# Patient Record
Sex: Female | Born: 1955 | Race: White | Hispanic: No | Marital: Married | Smoking: Never smoker
Health system: Southern US, Community
[De-identification: ages and names within clinical notes are randomized; demographics above are authoritative.]

## PROBLEM LIST (undated history)

## (undated) DIAGNOSIS — Z803 Family history of malignant neoplasm of breast: Secondary | ICD-10-CM

## (undated) DIAGNOSIS — C801 Malignant (primary) neoplasm, unspecified: Secondary | ICD-10-CM

## (undated) DIAGNOSIS — Z85850 Personal history of malignant neoplasm of thyroid: Secondary | ICD-10-CM

## (undated) DIAGNOSIS — E039 Hypothyroidism, unspecified: Secondary | ICD-10-CM

## (undated) DIAGNOSIS — G4700 Insomnia, unspecified: Secondary | ICD-10-CM

## (undated) DIAGNOSIS — E785 Hyperlipidemia, unspecified: Secondary | ICD-10-CM

## (undated) DIAGNOSIS — Z801 Family history of malignant neoplasm of trachea, bronchus and lung: Secondary | ICD-10-CM

## (undated) HISTORY — DX: Family history of malignant neoplasm of breast: Z80.3

## (undated) HISTORY — DX: Insomnia, unspecified: G47.00

## (undated) HISTORY — DX: Personal history of malignant neoplasm of thyroid: Z85.850

## (undated) HISTORY — DX: Hypothyroidism, unspecified: E03.9

## (undated) HISTORY — PX: THYROIDECTOMY, PARTIAL: SHX18

## (undated) HISTORY — DX: Family history of malignant neoplasm of trachea, bronchus and lung: Z80.1

## (undated) HISTORY — DX: Hyperlipidemia, unspecified: E78.5

---

## 1998-08-20 ENCOUNTER — Other Ambulatory Visit: Admission: RE | Admit: 1998-08-20 | Discharge: 1998-08-20 | Payer: Self-pay | Admitting: Obstetrics and Gynecology

## 1998-12-09 ENCOUNTER — Encounter: Payer: Self-pay | Admitting: Family Medicine

## 1998-12-09 ENCOUNTER — Ambulatory Visit (HOSPITAL_COMMUNITY): Admission: RE | Admit: 1998-12-09 | Discharge: 1998-12-09 | Payer: Self-pay | Admitting: Family Medicine

## 2000-02-26 ENCOUNTER — Other Ambulatory Visit: Admission: RE | Admit: 2000-02-26 | Discharge: 2000-02-26 | Payer: Self-pay | Admitting: Obstetrics and Gynecology

## 2001-05-19 ENCOUNTER — Other Ambulatory Visit: Admission: RE | Admit: 2001-05-19 | Discharge: 2001-05-19 | Payer: Self-pay | Admitting: Obstetrics and Gynecology

## 2002-08-21 ENCOUNTER — Other Ambulatory Visit: Admission: RE | Admit: 2002-08-21 | Discharge: 2002-08-21 | Payer: Self-pay | Admitting: Obstetrics and Gynecology

## 2003-12-20 ENCOUNTER — Other Ambulatory Visit: Admission: RE | Admit: 2003-12-20 | Discharge: 2003-12-20 | Payer: Self-pay | Admitting: Obstetrics and Gynecology

## 2004-09-02 ENCOUNTER — Ambulatory Visit: Payer: Self-pay | Admitting: Internal Medicine

## 2004-09-08 ENCOUNTER — Ambulatory Visit: Payer: Self-pay | Admitting: Internal Medicine

## 2005-02-23 ENCOUNTER — Ambulatory Visit: Payer: Self-pay | Admitting: Internal Medicine

## 2005-03-02 ENCOUNTER — Ambulatory Visit: Payer: Self-pay | Admitting: Internal Medicine

## 2005-07-29 ENCOUNTER — Other Ambulatory Visit: Admission: RE | Admit: 2005-07-29 | Discharge: 2005-07-29 | Payer: Self-pay | Admitting: Obstetrics and Gynecology

## 2005-12-02 ENCOUNTER — Ambulatory Visit: Payer: Self-pay | Admitting: Internal Medicine

## 2007-04-27 ENCOUNTER — Ambulatory Visit: Payer: Self-pay | Admitting: Internal Medicine

## 2007-04-27 LAB — CONVERTED CEMR LAB
ALT: 16 units/L (ref 0–35)
AST: 18 units/L (ref 0–37)
Albumin: 3.8 g/dL (ref 3.5–5.2)
Alkaline Phosphatase: 48 units/L (ref 39–117)
BUN: 8 mg/dL (ref 6–23)
Basophils Absolute: 0.1 10*3/uL (ref 0.0–0.1)
Basophils Relative: 1.4 % — ABNORMAL HIGH (ref 0.0–1.0)
Bilirubin, Direct: 0.1 mg/dL (ref 0.0–0.3)
CO2: 29 meq/L (ref 19–32)
Calcium: 9.3 mg/dL (ref 8.4–10.5)
Chloride: 106 meq/L (ref 96–112)
Cholesterol: 233 mg/dL (ref 0–200)
Creatinine, Ser: 0.7 mg/dL (ref 0.4–1.2)
Direct LDL: 164.5 mg/dL
Eosinophils Absolute: 0.2 10*3/uL (ref 0.0–0.6)
Eosinophils Relative: 3.7 % (ref 0.0–5.0)
GFR calc Af Amer: 114 mL/min
GFR calc non Af Amer: 95 mL/min
Glucose, Bld: 103 mg/dL — ABNORMAL HIGH (ref 70–99)
HCT: 40.8 % (ref 36.0–46.0)
HDL: 52.7 mg/dL (ref >39.0)
Hemoglobin: 14.3 g/dL (ref 12.0–15.0)
Lymphocytes Relative: 25.7 % (ref 12.0–46.0)
MCHC: 35 g/dL (ref 30.0–36.0)
MCV: 90.2 fL (ref 78.0–100.0)
Monocytes Absolute: 0.4 10*3/uL (ref 0.2–0.7)
Monocytes Relative: 6 % (ref 3.0–11.0)
Neutro Abs: 3.8 10*3/uL (ref 1.4–7.7)
Neutrophils Relative %: 63.2 % (ref 43.0–77.0)
Platelets: 211 10*3/uL (ref 150–400)
Potassium: 4.1 meq/L (ref 3.5–5.1)
RBC: 4.52 M/uL (ref 3.87–5.11)
RDW: 12.6 % (ref 11.5–14.6)
Sodium: 140 meq/L (ref 135–145)
TSH: 1.08 microintl units/mL (ref 0.35–5.50)
Total Bilirubin: 0.9 mg/dL (ref 0.3–1.2)
Total CHOL/HDL Ratio: 4.4
Total Protein: 7.2 g/dL (ref 6.0–8.3)
Triglycerides: 85 mg/dL (ref 0–149)
VLDL: 17 mg/dL (ref 0–40)
WBC: 6 10*3/uL (ref 4.5–10.5)

## 2007-05-02 ENCOUNTER — Ambulatory Visit: Payer: Self-pay | Admitting: Internal Medicine

## 2008-04-09 ENCOUNTER — Ambulatory Visit: Payer: Self-pay | Admitting: Internal Medicine

## 2008-04-09 DIAGNOSIS — E785 Hyperlipidemia, unspecified: Secondary | ICD-10-CM | POA: Insufficient documentation

## 2008-04-09 DIAGNOSIS — J329 Chronic sinusitis, unspecified: Secondary | ICD-10-CM | POA: Insufficient documentation

## 2008-04-09 DIAGNOSIS — E039 Hypothyroidism, unspecified: Secondary | ICD-10-CM | POA: Insufficient documentation

## 2008-04-09 HISTORY — DX: Hyperlipidemia, unspecified: E78.5

## 2008-04-09 HISTORY — DX: Hypothyroidism, unspecified: E03.9

## 2008-04-16 ENCOUNTER — Encounter: Payer: Self-pay | Admitting: Internal Medicine

## 2008-04-16 ENCOUNTER — Telehealth: Payer: Self-pay | Admitting: Internal Medicine

## 2008-04-16 ENCOUNTER — Ambulatory Visit: Payer: Self-pay | Admitting: Internal Medicine

## 2008-04-16 ENCOUNTER — Ambulatory Visit (HOSPITAL_BASED_OUTPATIENT_CLINIC_OR_DEPARTMENT_OTHER): Admission: RE | Admit: 2008-04-16 | Discharge: 2008-04-16 | Payer: Self-pay | Admitting: Internal Medicine

## 2008-04-16 DIAGNOSIS — R059 Cough, unspecified: Secondary | ICD-10-CM | POA: Insufficient documentation

## 2008-04-16 DIAGNOSIS — R05 Cough: Secondary | ICD-10-CM

## 2008-04-16 LAB — CONVERTED CEMR LAB
ALT: 18 units/L (ref 0–35)
AST: 27 units/L (ref 0–37)
Albumin: 4 g/dL (ref 3.5–5.2)
Alkaline Phosphatase: 52 units/L (ref 39–117)
BUN: 12 mg/dL (ref 6–23)
Basophils Absolute: 0.1 10*3/uL (ref 0.0–0.1)
Basophils Relative: 1.6 % (ref 0.0–3.0)
Bilirubin, Direct: 0.2 mg/dL (ref 0.0–0.3)
CO2: 31 meq/L (ref 19–32)
Calcium: 9.4 mg/dL (ref 8.4–10.5)
Chloride: 110 meq/L (ref 96–112)
Cholesterol: 258 mg/dL (ref 0–200)
Creatinine, Ser: 0.7 mg/dL (ref 0.4–1.2)
Direct LDL: 186.2 mg/dL
Eosinophils Absolute: 0.1 10*3/uL (ref 0.0–0.7)
Eosinophils Relative: 2.6 % (ref 0.0–5.0)
GFR calc Af Amer: 114 mL/min
GFR calc non Af Amer: 94 mL/min
Glucose, Bld: 106 mg/dL — ABNORMAL HIGH (ref 70–99)
HCT: 41.3 % (ref 36.0–46.0)
HDL: 45.9 mg/dL (ref >39.0)
Hemoglobin: 14.9 g/dL (ref 12.0–15.0)
Lymphocytes Relative: 21.8 % (ref 12.0–46.0)
MCHC: 36.1 g/dL — ABNORMAL HIGH (ref 30.0–36.0)
MCV: 91.3 fL (ref 78.0–100.0)
Monocytes Absolute: 0.3 10*3/uL (ref 0.1–1.0)
Monocytes Relative: 5.7 % (ref 3.0–12.0)
Neutro Abs: 4 10*3/uL (ref 1.4–7.7)
Neutrophils Relative %: 68.3 % (ref 43.0–77.0)
Platelets: 207 10*3/uL (ref 150–400)
Potassium: 4.6 meq/L (ref 3.5–5.1)
RBC: 4.52 M/uL (ref 3.87–5.11)
RDW: 11.9 % (ref 11.5–14.6)
Sodium: 142 meq/L (ref 135–145)
TSH: 0.92 microintl units/mL (ref 0.35–5.50)
Total Bilirubin: 1 mg/dL (ref 0.3–1.2)
Total CHOL/HDL Ratio: 5.6
Total Protein: 7.5 g/dL (ref 6.0–8.3)
Triglycerides: 120 mg/dL (ref 0–149)
VLDL: 24 mg/dL (ref 0–40)
WBC: 5.7 10*3/uL (ref 4.5–10.5)

## 2008-06-04 ENCOUNTER — Ambulatory Visit: Payer: Self-pay | Admitting: Gastroenterology

## 2008-06-19 ENCOUNTER — Ambulatory Visit: Payer: Self-pay | Admitting: Gastroenterology

## 2008-10-17 ENCOUNTER — Telehealth: Payer: Self-pay | Admitting: Internal Medicine

## 2009-04-19 ENCOUNTER — Telehealth: Payer: Self-pay | Admitting: Internal Medicine

## 2009-04-22 ENCOUNTER — Ambulatory Visit: Payer: Self-pay | Admitting: Internal Medicine

## 2009-04-22 LAB — CONVERTED CEMR LAB
ALT: 12 units/L (ref 0–35)
AST: 15 units/L (ref 0–37)
Albumin: 4.3 g/dL (ref 3.5–5.2)
Alkaline Phosphatase: 59 units/L (ref 39–117)
BUN: 14 mg/dL (ref 6–23)
Basophils Absolute: 0 10*3/uL (ref 0.0–0.1)
Basophils Relative: 0 % (ref 0–1)
Bilirubin, Direct: 0.1 mg/dL (ref 0.0–0.3)
CO2: 23 meq/L (ref 19–32)
CRP: 0 mg/dL (ref <0.6)
Calcium: 9.5 mg/dL (ref 8.4–10.5)
Chloride: 106 meq/L (ref 96–112)
Cholesterol: 209 mg/dL — ABNORMAL HIGH (ref 0–200)
Creatinine, Ser: 0.77 mg/dL (ref 0.40–1.20)
Eosinophils Absolute: 0.1 10*3/uL (ref 0.0–0.7)
Eosinophils Relative: 2 % (ref 0–5)
Glucose, Bld: 97 mg/dL (ref 70–99)
HCT: 43 % (ref 36.0–46.0)
HDL: 58 mg/dL (ref >39)
Hemoglobin: 13.9 g/dL (ref 12.0–15.0)
Indirect Bilirubin: 0.5 mg/dL (ref 0.0–0.9)
LDL Cholesterol: 135 mg/dL — ABNORMAL HIGH (ref 0–99)
Lymphocytes Relative: 28 % (ref 12–46)
Lymphs Abs: 1.5 10*3/uL (ref 0.7–4.0)
MCHC: 32.3 g/dL (ref 30.0–36.0)
MCV: 91.3 fL (ref 78.0–100.0)
Monocytes Absolute: 0.5 10*3/uL (ref 0.1–1.0)
Monocytes Relative: 9 % (ref 3–12)
Neutro Abs: 3.1 10*3/uL (ref 1.7–7.7)
Neutrophils Relative %: 61 % (ref 43–77)
Platelets: 163 10*3/uL (ref 150–400)
Potassium: 4.6 meq/L (ref 3.5–5.3)
RBC: 4.71 M/uL (ref 3.87–5.11)
RDW: 12.9 % (ref 11.5–15.5)
Sodium: 142 meq/L (ref 135–145)
TSH: 0.159 microintl units/mL — ABNORMAL LOW (ref 0.350–4.500)
Total Bilirubin: 0.6 mg/dL (ref 0.3–1.2)
Total CHOL/HDL Ratio: 3.6
Total Protein: 6.8 g/dL (ref 6.0–8.3)
Triglycerides: 82 mg/dL (ref <150)
VLDL: 16 mg/dL (ref 0–40)
Vit D, 1,25-Dihydroxy: 48 (ref 30–89)
WBC: 5.2 10*3/uL (ref 4.0–10.5)

## 2009-04-25 ENCOUNTER — Ambulatory Visit: Payer: Self-pay | Admitting: Internal Medicine

## 2009-07-24 ENCOUNTER — Ambulatory Visit: Payer: Self-pay | Admitting: Internal Medicine

## 2009-07-24 LAB — CONVERTED CEMR LAB: TSH: 0.546 microintl units/mL (ref 0.350–4.500)

## 2009-07-25 ENCOUNTER — Telehealth: Payer: Self-pay | Admitting: Internal Medicine

## 2009-08-05 ENCOUNTER — Encounter: Payer: Self-pay | Admitting: Internal Medicine

## 2009-08-05 LAB — CONVERTED CEMR LAB: Pap Smear: NORMAL

## 2010-01-21 ENCOUNTER — Encounter: Payer: Self-pay | Admitting: Internal Medicine

## 2010-01-22 ENCOUNTER — Encounter: Payer: Self-pay | Admitting: Internal Medicine

## 2010-01-22 LAB — CONVERTED CEMR LAB
Cholesterol: 235 mg/dL — ABNORMAL HIGH (ref 0–200)
HDL: 50 mg/dL (ref >39)
LDL Cholesterol: 168 mg/dL — ABNORMAL HIGH (ref 0–99)
TSH: 0.913 microintl units/mL (ref 0.350–4.500)
Total CHOL/HDL Ratio: 4.7
Triglycerides: 83 mg/dL (ref <150)
VLDL: 17 mg/dL (ref 0–40)

## 2010-01-27 ENCOUNTER — Telehealth: Payer: Self-pay | Admitting: Internal Medicine

## 2010-08-19 NOTE — Miscellaneous (Signed)
Summary: Lab Orders  Clinical Lists Changes  Orders: Added new Test order of T-Lipid Profile [phone]) - Signed Added new Test order of T-TSH [id number]) - Signed

## 2010-08-19 NOTE — Progress Notes (Signed)
Summary: Lab results  Phone Note Call from Patient Call back at Home Phone [phone]   Caller: Patient Reason for Call: Lab or Test Results Summary of Call: Pt inquired about lab results, pls call when avail  Initial call taken by: Lannette Donath,  January 27, 2010 12:05 PM  Follow-up for Phone Call        thyroid blood test is normal.  cholesterol is still elevated.   LDL - 168  continue same dose thyroid medication.  repeat TSH in 6 months.  244.9  she can consider taking red yeast rice supplement over the counter if she does not want to take statin Follow-up by: [name],  January 27, 2010 11:40 PM  Additional Follow-up for Phone Call Additional follow up Details #1::        spoke with patient gave her Dr Olegario Messier note above and advised patient her rx was sent to [facility]  January 28, 2010 8:06 AM    Prescriptions: SYNTHROID 88 MCG TABS (LEVOTHYROXINE SODIUM) one by mouth qd  #90 x 1   Entered and Authorized by:   [name]   Signed by:   [name] on 01/27/2010   Method used:   Electronically to        [facility] [address] (retail)       [phone]       [place], [place]  [address]       Ph: [phone]       Fax: [phone]   RxID:   [id number]

## 2010-08-19 NOTE — Progress Notes (Signed)
Summary: Declined additional blood work  ---- [facility] ---- ---- 10/16/2008 10:52 AM, [name] wrote: Have pt come in for TSH 244.90 and CRP  272.4 ------------------------------  Phone Note Outgoing Call   Call placed by: Glendell Docker CMA,  October 17, 2008 2:56 PM Summary of Call: called placed to patient informing patient that Dr Artist Pais advised her to recheck he thyroid  6 months from her September office visit, patient states she is no going to have that done, because he blood work is done yearly. Patient was reminded that at the [facility] appointmen a letter was sent to her to remind her about the blood work and again the patient states that she is not going to have the blood work done, and to please inform Dr Artist Pais of that  Initial call taken by: Glendell Docker CMA,  October 17, 2008 2:59 PM  Follow-up for Phone Call        noted. Follow-up by: [name],  October 17, 2008 9:56 PM

## 2010-08-19 NOTE — Miscellaneous (Signed)
Summary: DIR COLON-SCRNG-AGE/FH  Clinical Lists Changes  Medications: Added new medication of MIRALAX   POWD (POLYETHYLENE GLYCOL 3350) As directed - Signed Added new medication of REGLAN 10 MG  TABS (METOCLOPRAMIDE HCL) As directed - Signed Added new medication of DULCOLAX 5 MG  TBEC (BISACODYL) As directed - Signed Rx of MIRALAX   POWD (POLYETHYLENE GLYCOL 3350) As directed;  #255 x 0;  Signed;  Entered by: Clide Cliff RN;  Authorized by: Louis Meckel MD;  Method used: Electronically to [facility] [phone] [place], [place], [place]  [address], Ph: [phone], Fax: [phone] Rx of REGLAN 10 MG  TABS (METOCLOPRAMIDE HCL) As directed;  #2 x 0;  Signed;  Entered by: Clide Cliff RN;  Authorized by: Louis Meckel MD;  Method used: Electronically to [facility] [phone] [place], [place], [place]  [address], Ph: [phone], Fax: [phone] Rx of DULCOLAX 5 MG  TBEC (BISACODYL) As directed;  #4 x 0;  Signed;  Entered by: Clide Cliff RN;  Authorized by: Louis Meckel MD;  Method used: Electronically to [facility] [phone] [place], [place], [place]  [address], Ph: [phone], Fax: [phone] Observations: Added new observation of ALLERGY REV: Done (06/04/2008 14:36)    Prescriptions: DULCOLAX 5 MG  TBEC (BISACODYL) As directed  #4 x 0   Entered by:   Clide Cliff RN   Authorized by:   Louis Meckel MD   Signed by:   Clide Cliff RN on 06/04/2008   Method used:   Electronically to        [facility] [address] (retail)       [phone]       [place], [place]  [address]       Ph: [phone]       Fax: [phone]   RxID:   [id number] REGLAN 10 MG  TABS (METOCLOPRAMIDE HCL) As directed  #2 x 0   Entered by:   Clide Cliff RN   Authorized by:   Louis Meckel MD   Signed by:   Clide Cliff RN on 06/04/2008  Method used:   Electronically to        [facility] [address] (retail)       [phone]       [place], [place]  [address]       Ph: [phone]       Fax: [phone]   RxID:   [id number] MIRALAX   POWD (POLYETHYLENE GLYCOL 3350) As directed  #255 x 0   Entered by:   Clide Cliff RN   Authorized by:   Louis Meckel MD   Signed by:   Clide Cliff RN on 06/04/2008   Method used:   Electronically to        [facility] [address] (retail)       [phone]       [place], [place]  [address]       Ph: [phone]       Fax: [phone]   RxID:   [id number]

## 2010-08-19 NOTE — Progress Notes (Signed)
  Phone Note Call from Patient   Caller: Patient Summary of Call: Time for annual Thyroid check and Cholestrol & patient wants to make a lab appt. for these and any others Dr. Artist Pais thinks she made need. Will you let me know what labs to order for her so I can make that appt. ? I will call her back at her work number below that she left for [place] to call her and set a appt. time. Thank you, Michaelle Copas [phone] Initial call taken by: Michaelle Copas,  April 19, 2009 8:45 AM  Follow-up for Phone Call        schedule following labs BMP prior to visit, ICD-9:  v70 Hepatic Panel prior to visit, ICD-9: 272.4 Lipid Panel prior to visit, ICD-9:  272.4 TSH prior to visit, ICD-9: 272.4 CBCD:  272.4 CRP:  272.4 vitamin D level:  v70 Follow-up by: [name],  April 19, 2009 2:40 PM  Additional Follow-up for Phone Call Additional follow up Details #1::        made lab appt. with pt. Additional Follow-up by: Michaelle Copas,  April 19, 2009 4:04 PM

## 2010-08-19 NOTE — Progress Notes (Signed)
Summary: Thyroid Results  Phone Note Outgoing Call   Summary of Call: call pt - thyroid blood test is normal.  continue same dose of thyroid medication.  repeat TSH in 6 months.  244.90 Initial call taken by: [name],  July 25, 2009 1:53 PM  Follow-up for Phone Call        attempted to contact patient at [phone] no answer, voice message left to return call Follow-up by: Glendell Docker CMA,  July 26, 2009 12:44 PM  Additional Follow-up for Phone Call Additional follow up Details #1::        patient advised per Dr Artist Pais instructions. She states that she will call back to schedule follow up lab Additional Follow-up by: Glendell Docker CMA,  July 29, 2009 11:55 AM    Prescriptions: SYNTHROID 88 MCG TABS (LEVOTHYROXINE SODIUM) one by mouth qd  #90 x 1   Entered by:   Glendell Docker CMA   Authorized by:   [name]   Signed by:   Glendell Docker CMA on 07/29/2009   Method used:   Electronically to        [facility] [address] [place] (retail)       [phone]       [place], [place]  [address]       Ph: [phone]       Fax: [phone]   RxID:   [id number]

## 2010-08-19 NOTE — Letter (Signed)
    at [facility] [address] [place], [place]  [address]  [place] Phone: [phone]      April 16, 2008   [facility] [address] [place], [place] [address]  RE:  LAB RESULTS  Dear  Ms. Toole,  The following is an interpretation of your most recent lab tests.  Please take note of any instructions provided or changes to medications that have resulted from your lab work.  ELECTROLYTES:  Good - no changes needed  KIDNEY FUNCTION TESTS:  Good - no changes needed  LIVER FUNCTION TESTS:  Good - no changes needed  LIPID PANEL:  Fair - review at your next visit Triglyceride: 120   Cholesterol: 258   LDL: DEL   HDL: 45.9   Chol/HDL%:  5.6 CALC  THYROID STUDIES:  Thyroid studies normal TSH: 0.92     CBC:  Good - no changes needed    Please follow low saturated fat diet and take fish oil capsules twice daily.   ([place] enteric coated - Max strength).   I recommend we repeat your thyroid function blood test in 6 months.   Sincerely Yours,    Dr. Thomos Lemons

## 2010-08-19 NOTE — Progress Notes (Signed)
Summary: Cough & Chest Congestion  Phone Note Call from Patient Call back at [phone]   Caller: Patient Call For: [name] Summary of Call: patient called and left voice message stating she was seen on last week Monday for URI and she was given antibiotics. After speaking with patient she states her symptoms have improved very little. She sates she still has a cough, chest congestion that is now moving to her head. She denies temperature, and productive cough. She is requesting another round of antibiotics. Initial call taken by: Glendell Docker CMA,  April 16, 2008 9:16 AM  Follow-up for Phone Call        She may have viral infection.  Before another round of abx,  I would rec OV Follow-up by: [name],  April 16, 2008 11:19 AM  Additional Follow-up for Phone Call Additional follow up Details #1::        Patient informed per Dr Artist Pais instructions office visit scheduled for 04/16/08 Additional Follow-up by: Glendell Docker CMA,  April 16, 2008 1:56 PM

## 2010-08-19 NOTE — Assessment & Plan Note (Signed)
Summary: ROV-CH   Vital Signs:  Patient Profile:   55 Years Old Female Height:     66 inches Weight:      127.25 pounds BMI:     20.61 Temp:     98.5 degrees F oral Pulse rate:   87 / minute Pulse rhythm:   regular Resp:     18 per minute BP sitting:   93 / 68  (left arm) Cuff size:   regular  Vitals Entered By: Glendell Docker CMA (April 09, 2008 2:12 PM)                 Chief Complaint:  Cough and URI symptoms.  History of Present Illness:    URI Symptoms      The patient also presents with URI symptoms.  The patient reports nasal congestion, clear nasal discharge, sore throat, and productive cough.  Associated symptoms include low-grade fever (<100.5 degrees).  The patient denies seasonal symptoms, muscle aches, and severe fatigue.      Current Allergies (reviewed today): No known allergies   Past Medical History:    Hypothyroidism    Hyperlipidemia - does not want to take statins   Family History:    DM - Father    Htn - Father  Social History:    Married    Never Smoked    Alcohol use-yes (social)    Occupation:  Part time for [employer]   Risk Factors:  Tobacco use:  never Alcohol use:  no   Review of Systems      See HPI   Physical Exam  General:     alert, well-developed, and well-nourished.   Head:     normocephalic and atraumatic.   Ears:     R ear normal and L ear normal.   Mouth:     pharyngeal erythema and postnasal drip.   Neck:     supple and no masses.   Lungs:     normal respiratory effort, normal breath sounds, no crackles, and no wheezes.   Heart:     normal rate, regular rhythm, and no gallop.      Impression & Recommendations:  Problem # 1:  RHINOSINUSITIS, ACUTE (ICD-461.8) Instructed on treatment. Call if symptoms persist or worsen.   Her updated medication list for this problem includes:    Cefuroxime Axetil 500 Mg Tabs (Cefuroxime axetil) ..... One by mouth two times a day    Benzonatate 100 Mg Caps  (Benzonatate) ..... One by mouth three times a day prn   Complete Medication List: 1)  Synthroid Mcg Tabs (levothyroxine Sodium)  .... Take 1 tablet by mouth once a day 2)  Cefuroxime Axetil 500 Mg Tabs (Cefuroxime axetil) .... One by mouth two times a day 3)  Benzonatate 100 Mg Caps (Benzonatate) .... One by mouth three times a day prn   Patient Instructions: 1)  Please schedule a follow-up appointment in October for CPX. 2)  BMP prior to visit, ICD-9:  244.9 3)  Hepatic Panel prior to visit, ICD-9: 272.4 4)  Lipid Panel prior to visit, ICD-9: 272.4 5)  TSH prior to visit, ICD-9:  244.9 6)  CBC w/ Diff prior to visit, ICD-9: v70 7)  Please return for lab work one (1) week before your next appointment.    Prescriptions: BENZONATATE 100 MG CAPS (BENZONATATE) one by mouth three times a day prn  #30 x 0   Entered and Authorized by:   [name]  Signed by:   [name] on 04/09/2008   Method used:   Electronically to        [facility] [phone]* (retail)       [address]       [place], [place]  [address]       Ph: [phone] or [phone]       Fax: [phone]   RxID:   [id number] CEFUROXIME AXETIL 500 MG TABS (CEFUROXIME AXETIL) one by mouth two times a day  #14 x 0   Entered and Authorized by:   [name]   Signed by:   [name] on 04/09/2008   Method used:   Electronically to        [facility] [phone]* (retail)       [address]       [place], [place]  [address]       Ph: [phone] or [phone]       Fax: [phone]   RxID:   [id number]  ] Current Allergies (reviewed today): No known allergies      Preventive Care Screening  Last Flu Shot:    Date:  04/09/2008    Results:  Declined

## 2010-08-19 NOTE — Assessment & Plan Note (Signed)
Summary: URI/DK   Vital Signs:  Patient Profile:   55 Years Old Female Height:     66 inches Temp:     97.9 degrees F oral Pulse rate:   86 / minute Pulse rhythm:   regular Resp:     18 per minute BP sitting:   125 / 85  (left arm) Cuff size:   regular  Vitals Entered By: Glendell Docker CMA (April 16, 2008 3:56 PM)                 Chief Complaint:  c/o unresolved cough and Cough.  History of Present Illness:  Cough      This is a 55 year old woman who presents with persistent Cough.  The patient reports non-productive cough and pleuritic chest pain, but denies fever.  No improvement with Ceftin.  She denies fever.      Current Allergies (reviewed today): No known allergies    Social History:    Married    Never Smoked    Alcohol use-yes (social)    Occupation:  Part time for [employer]     Review of Systems  The patient denies fever, dyspnea on exertion, and abdominal pain.     Physical Exam  General:     alert, well-developed, and well-nourished.   Head:     normocephalic and atraumatic.   Eyes:     vision grossly intact, pupils equal, pupils round, and pupils reactive to light.   Ears:     R ear normal and L ear normal.   Neck:     supple and no masses.   Lungs:     normal respiratory effort, normal breath sounds, and no wheezes.   Heart:     normal rate, regular rhythm, and no gallop.   Extremities:     No lower extremity edema  Neurologic:     alert & oriented X3, cranial nerves II-XII intact, and gait normal.   Skin:     turgor normal and color normal.   Psych:     normally interactive, good eye contact, not anxious appearing, and not depressed appearing.      Impression & Recommendations:  Problem # 1:  COUGH (ICD-786.2) Pt with persistent cough x 3 wks.  CXR negative.  ? mycoplasma vs post viral cough.  Azithromycin 500 mg x 3 days with prednisone taper. Patient advised to call office if symptoms persist or worsen.  Orders: T-2  View CXR, Same Day (71020.5TC)   Problem # 2:  HYPOTHYROIDISM (ICD-244.9) TSH @ goal.  Maintain current medication regimen.  Her updated medication list for this problem includes:    Synthroid 100 Mcg Tabs (Levothyroxine sodium) ..... One by mouth once daily  Labs Reviewed: TSH: 0.92 (04/16/2008)    Chol: 258 (04/16/2008)   HDL: 45.9 (04/16/2008)   LDL: DEL (04/16/2008)   TG: 120 (04/16/2008)   Problem # 3:  HYPERLIPIDEMIA (ICD-272.4) LDL suboptimal.  Pt still refuses statin therapy.   I urged low sat fat diet. Labs Reviewed: Chol: 258 (04/16/2008)   HDL: 45.9 (04/16/2008)   LDL: DEL (04/16/2008)   TG: 120 (04/16/2008) SGOT: 27 (04/16/2008)   SGPT: 18 (04/16/2008)   Complete Medication List: 1)  Synthroid 100 Mcg Tabs (Levothyroxine sodium) .... One by mouth once daily 2)  Azithromycin 250 Mg Tabs (Azithromycin) .... 2 tabs by mouth once daily 3)  Prednisone 10 Mg Tabs (Prednisone) .... 3 tabs by mouth once daily x 3 days, 2 tabs by  mouth once daily x 3 days, 1 tab by mouth once daily x 3 days 4)  Tussionex Pennkinetic Er 8-10 Mg/43ml Lqcr (Chlorpheniramine-hydrocodone) .... 5 ml q 12 hrs as needed.    Prescriptions: SYNTHROID 100 MCG TABS (LEVOTHYROXINE SODIUM) one by mouth once daily  #90 x 3   Entered and Authorized by:   [name]   Signed by:   [name] on 04/16/2008   Method used:   Electronically to        [facility] [address] [place] (retail)       [phone]       [place], [place]  [address]       Ph: [phone]       Fax: [phone]   RxID:   [id number] Sandria Senter ER 8-10 MG/5ML LQCR (CHLORPHENIRAMINE-HYDROCODONE) 5 ml q 12 hrs as needed.  #60 ml x 0   Entered and Authorized by:   [name]   Signed by:   [name] on 04/16/2008   Method used:   Print then Give to Patient   RxID:   [id number] AZITHROMYCIN 250 MG TABS (AZITHROMYCIN) 2 tabs by mouth once daily  #6 x 0   Entered  and Authorized by:   [name]   Signed by:   [name] on 04/16/2008   Method used:   Electronically to        [facility] [address] [place] (retail)       [phone]       [place], [place]  [address]       Ph: [phone]       Fax: [phone]   RxID:   [id number] PREDNISONE 10 MG TABS (PREDNISONE) 3 tabs by mouth once daily x 3 days, 2 tabs by mouth once daily x 3 days, 1 tab by mouth once daily x 3 days  #18 x 0   Entered and Authorized by:   [name]   Signed by:   [name] on 04/16/2008   Method used:   Electronically to        [facility] [address] [place] (retail)       [phone]       [place], [place]  [address]       Ph: [phone]       Fax: [phone]   RxID:   [id number] AZITHROMYCIN 250 MG TABS (AZITHROMYCIN) 2 tabs by mouth once daily  #6 x 0   Entered and Authorized by:   [name]   Signed by:   [name] on 04/16/2008   Method used:   Electronically to        [facility] [phone]* (retail)       [address]       [place], [place]  [address]       Ph: [phone] or [phone]       Fax: [phone]   RxID:   [id number]  ] Current Allergies (reviewed today): No known allergies

## 2010-08-19 NOTE — Assessment & Plan Note (Signed)
Summary: cpx- per Dr.Yoo- jr   Vital Signs:  Patient profile:   55 year old female Weight:      127.50 pounds BMI:     20.65 Temp:     98.4 degrees F oral Pulse rate:   64 / minute Pulse rhythm:   regular Resp:     16 per minute BP sitting:   90 / 60  (right arm) Cuff size:   regular  Vitals Entered By: Glendell Docker CMA (April 25, 2009 8:42 AM)  Primary Care Provider:  Dondra Spry DO   History of Present Illness: 55 y/o for routine CPX.  No sign interval med hx.  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   she has been drinking more frapachinos.  She was worried it might raise her cholesterol.  she is still working PT.   Her husband is older is "fully" retired.  He stays very active.  Preventive Screening-Counseling & Management  Alcohol-Tobacco     Alcohol drinks/day: 2 per weekend     Alcohol type: all     Alcohol Counseling: not indicated; use of alcohol is not excessive or problematic     Smoking Status: never     Tobacco Counseling: not indicated; no tobacco use  Caffeine-Diet-Exercise     Caffeine use/day: 1 beverage daily     Caffeine Counseling: not indicated; caffeine use is not excessive or problematic     Does Patient Exercise: no     Times/week: 5  Allergies (verified): No Known Drug Allergies  Past History:  Past Medical History: Hypothyroidism Hyperlipidemia - does not want to take statins  Diverticulosis  Family History: DM - Father Htn - Father   Social History: Married Never Smoked Alcohol use-yes (social) Occupation:  Part time for [employer]   Caffeine use/day:  1 beverage daily Does Patient Exercise:  no  Review of Systems  The patient denies fever, weight loss, weight gain, chest pain, dyspnea on exertion, prolonged cough, abdominal pain, melena, hematochezia, and severe indigestion/heartburn.    Physical Exam  General:  alert, well-developed, and well-nourished.   Head:  normocephalic and atraumatic.   Eyes:  vision grossly  intact, pupils equal, pupils round, and pupils reactive to light.   Ears:  R ear normal and L ear normal.   Mouth:  Oral mucosa and oropharynx without lesions or exudates.  Teeth in good repair. Neck:  No deformities, masses, or tenderness noted. Lungs:  normal respiratory effort and normal breath sounds.   Heart:  normal rate, regular rhythm, no murmur, and no gallop.   Abdomen:  soft, non-tender, normal bowel sounds, no hepatomegaly, and no splenomegaly.   Extremities:  No lower extremity edema  Neurologic:  cranial nerves II-XII intact and gait normal.   Psych:  normally interactive, good eye contact, not anxious appearing, and not depressed appearing.     Impression & Recommendations:  Problem # 1:  HEALTH MAINTENANCE EXAM (ICD-V70.0) Reviewed adult health maintenance protocols.  Orders: EKG w/ Interpretation ([address])  Colonoscopy: Location:  [facility].   (06/19/2008) Bone Density: normal (05/12/2005) Td Booster: Tdap (10/06/2006)   Flu Vax: Declined (04/25/2009)   Chol: 209 (04/22/2009)   HDL: 58 (04/22/2009)   LDL: 135 (04/22/2009)   TG: 82 (04/22/2009) TSH: 0.159 (04/22/2009)   Next Colonoscopy due:: 06/2018 (06/19/2008)  Problem # 2:  HYPOTHYROIDISM (ICD-244.9) Decrease synthroid to 88 micrograms.  TSH in 2 months. Her updated medication list for this problem includes:    Synthroid 88 Mcg  Tabs (Levothyroxine sodium) ..... One by mouth qd  Labs Reviewed: TSH: 0.159 (04/22/2009)    Chol: 209 (04/22/2009)   HDL: 58 (04/22/2009)   LDL: 135 (04/22/2009)   TG: 82 (04/22/2009)  Complete Medication List: 1)  Synthroid 88 Mcg Tabs (Levothyroxine sodium) .... One by mouth qd 2)  Fish Oil 1000 Mg Caps (Omega-3 fatty acids) .... Take 1 capsule by mouth two times a day 3)  Vitamin D 1000 Unit Tabs (Cholecalciferol) .... Take 1 tablet by mouth two times a day 4)  Vitamin C 1000 Mg Tabs (Ascorbic acid) .... Take 1 tablet by mouth once a day  Patient Instructions: 1)   Please schedule a follow-up appointment in 1 year. 2)  TSH prior to visit, ICD-9:  244.90 3)  Please return for lab work one (1) week before your next appointment.  Prescriptions: SYNTHROID 88 MCG TABS (LEVOTHYROXINE SODIUM) one by mouth qd  #90 x 1   Entered and Authorized by:   [name]   Signed by:   [name] on 04/25/2009   Method used:   Electronically to        [facility] [address] [place] (retail)       [phone]       [place], [place]  [address]       Ph: [phone]       Fax: [phone]   RxID:   [id number]   Current Allergies (reviewed today): No known allergies    Preventive Care Screening  Last Tetanus Booster:    Date:  10/06/2006    Results:  Tdap   Bone Density:    Date:  05/12/2005    Results:  normal std dev    Immunization History:  Influenza Immunization History:    Influenza:  declined (04/25/2009)

## 2010-08-19 NOTE — Procedures (Signed)
Summary: Colonoscopy   Colonoscopy  Procedure date:  06/19/2008  Findings:      Location:  [facility].    Procedures Next Due Date:    Colonoscopy: 06/2018  Patient Name: Amanda Pena, Amanda Pena. MRN:  Procedure Procedures: Colonoscopy CPT: [phone].  Personnel: Endoscopist: Barbette Hair. Arlyce Dice, MD.  Exam Location: Outpatient  Patient Consent: Procedure, Alternatives, Risks and Benefits discussed, consent obtained, from patient.  Indications  Average Risk Screening Routine.  History  Current Medications: Patient is not currently taking Coumadin.  Pre-Exam Physical: Performed Jun 19, 2008. Cardio-pulmonary exam, Abdominal exam, Mental status exam WNL.  Comments: Patient history reviewed/updated, physical performed prior to initiation of sedation? Exam Exam: Extent of exam reached: Cecum, extent intended: Cecum.  The cecum was identified by appendiceal orifice and IC valve. Time to Cecum: 00:02: 53. Time for Withdrawl: 00:05:16. Colon retroflexion performed. ASA Classification: I. Tolerance: good.  Monitoring: Pulse and BP monitoring, Oximetry used. Supplemental O2 given. at 2 Liters.  Colon Prep Used Miralax for colon prep. Prep results: excellent.  Sedation Meds: Patient assessed and found to be appropriate for moderate (conscious) sedation. Sedation was managed by the Endoscopist. Fentanyl 50 mcg. given IV. Versed 5 mg. given IV.  Findings - NORMAL EXAM: Cecum to Sigmoid Colon.  NORMAL EXAM: Cecum.  - DIVERTICULOSIS: Sigmoid Colon. ICD9: Diverticulosis: 562.10. Comments: Few diverticula.  - NORMAL EXAM: Sigmoid Colon.  HEMORRHOIDS: Internal. ICD9: Hemorrhoids, Internal: 455.0.   Assessment Abnormal examination, see findings above.  Diagnoses: 562.10: Diverticulosis.  455.0: Hemorrhoids, Internal.   Events  Unplanned Interventions: No intervention was required.  Unplanned Events: There were no complications. Plans  Post Exam  Instructions: Post sedation instructions given.  Patient Education: Patient given standard instructions for: Diverticulosis. Hemorrhoids.  Disposition: After procedure patient sent to recovery. After recovery patient sent home.  Scheduling/Referral: Colonoscopy, to Barbette Hair. Arlyce Dice, MD, around Jun 19, 2018.    cc: Thomos Lemons, MD     Richardean Chimera, MD   This report was created from the original endoscopy report, which was reviewed and signed by the above listed endoscopist.

## 2010-08-21 ENCOUNTER — Encounter: Payer: Self-pay | Admitting: Internal Medicine

## 2010-08-21 ENCOUNTER — Encounter (INDEPENDENT_AMBULATORY_CARE_PROVIDER_SITE_OTHER): Payer: BC Managed Care – PPO | Admitting: Internal Medicine

## 2010-08-21 DIAGNOSIS — G4700 Insomnia, unspecified: Secondary | ICD-10-CM

## 2010-08-21 DIAGNOSIS — Z85850 Personal history of malignant neoplasm of thyroid: Secondary | ICD-10-CM | POA: Insufficient documentation

## 2010-08-21 DIAGNOSIS — Z0000 Encounter for general adult medical examination without abnormal findings: Secondary | ICD-10-CM

## 2010-08-21 HISTORY — DX: Personal history of malignant neoplasm of thyroid: Z85.850

## 2010-08-21 HISTORY — DX: Insomnia, unspecified: G47.00

## 2010-08-21 LAB — CONVERTED CEMR LAB
BUN: 14 mg/dL (ref 6–23)
CO2: 28 meq/L (ref 19–32)
CRP, High Sensitivity: 0.3
Calcium: 9.9 mg/dL (ref 8.4–10.5)
Chloride: 103 meq/L (ref 96–112)
Cholesterol: 259 mg/dL — ABNORMAL HIGH (ref 0–200)
Creatinine, Ser: 0.81 mg/dL (ref 0.40–1.20)
Glucose, Bld: 96 mg/dL (ref 70–99)
HDL: 57 mg/dL (ref >39)
LDL Cholesterol: 180 mg/dL — ABNORMAL HIGH (ref 0–99)
Potassium: 4.4 meq/L (ref 3.5–5.3)
Sodium: 142 meq/L (ref 135–145)
TSH: 0.487 microintl units/mL (ref 0.350–4.500)
Total CHOL/HDL Ratio: 4.5
Triglycerides: 108 mg/dL (ref <150)
VLDL: 22 mg/dL (ref 0–40)
Vit D, 1,25-Dihydroxy: 62 (ref 30–89)

## 2010-08-22 ENCOUNTER — Encounter: Payer: Self-pay | Admitting: Internal Medicine

## 2010-08-22 ENCOUNTER — Telehealth: Payer: Self-pay | Admitting: Internal Medicine

## 2010-08-26 ENCOUNTER — Telehealth: Payer: Self-pay | Admitting: Internal Medicine

## 2010-08-27 NOTE — Progress Notes (Signed)
Summary: additional lab needed, refill synthroid  Phone Note Outgoing Call Call back at [facility] Phone [phone]   Call placed by: Mervin Kung, CMA (AAMA) Call placed to: Patient Summary of Call: Per Dr Artist Pais, pt needs to return to the lab for additional thyroid testing. Lab was unable to add test to yesterday's specimen. Left message on machine for pt to return my call.  Initial call taken by: Mervin Kung CMA Duncan Dull),  August 22, 2010 9:16 AM  Follow-up for Phone Call        Pt advised of additional test needed. Order has been faxed to the lab. Pt requests refill on Synthroid. Follow-up by: Mervin Kung CMA Duncan Dull),  August 22, 2010 2:24 PM    Prescriptions: SYNTHROID 88 MCG TABS (LEVOTHYROXINE SODIUM) Take 1 tablet by mouth once a day Brand medically necessary #30 x 3   Entered by:   Mervin Kung CMA (AAMA)   Authorized by:   [name]   Signed by:   Mervin Kung CMA (AAMA) on 08/22/2010   Method used:   Electronically to        [facility] [address] (retail)       [phone]       [place], [place]  [address]       Ph: [phone]       Fax: [phone]   RxID:   [id number]

## 2010-08-27 NOTE — Assessment & Plan Note (Signed)
Summary: CPE OUT OF MEDS PATIENT FASTING/[facility]   Vital Signs:  Patient profile:   55 year old female Height:      66 inches Weight:      128.25 pounds BMI:     20.77 O2 Sat:      100 % on Room air Temp:     98.2 degrees F oral Pulse rate:   64 / minute Resp:     16 per minute BP sitting:   90 / 60  (right arm) Cuff size:   regular  Vitals Entered By: Glendell Docker CMA (August 21, 2010 8:19 AM)  O2 Flow:  Room air CC: CPX Is Patient Diabetic? No Pain Assessment Patient in pain? no      Comments fasting for blood work   Primary Care Provider:  [name]  CC:  CPX.  History of Present Illness: 55 y/o female  for routine cpx no sign int med hx stays active.  walks daily ([place] her house) gets PAP Pelvis with her GYN  not sleeping very well -  attributes to menopause no struggle with getting to sleep but can't stay asleep awakens q 2-3 hrs mild hot flashes  Current Diet: Breakfast: peanut butter on thin bread, starbuck (frapaccino) Lunch:  sandwiches (ham), yogurt, apple DInner: vegetables, cereal. Snacks: Beverage: 3 per week   Preventive Screening-Counseling & Management  Alcohol-Tobacco     Alcohol drinks/day: <1     Smoking Status: never  Caffeine-Diet-Exercise     Caffeine use/day: 3 times weekly     Does Patient Exercise: yes     Times/week: 7  Allergies (verified): No Known Drug Allergies  Past History:  Risk Factors: Alcohol Use: <1 (08/21/2010) Caffeine Use: 3 times weekly (08/21/2010) Exercise: yes (08/21/2010)  Past Medical History: Hx of thyroid cancer s/p partial thyroidectomy Iatrogenic Hypothyroidism Hyperlipidemia - does not want to take statins  Diverticulosis    Family History: DM - Father Htn - Father, mother  Social History: Caffeine use/day:  3 times weekly Does Patient Exercise:  yes  Review of Systems  The patient denies weight loss, weight gain, chest pain, dyspnea on exertion,  abdominal pain, severe indigestion/heartburn, and depression.    Physical Exam  General:  alert.  pleasant,  thin / slender Head:  normocephalic and atraumatic.   Eyes:  pupils equal, pupils round, and pupils reactive to light.   Ears:  R ear normal and L ear normal.   Mouth:  good dentition and pharynx pink and moist.   Neck:  No deformities, masses, or tenderness noted.no carotid bruits.   Lungs:  normal respiratory effort and normal breath sounds.   Heart:  normal rate, regular rhythm, and no gallop.   Abdomen:  soft, non-tender, normal bowel sounds, no masses, no hepatomegaly, and no splenomegaly.   Extremities:  No lower extremity edema  Neurologic:  cranial nerves II-XII intact and gait normal.     Impression & Recommendations:  Problem # 1:  HEALTH MAINTENANCE EXAM (ICD-V70.0)  Orders: EKG w/ Interpretation ([address])  Mammogram: normal (08/05/2009) Pap smear: normal (08/05/2009) Colonoscopy: Location:  [facility].   (06/19/2008) Bone Density: normal (05/12/2005) Td Booster: Tdap (10/06/2006)   Flu Vax: Declined (08/21/2010)   Chol: 235 (01/22/2010)   HDL: 50 (01/22/2010)   LDL: 168 (01/22/2010)   TG: 83 (01/22/2010) TSH: 0.913 (01/22/2010)   Next Colonoscopy due:: 06/2018 (06/19/2008)  Problem # 2:  HYPOTHYROIDISM (ICD-244.9)  Her updated medication list for this problem  includes:    Synthroid 88 Mcg Tabs (Levothyroxine sodium) .Marland Kitchen... Take 1 tablet by mouth once a day  Orders: T-TSH ([address]) T- * Misc. Laboratory test [id number])  Problem # 3:  INSOMNIA, CHRONIC (ICD-307.42) symptoms attributable to menopausal symptoms  trial of low dose gabapentin consider zolpidem if persistent symptoms  Complete Medication List: 1)  Synthroid 88 Mcg Tabs (Levothyroxine sodium) .... Take 1 tablet by mouth once a day 2)  Fish Oil 1000 Mg Caps (Omega-3 fatty acids) .... Take 1 capsule by mouth two times a day 3)  Vitamin D 1000 Unit Tabs (Cholecalciferol) .... Take  1 tablet by mouth two times a day 4)  Vitamin C 1000 Mg Tabs (Ascorbic acid) .... Take 1 tablet by mouth once a day 5)  Gabapentin 100 Mg Caps (Gabapentin) .... One by mouth at bedtime  Other Orders: T-Basic Metabolic Panel [id number]) T-Lipid Profile [id number]) CRP, high sensitivity-[facility] [phone])  Preventive Care Screening  Mammogram:    Date:  08/05/2009    Results:  normal   Pap Smear:    Date:  08/05/2009    Results:  normal    Patient Instructions: 1)  Please schedule a follow-up appointment in 1 year. 2)  Please schedule a follow-up appointment as needed. Prescriptions: GABAPENTIN 100 MG CAPS (GABAPENTIN) one by mouth at bedtime  #30 x 1   Entered and Authorized by:   [name]   Signed by:   [name] on 08/21/2010   Method used:   Electronically to        [facility] [address] (retail)       [phone]       [place], [place]  [address]       Ph: [phone]       Fax: [phone]   RxID:   [id number]    Orders Added: 1)  EKG w/ Interpretation [93[address]] 2)  T-Basic Metabolic Panel [[id number]] 3)  T-Lipid Profile [[id number]] 4)  CRP, high sensitivity-[facility] [[id number]] 5)  T-TSH [[id number]] 6)  T- * Misc. Laboratory test [99999] 7)  T- * Misc. Laboratory test [99999] 8)  Est. Patient 40-64 years [[address]]   Immunization History:  Influenza Immunization History:    Influenza:  declined (08/21/2010)   Immunization History:  Influenza Immunization History:    Influenza:  Declined (08/21/2010)   Preventive Care Screening  Mammogram:    Date:  08/05/2009    Results:  normal   Pap Smear:    Date:  08/05/2009    Results:  normal     Current Allergies (reviewed today): No known allergies

## 2010-08-27 NOTE — Letter (Signed)
   [facility] [address] [place], [place]  [address]  [place] Phone: [phone]      August 22, 2010   [facility] [address] [place], [place] [address]  RE:  LAB RESULTS  Dear  Ms. Terlizzi,  The following is an interpretation of your most recent lab tests.  Please take note of any instructions provided or changes to medications that have resulted from your lab work.  ELECTROLYTES:  Good - no changes needed  KIDNEY FUNCTION TESTS:  Good - no changes needed  LIPID PANEL:  Stable - no changes needed Triglyceride: 108   Cholesterol: 259   LDL: 180   HDL: 57   Chol/HDL%:  4.5 Ratio  THYROID STUDIES:  Thyroid studies normal TSH: 0.487          Sincerely Yours,    Dr. Thomos Lemons  Appended Document:  mailed

## 2010-09-04 NOTE — Progress Notes (Addendum)
Summary: declines additional lab, rx quantity correction  Phone Note Call from Patient Call back at Home Phone [phone]   Caller: Patient Call For: [name] Summary of Call: Pt called stating she is not going to return for Thyroglobulin test at this time due to finances. Ins. has high deductible and pt wanted Dr Artist Pais to be aware. Also states she needs Synthroid to be #90 x 3 refills. Rx was sent for 30 x 3 refills. Is this ok to change? Initial call taken by: Mervin Kung CMA Duncan Dull),  August 26, 2010 4:41 PM  Follow-up for Phone Call        ok to change to # 90 with 3 refills I would not wait year to have thyroglobulin I suggest pt return within  3 months to have test completed Follow-up by: [name],  August 26, 2010 6:14 PM  Additional Follow-up for Phone Call Additional follow up Details #1::        Left message on pharmacy voicemail ok to change quantity to #90 x 3 refills. Left message on pt home phone re: refill status and need to have additional test completed within 3 months and to call with any questions. Order has been faxed to the lab. Additional Follow-up by: Mervin Kung CMA Duncan Dull),  August 27, 2010 1:46 PM     Appended Document: declines additional lab, rx quantity correction Pt does not understand why she needs to have test done before next year? I advised pt it is a monitoring test due to her history of thyroid malignancy and pt does not understand why it needs to be checked now. Is there an immediate concern? What will test tell us? Please advise.  Appended Document: declines additional lab, rx quantity correction unless she is followed by endocrinologist re:  hx of thyroid cancer,  I suggest we obtain this blood test.  there was no worrisome findings on last examination of neck.  however, I still think we should follow this blood test.    Thomos Lemons DO 08/28/2010 3:21 PM  Appended Document: declines additional lab, rx quantity  correction Pt has been informed per Dr Olegario Messier recommendation and declines further testing due to cost concerns.  Appended Document: declines additional lab, rx quantity correction Pt called today stating she is going to go ahead and have thyroglobulin test performed at this time. Verified with Katrina that order has been received in the lab and pt reports that she will have drawn on Monday, 09/08/10.

## 2010-09-11 LAB — CONVERTED CEMR LAB: Thyroglobulin Ab: <20 (ref <40.0)

## 2010-09-16 ENCOUNTER — Telehealth: Payer: Self-pay | Admitting: Internal Medicine

## 2010-09-25 NOTE — Progress Notes (Signed)
Summary: Lab Results  Phone Note Outgoing Call   Summary of Call: call pt - thyroglobulin level is normal Initial call taken by: [name],  September 16, 2010 5:24 PM  Follow-up for Phone Call        call placed to patient at [phone], she was informed per Dr Artist Pais instructions Follow-up by: Glendell Docker CMA,  September 17, 2010 9:20 AM

## 2011-10-21 ENCOUNTER — Telehealth: Payer: Self-pay | Admitting: *Deleted

## 2011-10-21 NOTE — Telephone Encounter (Signed)
Order faxed to [place], pt aware

## 2011-10-21 NOTE — Telephone Encounter (Signed)
Pt has an appt for cpx on 11/09/11 with Dr. Artist Pais.  She requests to have lab work done at [facility] outpatient lab.  Please send orders to the lab.

## 2011-10-29 ENCOUNTER — Other Ambulatory Visit: Payer: Self-pay | Admitting: Internal Medicine

## 2011-10-30 LAB — CBC WITH DIFFERENTIAL/PLATELET
Basophils Absolute: 0 10*3/uL (ref 0.0–0.1)
Basophils Relative: 0 % (ref 0–1)
Eosinophils Absolute: 0.1 10*3/uL (ref 0.0–0.7)
Eosinophils Relative: 2 % (ref 0–5)
HCT: 44.7 % (ref 36.0–46.0)
Hemoglobin: 14.5 g/dL (ref 12.0–15.0)
Lymphocytes Relative: 28 % (ref 12–46)
Lymphs Abs: 1.6 10*3/uL (ref 0.7–4.0)
MCH: 29.8 pg (ref 26.0–34.0)
MCHC: 32.4 g/dL (ref 30.0–36.0)
MCV: 91.8 fL (ref 78.0–100.0)
Monocytes Absolute: 0.3 10*3/uL (ref 0.1–1.0)
Monocytes Relative: 5 % (ref 3–12)
Neutro Abs: 3.6 10*3/uL (ref 1.7–7.7)
Neutrophils Relative %: 65 % (ref 43–77)
Platelets: 207 10*3/uL (ref 150–400)
RBC: 4.87 MIL/uL (ref 3.87–5.11)
RDW: 13.1 % (ref 11.5–15.5)
WBC: 5.6 10*3/uL (ref 4.0–10.5)

## 2011-10-30 LAB — BASIC METABOLIC PANEL
BUN: 13 mg/dL (ref 6–23)
CO2: 29 mEq/L (ref 19–32)
Calcium: 9.9 mg/dL (ref 8.4–10.5)
Chloride: 105 mEq/L (ref 96–112)
Creat: 0.79 mg/dL (ref 0.50–1.10)
Glucose, Bld: 97 mg/dL (ref 70–99)
Potassium: 4.2 mEq/L (ref 3.5–5.3)
Sodium: 142 mEq/L (ref 135–145)

## 2011-10-30 LAB — HEPATIC FUNCTION PANEL
ALT: 12 U/L (ref 0–35)
AST: 20 U/L (ref 0–37)
Albumin: 4.4 g/dL (ref 3.5–5.2)
Alkaline Phosphatase: 61 U/L (ref 39–117)
Bilirubin, Direct: 0.1 mg/dL (ref 0.0–0.3)
Indirect Bilirubin: 0.5 mg/dL (ref 0.0–0.9)
Total Bilirubin: 0.6 mg/dL (ref 0.3–1.2)
Total Protein: 7.1 g/dL (ref 6.0–8.3)

## 2011-10-30 LAB — LIPID PANEL
Cholesterol: 206 mg/dL — ABNORMAL HIGH (ref 0–200)
HDL: 61 mg/dL (ref >39)
LDL Cholesterol: 124 mg/dL — ABNORMAL HIGH (ref 0–99)
Total CHOL/HDL Ratio: 3.4 Ratio
Triglycerides: 105 mg/dL (ref <150)
VLDL: 21 mg/dL (ref 0–40)

## 2011-10-30 LAB — TSH: TSH: 0.211 u[IU]/mL — ABNORMAL LOW (ref 0.350–4.500)

## 2011-11-09 ENCOUNTER — Encounter: Payer: Self-pay | Admitting: Internal Medicine

## 2011-11-09 ENCOUNTER — Ambulatory Visit (INDEPENDENT_AMBULATORY_CARE_PROVIDER_SITE_OTHER): Payer: BC Managed Care – PPO | Admitting: Internal Medicine

## 2011-11-09 VITALS — BP 104/76 | HR 64 | Temp 98.1°F | Ht 66.0 in | Wt 134.0 lb

## 2011-11-09 DIAGNOSIS — M25551 Pain in right hip: Secondary | ICD-10-CM | POA: Insufficient documentation

## 2011-11-09 DIAGNOSIS — Z85850 Personal history of malignant neoplasm of thyroid: Secondary | ICD-10-CM

## 2011-11-09 DIAGNOSIS — M25561 Pain in right knee: Secondary | ICD-10-CM

## 2011-11-09 DIAGNOSIS — G8929 Other chronic pain: Secondary | ICD-10-CM

## 2011-11-09 DIAGNOSIS — Z0000 Encounter for general adult medical examination without abnormal findings: Secondary | ICD-10-CM

## 2011-11-09 LAB — SEDIMENTATION RATE: Sed Rate: 18 mm/hr (ref 0–22)

## 2011-11-09 MED ORDER — LEVOTHYROXINE SODIUM 75 MCG PO TABS: 75.0000 ug | ORAL_TABLET | Freq: Every day | ORAL | Status: DC

## 2011-11-09 NOTE — Patient Instructions (Signed)
Please return in 2 months for repeat thyroid blood tests - 244.9

## 2011-11-09 NOTE — Assessment & Plan Note (Signed)
Reviewed adult health maintenance protocols. Patient is up to date with her Pap and pelvic. Her last mammogram was 7-8 months ago. She is up-to-date with her colonoscopy. Continue regular exercise and low saturated fat diet.

## 2011-11-09 NOTE — Progress Notes (Signed)
Subjective:    Patient ID: Amanda Pena, female    DOB: 01/11/1956, 56 y.o.   MRN: [id number]  HPI  56 year old white female with history of thyroid cancer status post partial thyroidectomy and iatrogenic hypothyroidism for routine physical. Overall patient is doing very well. She continues to exercise on regular basis. Her lipid panel and improved with the use of over-the-counter red yeast rice supplement. She complains that she is having difficulty maintaining her weight.  Over the last several months patient has noticed ongoing stiffness in her knees and hips. She has AM stiffness and symptoms after prolonged sitting. She has not noticed any redness or swelling of her joints. She denies wrist pain, hand pain or shoulder pain. She does not think her symptoms are related to the start of her over-the-counter supplement for elevated cholesterol.  Review of Systems     Constitutional: Negative for activity change, appetite change and unexpected weight change.  Eyes: Negative for visual disturbance.  Respiratory: Negative for cough, chest tightness and shortness of breath.   Cardiovascular: Negative for chest pain.  Genitourinary: Negative for difficulty urinating.  Neurological: Negative for headaches.  Gastrointestinal: Negative for abdominal pain, heartburn melena or hematochezia Psych: Negative for depression or anxiety Musculoskeletal:  See HPI   Past Medical History  Diagnosis Date  . HYPERLIPIDEMIA 04/09/2008    Qualifier: Diagnosis of  By: Nena Jordan   . HYPOTHYROIDISM 04/09/2008    Qualifier: Diagnosis of  By: Nena Jordan   . INSOMNIA, CHRONIC 08/21/2010    Qualifier: Diagnosis of  By: Nena Jordan   . PERSONAL HISTORY MALIGNANT NEOPLASM THYROID 08/21/2010    Qualifier: Diagnosis of  By: Nena Jordan     History   Social History  . Marital Status: Married    Spouse Name: N/A    Number of Children: N/A  . Years of Education: N/A   Occupational  History  . Not on file.   Social History Main Topics  . Smoking status: Never Smoker   . Smokeless tobacco: Not on file  . Alcohol Use: Yes  . Drug Use: No  . Sexually Active: Not on file   Other Topics Concern  . Not on file   Social History Narrative  . No narrative on file    Past Surgical History  Procedure Date  . Thyroidectomy, partial     Family History  Problem Relation Age of Onset  . Arthritis Mother   . Hyperlipidemia Mother   . Hyperlipidemia Father   . Diabetes Father   . Hypertension Father     Allergies not on file  Current Outpatient Prescriptions on File Prior to Visit  Medication Sig Dispense Refill  . levothyroxine (SYNTHROID, LEVOTHROID) 88 MCG tablet Take 88 mcg by mouth daily.        BP 104/76  Pulse 64  Temp(Src) 98.1 F (36.7 C) (Oral)  Ht 5\' 6"  (1.676 m)  Wt 134 lb (60.782 kg)  BMI 21.63 kg/m2    Objective:   Physical Exam  Constitutional: She is oriented to person, place, and time. She appears well-developed and well-nourished. No distress.  HENT:  Head: Normocephalic and atraumatic.  Right Ear: External ear normal.  Left Ear: External ear normal.  Mouth/Throat: Oropharynx is clear and moist.  Eyes: Conjunctivae and EOM are normal. Pupils are equal, round, and reactive to light.  Neck: Normal range of motion. Neck supple. No thyromegaly present.  Cardiovascular: Normal rate, regular rhythm  and normal heart sounds.   Pulmonary/Chest: Effort normal and breath sounds normal. No respiratory distress. She has no wheezes.  Abdominal: Soft. Bowel sounds are normal. She exhibits no mass. There is no tenderness.  Musculoskeletal: Normal range of motion. She exhibits no edema.       No swelling or edema of her knees,  normal range of motion of hips, no pain with internal or external rotation. No tenderness near trochanteric bursa.  Lymphadenopathy:    She has no cervical adenopathy.  Neurological: She is alert and oriented to person,  place, and time.  Skin: Skin is warm and dry. No rash noted.  Psychiatric: She has a normal mood and affect. Her behavior is normal.        Assessment & Plan:

## 2011-11-09 NOTE — Assessment & Plan Note (Signed)
56 year old female complains of morning stiffness in bilateral thighs and knees. Check ANA, rheumatoid factor and sedimentation rate. Check CPK considering recent start of red yeast rice supplement.

## 2011-11-10 LAB — ANA: Anti Nuclear Antibody(ANA): NEGATIVE

## 2011-11-10 LAB — CK: Total CK: 59 U/L (ref 7–177)

## 2011-11-10 LAB — RHEUMATOID FACTOR: Rhuematoid fact SerPl-aCnc: <10 IU/mL (ref <=14)

## 2011-11-10 LAB — THYROGLOBULIN LEVEL: Thyroglobulin: 5.8 ng/mL (ref 0.0–55.0)

## 2012-02-15 ENCOUNTER — Telehealth: Payer: Self-pay | Admitting: Internal Medicine

## 2012-02-15 NOTE — Telephone Encounter (Signed)
Lab order faxed to [facility], pt was also suppose to get a call back regarding bill.  Her last OV was not coded right.  She does not know who she had talked to but she states she has not heard anything.

## 2012-02-15 NOTE — Telephone Encounter (Signed)
Pt called and said that Dr Artist Pais has changed pt thyroid dose. Pt would like to get lab order to get tsh lvl rechecked tomorrow at the [facility] in [place], like she did before. Pls order lab. Pt wants this lab done tomorrow because pt is getting ready to go out of town for 1 wk.

## 2012-02-15 NOTE — Telephone Encounter (Signed)
Please arrange TSH and Free T4 at [place] location.  244.9 thx

## 2012-02-16 ENCOUNTER — Other Ambulatory Visit: Payer: Self-pay | Admitting: Internal Medicine

## 2012-02-17 LAB — TSH: TSH: 1.083 u[IU]/mL (ref 0.350–4.500)

## 2012-02-17 LAB — T4, FREE: Free T4: 1.38 ng/dL (ref 0.80–1.80)

## 2012-02-17 NOTE — Telephone Encounter (Signed)
Please call pt re: coding issue

## 2012-02-17 NOTE — Telephone Encounter (Signed)
Taken care of - correction submitted. Patient aware. Thank you.

## 2012-02-18 ENCOUNTER — Other Ambulatory Visit: Payer: Self-pay | Admitting: *Deleted

## 2012-02-18 MED ORDER — LEVOTHYROXINE SODIUM 75 MCG PO TABS: 75.0000 ug | ORAL_TABLET | Freq: Every day | ORAL | Status: DC

## 2013-01-03 ENCOUNTER — Telehealth: Payer: Self-pay | Admitting: Internal Medicine

## 2013-01-03 NOTE — Telephone Encounter (Signed)
Ok with me 

## 2013-01-03 NOTE — Telephone Encounter (Signed)
Pt req to transfer from Dr. Artist Pais to Dr. Macario Golds. Pt stated ok per Plot. Please advise.

## 2013-01-04 NOTE — Telephone Encounter (Signed)
Called pt to schedule an appt with Plot, pt stated no need an appt right now, will call back to schedule an appt.

## 2013-01-04 NOTE — Telephone Encounter (Signed)
Ok Thx 

## 2013-01-31 ENCOUNTER — Telehealth: Payer: Self-pay | Admitting: Internal Medicine

## 2013-01-31 DIAGNOSIS — Z0000 Encounter for general adult medical examination without abnormal findings: Secondary | ICD-10-CM

## 2013-01-31 NOTE — Telephone Encounter (Signed)
Message left for patient that lab orders have been placed for her to go to our [place] office to have them drawn. Contact number let if she has questions. Requisitions faxed to [place].

## 2013-02-07 LAB — CBC WITH DIFFERENTIAL/PLATELET
Basophils Absolute: 0 10*3/uL (ref 0.0–0.1)
Basophils Relative: 0 % (ref 0–1)
Eosinophils Absolute: 0.2 10*3/uL (ref 0.0–0.7)
Eosinophils Relative: 3 % (ref 0–5)
HCT: 42.4 % (ref 36.0–46.0)
Hemoglobin: 14.6 g/dL (ref 12.0–15.0)
Lymphocytes Relative: 29 % (ref 12–46)
Lymphs Abs: 1.4 10*3/uL (ref 0.7–4.0)
MCH: 30.3 pg (ref 26.0–34.0)
MCHC: 34.4 g/dL (ref 30.0–36.0)
MCV: 88 fL (ref 78.0–100.0)
Monocytes Absolute: 0.3 10*3/uL (ref 0.1–1.0)
Monocytes Relative: 6 % (ref 3–12)
Neutro Abs: 2.9 10*3/uL (ref 1.7–7.7)
Neutrophils Relative %: 62 % (ref 43–77)
Platelets: 202 10*3/uL (ref 150–400)
RBC: 4.82 MIL/uL (ref 3.87–5.11)
RDW: 13.6 % (ref 11.5–15.5)
WBC: 4.8 10*3/uL (ref 4.0–10.5)

## 2013-02-07 LAB — BASIC METABOLIC PANEL
BUN: 11 mg/dL (ref 6–23)
CO2: 29 mEq/L (ref 19–32)
Calcium: 10 mg/dL (ref 8.4–10.5)
Chloride: 105 mEq/L (ref 96–112)
Creat: 0.78 mg/dL (ref 0.50–1.10)
Glucose, Bld: 102 mg/dL — ABNORMAL HIGH (ref 70–99)
Potassium: 4.4 mEq/L (ref 3.5–5.3)
Sodium: 140 mEq/L (ref 135–145)

## 2013-02-07 LAB — HEPATIC FUNCTION PANEL
ALT: 13 U/L (ref 0–35)
AST: 18 U/L (ref 0–37)
Albumin: 4.5 g/dL (ref 3.5–5.2)
Alkaline Phosphatase: 61 U/L (ref 39–117)
Bilirubin, Direct: 0.1 mg/dL (ref 0.0–0.3)
Indirect Bilirubin: 0.6 mg/dL (ref 0.0–0.9)
Total Bilirubin: 0.7 mg/dL (ref 0.3–1.2)
Total Protein: 7.2 g/dL (ref 6.0–8.3)

## 2013-02-07 LAB — LIPID PANEL
Cholesterol: 232 mg/dL — ABNORMAL HIGH (ref 0–200)
HDL: 61 mg/dL (ref >39)
LDL Cholesterol: 148 mg/dL — ABNORMAL HIGH (ref 0–99)
Total CHOL/HDL Ratio: 3.8 Ratio
Triglycerides: 114 mg/dL (ref <150)
VLDL: 23 mg/dL (ref 0–40)

## 2013-02-07 LAB — TSH: TSH: 1.034 u[IU]/mL (ref 0.350–4.500)

## 2013-02-08 LAB — URINALYSIS
Bilirubin Urine: NEGATIVE
Glucose, UA: NEGATIVE mg/dL
Hgb urine dipstick: NEGATIVE
Ketones, ur: NEGATIVE mg/dL
Leukocytes, UA: NEGATIVE
Nitrite: NEGATIVE
Protein, ur: NEGATIVE mg/dL
Specific Gravity, Urine: 1.018 (ref 1.005–1.030)
Urobilinogen, UA: 0.2 mg/dL (ref 0.0–1.0)
pH: 7 (ref 5.0–8.0)

## 2013-02-10 ENCOUNTER — Ambulatory Visit (INDEPENDENT_AMBULATORY_CARE_PROVIDER_SITE_OTHER): Payer: BC Managed Care – PPO | Admitting: Internal Medicine

## 2013-02-10 ENCOUNTER — Encounter: Payer: Self-pay | Admitting: Internal Medicine

## 2013-02-10 VITALS — BP 112/70 | HR 76 | Temp 98.4°F | Resp 16 | Ht 66.0 in | Wt 130.0 lb

## 2013-02-10 DIAGNOSIS — Z0000 Encounter for general adult medical examination without abnormal findings: Secondary | ICD-10-CM

## 2013-02-10 DIAGNOSIS — E039 Hypothyroidism, unspecified: Secondary | ICD-10-CM

## 2013-02-10 DIAGNOSIS — Z7189 Other specified counseling: Secondary | ICD-10-CM | POA: Insufficient documentation

## 2013-02-10 DIAGNOSIS — E785 Hyperlipidemia, unspecified: Secondary | ICD-10-CM

## 2013-02-10 MED ORDER — LEVOTHYROXINE SODIUM 75 MCG PO TABS: 75.0000 ug | ORAL_TABLET | Freq: Every day | ORAL | Status: DC

## 2013-02-10 NOTE — Progress Notes (Signed)
  Subjective:    HPI  The patient is here for a wellness exam. The patient has been doing well overall without major physical or psychological issues going on lately.  Wt Readings from Last 3 Encounters:  02/10/13 130 lb (58.968 kg)  11/09/11 134 lb (60.782 kg)  08/21/10 128 lb 4 oz (58.174 kg)   BP Readings from Last 3 Encounters:  02/10/13 112/70  11/09/11 104/76  08/21/10 90/60      Review of Systems  Constitutional: Negative for chills, activity change, appetite change, fatigue and unexpected weight change.  HENT: Negative for ear pain, nosebleeds, congestion, facial swelling, mouth sores, trouble swallowing, voice change and sinus pressure.   Eyes: Negative for visual disturbance.  Respiratory: Negative for cough and chest tightness.   Gastrointestinal: Negative for nausea and abdominal pain.  Genitourinary: Negative for dysuria, frequency, difficulty urinating and vaginal pain.  Musculoskeletal: Positive for arthralgias (knee pain). Negative for myalgias, back pain, joint swelling and gait problem.  Skin: Negative for pallor and rash.  Allergic/Immunologic: Negative for environmental allergies.  Neurological: Negative for dizziness, tremors, weakness, numbness and headaches.  Psychiatric/Behavioral: Negative for suicidal ideas, confusion, sleep disturbance, dysphoric mood and decreased concentration. The patient is not nervous/anxious.        Objective:   Physical Exam  Constitutional: She appears well-developed. No distress.  HENT:  Head: Normocephalic.  Right Ear: External ear normal.  Left Ear: External ear normal.  Nose: Nose normal.  Mouth/Throat: Oropharynx is clear and moist.  Eyes: Conjunctivae are normal. Pupils are equal, round, and reactive to light. Right eye exhibits no discharge. Left eye exhibits no discharge.  Neck: Normal range of motion. Neck supple. No JVD present. No tracheal deviation present. No thyromegaly present.  Cardiovascular: Normal  rate, regular rhythm and normal heart sounds.   Pulmonary/Chest: No stridor. No respiratory distress. She has no wheezes.  Abdominal: Soft. Bowel sounds are normal. She exhibits no distension and no mass. There is no tenderness. There is no rebound and no guarding.  Musculoskeletal: She exhibits no edema and no tenderness.  Lymphadenopathy:    She has no cervical adenopathy.  Neurological: She displays normal reflexes. No cranial nerve deficit. She exhibits normal muscle tone. Coordination normal.  Skin: No rash noted. No erythema.  Psychiatric: She has a normal mood and affect. Her behavior is normal. Judgment and thought content normal.    Lab Results  Component Value Date   WBC 4.8 02/07/2013   HGB 14.6 02/07/2013   HCT 42.4 02/07/2013   PLT 202 02/07/2013   GLUCOSE 102* 02/07/2013   CHOL 232* 02/07/2013   TRIG 114 02/07/2013   HDL 61 02/07/2013   LDLDIRECT 186.2 04/16/2008   LDLCALC 148* 02/07/2013   ALT 13 02/07/2013   AST 18 02/07/2013   NA 140 02/07/2013   K 4.4 02/07/2013   CL 105 02/07/2013   CREATININE 0.78 02/07/2013   BUN 11 02/07/2013   CO2 29 02/07/2013   TSH 1.034 02/07/2013         Assessment & Plan:

## 2013-02-10 NOTE — Assessment & Plan Note (Addendum)
We discussed age appropriate health related issues, including available/recomended screening tests and vaccinations. We discussed a need for adhering to healthy diet and exercise. Labs/EKG were reviewed/ordered. All questions were answered. GYN q 12 mo Declined shots  Colon up to date

## 2013-02-10 NOTE — Assessment & Plan Note (Signed)
Continue with current prescription therapy as reflected on the Med list.  

## 2013-05-25 ENCOUNTER — Other Ambulatory Visit: Payer: Self-pay

## 2014-01-17 LAB — HM PAP SMEAR

## 2014-02-14 ENCOUNTER — Other Ambulatory Visit: Payer: Self-pay | Admitting: Internal Medicine

## 2014-03-07 ENCOUNTER — Encounter: Payer: Self-pay | Admitting: Internal Medicine

## 2014-03-07 ENCOUNTER — Ambulatory Visit (INDEPENDENT_AMBULATORY_CARE_PROVIDER_SITE_OTHER): Payer: BC Managed Care – PPO | Admitting: Internal Medicine

## 2014-03-07 VITALS — BP 100/76 | HR 88 | Temp 98.8°F | Resp 16 | Ht 66.0 in | Wt 131.0 lb

## 2014-03-07 DIAGNOSIS — F418 Other specified anxiety disorders: Secondary | ICD-10-CM

## 2014-03-07 DIAGNOSIS — E785 Hyperlipidemia, unspecified: Secondary | ICD-10-CM

## 2014-03-07 DIAGNOSIS — E039 Hypothyroidism, unspecified: Secondary | ICD-10-CM

## 2014-03-07 DIAGNOSIS — F411 Generalized anxiety disorder: Secondary | ICD-10-CM

## 2014-03-07 DIAGNOSIS — G4700 Insomnia, unspecified: Secondary | ICD-10-CM

## 2014-03-07 DIAGNOSIS — F430 Acute stress reaction: Secondary | ICD-10-CM

## 2014-03-07 DIAGNOSIS — Z0000 Encounter for general adult medical examination without abnormal findings: Secondary | ICD-10-CM

## 2014-03-07 MED ORDER — ALPRAZOLAM 0.25 MG PO TABS: 0.2500 mg | ORAL_TABLET | Freq: Two times a day (BID) | ORAL | Status: DC | PRN

## 2014-03-07 MED ORDER — SYNTHROID 75 MCG PO TABS: ORAL_TABLET | ORAL | Status: DC

## 2014-03-07 NOTE — Progress Notes (Signed)
Pre visit review using our clinic review tool, if applicable. No additional management support is needed unless otherwise documented below in the visit note. 

## 2014-03-07 NOTE — Assessment & Plan Note (Signed)
8/15 father was diagnosed w/lung ca Xanax prn

## 2014-03-07 NOTE — Assessment & Plan Note (Signed)
Continue with current prescription therapy as reflected on the Med list.  

## 2014-03-07 NOTE — Assessment & Plan Note (Signed)
We discussed age appropriate health related issues, including available/recomended screening tests and vaccinations. We discussed a need for adhering to healthy diet and exercise. Labs were reviewed/ordered. All questions were answered. Pt declined shots

## 2014-03-07 NOTE — Progress Notes (Signed)
   Subjective:    HPI  The patient is here for a wellness exam.  C/o stress and anxiety - her father was just diagnosed with lung cancer F/u insomnia  Wt Readings from Last 3 Encounters:  03/07/14 131 lb (59.421 kg)  02/10/13 130 lb (58.968 kg)  11/09/11 134 lb (60.782 kg)   BP Readings from Last 3 Encounters:  03/07/14 100/76  02/10/13 112/70  11/09/11 104/76      Review of Systems  Constitutional: Negative for chills, activity change, appetite change, fatigue and unexpected weight change.  HENT: Negative for congestion, ear pain, facial swelling, mouth sores, nosebleeds, sinus pressure, trouble swallowing and voice change.   Eyes: Negative for visual disturbance.  Respiratory: Negative for cough and chest tightness.   Gastrointestinal: Negative for nausea and abdominal pain.  Genitourinary: Negative for dysuria, frequency, difficulty urinating and vaginal pain.  Musculoskeletal: Positive for arthralgias (knee pain). Negative for back pain, gait problem, joint swelling and myalgias.  Skin: Negative for pallor and rash.  Allergic/Immunologic: Negative for environmental allergies.  Neurological: Negative for dizziness, tremors, weakness, numbness and headaches.  Psychiatric/Behavioral: Negative for suicidal ideas, confusion, sleep disturbance, dysphoric mood and decreased concentration. The patient is not nervous/anxious.        Objective:   Physical Exam  Constitutional: She appears well-developed. No distress.  HENT:  Head: Normocephalic.  Right Ear: External ear normal.  Left Ear: External ear normal.  Nose: Nose normal.  Mouth/Throat: Oropharynx is clear and moist.  Eyes: Conjunctivae are normal. Pupils are equal, round, and reactive to light. Right eye exhibits no discharge. Left eye exhibits no discharge.  Neck: Normal range of motion. Neck supple. No JVD present. No tracheal deviation present. No thyromegaly present.  Cardiovascular: Normal rate, regular rhythm  and normal heart sounds.   Pulmonary/Chest: No stridor. No respiratory distress. She has no wheezes.  Abdominal: Soft. Bowel sounds are normal. She exhibits no distension and no mass. There is no tenderness. There is no rebound and no guarding.  Musculoskeletal: She exhibits no edema and no tenderness.  Lymphadenopathy:    She has no cervical adenopathy.  Neurological: She displays normal reflexes. No cranial nerve deficit. She exhibits normal muscle tone. Coordination normal.  Skin: No rash noted. No erythema.  Psychiatric: She has a normal mood and affect. Her behavior is normal. Judgment and thought content normal.    Lab Results  Component Value Date   WBC 4.8 02/07/2013   HGB 14.6 02/07/2013   HCT 42.4 02/07/2013   PLT 202 02/07/2013   GLUCOSE 102* 02/07/2013   CHOL 232* 02/07/2013   TRIG 114 02/07/2013   HDL 61 02/07/2013   LDLDIRECT 186.2 04/16/2008   LDLCALC 148* 02/07/2013   ALT 13 02/07/2013   AST 18 02/07/2013   NA 140 02/07/2013   K 4.4 02/07/2013   CL 105 02/07/2013   CREATININE 0.78 02/07/2013   BUN 11 02/07/2013   CO2 29 02/07/2013   TSH 1.034 02/07/2013         Assessment & Plan:

## 2014-03-07 NOTE — Assessment & Plan Note (Signed)
Not using meds

## 2014-03-08 ENCOUNTER — Other Ambulatory Visit: Payer: Self-pay | Admitting: Internal Medicine

## 2014-03-08 LAB — URINALYSIS, ROUTINE W REFLEX MICROSCOPIC
Bilirubin Urine: NEGATIVE
Glucose, UA: NEGATIVE mg/dL
Hgb urine dipstick: NEGATIVE
Ketones, ur: NEGATIVE mg/dL
Nitrite: NEGATIVE
Protein, ur: NEGATIVE mg/dL
Specific Gravity, Urine: 1.02 (ref 1.005–1.030)
Urobilinogen, UA: 0.2 mg/dL (ref 0.0–1.0)
pH: 6.5 (ref 5.0–8.0)

## 2014-03-08 LAB — CBC
HCT: 41.4 % (ref 36.0–46.0)
Hemoglobin: 14.2 g/dL (ref 12.0–15.0)
MCH: 30.4 pg (ref 26.0–34.0)
MCHC: 34.3 g/dL (ref 30.0–36.0)
MCV: 88.7 fL (ref 78.0–100.0)
Platelets: 181 10*3/uL (ref 150–400)
RBC: 4.67 MIL/uL (ref 3.87–5.11)
RDW: 13.3 % (ref 11.5–15.5)
WBC: 4.5 10*3/uL (ref 4.0–10.5)

## 2014-03-08 LAB — HEPATIC FUNCTION PANEL
ALT: 12 U/L (ref 0–35)
AST: 18 U/L (ref 0–37)
Albumin: 4.3 g/dL (ref 3.5–5.2)
Alkaline Phosphatase: 55 U/L (ref 39–117)
Bilirubin, Direct: 0.1 mg/dL (ref 0.0–0.3)
Indirect Bilirubin: 0.8 mg/dL (ref 0.2–1.2)
Total Bilirubin: 0.9 mg/dL (ref 0.2–1.2)
Total Protein: 7.3 g/dL (ref 6.0–8.3)

## 2014-03-08 LAB — BASIC METABOLIC PANEL
BUN: 16 mg/dL (ref 6–23)
CO2: 29 mEq/L (ref 19–32)
Calcium: 9.3 mg/dL (ref 8.4–10.5)
Chloride: 102 mEq/L (ref 96–112)
Creat: 0.78 mg/dL (ref 0.50–1.10)
Glucose, Bld: 92 mg/dL (ref 70–99)
Potassium: 4.4 mEq/L (ref 3.5–5.3)
Sodium: 139 mEq/L (ref 135–145)

## 2014-03-08 LAB — LIPID PANEL
Cholesterol: 274 mg/dL — ABNORMAL HIGH (ref 0–200)
HDL: 59 mg/dL (ref >39)
LDL Cholesterol: 195 mg/dL — ABNORMAL HIGH (ref 0–99)
Total CHOL/HDL Ratio: 4.6 Ratio
Triglycerides: 100 mg/dL (ref <150)
VLDL: 20 mg/dL (ref 0–40)

## 2014-03-08 LAB — URINALYSIS, MICROSCOPIC ONLY
Bacteria, UA: NONE SEEN
Casts: NONE SEEN
Squamous Epithelial / LPF: NONE SEEN

## 2014-03-08 LAB — TSH: TSH: 1.153 u[IU]/mL (ref 0.350–4.500)

## 2014-03-20 ENCOUNTER — Telehealth: Payer: Self-pay | Admitting: *Deleted

## 2014-03-20 NOTE — Telephone Encounter (Signed)
Left msg on triage stating never heard back from labs done 03/08/14. Called pt back inform her md release results to mychart. Pt stated she doesn't want to use the mychart system wanting acct deactivated. Gave md response concerning labs also deactivated acct...Amanda Pena

## 2014-04-19 ENCOUNTER — Encounter: Payer: Self-pay | Admitting: Gastroenterology

## 2014-06-06 ENCOUNTER — Telehealth: Payer: Self-pay | Admitting: Internal Medicine

## 2014-06-06 NOTE — Telephone Encounter (Signed)
Pls forward to billing and ask to correct Thx

## 2014-06-06 NOTE — Telephone Encounter (Signed)
Pt called stating that she has been having billing issues with billing codes that were submitted during last CPE appt on 03/07/2014. Pt states [employer] states that it was not processed as a wellness visit and her insurance is not trying to cover this appt.  Insurance is stating [place] code was processed incorrectly. I have provided the pt with billing phone number to discuss the issues with them but pt wanted you to be informed of this matter.

## 2014-10-15 ENCOUNTER — Ambulatory Visit (INDEPENDENT_AMBULATORY_CARE_PROVIDER_SITE_OTHER)
Admission: RE | Admit: 2014-10-15 | Discharge: 2014-10-15 | Disposition: A | Discharge status: Home / Self Care | Payer: BLUE CROSS/BLUE SHIELD | Source: Ambulatory Visit | Attending: Internal Medicine | Admitting: Internal Medicine

## 2014-10-15 ENCOUNTER — Encounter: Payer: Self-pay | Admitting: Internal Medicine

## 2014-10-15 ENCOUNTER — Ambulatory Visit (INDEPENDENT_AMBULATORY_CARE_PROVIDER_SITE_OTHER): Payer: BLUE CROSS/BLUE SHIELD | Admitting: Internal Medicine

## 2014-10-15 VITALS — BP 124/82 | HR 73 | Temp 98.6°F | Resp 16 | Wt 130.0 lb

## 2014-10-15 DIAGNOSIS — M542 Cervicalgia: Secondary | ICD-10-CM

## 2014-10-15 DIAGNOSIS — R05 Cough: Secondary | ICD-10-CM | POA: Diagnosis not present

## 2014-10-15 DIAGNOSIS — R059 Cough, unspecified: Secondary | ICD-10-CM | POA: Insufficient documentation

## 2014-10-15 MED ORDER — MELOXICAM 15 MG PO TABS: 15.0000 mg | ORAL_TABLET | Freq: Every day | ORAL | Status: DC | PRN

## 2014-10-15 NOTE — Assessment & Plan Note (Signed)
3/16 Meloxicam qd Pt declined pain meds, gabapentin X ray

## 2014-10-15 NOTE — Patient Instructions (Signed)
Contour pillow  

## 2014-10-15 NOTE — Assessment & Plan Note (Signed)
CXR

## 2014-10-15 NOTE — Progress Notes (Signed)
Pre visit review using our clinic review tool, if applicable. No additional management support is needed unless otherwise documented below in the visit note. 

## 2014-10-15 NOTE — Progress Notes (Signed)
Subjective:    Neck Pain  This is a recurrent (years off and on x3 wks at the time) problem. The current episode started 1 to 4 weeks ago. The problem has been unchanged. The pain is present in the midline. The quality of the pain is described as stabbing. The pain is at a severity of 7/10. Nothing aggravates the symptoms. The pain is same all the time. Stiffness is present in the morning. Pertinent negatives include no headaches, numbness, trouble swallowing or weakness.  Cough x 2 weeks   F/u stress and anxiety - her father was diagnosed with lung cancer F/u insomnia  Wt Readings from Last 3 Encounters:  10/15/14 130 lb (58.968 kg)  03/07/14 131 lb (59.421 kg)  02/10/13 130 lb (58.968 kg)   BP Readings from Last 3 Encounters:  10/15/14 124/82  03/07/14 100/76  02/10/13 112/70      Review of Systems  Constitutional: Negative for chills, activity change, appetite change, fatigue and unexpected weight change.  HENT: Negative for congestion, ear pain, facial swelling, mouth sores, nosebleeds, sinus pressure, trouble swallowing and voice change.   Eyes: Negative for visual disturbance.  Respiratory: Negative for cough and chest tightness.   Gastrointestinal: Negative for nausea and abdominal pain.  Genitourinary: Negative for dysuria, frequency, difficulty urinating and vaginal pain.  Musculoskeletal: Positive for arthralgias (knee pain) and neck pain. Negative for myalgias, back pain, joint swelling and gait problem.  Skin: Negative for pallor and rash.  Allergic/Immunologic: Negative for environmental allergies.  Neurological: Negative for dizziness, tremors, weakness, numbness and headaches.  Psychiatric/Behavioral: Negative for suicidal ideas, confusion, sleep disturbance, dysphoric mood and decreased concentration. The patient is not nervous/anxious.        Objective:   Physical Exam  Constitutional: She appears well-developed. No distress.  HENT:  Head:  Normocephalic.  Right Ear: External ear normal.  Left Ear: External ear normal.  Nose: Nose normal.  Mouth/Throat: Oropharynx is clear and moist.  Eyes: Conjunctivae are normal. Pupils are equal, round, and reactive to light. Right eye exhibits no discharge. Left eye exhibits no discharge.  Neck: Normal range of motion. Neck supple. No JVD present. No tracheal deviation present. No thyromegaly present.  Cardiovascular: Normal rate, regular rhythm and normal heart sounds.   Pulmonary/Chest: No stridor. No respiratory distress. She has no wheezes.  Abdominal: Soft. Bowel sounds are normal. She exhibits no distension and no mass. There is no tenderness. There is no rebound and no guarding.  Musculoskeletal: She exhibits no edema or tenderness.  Lymphadenopathy:    She has no cervical adenopathy.  Neurological: She displays normal reflexes. No cranial nerve deficit. She exhibits normal muscle tone. Coordination normal.  Skin: No rash noted. No erythema.  Psychiatric: She has a normal mood and affect. Her behavior is normal. Judgment and thought content normal.  C spine is w/full ROM, tender MS WNL  Lab Results  Component Value Date   WBC 4.5 03/08/2014   HGB 14.2 03/08/2014   HCT 41.4 03/08/2014   PLT 181 03/08/2014   GLUCOSE 92 03/08/2014   CHOL 274* 03/08/2014   TRIG 100 03/08/2014   HDL 59 03/08/2014   LDLDIRECT 186.2 04/16/2008   LDLCALC 195* 03/08/2014   ALT 12 03/08/2014   AST 18 03/08/2014   NA 139 03/08/2014   K 4.4 03/08/2014   CL 102 03/08/2014   CREATININE 0.78 03/08/2014   BUN 16 03/08/2014   CO2 29 03/08/2014   TSH 1.153 03/08/2014  Assessment & Plan:

## 2014-12-24 ENCOUNTER — Telehealth: Payer: Self-pay | Admitting: *Deleted

## 2014-12-24 NOTE — Telephone Encounter (Signed)
lmovm to check to see if pt is updated on her mammogram.

## 2015-02-18 LAB — HM MAMMOGRAPHY

## 2015-05-21 ENCOUNTER — Other Ambulatory Visit: Payer: Self-pay | Admitting: Internal Medicine

## 2015-05-21 ENCOUNTER — Ambulatory Visit (INDEPENDENT_AMBULATORY_CARE_PROVIDER_SITE_OTHER): Payer: BLUE CROSS/BLUE SHIELD | Admitting: Internal Medicine

## 2015-05-21 ENCOUNTER — Encounter: Payer: Self-pay | Admitting: Internal Medicine

## 2015-05-21 VITALS — BP 110/72 | HR 80 | Ht 65.5 in | Wt 129.0 lb

## 2015-05-21 DIAGNOSIS — Z0000 Encounter for general adult medical examination without abnormal findings: Secondary | ICD-10-CM | POA: Diagnosis not present

## 2015-05-21 DIAGNOSIS — E034 Atrophy of thyroid (acquired): Secondary | ICD-10-CM

## 2015-05-21 DIAGNOSIS — E038 Other specified hypothyroidism: Secondary | ICD-10-CM

## 2015-05-21 MED ORDER — SYNTHROID 75 MCG PO TABS
ORAL_TABLET | ORAL | Status: DC
Start: 2015-05-21 — End: 2016-03-24

## 2015-05-21 NOTE — Progress Notes (Signed)
Subjective:  Patient ID: Amanda Pena, female    DOB: 06-06-1956  Age: 59 y.o. MRN: [id number]  CC: Annual Exam   HPI Amanda Pena presents for well exam.  Outpatient Prescriptions Prior to Visit  Medication Sig Dispense Refill  . Ascorbic Acid (VITAMIN C) 1000 MG tablet Take 1,000 mg by mouth daily.    . Cholecalciferol 2000 UNITS CAPS Take 1-2 each by mouth daily.     . Omega-3 Fatty Acids (FISH OIL) 1000 MG CAPS Take 2 capsules by mouth daily.    . Misc Natural Products (JOINT SUPPORT PO) Take 2 each by mouth daily.    Marland Kitchen SYNTHROID 75 MCG tablet TAKE ONE TABLET BY MOUTH ONCE DAILY 90 tablet 3  . ALPRAZolam (XANAX) 0.25 MG tablet Take 1 tablet (0.25 mg total) by mouth 2 (two) times daily as needed for anxiety. (Patient not taking: Reported on 05/21/2015) 60 tablet 1  . Garlic 5284 MG CAPS Take 1 each by mouth daily.    . meloxicam (MOBIC) 15 MG tablet Take 1 tablet (15 mg total) by mouth daily as needed for pain. (Patient not taking: Reported on 05/21/2015) 30 tablet 2   No facility-administered medications prior to visit.    ROS Review of Systems  Constitutional: Negative for chills, activity change, appetite change, fatigue and unexpected weight change.  HENT: Negative for congestion, mouth sores and sinus pressure.   Eyes: Negative for visual disturbance.  Respiratory: Negative for cough and chest tightness.   Gastrointestinal: Negative for nausea and abdominal pain.  Genitourinary: Negative for frequency, difficulty urinating and vaginal pain.  Musculoskeletal: Negative for back pain and gait problem.  Skin: Negative for pallor and rash.  Neurological: Negative for dizziness, tremors, weakness, numbness and headaches.  Psychiatric/Behavioral: Negative for suicidal ideas, confusion and sleep disturbance. The patient is not nervous/anxious.     Objective:  BP 110/72 mmHg  Pulse 80  Ht 5' 5.5" (1.664 m)  Wt 129 lb (58.514 kg)  BMI 21.13 kg/m2  SpO2 95%  BP  Readings from Last 3 Encounters:  05/21/15 110/72  10/15/14 124/82  03/07/14 100/76    Wt Readings from Last 3 Encounters:  05/21/15 129 lb (58.514 kg)  10/15/14 130 lb (58.968 kg)  03/07/14 131 lb (59.421 kg)    Physical Exam  Constitutional: She appears well-developed. No distress.  HENT:  Head: Normocephalic.  Right Ear: External ear normal.  Left Ear: External ear normal.  Nose: Nose normal.  Mouth/Throat: Oropharynx is clear and moist.  Eyes: Conjunctivae are normal. Pupils are equal, round, and reactive to light. Right eye exhibits no discharge. Left eye exhibits no discharge.  Neck: Normal range of motion. Neck supple. No JVD present. No tracheal deviation present. No thyromegaly present.  Cardiovascular: Normal rate, regular rhythm and normal heart sounds.   Pulmonary/Chest: No stridor. No respiratory distress. She has no wheezes.  Abdominal: Soft. Bowel sounds are normal. She exhibits no distension and no mass. There is no tenderness. There is no rebound and no guarding.  Musculoskeletal: She exhibits no edema or tenderness.  Lymphadenopathy:    She has no cervical adenopathy.  Neurological: She displays normal reflexes. No cranial nerve deficit. She exhibits normal muscle tone. Coordination normal.  Skin: No rash noted. No erythema.  Psychiatric: She has a normal mood and affect. Her behavior is normal. Judgment and thought content normal.    Lab Results  Component Value Date   WBC 4.5 03/08/2014   HGB 14.2 03/08/2014  HCT 41.4 03/08/2014   PLT 181 03/08/2014   GLUCOSE 92 03/08/2014   CHOL 274* 03/08/2014   TRIG 100 03/08/2014   HDL 59 03/08/2014   LDLDIRECT 186.2 04/16/2008   LDLCALC 195* 03/08/2014   ALT 12 03/08/2014   AST 18 03/08/2014   NA 139 03/08/2014   K 4.4 03/08/2014   CL 102 03/08/2014   CREATININE 0.78 03/08/2014   BUN 16 03/08/2014   CO2 29 03/08/2014   TSH 1.153 03/08/2014    Dg Chest 2 View  10/15/2014  CLINICAL DATA:  Cough for 2  weeks. EXAM: CHEST  2 VIEW COMPARISON:  04/16/2008 FINDINGS: The heart size and mediastinal contours are within normal limits. Both lungs are clear. The visualized skeletal structures are unremarkable. IMPRESSION: No active cardiopulmonary disease. Electronically Signed   By: Rolm Baptise M.D.   On: 10/15/2014 16:58   Dg Cervical Spine Complete  10/15/2014  CLINICAL DATA:  Patient with posterior cervical pain for 3 weeks. History of partial thyroidectomy. EXAM: CERVICAL SPINE  4+ VIEWS COMPARISON:  None. FINDINGS: Visualization through C7 vertebral body on lateral view. Multilevel degenerative disc disease most pronounced at C5-6 and C6-7. Normal anatomic alignment. No evidence for acute fracture. Craniocervical junction unremarkable. Prevertebral soft tissues unremarkable. Surgical clips within the left cervical soft tissues, compatible with history of partial thyroidectomy. Lung apices are unremarkable. Multilevel osseous neural foraminal narrowing. IMPRESSION: Multilevel degenerative disc disease.  No acute osseous abnormality. Electronically Signed   By: Lovey Newcomer M.D.   On: 10/15/2014 17:01    Assessment & Plan:   Caya was seen today for annual exam.  Diagnoses and all orders for this visit:  Well adult exam -     Hepatic function panel; Future -     CBC with Differential/Platelet; Future -     Basic metabolic panel; Future -     TSH; Future -     Urinalysis; Future -     Lipid panel; Future  Hypothyroidism due to acquired atrophy of thyroid  Other orders -     SYNTHROID 75 MCG tablet; TAKE ONE TABLET BY MOUTH ONCE DAILY  I have discontinued Ms. Steiner's Garlic, Misc Natural Products (JOINT SUPPORT PO), and meloxicam. I am also having her maintain her Fish Oil, Cholecalciferol, vitamin C, ALPRAZolam, and SYNTHROID.  Meds ordered this encounter  Medications  . SYNTHROID 75 MCG tablet    Sig: TAKE ONE TABLET BY MOUTH ONCE DAILY    Dispense:  30 tablet    Refill:  11      Follow-up: Return in about 1 year (around 05/20/2016) for Wellness Exam.  Walker Kehr, MD

## 2015-05-21 NOTE — Progress Notes (Signed)
Pre visit review using our clinic review tool, if applicable. No additional management support is needed unless otherwise documented below in the visit note. 

## 2015-05-21 NOTE — Assessment & Plan Note (Signed)
Labs

## 2015-05-21 NOTE — Assessment & Plan Note (Signed)
We discussed age appropriate health related issues, including available/recomended screening tests and vaccinations. We discussed a need for adhering to healthy diet and exercise. Labs/EKG were reviewed/ordered. All questions were answered.   

## 2015-05-24 ENCOUNTER — Other Ambulatory Visit (INDEPENDENT_AMBULATORY_CARE_PROVIDER_SITE_OTHER): Payer: BLUE CROSS/BLUE SHIELD

## 2015-05-24 DIAGNOSIS — Z0000 Encounter for general adult medical examination without abnormal findings: Secondary | ICD-10-CM | POA: Diagnosis not present

## 2015-05-24 LAB — URINALYSIS, ROUTINE W REFLEX MICROSCOPIC
Bilirubin Urine: NEGATIVE
Hgb urine dipstick: NEGATIVE
Ketones, ur: NEGATIVE
Nitrite: NEGATIVE
Specific Gravity, Urine: 1.025 (ref 1.000–1.030)
Total Protein, Urine: NEGATIVE
Urine Glucose: NEGATIVE
Urobilinogen, UA: 0.2 (ref 0.0–1.0)
pH: 6 (ref 5.0–8.0)

## 2015-05-24 LAB — CBC WITH DIFFERENTIAL/PLATELET
Basophils Absolute: 0 10*3/uL (ref 0.0–0.1)
Basophils Relative: 0.6 % (ref 0.0–3.0)
Eosinophils Absolute: 0.2 10*3/uL (ref 0.0–0.7)
Eosinophils Relative: 3.2 % (ref 0.0–5.0)
HCT: 42.9 % (ref 36.0–46.0)
Hemoglobin: 14.4 g/dL (ref 12.0–15.0)
Lymphocytes Relative: 31.6 % (ref 12.0–46.0)
Lymphs Abs: 1.8 10*3/uL (ref 0.7–4.0)
MCHC: 33.7 g/dL (ref 30.0–36.0)
MCV: 89.1 fl (ref 78.0–100.0)
Monocytes Absolute: 0.4 10*3/uL (ref 0.1–1.0)
Monocytes Relative: 6.6 % (ref 3.0–12.0)
Neutro Abs: 3.2 10*3/uL (ref 1.4–7.7)
Neutrophils Relative %: 58 % (ref 43.0–77.0)
Platelets: 191 10*3/uL (ref 150.0–400.0)
RBC: 4.81 Mil/uL (ref 3.87–5.11)
RDW: 13.2 % (ref 11.5–15.5)
WBC: 5.5 10*3/uL (ref 4.0–10.5)

## 2015-05-24 LAB — BASIC METABOLIC PANEL
BUN: 16 mg/dL (ref 6–23)
CO2: 30 mEq/L (ref 19–32)
Calcium: 9.5 mg/dL (ref 8.4–10.5)
Chloride: 104 mEq/L (ref 96–112)
Creatinine, Ser: 0.87 mg/dL (ref 0.40–1.20)
GFR: 71.27 mL/min (ref >60.00)
Glucose, Bld: 103 mg/dL — ABNORMAL HIGH (ref 70–99)
Potassium: 3.9 mEq/L (ref 3.5–5.1)
Sodium: 140 mEq/L (ref 135–145)

## 2015-05-24 LAB — LIPID PANEL
Cholesterol: 253 mg/dL — ABNORMAL HIGH (ref 0–200)
HDL: 53.3 mg/dL (ref >39.00)
LDL Cholesterol: 170 mg/dL — ABNORMAL HIGH (ref 0–99)
NonHDL: 199.97
Total CHOL/HDL Ratio: 5
Triglycerides: 148 mg/dL (ref 0.0–149.0)
VLDL: 29.6 mg/dL (ref 0.0–40.0)

## 2015-05-24 LAB — HEPATIC FUNCTION PANEL
ALT: 12 U/L (ref 0–35)
AST: 14 U/L (ref 0–37)
Albumin: 4.1 g/dL (ref 3.5–5.2)
Alkaline Phosphatase: 60 U/L (ref 39–117)
Bilirubin, Direct: 0.1 mg/dL (ref 0.0–0.3)
Total Bilirubin: 0.6 mg/dL (ref 0.2–1.2)
Total Protein: 7 g/dL (ref 6.0–8.3)

## 2015-05-24 LAB — TSH: TSH: 1.76 u[IU]/mL (ref 0.35–4.50)

## 2015-06-27 ENCOUNTER — Ambulatory Visit: Payer: BLUE CROSS/BLUE SHIELD | Admitting: Family Medicine

## 2016-01-29 ENCOUNTER — Other Ambulatory Visit: Payer: Self-pay

## 2016-01-29 ENCOUNTER — Ambulatory Visit (INDEPENDENT_AMBULATORY_CARE_PROVIDER_SITE_OTHER): Payer: BLUE CROSS/BLUE SHIELD | Admitting: Family Medicine

## 2016-01-29 ENCOUNTER — Encounter: Payer: Self-pay | Admitting: Family Medicine

## 2016-01-29 VITALS — BP 112/72 | HR 65 | Ht 66.0 in | Wt 133.0 lb

## 2016-01-29 DIAGNOSIS — M25511 Pain in right shoulder: Secondary | ICD-10-CM

## 2016-01-29 DIAGNOSIS — M6520 Calcific tendinitis, unspecified site: Secondary | ICD-10-CM | POA: Diagnosis not present

## 2016-01-29 DIAGNOSIS — M7530 Calcific tendinitis of unspecified shoulder: Secondary | ICD-10-CM | POA: Insufficient documentation

## 2016-01-29 MED ORDER — DICLOFENAC SODIUM 2 % TD SOLN: 2.0000 "application " | Freq: Two times a day (BID) | TRANSDERMAL | Status: DC

## 2016-01-29 MED ORDER — VITAMIN D (ERGOCALCIFEROL) 1.25 MG (50000 UNIT) PO CAPS: 50000.0000 [IU] | ORAL_CAPSULE | ORAL | Status: DC

## 2016-01-29 NOTE — Progress Notes (Signed)
[facility] [place], [place] [address] Phone: [phone] Subjective:    I'm seeing this patient by the request  of:  Walker Kehr, MD   CC: Right shoulder pain [id number] Amanda Pena is a 60 y.o. female coming in with complaint of right shoulder pain. Has been intermittent for approximately 1 year. Does not remember any true injury. States that the pain seems to be worse after playing tennis. Seems to radiate down the arm. Patient does have known arthritic changes of the neck but states that this does not seem to be related to the neck. Patient denies any weakness. States that there is a throbbing sensation. Hasn't worn a compression on the arm that seems to be helpful. Has also taken ibuprofen which does help but does not want to taking on a regular basis. Does not do any icing. Sometimes can wake her up at night. Rates the severity of pain a 6 out of 10.     Past Medical History  Diagnosis Date  . HYPERLIPIDEMIA 04/09/2008    Qualifier: Diagnosis of  By: Wynona Luna   . HYPOTHYROIDISM 04/09/2008    Qualifier: Diagnosis of  By: Wynona Luna   . INSOMNIA, CHRONIC 08/21/2010    Qualifier: Diagnosis of  By: Wynona Luna   . PERSONAL HISTORY MALIGNANT NEOPLASM THYROID 08/21/2010    Qualifier: Diagnosis of  By: Wynona Luna    Past Surgical History  Procedure Laterality Date  . Thyroidectomy, partial     Social History   Social History  . Marital Status: Married    Spouse Name: N/A  . Number of Children: N/A  . Years of Education: N/A   Social History Main Topics  . Smoking status: Never Smoker   . Smokeless tobacco: None  . Alcohol Use: Yes  . Drug Use: No  . Sexual Activity: Yes   Other Topics Concern  . None   Social History Narrative   No Known Allergies Family History  Problem Relation Age of Onset  . Arthritis Mother   . Hyperlipidemia Mother   . Hyperlipidemia Father   . Diabetes Father     . Hypertension Father   . Heart disease Father 66    CAD    Past medical history, social, surgical and family history all reviewed in electronic medical record.  No pertanent information unless stated regarding to the chief complaint.   Review of Systems: No headache, visual changes, nausea, vomiting, diarrhea, constipation, dizziness, abdominal pain, skin rash, fevers, chills, night sweats, weight loss, swollen lymph nodes, body aches, joint swelling, muscle aches, chest pain, shortness of breath, mood changes.   Objective Blood pressure 112/72, pulse 65, height 5\' 6"  (1.676 m), weight 133 lb (60.328 kg), SpO2 95 %.  General: No apparent distress alert and oriented x3 mood and affect normal, dressed appropriately.  HEENT: Pupils equal, extraocular movements intact  Respiratory: Patient's speak in full sentences and does not appear short of breath  Cardiovascular: No lower extremity edema, non tender, no erythema  Skin: Warm dry intact with no signs of infection or rash on extremities or on axial skeleton.  Abdomen: Soft nontender  Neuro: Cranial nerves II through XII are intact, neurovascularly intact in all extremities with 2+ DTRs and 2+ pulses.  Lymph: No lymphadenopathy of posterior or anterior cervical chain or axillae bilaterally.  Gait normal with good balance and coordination.  MSK:  Non tender with full  range of motion and good stability and symmetric strength and tone of  elbows, wrist, hip, knee and ankles bilaterally.  Neck: Inspection unremarkable. No palpable stepoffs. Negative Spurling's maneuver. Full neck range of motion Grip strength and sensation normal in bilateral hands Strength good C4 to T1 distribution No sensory change to C4 to T1 Negative Hoffman sign bilaterally Reflexes normal  Shoulder: Right Inspection reveals no abnormalities, atrophy or asymmetry. Palpation is normal with no tenderness over AC joint or bicipital groove. ROM is full in all planes  passively. Rotator cuff strength normal throughout. signs of impingement with positive Neer and Hawkin's tests, but negative empty can sign. Speeds and Yergason's tests normal. No labral pathology noted with negative Obrien's, negative clunk and good stability. Normal scapular function observed. No painful arc and no drop arm sign. No apprehension sign  MSK US performed of: Right This study was ordered, performed, and interpreted by Charlann Boxer D.O.  Shoulder:   Supraspinatus:  Appears normal on long and transverse views, Bursal bulge seen with shoulder abduction on impingement view. Calcific changes noted of the bursa as well as the tendon minorly Infraspinatus:  Appears normal on long and transverse views. Significant increase in Doppler flow mild calcific changes Subscapularis:  Appears normal on long and transverse views. Positive bursa also with calcific changes Teres Minor:  Appears normal on long and transverse views. AC joint:  Capsule undistended, no geyser sign. Glenohumeral Joint:  Appears normal without effusion. Glenoid Labrum: Mild degenerative changes Biceps Tendon:  Appears normal on long and transverse views, no fraying of tendon, tendon located in intertubercular groove, no subluxation with shoulder internal or external rotation.   Impression: Calcific Subacromial bursitis   Procedure note [id number]; 15 minutes spent for Therapeutic exercises as stated in above notes.  This included exercises focusing on stretching, strengthening, with significant focus on eccentric aspects.  Shoulder Exercises that included:  Basic scapular stabilization to include adduction and depression of scapula Scaption, focusing on proper movement and good control Internal and External rotation utilizing a theraband, with elbow tucked at side entire time Rows with theraband   Proper technique shown and discussed handout in great detail with ATC.  All questions were discussed and answered.       Impression and Recommendations:     This case required medical decision making of moderate complexity.      Note: This dictation was prepared with Dragon dictation along with smaller phrase technology. Any transcriptional errors that result from this process are unintentional.

## 2016-01-29 NOTE — Patient Instructions (Signed)
Good to see you.  Ice 20 minutes 2 times daily. Usually after activity and before bed. Exercises 3 times a week.  pennsaid pinkie amount topically 2 times daily as needed.  Avoid movement with hands outside peripheral vision.  One weekly vitamin D Turmeric 500mg  twice daily  See me again in 4 weeks.

## 2016-01-29 NOTE — Assessment & Plan Note (Signed)
Patient is a more calcific bursitis of the rotator cuff. Patient was put on once weekly vitamin D supplementation, we discussed icing regimen. Given topical anti-inflammatories. We discussed which activities to do in which ones to potentially avoid. 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 Seems to be stable overall he does have a negative Spurling's today. We will continue to monitor. If worsening pain we'll consider gabapentin. Patient may also need formal physical therapy and injection. Follow-up in 4 weeks

## 2016-01-29 NOTE — Progress Notes (Signed)
Pre visit review using our clinic review tool, if applicable. No additional management support is needed unless otherwise documented below in the visit note. 

## 2016-02-04 ENCOUNTER — Telehealth: Payer: Self-pay | Admitting: Family Medicine

## 2016-02-04 ENCOUNTER — Other Ambulatory Visit: Payer: Self-pay

## 2016-02-04 DIAGNOSIS — M25511 Pain in right shoulder: Secondary | ICD-10-CM

## 2016-02-04 MED ORDER — DICLOFENAC SODIUM 2 % TD SOLN: 2.0000 "application " | Freq: Two times a day (BID) | TRANSDERMAL | Status: DC

## 2016-02-04 MED ORDER — VITAMIN D (ERGOCALCIFEROL) 1.25 MG (50000 UNIT) PO CAPS: 50000.0000 [IU] | ORAL_CAPSULE | ORAL | Status: DC

## 2016-02-04 NOTE — Telephone Encounter (Signed)
Resent prescriptions.

## 2016-02-04 NOTE — Telephone Encounter (Signed)
Patient called to advise that she has not gotten the Vitamin D, Ergocalciferol, (DRISDOL) 50000 units CAPS capsule [id number] to [facility]. i [facility] [address], but she states that she requested we send to [facility] in [place]  Address: [address], [place], [place] [address] Departments: [place] Hours: Open today  Open 24 hours  See more hours Phone: [phone]  Also states that [facility] has not gotten the pennsaid RX to her to date. [facility]

## 2016-03-02 ENCOUNTER — Ambulatory Visit: Payer: BLUE CROSS/BLUE SHIELD | Admitting: Family Medicine

## 2016-03-10 NOTE — Progress Notes (Signed)
[facility] [place], [place] [address] Phone: [phone] Subjective:    I'm seeing this patient by the request  of:  Walker Kehr, MD   CC: Right shoulder pain Follow-up   [id number]  Amanda Pena is a 60 y.o. female coming in with complaint of right shoulder pain. Patient did see me in proximal only 6 weeks ago and was diagnosed with a calcific bursitis. Patient elected to try conservative therapy. Patient was to do icing, home exercises, topical anti-inflammatories as well as once weekly vitamin D. Patient states Overall seems to be doing relatively well. Patient does not think does she has made significant improvement. Patient states that if anything maybe a little worsening. States that is waking up her night. Any overhead activity such as playing tennis is worsening pain.      Past Medical History:  Diagnosis Date  . HYPERLIPIDEMIA 04/09/2008   Qualifier: Diagnosis of  By: Wynona Luna   . HYPOTHYROIDISM 04/09/2008   Qualifier: Diagnosis of  By: Wynona Luna   . INSOMNIA, CHRONIC 08/21/2010   Qualifier: Diagnosis of  By: Wynona Luna   . PERSONAL HISTORY MALIGNANT NEOPLASM THYROID 08/21/2010   Qualifier: Diagnosis of  By: Wynona Luna    Past Surgical History:  Procedure Laterality Date  . THYROIDECTOMY, PARTIAL     Social History   Social History  . Marital status: Married    Spouse name: N/A  . Number of children: N/A  . Years of education: N/A   Social History Main Topics  . Smoking status: Never Smoker  . Smokeless tobacco: None  . Alcohol use Yes  . Drug use: No  . Sexual activity: Yes   Other Topics Concern  . None   Social History Narrative  . None   No Known Allergies Family History  Problem Relation Age of Onset  . Arthritis Mother   . Hyperlipidemia Mother   . Hyperlipidemia Father   . Diabetes Father   . Hypertension Father   . Heart disease Father 33    CAD    Past  medical history, social, surgical and family history all reviewed in electronic medical record.  No pertanent information unless stated regarding to the chief complaint.   Review of Systems: No headache, visual changes, nausea, vomiting, diarrhea, constipation, dizziness, abdominal pain, skin rash, fevers, chills, night sweats, weight loss, swollen lymph nodes, body aches, joint swelling, muscle aches, chest pain, shortness of breath, mood changes.   Objective  Blood pressure 120/80, pulse 93, weight 131 lb (59.4 kg), SpO2 96 %.  General: No apparent distress alert and oriented x3 mood and affect normal, dressed appropriately.  HEENT: Pupils equal, extraocular movements intact  Respiratory: Patient's speak in full sentences and does not appear short of breath  Cardiovascular: No lower extremity edema, non tender, no erythema  Skin: Warm dry intact with no signs of infection or rash on extremities or on axial skeleton.  Abdomen: Soft nontender  Neuro: Cranial nerves II through XII are intact, neurovascularly intact in all extremities with 2+ DTRs and 2+ pulses.  Lymph: No lymphadenopathy of posterior or anterior cervical chain or axillae bilaterally.  Gait normal with good balance and coordination.  MSK:  Non tender with full range of motion and good stability and symmetric strength and tone of  elbows, wrist, hip, knee and ankles bilaterally.  Neck: Inspection unremarkable. No palpable stepoffs. Negative Spurling's maneuver. Full neck  range of motion Grip strength and sensation normal in bilateral hands Strength good C4 to T1 distribution No sensory change to C4 to T1 Negative Hoffman sign bilaterally Reflexes normal  Shoulder: Right Inspection reveals no abnormalities, atrophy or asymmetry. Palpation is normal with no tenderness over AC joint or bicipital groove. ROM is full in all planes passively. Rotator cuff strength normal throughout. signs of impingement with positive Neer and  Hawkin's tests, but negative empty can sign. Speeds and Yergason's tests normal. No labral pathology noted with negative Obrien's, negative clunk and good stability. Normal scapular function observed. No painful arc and no drop arm sign. No apprehension sign Contralateral shoulder unremarkable No significant change from previous exam  Procedure: Real-time Ultrasound Guided Injection of right glenohumeral joint Device: GE Logiq E  Ultrasound guided injection is preferred based studies that show increased duration, increased effect, greater accuracy, decreased procedural pain, increased response rate with ultrasound guided versus blind injection.  Verbal informed consent obtained.  Time-out conducted.  Noted no overlying erythema, induration, or other signs of local infection.  Skin prepped in a sterile fashion.  Local anesthesia: Topical Ethyl chloride.  With sterile technique and under real time ultrasound guidance:  Joint visualized.  23g 1  inch needle inserted posterior approach. Pictures taken for needle placement. Patient did have injection of 2 cc of 1% lidocaine, 2 cc of 0.5% Marcaine, and 1.0 cc of Kenalog 40 mg/dL. Completed without difficulty  Pain immediately resolved suggesting accurate placement of the medication.  Advised to call if fevers/chills, erythema, induration, drainage, or persistent bleeding.  Images permanently stored and available for review in the ultrasound unit.  Impression: Technically successful ultrasound guided injection.     Impression and Recommendations:     This case required medical decision making of moderate complexity.      Note: This dictation was prepared with Dragon dictation along with smaller phrase technology. Any transcriptional errors that result from this process are unintentional.

## 2016-03-11 ENCOUNTER — Ambulatory Visit (INDEPENDENT_AMBULATORY_CARE_PROVIDER_SITE_OTHER): Payer: BLUE CROSS/BLUE SHIELD | Admitting: Family Medicine

## 2016-03-11 ENCOUNTER — Encounter: Payer: Self-pay | Admitting: Family Medicine

## 2016-03-11 DIAGNOSIS — M6520 Calcific tendinitis, unspecified site: Secondary | ICD-10-CM

## 2016-03-11 DIAGNOSIS — M7530 Calcific tendinitis of unspecified shoulder: Secondary | ICD-10-CM

## 2016-03-11 MED ORDER — GABAPENTIN 100 MG PO CAPS: 100.0000 mg | ORAL_CAPSULE | Freq: Two times a day (BID) | ORAL | 3 refills | Status: DC

## 2016-03-11 MED ORDER — ONDANSETRON 4 MG PO TBDP: 4.0000 mg | ORAL_TABLET | Freq: Three times a day (TID) | ORAL | 0 refills | Status: DC | PRN

## 2016-03-11 MED ORDER — PREDNISONE 20 MG PO TABS: 40.0000 mg | ORAL_TABLET | Freq: Every day | ORAL | 0 refills | Status: DC

## 2016-03-11 NOTE — Assessment & Plan Note (Signed)
Patient given injection today and tolerated the procedure fairly well. We discussed icing regimen and home exercises. We discussed which activities to do in which ones to avoid. Patient continue with once weekly vitamin D. Patient declined formal physical therapy. Did respond fairly well to the injection she continues to do well. Patient will follow-up and see me again in 4 weeks.

## 2016-03-11 NOTE — Patient Instructions (Addendum)
Good to see you  Ice is your friend, especially in 6 hours.  Continue the vitamin D Injected shoulder and should help Keep hands within peripheral vision.  See me again in 4 weeks and send me a message in 2 weeks and if not better then consider physical therapy  .

## 2016-03-24 ENCOUNTER — Other Ambulatory Visit: Payer: Self-pay | Admitting: *Deleted

## 2016-03-24 MED ORDER — SYNTHROID 75 MCG PO TABS: ORAL_TABLET | ORAL | 0 refills | Status: DC

## 2016-04-13 ENCOUNTER — Encounter: Payer: Self-pay | Admitting: Family Medicine

## 2016-04-13 DIAGNOSIS — M7530 Calcific tendinitis of unspecified shoulder: Secondary | ICD-10-CM

## 2016-05-11 ENCOUNTER — Ambulatory Visit: Payer: BLUE CROSS/BLUE SHIELD

## 2016-05-13 ENCOUNTER — Ambulatory Visit: Payer: BLUE CROSS/BLUE SHIELD | Admitting: Sports Medicine

## 2016-06-22 ENCOUNTER — Encounter: Payer: Self-pay | Admitting: Family

## 2016-06-22 ENCOUNTER — Telehealth: Payer: Self-pay

## 2016-06-22 ENCOUNTER — Ambulatory Visit (INDEPENDENT_AMBULATORY_CARE_PROVIDER_SITE_OTHER): Payer: BLUE CROSS/BLUE SHIELD | Admitting: Family

## 2016-06-22 ENCOUNTER — Other Ambulatory Visit (INDEPENDENT_AMBULATORY_CARE_PROVIDER_SITE_OTHER): Payer: BLUE CROSS/BLUE SHIELD

## 2016-06-22 VITALS — BP 148/92 | HR 86 | Temp 98.0°F | Resp 16 | Ht 66.0 in | Wt 131.0 lb

## 2016-06-22 DIAGNOSIS — J0140 Acute pansinusitis, unspecified: Secondary | ICD-10-CM

## 2016-06-22 DIAGNOSIS — Z0000 Encounter for general adult medical examination without abnormal findings: Secondary | ICD-10-CM | POA: Diagnosis not present

## 2016-06-22 LAB — CBC WITH DIFFERENTIAL/PLATELET
Basophils Absolute: 0 10*3/uL (ref 0.0–0.1)
Basophils Relative: 0.4 % (ref 0.0–3.0)
Eosinophils Absolute: 0.2 10*3/uL (ref 0.0–0.7)
Eosinophils Relative: 2.4 % (ref 0.0–5.0)
HCT: 42.3 % (ref 36.0–46.0)
Hemoglobin: 14.3 g/dL (ref 12.0–15.0)
Lymphocytes Relative: 20 % (ref 12.0–46.0)
Lymphs Abs: 1.5 10*3/uL (ref 0.7–4.0)
MCHC: 33.8 g/dL (ref 30.0–36.0)
MCV: 89.1 fl (ref 78.0–100.0)
Monocytes Absolute: 0.4 10*3/uL (ref 0.1–1.0)
Monocytes Relative: 5.1 % (ref 3.0–12.0)
Neutro Abs: 5.3 10*3/uL (ref 1.4–7.7)
Neutrophils Relative %: 72.1 % (ref 43.0–77.0)
Platelets: 223 10*3/uL (ref 150.0–400.0)
RBC: 4.75 Mil/uL (ref 3.87–5.11)
RDW: 12.7 % (ref 11.5–15.5)
WBC: 7.3 10*3/uL (ref 4.0–10.5)

## 2016-06-22 LAB — BASIC METABOLIC PANEL
BUN: 10 mg/dL (ref 6–23)
CO2: 29 mEq/L (ref 19–32)
Calcium: 9.6 mg/dL (ref 8.4–10.5)
Chloride: 104 mEq/L (ref 96–112)
Creatinine, Ser: 0.8 mg/dL (ref 0.40–1.20)
GFR: 78.21 mL/min (ref >60.00)
Glucose, Bld: 101 mg/dL — ABNORMAL HIGH (ref 70–99)
Potassium: 4.1 mEq/L (ref 3.5–5.1)
Sodium: 141 mEq/L (ref 135–145)

## 2016-06-22 LAB — TSH: TSH: 0.83 u[IU]/mL (ref 0.35–4.50)

## 2016-06-22 LAB — HEPATIC FUNCTION PANEL
ALT: 14 U/L (ref 0–35)
AST: 16 U/L (ref 0–37)
Albumin: 4.2 g/dL (ref 3.5–5.2)
Alkaline Phosphatase: 67 U/L (ref 39–117)
Bilirubin, Direct: 0.1 mg/dL (ref 0.0–0.3)
Total Bilirubin: 0.5 mg/dL (ref 0.2–1.2)
Total Protein: 7.3 g/dL (ref 6.0–8.3)

## 2016-06-22 LAB — LIPID PANEL
Cholesterol: 246 mg/dL — ABNORMAL HIGH (ref 0–200)
HDL: 50.1 mg/dL (ref >39.00)
LDL Cholesterol: 168 mg/dL — ABNORMAL HIGH (ref 0–99)
NonHDL: 196.35
Total CHOL/HDL Ratio: 5
Triglycerides: 144 mg/dL (ref 0.0–149.0)
VLDL: 28.8 mg/dL (ref 0.0–40.0)

## 2016-06-22 MED ORDER — AMOXICILLIN-POT CLAVULANATE 875-125 MG PO TABS
1.0000 | ORAL_TABLET | Freq: Two times a day (BID) | ORAL | 0 refills | Status: DC
Start: 2016-06-22 — End: 2016-09-14

## 2016-06-22 NOTE — Progress Notes (Signed)
Subjective:    Patient ID: Amanda Pena, female    DOB: May 01, 1956, 60 y.o.   MRN: [id number]  Chief Complaint  Patient presents with  . Nasal Congestion    chest congestion and head congestion that she states has been there for a while, mucus is not clear, cough    HPI:  Amanda Pena is a 60 y.o. female who  has a past medical history of HYPERLIPIDEMIA (04/09/2008); HYPOTHYROIDISM (04/09/2008); INSOMNIA, CHRONIC (08/21/2010); and PERSONAL HISTORY MALIGNANT NEOPLASM THYROID (08/21/2010). and presents today for an acute Office visit.  This is a new problem. Associated symptoms of chest congestion, head congestion and cough have been going on for a couple of months that has continued to wax and wane. Cough is productive with yellow mucus production. Denies fevers. Modifying factors include decongestants and allergy medications which have not helped very much. She has also taken Echinacea which has not helped either. No recent antibiotics. Does have a sick 70 year old grandson.    No Known Allergies   Outpatient Medications Prior to Visit  Medication Sig Dispense Refill  . Ascorbic Acid (VITAMIN C) 1000 MG tablet Take 1,000 mg by mouth daily.    . Cholecalciferol 2000 UNITS CAPS Take 1-2 each by mouth daily.     . Omega-3 Fatty Acids (FISH OIL) 1000 MG CAPS Take 2 capsules by mouth daily.    Marland Kitchen SYNTHROID 75 MCG tablet TAKE ONE TABLET BY MOUTH ONCE DAILY 90 tablet 0  . Vitamin D, Ergocalciferol, (DRISDOL) 50000 units CAPS capsule Take 1 capsule (50,000 Units total) by mouth every 7 (seven) days. 12 capsule 0  . Diclofenac Sodium (PENNSAID) 2 % SOLN Place 2 application onto the skin 2 (two) times daily. 112 g 3  . gabapentin (NEURONTIN) 100 MG capsule Take 1 capsule (100 mg total) by mouth 2 (two) times daily. 60 capsule 3  . ondansetron (ZOFRAN ODT) 4 MG disintegrating tablet Take 1 tablet (4 mg total) by mouth every 8 (eight) hours as needed for nausea or vomiting. 20 tablet 0  .  predniSONE (DELTASONE) 20 MG tablet Take 2 tablets (40 mg total) by mouth daily with breakfast. 10 tablet 0   No facility-administered medications prior to visit.      Review of Systems  Constitutional: Negative for chills and fever.  HENT: Positive for congestion and sore throat. Negative for ear pain, sinus pain and sinus pressure.   Respiratory: Positive for cough. Negative for chest tightness and shortness of breath.   Neurological: Negative for headaches.      Objective:    BP (!) 148/92 (BP Location: Left Arm, Patient Position: Sitting, Cuff Size: Normal)   Pulse 86   Temp 98 F (36.7 C) (Oral)   Resp 16   Ht 5\' 6"  (1.676 m)   Wt 131 lb (59.4 kg)   SpO2 98%   BMI 21.14 kg/m  Nursing note and vital signs reviewed.  Physical Exam  Constitutional: She is oriented to person, place, and time. She appears well-developed and well-nourished.  Non-toxic appearance. She does not have a sickly appearance. She does not appear ill. No distress.  HENT:  Right Ear: Hearing, tympanic membrane, external ear and ear canal normal.  Left Ear: Hearing, tympanic membrane, external ear and ear canal normal.  Nose: Right sinus exhibits no maxillary sinus tenderness and no frontal sinus tenderness. Left sinus exhibits no maxillary sinus tenderness and no frontal sinus tenderness.  Mouth/Throat: Uvula is midline, oropharynx is clear and  moist and mucous membranes are normal.  Neck: Neck supple.  Cardiovascular: Normal rate, regular rhythm, normal heart sounds and intact distal pulses.   Pulmonary/Chest: Effort normal and breath sounds normal.  Neurological: She is alert and oriented to person, place, and time.  Skin: Skin is warm and dry.       Assessment & Plan:   Problem List Items Addressed This Visit      Respiratory   Sinusitis - Primary    Symptoms and exam consistent with acute sinusitis. Start Augmentin. Continue over-the-counter medications as needed for symptom relief and  supportive care. Follow-up if symptoms worsen or do not improve.      Relevant Medications   amoxicillin-clavulanate (AUGMENTIN) 875-125 MG tablet       I have discontinued Ms. Recktenwald's Diclofenac Sodium, gabapentin, ondansetron, and predniSONE. I am also having her start on amoxicillin-clavulanate. Additionally, I am having her maintain her Fish Oil, Cholecalciferol, vitamin C, Vitamin D (Ergocalciferol), and SYNTHROID.   Meds ordered this encounter  Medications  . amoxicillin-clavulanate (AUGMENTIN) 875-125 MG tablet    Sig: Take 1 tablet by mouth 2 (two) times daily.    Dispense:  20 tablet    Refill:  0    Order Specific Question:   Supervising Provider    Answer:   Pricilla Holm A [id number]     Follow-up: Return if symptoms worsen or fail to improve.  Mauricio Po, FNP

## 2016-06-22 NOTE — Assessment & Plan Note (Signed)
Symptoms and exam consistent with acute sinusitis. Start Augmentin. Continue over-the-counter medications as needed for symptom relief and supportive care. Follow-up if symptoms worsen or do not improve.

## 2016-06-22 NOTE — Telephone Encounter (Signed)
Pt is requesting labs before physical on Friday.   Please advise if labs are okay to enter for CPE.

## 2016-06-22 NOTE — Patient Instructions (Signed)
Thank you for choosing [facility].  SUMMARY AND INSTRUCTIONS:  Medication:  Your prescription(s) have been submitted to your pharmacy or been printed and provided for you. Please take as directed and contact our office if you believe you are having problem(s) with the medication(s) or have any questions.   Follow up:  If your symptoms worsen or fail to improve, please contact our office for further instruction, or in case of emergency go directly to the emergency room at the closest medical facility.     Sinusitis Sinusitis is redness, soreness, and inflammation of the paranasal sinuses. Paranasal sinuses are air pockets within the bones of your face (beneath the eyes, the middle of the forehead, or above the eyes). In healthy paranasal sinuses, mucus is able to drain out, and air is able to circulate through them by way of your nose. However, when your paranasal sinuses are inflamed, mucus and air can become trapped. This can allow bacteria and other germs to grow and cause infection. Sinusitis can develop quickly and last only a short time (acute) or continue over a long period (chronic). Sinusitis that lasts for more than 12 weeks is considered chronic.  CAUSES  Causes of sinusitis include:  Allergies.  Structural abnormalities, such as displacement of the cartilage that separates your nostrils (deviated septum), which can decrease the air flow through your nose and sinuses and affect sinus drainage.  Functional abnormalities, such as when the small hairs (cilia) that line your sinuses and help remove mucus do not work properly or are not present. SIGNS AND SYMPTOMS  Symptoms of acute and chronic sinusitis are the same. The primary symptoms are pain and pressure around the affected sinuses. Other symptoms include:  Upper toothache.  Earache.  Headache.  Bad breath.  Decreased sense of smell and taste.  A cough, which worsens when you are lying  flat.  Fatigue.  Fever.  Thick drainage from your nose, which often is green and may contain pus (purulent).  Swelling and warmth over the affected sinuses. DIAGNOSIS  Your health care provider will perform a physical exam. During the exam, your health care provider may:  Look in your nose for signs of abnormal growths in your nostrils (nasal polyps).  Tap over the affected sinus to check for signs of infection.  View the inside of your sinuses (endoscopy) using an imaging device that has a light attached (endoscope). If your health care provider suspects that you have chronic sinusitis, one or more of the following tests may be recommended:  Allergy tests.  Nasal culture. A sample of mucus is taken from your nose, sent to a lab, and screened for bacteria.  Nasal cytology. A sample of mucus is taken from your nose and examined by your health care provider to determine if your sinusitis is related to an allergy. TREATMENT  Most cases of acute sinusitis are related to a viral infection and will resolve on their own within 10 days. Sometimes medicines are prescribed to help relieve symptoms (pain medicine, decongestants, nasal steroid sprays, or saline sprays).  However, for sinusitis related to a bacterial infection, your health care provider will prescribe antibiotic medicines. These are medicines that will help kill the bacteria causing the infection.  Rarely, sinusitis is caused by a fungal infection. In theses cases, your health care provider will prescribe antifungal medicine. For some cases of chronic sinusitis, surgery is needed. Generally, these are cases in which sinusitis recurs more than 3 times per year, despite other treatments. HOME  CARE INSTRUCTIONS   Drink plenty of water. Water helps thin the mucus so your sinuses can drain more easily.  Use a humidifier.  Inhale steam 3 to 4 times a day (for example, sit in the bathroom with the shower running).  Apply a warm,  moist washcloth to your face 3 to 4 times a day, or as directed by your health care provider.  Use saline nasal sprays to help moisten and clean your sinuses.  Take medicines only as directed by your health care provider.  If you were prescribed either an antibiotic or antifungal medicine, finish it all even if you start to feel better. SEEK IMMEDIATE MEDICAL CARE IF:  You have increasing pain or severe headaches.  You have nausea, vomiting, or drowsiness.  You have swelling around your face.  You have vision problems.  You have a stiff neck.  You have difficulty breathing. MAKE SURE YOU:   Understand these instructions.  Will watch your condition.  Will get help right away if you are not doing well or get worse. Document Released: 07/06/2005 Document Revised: 11/20/2013 Document Reviewed: 07/21/2011 [facility] Patient Information 2015 [place], [place]. This information is not intended to replace advice given to you by your health care provider. Make sure you discuss any questions you have with your health care provider.

## 2016-06-22 NOTE — Telephone Encounter (Signed)
Done. Thx.

## 2016-06-25 ENCOUNTER — Ambulatory Visit (INDEPENDENT_AMBULATORY_CARE_PROVIDER_SITE_OTHER): Payer: BLUE CROSS/BLUE SHIELD | Admitting: Internal Medicine

## 2016-06-25 ENCOUNTER — Encounter: Payer: Self-pay | Admitting: Internal Medicine

## 2016-06-25 DIAGNOSIS — Z0000 Encounter for general adult medical examination without abnormal findings: Secondary | ICD-10-CM | POA: Diagnosis not present

## 2016-06-25 MED ORDER — SYNTHROID 75 MCG PO TABS: ORAL_TABLET | ORAL | 3 refills | Status: DC

## 2016-06-25 NOTE — Assessment & Plan Note (Signed)
We discussed age appropriate health related issues, including available/recomended screening tests and vaccinations. We discussed a need for adhering to healthy diet and exercise. Labs were reviewed. All questions were answered. GYN, Ophth q 12 mo Colon 2014

## 2016-06-25 NOTE — Progress Notes (Addendum)
Subjective:  Patient ID: Amanda Pena, female    DOB: 1955/11/10  Age: 60 y.o. MRN: [id number]  CC: No chief complaint on file.   HPI [name] presents for a well exam  Outpatient Medications Prior to Visit  Medication Sig Dispense Refill  . amoxicillin-clavulanate (AUGMENTIN) 875-125 MG tablet Take 1 tablet by mouth 2 (two) times daily. 20 tablet 0  . Ascorbic Acid (VITAMIN C) 1000 MG tablet Take 1,000 mg by mouth daily.    . Cholecalciferol 2000 UNITS CAPS Take 1-2 each by mouth daily.     . Omega-3 Fatty Acids (FISH OIL) 1000 MG CAPS Take 2 capsules by mouth daily.    Marland Kitchen SYNTHROID 75 MCG tablet TAKE ONE TABLET BY MOUTH ONCE DAILY 90 tablet 0  . Vitamin D, Ergocalciferol, (DRISDOL) 50000 units CAPS capsule Take 1 capsule (50,000 Units total) by mouth every 7 (seven) days. 12 capsule 0   No facility-administered medications prior to visit.     ROS Review of Systems  Constitutional: Negative for activity change, appetite change, chills, fatigue and unexpected weight change.  HENT: Negative for congestion, mouth sores and sinus pressure.   Eyes: Negative for visual disturbance.  Respiratory: Negative for cough and chest tightness.   Gastrointestinal: Negative for abdominal pain and nausea.  Genitourinary: Negative for difficulty urinating, frequency and vaginal pain.  Musculoskeletal: Positive for arthralgias. Negative for back pain and gait problem.  Skin: Negative for pallor and rash.  Neurological: Negative for dizziness, tremors, weakness, numbness and headaches.  Psychiatric/Behavioral: Negative for confusion and sleep disturbance.    Objective:  BP 120/84   Pulse 86   Ht 5\' 5"  (1.651 m)   Wt 134 lb (60.8 kg)   SpO2 95%   BMI 22.30 kg/m   BP Readings from Last 3 Encounters:  06/25/16 120/84  06/22/16 (!) 148/92  03/11/16 120/80    Wt Readings from Last 3 Encounters:  06/25/16 134 lb (60.8 kg)  06/22/16 131 lb (59.4 kg)  03/11/16 131 lb (59.4  kg)    Physical Exam  Constitutional: She appears well-developed. No distress.  HENT:  Head: Normocephalic.  Right Ear: External ear normal.  Left Ear: External ear normal.  Nose: Nose normal.  Mouth/Throat: Oropharynx is clear and moist.  Eyes: Conjunctivae are normal. Pupils are equal, round, and reactive to light. Right eye exhibits no discharge. Left eye exhibits no discharge.  Neck: Normal range of motion. Neck supple. No JVD present. No tracheal deviation present. No thyromegaly present.  Cardiovascular: Normal rate, regular rhythm and normal heart sounds.   Pulmonary/Chest: No stridor. No respiratory distress. She has no wheezes.  Abdominal: Soft. Bowel sounds are normal. She exhibits no distension and no mass. There is no tenderness. There is no rebound and no guarding.  Musculoskeletal: She exhibits tenderness. She exhibits no edema.  Lymphadenopathy:    She has no cervical adenopathy.  Neurological: She displays normal reflexes. No cranial nerve deficit. She exhibits normal muscle tone. Coordination normal.  Skin: No rash noted. No erythema.  Psychiatric: She has a normal mood and affect. Her behavior is normal. Judgment and thought content normal.   R inner knee is tender Lab Results  Component Value Date   WBC 7.3 06/22/2016   HGB 14.3 06/22/2016   HCT 42.3 06/22/2016   PLT 223.0 06/22/2016   GLUCOSE 101 (H) 06/22/2016   CHOL 246 (H) 06/22/2016   TRIG 144.0 06/22/2016   HDL 50.10 06/22/2016   LDLDIRECT 186.2 04/16/2008  LDLCALC 168 (H) 06/22/2016   ALT 14 06/22/2016   AST 16 06/22/2016   NA 141 06/22/2016   K 4.1 06/22/2016   CL 104 06/22/2016   CREATININE 0.80 06/22/2016   BUN 10 06/22/2016   CO2 29 06/22/2016   TSH 0.83 06/22/2016    Dg Chest 2 View  Result Date: 10/15/2014 CLINICAL DATA:  Cough for 2 weeks. EXAM: CHEST  2 VIEW COMPARISON:  04/16/2008 FINDINGS: The heart size and mediastinal contours are within normal limits. Both lungs are clear. The  visualized skeletal structures are unremarkable. IMPRESSION: No active cardiopulmonary disease. Electronically Signed   By: Rolm Baptise M.D.   On: 10/15/2014 16:58   Dg Cervical Spine Complete  Result Date: 10/15/2014 CLINICAL DATA:  Patient with posterior cervical pain for 3 weeks. History of partial thyroidectomy. EXAM: CERVICAL SPINE  4+ VIEWS COMPARISON:  None. FINDINGS: Visualization through C7 vertebral body on lateral view. Multilevel degenerative disc disease most pronounced at C5-6 and C6-7. Normal anatomic alignment. No evidence for acute fracture. Craniocervical junction unremarkable. Prevertebral soft tissues unremarkable. Surgical clips within the left cervical soft tissues, compatible with history of partial thyroidectomy. Lung apices are unremarkable. Multilevel osseous neural foraminal narrowing. IMPRESSION: Multilevel degenerative disc disease.  No acute osseous abnormality. Electronically Signed   By: Lovey Newcomer M.D.   On: 10/15/2014 17:01    Assessment & Plan:   There are no diagnoses linked to this encounter. I have discontinued Ms. Nuno's Vitamin D (Ergocalciferol). I am also having her maintain her Fish Oil, Cholecalciferol, vitamin C, SYNTHROID, amoxicillin-clavulanate, and zolpidem.  Meds ordered this encounter  Medications  . zolpidem (AMBIEN) 10 MG tablet    Sig: Take 10 mg by mouth at bedtime.    Refill:  5     Follow-up: No Follow-up on file.  Walker Kehr, MD

## 2016-06-25 NOTE — Progress Notes (Signed)
Pre visit review using our clinic review tool, if applicable. No additional management support is needed unless otherwise documented below in the visit note. 

## 2016-06-30 ENCOUNTER — Encounter: Payer: Self-pay | Admitting: Internal Medicine

## 2016-08-14 ENCOUNTER — Other Ambulatory Visit: Payer: Self-pay | Admitting: Internal Medicine

## 2016-09-14 ENCOUNTER — Encounter: Payer: Self-pay | Admitting: Internal Medicine

## 2016-09-14 ENCOUNTER — Ambulatory Visit (INDEPENDENT_AMBULATORY_CARE_PROVIDER_SITE_OTHER)
Admission: RE | Admit: 2016-09-14 | Discharge: 2016-09-14 | Disposition: A | Discharge status: Home / Self Care | Payer: Self-pay | Source: Ambulatory Visit | Attending: Internal Medicine | Admitting: Internal Medicine

## 2016-09-14 ENCOUNTER — Telehealth: Payer: Self-pay | Admitting: Internal Medicine

## 2016-09-14 ENCOUNTER — Ambulatory Visit (INDEPENDENT_AMBULATORY_CARE_PROVIDER_SITE_OTHER): Payer: Self-pay | Admitting: Internal Medicine

## 2016-09-14 DIAGNOSIS — J0140 Acute pansinusitis, unspecified: Secondary | ICD-10-CM

## 2016-09-14 MED ORDER — CETIRIZINE HCL 10 MG PO TABS: 10.0000 mg | ORAL_TABLET | Freq: Every day | ORAL | 1 refills | Status: DC

## 2016-09-14 MED ORDER — AMOXICILLIN-POT CLAVULANATE 875-125 MG PO TABS: 1.0000 | ORAL_TABLET | Freq: Two times a day (BID) | ORAL | 0 refills | Status: DC

## 2016-09-14 NOTE — Patient Instructions (Signed)
We have sent in the antibiotic called augmentin for the sinuses. Take 1 pill twice a day for 10 days.   Also start taking allergy medicine like zyrtec (also called cetirizine) for the next 2 weeks or so to dry up the sinuses to help prevent this from coming back.   We are checking the chest x-ray today.

## 2016-09-14 NOTE — Progress Notes (Signed)
Pre visit review using our clinic review tool, if applicable. No additional management support is needed unless otherwise documented below in the visit note. 

## 2016-09-14 NOTE — Assessment & Plan Note (Signed)
Rx for augmentin and advised to start taking zyrtec. She will get CXR for the cough 2 weeks although lung exam normal. Can use sudafed and mucinex otc if she wants. No indication for steroids or cough medicine at this time.

## 2016-09-14 NOTE — Telephone Encounter (Signed)
Sent in cetirizine (this is zyrtec generic).

## 2016-09-14 NOTE — Telephone Encounter (Signed)
Patient was just here to see Dr. Sharlet Salina. She told her to take some zyrtec otc. The pharmacy told her if she got a rx for it, it would only cost her $3 otherwise it will cost her $27+. Could you please send a rx to her pharmacy for zyrtec. Thank you.   [facility] [phone] [place].

## 2016-09-14 NOTE — Progress Notes (Signed)
   Subjective:    Patient ID: Amanda Pena, female    DOB: [birth year], 62 y.o.   MRN: [id number]  HPI The patient is a 61 YO female coming in for cough and congestion going on for about 2 weeks. She is having some fevers as well. Overall symptoms are worsening or stable. Taking mucinex and sudafed. Feels like she has had this every month since September. She was treated for it back in December. She is not taking any allergy medication. Gets full relief (about 85%) between flares. This episode is not going away like it should. Still having dry cough, nasal drainage, sinus pressure, intermittent fevers.   Review of Systems  Constitutional: Positive for activity change, fatigue and fever. Negative for appetite change and chills.  HENT: Positive for congestion, postnasal drip, rhinorrhea, sinus pressure and sore throat. Negative for dental problem, drooling, ear discharge, ear pain, sinus pain and trouble swallowing.   Eyes: Negative.   Respiratory: Positive for cough. Negative for chest tightness, shortness of breath and wheezing.   Cardiovascular: Negative.   Gastrointestinal: Negative.   Musculoskeletal: Negative.   Neurological: Negative.       Objective:   Physical Exam  Constitutional: She is oriented to person, place, and time. She appears well-developed and well-nourished.  HENT:  Head: Normocephalic and atraumatic.  Right Ear: External ear normal.  Left Ear: External ear normal.  Oropharynx with clear drainage, no nasal crusting, minimal frontal tenderness.   Eyes: EOM are normal.  Neck: Normal range of motion. No JVD present.  Cardiovascular: Normal rate and regular rhythm.   Pulmonary/Chest: Effort normal and breath sounds normal. No respiratory distress. She has no wheezes. She has no rales.  Abdominal: Soft.  Lymphadenopathy:    She has no cervical adenopathy.  Neurological: She is alert and oriented to person, place, and time.  Skin: Skin is warm and dry.   Vitals:     09/14/16 1114  BP: 140/90  Pulse: 84  Temp: 98.3 F (36.8 C)  TempSrc: Oral  SpO2: 98%  Weight: 133 lb (60.3 kg)  Height: 5\' 5"  (1.651 m)      Assessment & Plan:

## 2016-09-14 NOTE — Telephone Encounter (Signed)
error 

## 2016-09-14 NOTE — Telephone Encounter (Signed)
Patient informed. Thank you

## 2016-10-11 ENCOUNTER — Other Ambulatory Visit: Payer: Self-pay | Admitting: Internal Medicine

## 2017-07-21 LAB — HM PAP SMEAR: HM Pap smear: NEGATIVE

## 2017-08-07 ENCOUNTER — Other Ambulatory Visit: Payer: Self-pay | Admitting: Internal Medicine

## 2018-05-17 ENCOUNTER — Other Ambulatory Visit: Payer: Self-pay | Admitting: Internal Medicine

## 2018-07-25 ENCOUNTER — Telehealth: Payer: Self-pay | Admitting: Internal Medicine

## 2018-07-25 DIAGNOSIS — Z0000 Encounter for general adult medical examination without abnormal findings: Secondary | ICD-10-CM

## 2018-07-25 NOTE — Telephone Encounter (Signed)
Copied from [place] [phone]. Topic: General - Other >> Jul 25, 2018 10:34 AM Burchel, Abbi R wrote: Reason for CRM:   Pt is self pay, and is requesting to have CPE labs done before her appt.  Please call pt when orders are placed and let her know when she can come in to have labs drawn.

## 2018-07-27 ENCOUNTER — Other Ambulatory Visit: Payer: Self-pay | Admitting: Internal Medicine

## 2018-08-01 NOTE — Telephone Encounter (Signed)
Ok Thx 

## 2018-08-02 NOTE — Telephone Encounter (Signed)
Called pt no answer LMOM MD has entered labs in system.Marland KitchenJohny Pena

## 2018-08-03 ENCOUNTER — Other Ambulatory Visit (INDEPENDENT_AMBULATORY_CARE_PROVIDER_SITE_OTHER): Payer: Self-pay

## 2018-08-03 ENCOUNTER — Other Ambulatory Visit: Payer: Self-pay | Admitting: Internal Medicine

## 2018-08-03 DIAGNOSIS — Z0000 Encounter for general adult medical examination without abnormal findings: Secondary | ICD-10-CM

## 2018-08-03 LAB — CBC WITH DIFFERENTIAL/PLATELET
Basophils Absolute: 0 10*3/uL (ref 0.0–0.1)
Basophils Relative: 0.5 % (ref 0.0–3.0)
Eosinophils Absolute: 0.1 10*3/uL (ref 0.0–0.7)
Eosinophils Relative: 2.7 % (ref 0.0–5.0)
HCT: 42.1 % (ref 36.0–46.0)
Hemoglobin: 14.5 g/dL (ref 12.0–15.0)
Lymphocytes Relative: 27.6 % (ref 12.0–46.0)
Lymphs Abs: 1.3 10*3/uL (ref 0.7–4.0)
MCHC: 34.4 g/dL (ref 30.0–36.0)
MCV: 89.9 fl (ref 78.0–100.0)
Monocytes Absolute: 0.4 10*3/uL (ref 0.1–1.0)
Monocytes Relative: 8.4 % (ref 3.0–12.0)
Neutro Abs: 2.8 10*3/uL (ref 1.4–7.7)
Neutrophils Relative %: 60.8 % (ref 43.0–77.0)
Platelets: 182 10*3/uL (ref 150.0–400.0)
RBC: 4.69 Mil/uL (ref 3.87–5.11)
RDW: 13.2 % (ref 11.5–15.5)
WBC: 4.7 10*3/uL (ref 4.0–10.5)

## 2018-08-03 LAB — URINALYSIS, ROUTINE W REFLEX MICROSCOPIC
Hgb urine dipstick: NEGATIVE
Ketones, ur: NEGATIVE
Nitrite: NEGATIVE
Specific Gravity, Urine: 1.02 (ref 1.000–1.030)
Total Protein, Urine: NEGATIVE
Urine Glucose: NEGATIVE
Urobilinogen, UA: 0.2 (ref 0.0–1.0)
pH: 5.5 (ref 5.0–8.0)

## 2018-08-03 LAB — HEPATIC FUNCTION PANEL
ALT: 10 U/L (ref 0–35)
AST: 15 U/L (ref 0–37)
Albumin: 4.4 g/dL (ref 3.5–5.2)
Alkaline Phosphatase: 63 U/L (ref 39–117)
Bilirubin, Direct: 0.1 mg/dL (ref 0.0–0.3)
Total Bilirubin: 0.8 mg/dL (ref 0.2–1.2)
Total Protein: 7.3 g/dL (ref 6.0–8.3)

## 2018-08-03 LAB — BASIC METABOLIC PANEL
BUN: 16 mg/dL (ref 6–23)
CO2: 29 mEq/L (ref 19–32)
Calcium: 9.7 mg/dL (ref 8.4–10.5)
Chloride: 103 mEq/L (ref 96–112)
Creatinine, Ser: 0.88 mg/dL (ref 0.40–1.20)
GFR: 69.56 mL/min (ref >60.00)
Glucose, Bld: 100 mg/dL — ABNORMAL HIGH (ref 70–99)
Potassium: 4.4 mEq/L (ref 3.5–5.1)
Sodium: 139 mEq/L (ref 135–145)

## 2018-08-03 LAB — LIPID PANEL
Cholesterol: 265 mg/dL — ABNORMAL HIGH (ref 0–200)
HDL: 58.9 mg/dL (ref >39.00)
LDL Cholesterol: 189 mg/dL — ABNORMAL HIGH (ref 0–99)
NonHDL: 205.99
Total CHOL/HDL Ratio: 4
Triglycerides: 83 mg/dL (ref 0.0–149.0)
VLDL: 16.6 mg/dL (ref 0.0–40.0)

## 2018-08-03 LAB — TSH: TSH: 0.85 u[IU]/mL (ref 0.35–4.50)

## 2018-08-03 NOTE — Telephone Encounter (Signed)
Per office policy sent 30 day to local pharmacy until appt.../lmb  

## 2018-08-04 LAB — HM MAMMOGRAPHY

## 2018-08-11 ENCOUNTER — Ambulatory Visit (INDEPENDENT_AMBULATORY_CARE_PROVIDER_SITE_OTHER): Payer: Self-pay | Admitting: Internal Medicine

## 2018-08-11 ENCOUNTER — Encounter: Payer: Self-pay | Admitting: Internal Medicine

## 2018-08-11 DIAGNOSIS — Z0000 Encounter for general adult medical examination without abnormal findings: Secondary | ICD-10-CM

## 2018-08-11 NOTE — Progress Notes (Signed)
Subjective:  Patient ID: Amanda Pena, female    DOB: April 04, 1956  Age: 63 y.o. MRN: [id number]  CC: No chief complaint on file.   HPI Amanda Pena presents for a well exam C/o elevated lipids  Outpatient Medications Prior to Visit  Medication Sig Dispense Refill  . Ascorbic Acid (VITAMIN C) 1000 MG tablet Take 1,000 mg by mouth daily.    . Cholecalciferol 2000 UNITS CAPS Take 1-2 each by mouth daily.     . Omega-3 Fatty Acids (FISH OIL) 1000 MG CAPS Take 2 capsules by mouth daily.    Marland Kitchen SYNTHROID 75 MCG tablet Take 1 tablet (75 mcg total) by mouth daily before breakfast. Keep scheduled appt for future refills 30 tablet 0  . zolpidem (AMBIEN) 10 MG tablet TAKE 1 TABLET BY MOUTH AT BEDTIME AS NEEDED FOR SLEEP 30 tablet 3  . amoxicillin-clavulanate (AUGMENTIN) 875-125 MG tablet Take 1 tablet by mouth 2 (two) times daily. 20 tablet 0  . cetirizine (ZYRTEC) 10 MG tablet TAKE 1 TABLET (10 MG TOTAL) BY MOUTH DAILY. 30 tablet 1  . SYNTHROID 75 MCG tablet TAKE ONE TABLET BY MOUTH ONCE DAILY 90 tablet 3   No facility-administered medications prior to visit.     ROS: Review of Systems  Constitutional: Negative for activity change, appetite change, chills, fatigue and unexpected weight change.  HENT: Negative for congestion, mouth sores and sinus pressure.   Eyes: Negative for visual disturbance.  Respiratory: Negative for cough and chest tightness.   Gastrointestinal: Negative for abdominal pain and nausea.  Genitourinary: Negative for difficulty urinating, frequency and vaginal pain.  Musculoskeletal: Positive for arthralgias. Negative for back pain and gait problem.  Skin: Negative for pallor and rash.  Neurological: Negative for dizziness, tremors, weakness, numbness and headaches.  Psychiatric/Behavioral: Negative for confusion, sleep disturbance and suicidal ideas. The patient is not nervous/anxious.     Objective:  BP 118/72 (BP Location: Left Arm, Patient Position:  Sitting, Cuff Size: Normal)   Pulse 95   Temp 98.5 F (36.9 C) (Oral)   Ht 5\' 5"  (1.651 m)   Wt 134 lb (60.8 kg)   SpO2 97%   BMI 22.30 kg/m   BP Readings from Last 3 Encounters:  08/11/18 118/72  09/14/16 140/90  06/25/16 120/84    Wt Readings from Last 3 Encounters:  08/11/18 134 lb (60.8 kg)  09/14/16 133 lb (60.3 kg)  06/25/16 134 lb (60.8 kg)    Physical Exam Constitutional:      General: She is not in acute distress.    Appearance: She is well-developed.  HENT:     Head: Normocephalic.     Right Ear: External ear normal.     Left Ear: External ear normal.     Nose: Nose normal.  Eyes:     General:        Right eye: No discharge.        Left eye: No discharge.     Conjunctiva/sclera: Conjunctivae normal.     Pupils: Pupils are equal, round, and reactive to light.  Neck:     Musculoskeletal: Normal range of motion and neck supple.     Thyroid: No thyromegaly.     Vascular: No JVD.     Trachea: No tracheal deviation.  Cardiovascular:     Rate and Rhythm: Normal rate and regular rhythm.     Heart sounds: Normal heart sounds.  Pulmonary:     Effort: No respiratory distress.     Breath  sounds: No stridor. No wheezing.  Abdominal:     General: Bowel sounds are normal. There is no distension.     Palpations: Abdomen is soft. There is no mass.     Tenderness: There is no abdominal tenderness. There is no guarding or rebound.  Musculoskeletal:        General: No tenderness.  Lymphadenopathy:     Cervical: No cervical adenopathy.  Skin:    Findings: No erythema or rash.  Neurological:     Cranial Nerves: No cranial nerve deficit.     Motor: No abnormal muscle tone.     Coordination: Coordination normal.     Deep Tendon Reflexes: Reflexes normal.  Psychiatric:        Behavior: Behavior normal.        Thought Content: Thought content normal.        Judgment: Judgment normal.    Hands w/OA  Lab Results  Component Value Date   WBC 4.7 08/03/2018   HGB  14.5 08/03/2018   HCT 42.1 08/03/2018   PLT 182.0 08/03/2018   GLUCOSE 100 (H) 08/03/2018   CHOL 265 (H) 08/03/2018   TRIG 83.0 08/03/2018   HDL 58.90 08/03/2018   LDLDIRECT 186.2 04/16/2008   LDLCALC 189 (H) 08/03/2018   ALT 10 08/03/2018   AST 15 08/03/2018   NA 139 08/03/2018   K 4.4 08/03/2018   CL 103 08/03/2018   CREATININE 0.88 08/03/2018   BUN 16 08/03/2018   CO2 29 08/03/2018   TSH 0.85 08/03/2018    Dg Chest 2 View  Result Date: 09/14/2016 CLINICAL DATA:  Cough, congestion for 2 weeks EXAM: CHEST  2 VIEW COMPARISON:  10/15/2014 FINDINGS: Cardiomediastinal silhouette is stable. Mild lower thoracic dextroscoliosis. No infiltrate or pleural effusion. No pulmonary edema. IMPRESSION: No active cardiopulmonary disease. Electronically Signed   By: Lahoma Crocker M.D.   On: 09/14/2016 12:36    Assessment & Plan:   There are no diagnoses linked to this encounter.   No orders of the defined types were placed in this encounter.    Follow-up: No follow-ups on file.  Walker Kehr, MD

## 2018-08-11 NOTE — Patient Instructions (Signed)

## 2018-08-14 NOTE — Assessment & Plan Note (Addendum)
We discussed age appropriate health related issues, including available/recomended screening tests and vaccinations. We discussed a need for adhering to healthy diet and exercise. Labs were ordered to be later reviewed . All questions were answered. A cardiac CT scan for coronary calcium was offered  Cologuard

## 2018-08-25 ENCOUNTER — Encounter: Payer: Self-pay | Admitting: Gastroenterology

## 2018-08-26 ENCOUNTER — Telehealth: Payer: Self-pay

## 2018-08-26 DIAGNOSIS — E785 Hyperlipidemia, unspecified: Secondary | ICD-10-CM

## 2018-08-26 NOTE — Telephone Encounter (Signed)
Test ordered, LM notifying pt  Copied from [place]. Topic: General - Inquiry >> Aug 24, 2018  9:45 AM Ahmed Prima L wrote: Reason for UZH:[id number] states she was suppose to hear from someone regarding a cardiac CT screening and colo-guard. Please Advise.

## 2018-08-31 ENCOUNTER — Encounter: Payer: Self-pay | Admitting: Internal Medicine

## 2018-08-31 ENCOUNTER — Other Ambulatory Visit: Payer: Self-pay | Admitting: Internal Medicine

## 2018-10-12 ENCOUNTER — Inpatient Hospital Stay: Admission: RE | Admit: 2018-10-12 | Payer: Self-pay | Source: Ambulatory Visit

## 2019-02-18 ENCOUNTER — Other Ambulatory Visit: Payer: Self-pay | Admitting: Internal Medicine

## 2019-03-06 ENCOUNTER — Other Ambulatory Visit: Payer: Self-pay | Admitting: Internal Medicine

## 2019-05-08 ENCOUNTER — Other Ambulatory Visit: Payer: Self-pay

## 2019-05-08 ENCOUNTER — Ambulatory Visit (INDEPENDENT_AMBULATORY_CARE_PROVIDER_SITE_OTHER)
Admission: RE | Admit: 2019-05-08 | Discharge: 2019-05-08 | Disposition: A | Discharge status: Home / Self Care | Payer: Self-pay | Source: Ambulatory Visit | Attending: Internal Medicine | Admitting: Internal Medicine

## 2019-05-08 DIAGNOSIS — E785 Hyperlipidemia, unspecified: Secondary | ICD-10-CM

## 2019-05-17 ENCOUNTER — Other Ambulatory Visit: Payer: Self-pay | Admitting: Internal Medicine

## 2019-05-17 ENCOUNTER — Telehealth: Payer: Self-pay

## 2019-05-17 ENCOUNTER — Telehealth: Payer: Self-pay | Admitting: Internal Medicine

## 2019-05-17 DIAGNOSIS — R918 Other nonspecific abnormal finding of lung field: Secondary | ICD-10-CM

## 2019-05-17 MED ORDER — ATORVASTATIN CALCIUM 10 MG PO TABS: 10.0000 mg | ORAL_TABLET | Freq: Every day | ORAL | 3 refills | Status: DC

## 2019-05-17 MED ORDER — ROSUVASTATIN CALCIUM 5 MG PO TABS: 5.0000 mg | ORAL_TABLET | Freq: Every day | ORAL | 3 refills | Status: DC

## 2019-05-17 NOTE — Telephone Encounter (Signed)
Ok Thx 

## 2019-05-17 NOTE — Telephone Encounter (Signed)
-----   Message from Cassandria Anger, MD sent at 05/17/2019 12:17 AM EDT ----- Inez Catalina, Please schedule an appointment with me in 4 months (beginning of March) with lipids and liver tests prior. Thanks, AP

## 2019-05-17 NOTE — Telephone Encounter (Signed)
Ryan, from the pharmacy, called stating the pt does not have insurance. He is requesting to have Lipitor sent in as an alternative as it would be less expensive. Please advise. [phone]

## 2019-05-17 NOTE — Telephone Encounter (Signed)
LMTCB

## 2019-05-17 NOTE — Telephone Encounter (Signed)
Please advise 

## 2019-06-13 ENCOUNTER — Ambulatory Visit (INDEPENDENT_AMBULATORY_CARE_PROVIDER_SITE_OTHER): Payer: Self-pay | Admitting: Internal Medicine

## 2019-06-13 ENCOUNTER — Other Ambulatory Visit: Payer: Self-pay

## 2019-06-13 ENCOUNTER — Other Ambulatory Visit (INDEPENDENT_AMBULATORY_CARE_PROVIDER_SITE_OTHER): Payer: Self-pay

## 2019-06-13 ENCOUNTER — Encounter: Payer: Self-pay | Admitting: Internal Medicine

## 2019-06-13 DIAGNOSIS — R140 Abdominal distension (gaseous): Secondary | ICD-10-CM

## 2019-06-13 DIAGNOSIS — R109 Unspecified abdominal pain: Secondary | ICD-10-CM | POA: Insufficient documentation

## 2019-06-13 DIAGNOSIS — R1032 Left lower quadrant pain: Secondary | ICD-10-CM

## 2019-06-13 DIAGNOSIS — R1084 Generalized abdominal pain: Secondary | ICD-10-CM

## 2019-06-13 LAB — URINALYSIS
Bilirubin Urine: NEGATIVE
Hgb urine dipstick: NEGATIVE
Leukocytes,Ua: NEGATIVE
Nitrite: NEGATIVE
Specific Gravity, Urine: >=1.03 — AB (ref 1.000–1.030)
Total Protein, Urine: NEGATIVE
Urine Glucose: NEGATIVE
Urobilinogen, UA: 1 (ref 0.0–1.0)
pH: 5.5 (ref 5.0–8.0)

## 2019-06-13 LAB — CBC WITH DIFFERENTIAL/PLATELET
Basophils Absolute: 0.1 10*3/uL (ref 0.0–0.1)
Basophils Relative: 1.2 % (ref 0.0–3.0)
Eosinophils Absolute: 0.1 10*3/uL (ref 0.0–0.7)
Eosinophils Relative: 1.1 % (ref 0.0–5.0)
HCT: 38.3 % (ref 36.0–46.0)
Hemoglobin: 12.3 g/dL (ref 12.0–15.0)
Lymphocytes Relative: 16.2 % (ref 12.0–46.0)
Lymphs Abs: 1.6 10*3/uL (ref 0.7–4.0)
MCHC: 32.1 g/dL (ref 30.0–36.0)
MCV: 88.7 fl (ref 78.0–100.0)
Monocytes Absolute: 0.7 10*3/uL (ref 0.1–1.0)
Monocytes Relative: 7.1 % (ref 3.0–12.0)
Neutro Abs: 7.2 10*3/uL (ref 1.4–7.7)
Neutrophils Relative %: 74.4 % (ref 43.0–77.0)
Platelets: 273 10*3/uL (ref 150.0–400.0)
RBC: 4.32 Mil/uL (ref 3.87–5.11)
RDW: 13 % (ref 11.5–15.5)
WBC: 9.7 10*3/uL (ref 4.0–10.5)

## 2019-06-13 LAB — BASIC METABOLIC PANEL
BUN: 20 mg/dL (ref 6–23)
CO2: 30 mEq/L (ref 19–32)
Calcium: 9.1 mg/dL (ref 8.4–10.5)
Chloride: 102 mEq/L (ref 96–112)
Creatinine, Ser: 0.89 mg/dL (ref 0.40–1.20)
GFR: 64.42 mL/min (ref >60.00)
Glucose, Bld: 117 mg/dL — ABNORMAL HIGH (ref 70–99)
Potassium: 3.9 mEq/L (ref 3.5–5.1)
Sodium: 139 mEq/L (ref 135–145)

## 2019-06-13 LAB — HEPATIC FUNCTION PANEL
ALT: 22 U/L (ref 0–35)
AST: 31 U/L (ref 0–37)
Albumin: 3.7 g/dL (ref 3.5–5.2)
Alkaline Phosphatase: 120 U/L — ABNORMAL HIGH (ref 39–117)
Bilirubin, Direct: 0 mg/dL (ref 0.0–0.3)
Total Bilirubin: 0.4 mg/dL (ref 0.2–1.2)
Total Protein: 6.9 g/dL (ref 6.0–8.3)

## 2019-06-13 MED ORDER — AMOXICILLIN-POT CLAVULANATE 875-125 MG PO TABS: 1.0000 | ORAL_TABLET | Freq: Two times a day (BID) | ORAL | 0 refills | Status: DC

## 2019-06-13 MED ORDER — METRONIDAZOLE 500 MG PO TABS: 500.0000 mg | ORAL_TABLET | Freq: Three times a day (TID) | ORAL | 0 refills | Status: DC

## 2019-06-13 NOTE — Assessment & Plan Note (Addendum)
LLQ - ?diverticulitis Augmentin Flagyl Labs CT abd

## 2019-06-13 NOTE — Assessment & Plan Note (Addendum)
CT abd Labs PO abx To ER if worse Diverticulitis vs other

## 2019-06-13 NOTE — Patient Instructions (Signed)
Go to ER if worse 

## 2019-06-13 NOTE — Progress Notes (Signed)
Subjective:  Patient ID: Amanda Pena, female    DOB: 12-11-1955  Age: 63 y.o. MRN: [id number]  CC: No chief complaint on file.   HPI Amanda Pena presents for abd pain and bloating x 3 weeks No pattern No fever, no n/v C/o RUQ pain  Outpatient Medications Prior to Visit  Medication Sig Dispense Refill  . Ascorbic Acid (VITAMIN C) 1000 MG tablet Take 1,000 mg by mouth daily.    . Cholecalciferol 2000 UNITS CAPS Take 1-2 each by mouth daily.     . Omega-3 Fatty Acids (FISH OIL) 1000 MG CAPS Take 2 capsules by mouth daily.    Marland Kitchen SYNTHROID 75 MCG tablet TAKE 1 TABLET (75 MCG TOTAL) BY MOUTH DAILY BEFORE BREAKFAST. 90 tablet 1  . zolpidem (AMBIEN) 10 MG tablet TAKE 1 TABLET BY MOUTH EVERY DAY AT BEDTIME AS NEEDED FOR SLEEP 30 tablet 3  . atorvastatin (LIPITOR) 10 MG tablet Take 1 tablet (10 mg total) by mouth daily at 6 PM. Annual appt due in Jan must see provider for future refills 90 tablet 3   No facility-administered medications prior to visit.     ROS: Review of Systems  Constitutional: Negative for activity change, appetite change, chills, fatigue and unexpected weight change.  HENT: Negative for congestion, mouth sores and sinus pressure.   Eyes: Negative for visual disturbance.  Respiratory: Negative for cough and chest tightness.   Gastrointestinal: Positive for abdominal distention and anal bleeding. Negative for abdominal pain, blood in stool, constipation, nausea, rectal pain and vomiting.  Genitourinary: Negative for difficulty urinating, frequency and vaginal pain.  Musculoskeletal: Negative for back pain and gait problem.  Skin: Negative for pallor and rash.  Neurological: Negative for dizziness, tremors, weakness, numbness and headaches.  Psychiatric/Behavioral: Negative for confusion and sleep disturbance.    Objective:  BP 122/74 (BP Location: Left Arm, Patient Position: Sitting, Cuff Size: Normal)   Pulse 76   Temp 98.4 F (36.9 C) (Oral)   Ht 5'  5" (1.651 m)   Wt 130 lb (59 kg)   SpO2 98%   BMI 21.63 kg/m   BP Readings from Last 3 Encounters:  06/13/19 122/74  08/11/18 118/72  09/14/16 140/90    Wt Readings from Last 3 Encounters:  06/13/19 130 lb (59 kg)  08/11/18 134 lb (60.8 kg)  09/14/16 133 lb (60.3 kg)    Physical Exam Constitutional:      General: She is not in acute distress.    Appearance: Normal appearance. She is well-developed.  HENT:     Head: Normocephalic.     Right Ear: External ear normal.     Left Ear: External ear normal.     Nose: Nose normal.  Eyes:     General:        Right eye: No discharge.        Left eye: No discharge.     Conjunctiva/sclera: Conjunctivae normal.     Pupils: Pupils are equal, round, and reactive to light.  Neck:     Musculoskeletal: Normal range of motion and neck supple.     Thyroid: No thyromegaly.     Vascular: No JVD.     Trachea: No tracheal deviation.  Cardiovascular:     Rate and Rhythm: Normal rate and regular rhythm.     Heart sounds: Normal heart sounds.  Pulmonary:     Effort: No respiratory distress.     Breath sounds: No stridor. No wheezing.  Abdominal:  General: Bowel sounds are normal. There is distension.     Palpations: Abdomen is soft. There is no mass.     Tenderness: There is abdominal tenderness. There is no guarding or rebound.     Hernia: No hernia is present.  Musculoskeletal:        General: No tenderness.  Lymphadenopathy:     Cervical: No cervical adenopathy.  Skin:    Findings: No erythema or rash.  Neurological:     Cranial Nerves: No cranial nerve deficit.     Motor: No abnormal muscle tone.     Coordination: Coordination normal.     Deep Tendon Reflexes: Reflexes normal.  Psychiatric:        Behavior: Behavior normal.        Thought Content: Thought content normal.        Judgment: Judgment normal.     No mass LLQ w/pain   Lab Results  Component Value Date   WBC 4.7 08/03/2018   HGB 14.5 08/03/2018   HCT  42.1 08/03/2018   PLT 182.0 08/03/2018   GLUCOSE 100 (H) 08/03/2018   CHOL 265 (H) 08/03/2018   TRIG 83.0 08/03/2018   HDL 58.90 08/03/2018   LDLDIRECT 186.2 04/16/2008   LDLCALC 189 (H) 08/03/2018   ALT 10 08/03/2018   AST 15 08/03/2018   NA 139 08/03/2018   K 4.4 08/03/2018   CL 103 08/03/2018   CREATININE 0.88 08/03/2018   BUN 16 08/03/2018   CO2 29 08/03/2018   TSH 0.85 08/03/2018    Ct Cardiac Scoring  Addendum Date: 05/08/2019   ADDENDUM REPORT: 05/08/2019 12:06 CLINICAL DATA:  Risk stratification EXAM: Coronary Calcium Score TECHNIQUE: The patient was scanned on a [employer] scanner. Axial non-contrast 3 mm slices were carried out through the heart. The data set was analyzed on a dedicated work station and scored using the [place]. FINDINGS: Non-cardiac: See separate report from [facility]. Ascending Aorta: Normal size, no calcifications. Pericardium: Normal. Coronary arteries: Normal origin. IMPRESSION: Coronary calcium score of 97. This was 72 percentile for age and sex matched control. Electronically Signed   By: Ena Dawley   On: 05/08/2019 12:06   Result Date: 05/08/2019 EXAM: OVER-READ INTERPRETATION  CT CHEST The following report is an over-read performed by radiologist Dr. Rolm Baptise of [facility], [place] on 05/08/2019. This over-read does not include interpretation of cardiac or coronary anatomy or pathology. The coronary calcium score interpretation by the cardiologist is attached. COMPARISON:  None. FINDINGS: Vascular: Heart is normal size.  Visualized aorta normal caliber. Mediastinum/Nodes: No adenopathy in the lower mediastinum or hila. Lungs/Pleura: Clustered nodular densities in the right lower lobe, both ground-glass and mixed sub solid and solid. Index nodule in the right lower lobe measures 10 mm on image 25. Visualized lungs otherwise clear. No effusions. Upper Abdomen: Imaging into the upper abdomen shows no acute findings.  Musculoskeletal: Chest wall soft tissues are unremarkable. No acute bony abnormality. IMPRESSION: Clustered mixed solid and ground-glass nodules in the posterior right lower lobe. The clustered nature suggests an infectious or inflammatory process. Recommend follow-up CT in 3-6 months to ensure resolution. Electronically Signed: By: Rolm Baptise M.D. On: 05/08/2019 10:48    Assessment & Plan:   There are no diagnoses linked to this encounter.   No orders of the defined types were placed in this encounter.    Follow-up: No follow-ups on file.  Walker Kehr, MD

## 2019-06-14 ENCOUNTER — Inpatient Hospital Stay: Admission: RE | Admit: 2019-06-14 | Payer: Self-pay | Source: Ambulatory Visit

## 2019-06-14 ENCOUNTER — Ambulatory Visit
Admission: RE | Admit: 2019-06-14 | Discharge: 2019-06-14 | Disposition: A | Discharge status: Home / Self Care | Payer: Self-pay | Source: Ambulatory Visit | Attending: Internal Medicine | Admitting: Internal Medicine

## 2019-06-14 ENCOUNTER — Other Ambulatory Visit: Payer: Self-pay

## 2019-06-14 ENCOUNTER — Other Ambulatory Visit: Payer: Self-pay | Admitting: Internal Medicine

## 2019-06-14 DIAGNOSIS — R1084 Generalized abdominal pain: Secondary | ICD-10-CM | POA: Insufficient documentation

## 2019-06-14 DIAGNOSIS — R1032 Left lower quadrant pain: Secondary | ICD-10-CM | POA: Insufficient documentation

## 2019-06-14 DIAGNOSIS — R140 Abdominal distension (gaseous): Secondary | ICD-10-CM | POA: Insufficient documentation

## 2019-06-14 HISTORY — DX: Malignant (primary) neoplasm, unspecified: C80.1

## 2019-06-14 MED ORDER — IOHEXOL 300 MG/ML  SOLN
75.0000 mL | Freq: Once | INTRAMUSCULAR | Status: AC | PRN
  Administered 2019-06-14: 75 mL via INTRAVENOUS

## 2019-06-14 MED ORDER — HYDROCODONE-ACETAMINOPHEN 5-325 MG PO TABS: 1.0000 | ORAL_TABLET | Freq: Four times a day (QID) | ORAL | 0 refills | Status: DC | PRN

## 2019-06-14 MED ORDER — ONDANSETRON HCL 4 MG PO TABS: 4.0000 mg | ORAL_TABLET | Freq: Three times a day (TID) | ORAL | 1 refills | Status: DC | PRN

## 2019-06-14 MED ORDER — ALPRAZOLAM 0.5 MG PO TBDP: 0.5000 mg | ORAL_TABLET | Freq: Three times a day (TID) | ORAL | 1 refills | Status: DC | PRN

## 2019-06-14 NOTE — Telephone Encounter (Signed)
I called Amanda Pena with CT scan results - probable ovarian cancer.  She is not doing too well today.  We will stop the antibiotics.  I emailed a prescription for Zofran, Norco, alprazolam to use as needed.  Lovey Newcomer will go to emergency room if feeling worse.

## 2019-06-19 ENCOUNTER — Telehealth: Payer: Self-pay

## 2019-06-19 ENCOUNTER — Other Ambulatory Visit: Payer: Self-pay | Admitting: Internal Medicine

## 2019-06-19 ENCOUNTER — Telehealth: Payer: Self-pay | Admitting: Oncology

## 2019-06-19 DIAGNOSIS — C569 Malignant neoplasm of unspecified ovary: Secondary | ICD-10-CM

## 2019-06-19 NOTE — Telephone Encounter (Signed)
Call placed to Ms. Amanda Pena. Gyn Onc can see this Wednesday 06/21/19 at 0830. Dr. Fransisca Connors will be here this week.  Educated on what to expect at this visit. All questions answered. Encouraged to call with any further questions or needs.

## 2019-06-19 NOTE — Telephone Encounter (Signed)
Called Amanda Pena and asked if she would like to see the [employer] at [place] or at [place].  She said her family wants her to see the Gyn Oncologists at [facility].  Advised her that Dr. Fransisca Connors and Dr. Theora Gianotti rotate seeing patients at [facility].  Advised her that I will find out when she can be scheduled and call her back.

## 2019-06-20 ENCOUNTER — Other Ambulatory Visit: Payer: Self-pay

## 2019-06-20 NOTE — Progress Notes (Signed)
Patient pre screened for office appointment, no questions or concerns today. Patient reminded of upcoming appointment time and date. 

## 2019-06-21 ENCOUNTER — Other Ambulatory Visit: Payer: Self-pay

## 2019-06-21 ENCOUNTER — Inpatient Hospital Stay: Payer: Self-pay

## 2019-06-21 ENCOUNTER — Inpatient Hospital Stay: Payer: Self-pay | Attending: Obstetrics and Gynecology | Admitting: Obstetrics and Gynecology

## 2019-06-21 ENCOUNTER — Encounter: Payer: Self-pay | Admitting: Obstetrics and Gynecology

## 2019-06-21 VITALS — BP 128/80 | HR 103 | Temp 98.1°F | Resp 16 | Wt 129.4 lb

## 2019-06-21 DIAGNOSIS — R110 Nausea: Secondary | ICD-10-CM | POA: Insufficient documentation

## 2019-06-21 DIAGNOSIS — C562 Malignant neoplasm of left ovary: Secondary | ICD-10-CM | POA: Insufficient documentation

## 2019-06-21 DIAGNOSIS — R61 Generalized hyperhidrosis: Secondary | ICD-10-CM | POA: Insufficient documentation

## 2019-06-21 DIAGNOSIS — N9489 Other specified conditions associated with female genital organs and menstrual cycle: Secondary | ICD-10-CM

## 2019-06-21 DIAGNOSIS — M549 Dorsalgia, unspecified: Secondary | ICD-10-CM | POA: Insufficient documentation

## 2019-06-21 DIAGNOSIS — K5900 Constipation, unspecified: Secondary | ICD-10-CM | POA: Insufficient documentation

## 2019-06-21 DIAGNOSIS — R140 Abdominal distension (gaseous): Secondary | ICD-10-CM | POA: Insufficient documentation

## 2019-06-21 DIAGNOSIS — E785 Hyperlipidemia, unspecified: Secondary | ICD-10-CM | POA: Insufficient documentation

## 2019-06-21 DIAGNOSIS — R188 Other ascites: Secondary | ICD-10-CM | POA: Insufficient documentation

## 2019-06-21 DIAGNOSIS — R3915 Urgency of urination: Secondary | ICD-10-CM | POA: Insufficient documentation

## 2019-06-21 DIAGNOSIS — C786 Secondary malignant neoplasm of retroperitoneum and peritoneum: Secondary | ICD-10-CM

## 2019-06-21 DIAGNOSIS — Z79899 Other long term (current) drug therapy: Secondary | ICD-10-CM | POA: Insufficient documentation

## 2019-06-21 DIAGNOSIS — Z85850 Personal history of malignant neoplasm of thyroid: Secondary | ICD-10-CM | POA: Insufficient documentation

## 2019-06-21 DIAGNOSIS — Z5111 Encounter for antineoplastic chemotherapy: Secondary | ICD-10-CM | POA: Insufficient documentation

## 2019-06-21 DIAGNOSIS — G4700 Insomnia, unspecified: Secondary | ICD-10-CM | POA: Insufficient documentation

## 2019-06-21 DIAGNOSIS — C561 Malignant neoplasm of right ovary: Secondary | ICD-10-CM | POA: Insufficient documentation

## 2019-06-21 DIAGNOSIS — J90 Pleural effusion, not elsewhere classified: Secondary | ICD-10-CM | POA: Insufficient documentation

## 2019-06-21 DIAGNOSIS — R918 Other nonspecific abnormal finding of lung field: Secondary | ICD-10-CM

## 2019-06-21 DIAGNOSIS — T451X5A Adverse effect of antineoplastic and immunosuppressive drugs, initial encounter: Secondary | ICD-10-CM | POA: Insufficient documentation

## 2019-06-21 DIAGNOSIS — R1032 Left lower quadrant pain: Secondary | ICD-10-CM | POA: Insufficient documentation

## 2019-06-21 DIAGNOSIS — K521 Toxic gastroenteritis and colitis: Secondary | ICD-10-CM | POA: Insufficient documentation

## 2019-06-21 DIAGNOSIS — E039 Hypothyroidism, unspecified: Secondary | ICD-10-CM | POA: Insufficient documentation

## 2019-06-21 DIAGNOSIS — R590 Localized enlarged lymph nodes: Secondary | ICD-10-CM | POA: Insufficient documentation

## 2019-06-21 DIAGNOSIS — R5381 Other malaise: Secondary | ICD-10-CM | POA: Insufficient documentation

## 2019-06-21 DIAGNOSIS — R634 Abnormal weight loss: Secondary | ICD-10-CM | POA: Insufficient documentation

## 2019-06-21 DIAGNOSIS — G8929 Other chronic pain: Secondary | ICD-10-CM | POA: Insufficient documentation

## 2019-06-21 DIAGNOSIS — D398 Neoplasm of uncertain behavior of other specified female genital organs: Secondary | ICD-10-CM | POA: Insufficient documentation

## 2019-06-21 DIAGNOSIS — R911 Solitary pulmonary nodule: Secondary | ICD-10-CM

## 2019-06-21 DIAGNOSIS — C801 Malignant (primary) neoplasm, unspecified: Secondary | ICD-10-CM

## 2019-06-21 LAB — COMPREHENSIVE METABOLIC PANEL
ALT: 20 U/L (ref 0–44)
AST: 28 U/L (ref 15–41)
Albumin: 3.4 g/dL — ABNORMAL LOW (ref 3.5–5.0)
Alkaline Phosphatase: 133 U/L — ABNORMAL HIGH (ref 38–126)
Anion gap: 7 (ref 5–15)
BUN: 15 mg/dL (ref 8–23)
CO2: 27 mmol/L (ref 22–32)
Calcium: 8.4 mg/dL — ABNORMAL LOW (ref 8.9–10.3)
Chloride: 102 mmol/L (ref 98–111)
Creatinine, Ser: 0.67 mg/dL (ref 0.44–1.00)
GFR calc Af Amer: >60 mL/min (ref >60)
GFR calc non Af Amer: >60 mL/min (ref >60)
Glucose, Bld: 97 mg/dL (ref 70–99)
Potassium: 4.3 mmol/L (ref 3.5–5.1)
Sodium: 136 mmol/L (ref 135–145)
Total Bilirubin: 0.5 mg/dL (ref 0.3–1.2)
Total Protein: 7.5 g/dL (ref 6.5–8.1)

## 2019-06-21 LAB — CBC WITH DIFFERENTIAL/PLATELET
Abs Immature Granulocytes: 0.03 10*3/uL (ref 0.00–0.07)
Basophils Absolute: 0 10*3/uL (ref 0.0–0.1)
Basophils Relative: 0 %
Eosinophils Absolute: 0.1 10*3/uL (ref 0.0–0.5)
Eosinophils Relative: 1 %
HCT: 41.8 % (ref 36.0–46.0)
Hemoglobin: 12.8 g/dL (ref 12.0–15.0)
Immature Granulocytes: 0 %
Lymphocytes Relative: 12 %
Lymphs Abs: 1 10*3/uL (ref 0.7–4.0)
MCH: 27.9 pg (ref 26.0–34.0)
MCHC: 30.6 g/dL (ref 30.0–36.0)
MCV: 91.3 fL (ref 80.0–100.0)
Monocytes Absolute: 0.6 10*3/uL (ref 0.1–1.0)
Monocytes Relative: 7 %
Neutro Abs: 6.5 10*3/uL (ref 1.7–7.7)
Neutrophils Relative %: 80 %
Platelets: 355 10*3/uL (ref 150–400)
RBC: 4.58 MIL/uL (ref 3.87–5.11)
RDW: 12.2 % (ref 11.5–15.5)
WBC: 8.2 10*3/uL (ref 4.0–10.5)
nRBC: 0 % (ref 0.0–0.2)

## 2019-06-21 NOTE — Progress Notes (Signed)
Worsening of symptoms since las week.  Increased abdominal pressure since Thanksgiving and ibuprofen helps with pain.  Last 3-4 nights temp is low grade and wakes in the middle of the night and clothes are "soaked".

## 2019-06-21 NOTE — Progress Notes (Addendum)
Gynecologic Oncology Consult Visit   Referring Provider: Dr. Alain Marion  Chief Complaint: Bilateral adnexal masses and carcinomatosis concerning for ovarian cancer  Subjective:  Amanda Pena is a 63 y.o. P1 post menopausal female who is seen in consultation from Dr. Alain Marion for adnexal masses with carcinomatosis.   05/08/19 Coronary calcium score of 97. This was 91 percentile for age and sex matched controls.  Also noted multiple pulmonary nodules up to 10 mm felt to be indeterminate and plan was to repeat scan in 6 months.    In November she complained of severe lower quadrant pain with abdominal distention/bloating and saw her PCP who ordered CT scan 11/26, which showed heterogeneous soft tissue masses bilateral adnexa, measuring 4.3 x 3.6 cm on left, 4.6 x 2.7 cm on right. Diffuse omental soft tissue caking and diffuse peritoneal thickening are seen throughout the abdomen and pelvis with mild ascites. Findings consistent with peritoneal carcinomatosis.  Additionally, multiple pulmonary nodules in right lower lobe largest measuring 12 mm.   She continues to be very active and plays tennis.  Here with daughter today.    Menopause age 53.  No HRT. Last pap- 07/26/2017- NILM  Today, she complains of night sweats, intermittent constipation, and chronic back pain. Takes multiple supplements.   Problem List: Patient Active Problem List   Diagnosis Date Noted  . Abdominal pain 06/13/2019  . LLQ abdominal pain 06/13/2019  . Calcific bursitis of shoulder 01/29/2016  . Cough 10/15/2014  . Cervical pain (neck) 10/15/2014  . Situational anxiety 03/07/2014  . Well adult exam 02/10/2013  . 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 Preventative health care 11/09/2011  . INSOMNIA, CHRONIC 08/21/2010  . PERSONAL HISTORY MALIGNANT NEOPLASM THYROID 08/21/2010  . Hypothyroidism 04/09/2008  . HYPERLIPIDEMIA 04/09/2008  . Sinusitis 04/09/2008    Past Medical History: Past Medical  History:  Diagnosis Date  . Cancer ([place])   . HYPERLIPIDEMIA 04/09/2008   Qualifier: Diagnosis of  By: Wynona Luna   . HYPOTHYROIDISM 04/09/2008   Qualifier: Diagnosis of  By: Wynona Luna   . INSOMNIA, CHRONIC 08/21/2010   Qualifier: Diagnosis of  By: Wynona Luna   . PERSONAL HISTORY MALIGNANT NEOPLASM THYROID 08/21/2010   Qualifier: Diagnosis of  By: Wynona Luna     Past Surgical History: Past Surgical History:  Procedure Laterality Date  . THYROIDECTOMY, PARTIAL      Family History: Family History  Problem Relation Age of Onset  . Arthritis Mother   . Hyperlipidemia Mother   . Hyperlipidemia Father   . Diabetes Father   . Hypertension Father   . Heart disease Father 90       CAD    Social History: Social History   Socioeconomic History  . Marital status: Married    Spouse name: Not on file  . Number of children: Not on file  . Years of education: Not on file  . Highest education level: Not on file  Occupational History  . Not on file  Social Needs  . Financial resource strain: Not on file  . Food insecurity    Worry: Not on file    Inability: Not on file  . Transportation needs    Medical: Not on file    Non-medical: Not on file  Tobacco Use  . Smoking status: Never Smoker  . Smokeless tobacco: Never Used  Substance and Sexual Activity  . Alcohol use: Yes  . Drug use: No  .  Sexual activity: Yes  Lifestyle  . Physical activity    Days per week: Not on file    Minutes per session: Not on file  . Stress: Not on file  Relationships  . Social Herbalist on phone: Not on file    Gets together: Not on file    Attends religious service: Not on file    Active member of club or organization: Not on file    Attends meetings of clubs or organizations: Not on file    Relationship status: Not on file  . Intimate partner violence    Fear of current or ex partner: Not on file    Emotionally abused: Not on file    Physically abused:  Not on file    Forced sexual activity: Not on file  Other Topics Concern  . Not on file  Social History Narrative  . Not on file    Allergies: No Known Allergies  Current Medications: Current Outpatient Medications  Medication Sig Dispense Refill  . ALPRAZolam (NIRAVAM) 0.5 MG dissolvable tablet Take 1 tablet (0.5 mg total) by mouth 3 (three) times daily as needed for anxiety. 30 tablet 1  . Ascorbic Acid (VITAMIN C) 1000 MG tablet Take 1,000 mg by mouth daily.    . Omega-3 Fatty Acids (FISH OIL) 1000 MG CAPS Take 2 capsules by mouth daily.    Marland Kitchen SYNTHROID 75 MCG tablet TAKE 1 TABLET (75 MCG TOTAL) BY MOUTH DAILY BEFORE BREAKFAST. 90 tablet 1  . HYDROcodone-acetaminophen (NORCO/VICODIN) 5-325 MG tablet Take 1 tablet by mouth every 6 (six) hours as needed for severe pain. (Patient not taking: Reported on 06/20/2019) 20 tablet 0  . ondansetron (ZOFRAN) 4 MG tablet Take 1 tablet (4 mg total) by mouth every 8 (eight) hours as needed for nausea or vomiting. (Patient not taking: Reported on 06/20/2019) 30 tablet 1  . zolpidem (AMBIEN) 10 MG tablet TAKE 1 TABLET BY MOUTH EVERY DAY AT BEDTIME AS NEEDED FOR SLEEP (Patient not taking: Reported on 06/20/2019) 30 tablet 3   No current facility-administered medications for this visit.     Review of Systems General: negative for fevers, chills, fatigue, changes in sleep, changes in weight or appetite Skin: negative for changes in color, texture, moles or lesions Eyes: negative for changes in vision, pain, diplopia HEENT: negative for change in hearing, pain, discharge, tinnitus, vertigo, voice changes, sore throat, neck masses Breasts: negative for breast lumps Pulmonary: negative for dyspnea, orthopnea, productive cough Cardiac: negative for palpitations, syncope, pain, discomfort, pressure Gastrointestinal: She has some abdominal distention.  Negative for dysphagia, nausea, vomiting, jaundice, pain, constipation, diarrhea, hematemesis,  hematochezia Genitourinary/Sexual: negative for dysuria, discharge, hesitancy, nocturia, retention, stones, infections, STD's, incontinence Ob/Gyn: negative for irregular bleeding, pain Musculoskeletal: negative for pain, stiffness, swelling, range of motion limitation Hematology: negative for easy bruising, bleeding Neurologic/Psych: negative for headaches, seizures, paralysis, weakness, tremor, change in gait, change in sensation, mood swings, depression, anxiety, change in memory   Objective:  Physical Examination:  BP 128/80 (BP Location: Left Arm, Patient Position: Sitting)   Pulse (!) 103   Temp 98.1 F (36.7 C) (Tympanic)   Resp 16   Wt 129 lb 6.4 oz (58.7 kg)   SpO2 99%   BMI 21.53 kg/m     ECOG Performance Status: 1 - Symptomatic but completely ambulatory  GENERAL: Patient is a well appearing female in no acute distress HEENT:  PERRL, neck supple with midline trachea. Thyroid without masses.  NODES:  No  cervical, supraclavicular, axillary, or inguinal lymphadenopathy palpated.  LUNGS:  Clear to auscultation bilaterally.  No wheezes or rhonchi. HEART:  Regular rate and rhythm. No murmur appreciated. ABDOMEN:  Mild distention, nontender.  Positive, normoactive bowel sounds.  MSK:  No focal spinal tenderness to palpation. Full range of motion bilaterally in the upper extremities. EXTREMITIES:  No peripheral edema.   SKIN:  Clear with no obvious rashes or skin changes. No nail dyscrasia. NEURO:  Nonfocal. Well oriented.  Appropriate affect.  Pelvic: EGBUS: no lesions Cervix: no lesions, nontender, mobile Vagina: no lesions, no discharge or bleeding Uterus: normal size, nontender, mobile Adnexa: no palpable masses Rectovaginal: some subtle nodularity in the cul de sac.    Radiologic Imaging: 06/16/19 - CT showed: heterogeneous soft tissue masses bilateral adnexa, measuring 4.3 x 3.6 cm on left, 4.6 x 2.7 cm on right. Diffuse omental soft tissue caking and diffuse  peritoneal thickening are seen throughout the abdomen and pelvis with mild ascites. Findings consistent with peritoneal carcinomatosis. Additionally, multiple pulmonary nodules in right lower lobe largest measuring 12 mm.   CBC    Component Value Date/Time   WBC 8.2 06/21/2019 1024   RBC 4.58 06/21/2019 1024   HGB 12.8 06/21/2019 1024   HCT 41.8 06/21/2019 1024   PLT 355 06/21/2019 1024   MCV 91.3 06/21/2019 1024   MCH 27.9 06/21/2019 1024   MCHC 30.6 06/21/2019 1024   RDW 12.2 06/21/2019 1024   LYMPHSABS 1.0 06/21/2019 1024   MONOABS 0.6 06/21/2019 1024   EOSABS 0.1 06/21/2019 1024   BASOSABS 0.0 06/21/2019 1024   CMP Latest Ref Rng & Units 06/21/2019 06/13/2019 08/03/2018  Glucose 70 - 99 mg/dL 97 117(H) 100(H)  BUN 8 - 23 mg/dL _0 Creatinine 0.44 - 1.00 mg/dL 0.67 0.89 0.88  Sodium 135 - 145 mmol/L 136 139 139  Potassium 3.5 - 5.1 mmol/L 4.3 3.9 4.4  Chloride 98 - 111 mmol/L 102 102 103  CO2 22 - 32 mmol/L _1 Calcium 8.9 - 10.3 mg/dL 8.4(L) 9.1 9.7  Total Protein 6.5 - 8.1 g/dL 7.5 6.9 7.3  Total Bilirubin 0.3 - 1.2 mg/dL 0.5 0.4 0.8  Alkaline Phos 38 - 126 U/L 133(H) 120(H) 63  AST 15 - 41 U/L _2 ALT 0 - 44 U/L _3 Assessment:  Female Iafrate is a 63 y.o. 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 She has an excellent performance status.    Medical co-morbidities complicating care: not significant Plan:   Problem List Items Addressed This Visit    None    Visit Diagnoses    Adnexal mass    -  Primary   Relevant Orders   CA 125   CEA   Solitary pulmonary nodule       Relevant Orders   CT Chest W Contrast   Multiple pulmonary nodules         We discussed likely diagnosis of ovarian/fallopian tube cancer based on small solid bilateral adnexal masses and carcinomatosis.  She also has lung nodules concerning for metastatic disease.  In view of probable lung metastases and stage IV  disease, will order full chest CT scan for comparison to the scan from October to confirm growth.  We discussed primary debulking surgery versus neoadjuvant chemotherapy and the potential use of laparoscopy to determine which route to take.  However, if chest CT confirms growth of lung  lesions she could just have a CT directed biopsy to confirm Gyn origin of cancer instead of LS and start on chemotherapy since lung lesions would preclude complete debulking. We discussed that chemotherapy treatments with carboplatin and taxol are given every 3 weeks and CT scan is then repeated to assess for interval debulking surgery.  Three additional cycles of chemotherapy then given after surgery. We also discussed the need for genetic testing to determine if BRCA1/2 mutation or HRD present that would suggest major benefit for maintenance PARP inhibitor.    CA125, CEA CMP and CBC today.    The patient's diagnosis, an outline of the further diagnostic and laboratory studies which will be required, the recommendation for surgery, and alternatives were discussed with her and her accompanying family members.  All questions were answered to their satisfaction.  A total of 60 minutes were spent with the patient/family today; 50 % was spent in education, counseling and coordination of care for  Probable ovarian cancer.    Amanda Drown, MD  CC:  Plotnikov, [name], MD [address] [place],  [place] [address] [phone]

## 2019-06-22 ENCOUNTER — Other Ambulatory Visit: Payer: Self-pay

## 2019-06-22 ENCOUNTER — Other Ambulatory Visit: Payer: Self-pay | Admitting: Nurse Practitioner

## 2019-06-22 ENCOUNTER — Telehealth: Payer: Self-pay

## 2019-06-22 LAB — CA 125: Cancer Antigen (CA) 125: 628 U/mL — ABNORMAL HIGH (ref 0.0–38.1)

## 2019-06-22 LAB — CEA: CEA: 1.2 ng/mL (ref 0.0–4.7)

## 2019-06-22 MED ORDER — TRAMADOL HCL 50 MG PO TABS: 50.0000 mg | ORAL_TABLET | Freq: Four times a day (QID) | ORAL | 0 refills | Status: DC | PRN

## 2019-06-22 NOTE — Telephone Encounter (Signed)
Call returned to Ms. Amanda Pena. Notified that Ultram will be sent to her pharmacy shortly. Provided results of CA 125 and CEA. She states going forward she prefers not to know her marker level as she does not want to get fixed on the number. She sees Dr. Rogue Bussing tomorrow. We went over what she can expect at this appointment. Biopsy pending. Hope to have this scheduled today.

## 2019-06-23 ENCOUNTER — Other Ambulatory Visit: Payer: Self-pay

## 2019-06-23 ENCOUNTER — Encounter: Payer: Self-pay | Admitting: Internal Medicine

## 2019-06-23 ENCOUNTER — Telehealth: Payer: Self-pay

## 2019-06-23 ENCOUNTER — Inpatient Hospital Stay (HOSPITAL_BASED_OUTPATIENT_CLINIC_OR_DEPARTMENT_OTHER): Payer: Self-pay | Admitting: Internal Medicine

## 2019-06-23 DIAGNOSIS — C482 Malignant neoplasm of peritoneum, unspecified: Secondary | ICD-10-CM

## 2019-06-23 DIAGNOSIS — C569 Malignant neoplasm of unspecified ovary: Secondary | ICD-10-CM | POA: Insufficient documentation

## 2019-06-23 DIAGNOSIS — C561 Malignant neoplasm of right ovary: Secondary | ICD-10-CM

## 2019-06-23 DIAGNOSIS — C563 Malignant neoplasm of bilateral ovaries: Secondary | ICD-10-CM

## 2019-06-23 DIAGNOSIS — C562 Malignant neoplasm of left ovary: Secondary | ICD-10-CM

## 2019-06-23 MED ORDER — PROCHLORPERAZINE MALEATE 10 MG PO TABS: 10.0000 mg | ORAL_TABLET | Freq: Four times a day (QID) | ORAL | 1 refills | Status: DC | PRN

## 2019-06-23 MED ORDER — ONDANSETRON HCL 8 MG PO TABS: ORAL_TABLET | ORAL | 1 refills | Status: DC

## 2019-06-23 NOTE — Progress Notes (Signed)
START ON PATHWAY REGIMEN - Ovarian     A cycle is every 21 days:     Paclitaxel      Carboplatin   **Always confirm dose/schedule in your pharmacy ordering system**  Patient Characteristics: Preoperative or Nonsurgical Candidate (Clinical Staging), Newly Diagnosed, Neoadjuvant Therapy followed by Surgery Therapeutic Status: Preoperative or Nonsurgical Candidate (Clinical Staging) BRCA Mutation Status: Awaiting Test Results AJCC T Category: cT3c AJCC 8 Stage Grouping: IVB AJCC N Category: cNX AJCC M Category: pM1b Therapy Plan: Neoadjuvant Therapy followed by Surgery Intent of Therapy: Non-Curative / Palliative Intent, Discussed with Patient

## 2019-06-23 NOTE — Telephone Encounter (Signed)
Called to review upcoming biopsy appointment with Ms. Amanda Pena. CT is planning to perform her chest Ct while she is there for her biopsy.Her arrival time remains 1030. She verbalized understanding.

## 2019-06-23 NOTE — Progress Notes (Signed)
[facility] CONSULT NOTE  Patient Care Team: Plotnikov, [name], MD as PCP - General (Internal Medicine) Arvella Nigh, MD as Consulting Physician (Obstetrics and Gynecology) Clent Jacks, RN as Oncology Nurse Navigator  CHIEF COMPLAINTS/PURPOSE OF CONSULTATION: Peritoneal carcinomatosis  #  Oncology History Overview Note  #November 2020-omental caking/peritoneal carcinomatosis; bilateral adnexal masses 4-5 cm in size; multiple lung nodules  # Thyroid cancer at 26y s/p thyroidectomy [no RAIU]  # NGS/MOLECULAR TESTS:    # PALLIATIVE CARE EVALUATION:  # PAIN MANAGEMENT:    DIAGNOSIS:   STAGE:         ;  GOALS:  CURRENT/MOST RECENT THERAPY :     Peritoneal carcinoma ([place])  06/23/2019 Initial Diagnosis   Peritoneal carcinoma ([place])   06/30/2019 -  Chemotherapy   The patient had PALONOSETRON HCL INJECTION 0.25 MG/5ML, 0.25 mg, Intravenous,  Once, 0 of 6 cycles CARBOplatin (PARAPLATIN) in sodium chloride 0.9 % 100 mL chemo infusion, , Intravenous,  Once, 0 of 6 cycles FOSAPREPITANT IV INFUSION 150 MG, 150 mg, Intravenous,  Once, 0 of 6 cycles PACLitaxel (TAXOL) 288 mg in sodium chloride 0.9 % 250 mL chemo infusion (> 84m/m2), 175 mg/m2, Intravenous,  Once, 0 of 6 cycles  for chemotherapy treatment.       HISTORY OF PRESENTING ILLNESS:  SGhalia Reicks89y.o.  female with no significant past medical history except for history of thyroid cancer more than 30 years ago [place] for newly diagnosed imaging concerns for peritoneal carcinomatosis.  Patient was to have abdominal pain for the last 3 to 4 weeks progressive getting worse.  Also complains of abdominal distention bloating-back discomfort..  This led to further work-up with a CT scan that showed omental caking peritoneal carcinomatosis bilateral adnexal masses [summarized above].  Also noted to bilateral lung nodules on abdominal CT scan-up to  10 mm in size.  Patient interim was evaluated by gynecology oncology-recommended omental/peritoneal nodularity biopsy.  Also recommended a CT scan of the chest to document progression of lung nodules. [Interestingly the lung nodules were earlier noted CT of the lung ordered for cardiac work-up]  Otherwise patient is physically active.  Denies any tingling numbness.  Denies any significant weight loss.  Positive for drenching night sweats.  Positive for constipation.  Review of Systems  Constitutional: Positive for malaise/fatigue. Negative for chills, diaphoresis, fever and weight loss.       Night sweats  HENT: Negative for nosebleeds and sore throat.   Eyes: Negative for double vision.  Respiratory: Negative for cough, hemoptysis, sputum production, shortness of breath and wheezing.   Cardiovascular: Negative for chest pain, palpitations, orthopnea and leg swelling.  Gastrointestinal: Positive for abdominal pain and constipation. Negative for blood in stool, diarrhea, heartburn, melena, nausea and vomiting.  Genitourinary: Negative for dysuria, frequency and urgency.  Musculoskeletal: Positive for back pain. Negative for joint pain.  Skin: Negative.  Negative for itching and rash.  Neurological: Negative for dizziness, tingling, focal weakness, weakness and headaches.  Endo/Heme/Allergies: Does not bruise/bleed easily.  Psychiatric/Behavioral: Negative for depression. The patient is not nervous/anxious and does not have insomnia.      MEDICAL HISTORY:  Past Medical History:  Diagnosis Date  . [place]   . HYPERLIPIDEMIA 04/09/2008   Qualifier: Diagnosis of  By: YWynona Luna  . HYPOTHYROIDISM 04/09/2008   Qualifier: Diagnosis of  By: YWynona Luna  . INSOMNIA, CHRONIC 08/21/2010  Qualifier: Diagnosis of  By: Wynona Luna   . PERSONAL HISTORY MALIGNANT NEOPLASM THYROID 08/21/2010   Qualifier: Diagnosis of  By: Wynona Luna     SURGICAL HISTORY: Past Surgical  History:  Procedure Laterality Date  . THYROIDECTOMY, PARTIAL      SOCIAL HISTORY: Social History   Socioeconomic History  . Marital status: Married    Spouse name: Not on file  . Number of children: Not on file  . Years of education: Not on file  . Highest education level: Not on file  Occupational History  . Not on file  Social Needs  . Financial resource strain: Not on file  . Food insecurity    Worry: Not on file    Inability: Not on file  . Transportation needs    Medical: Not on file    Non-medical: Not on file  Tobacco Use  . Smoking status: Never Smoker  . Smokeless tobacco: Never Used  Substance and Sexual Activity  . Alcohol use: Yes  . Drug use: No  . Sexual activity: Yes  Lifestyle  . Physical activity    Days per week: Not on file    Minutes per session: Not on file  . Stress: Not on file  Relationships  . Social Herbalist on phone: Not on file    Gets together: Not on file    Attends religious service: Not on file    Active member of club or organization: Not on file    Attends meetings of clubs or organizations: Not on file    Relationship status: Not on file  . Intimate partner violence    Fear of current or ex partner: Not on file    Emotionally abused: Not on file    Physically abused: Not on file    Forced sexual activity: Not on file  Other Topics Concern  . 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 Arthritis Mother   . Hyperlipidemia Mother   . Hyperlipidemia Father   . Diabetes Father   . Hypertension Father   . Heart disease Father 55       CAD  . Lung cancer Father   . Breast cancer Maternal Grandmother        in 70s    ALLERGIES:  has No Known Allergies.  MEDICATIONS:  Current Outpatient Medications   Medication Sig Dispense Refill  . ALPRAZolam (NIRAVAM) 0.5 MG dissolvable tablet Take 1 tablet (0.5 mg total) by mouth 3 (three) times daily as needed for anxiety. 30 tablet 1  . Ascorbic Acid (VITAMIN C) 1000 MG tablet Take 1,000 mg by mouth daily.    Marland Kitchen HYDROcodone-acetaminophen (NORCO/VICODIN) 5-325 MG tablet Take 1 tablet by mouth every 6 (six) hours as needed for severe pain. 20 tablet 0  . Omega-3 Fatty Acids (FISH OIL) 1000 MG CAPS Take 2 capsules by mouth daily.    . Probiotic Product (PROBIOTIC-10) CHEW Chew 1 capsule by mouth.    . SYNTHROID 75 MCG tablet TAKE 1 TABLET (75 MCG TOTAL) BY MOUTH DAILY BEFORE BREAKFAST. 90 tablet 1  . traMADol (ULTRAM) 50 MG tablet Take 1 tablet (50 mg total) by mouth every 6 (six) hours as needed for moderate pain or severe pain. 30 tablet  0  . vitamin E 1000 UNIT capsule Take 4,000 Units by mouth daily.    . ondansetron (ZOFRAN) 4 MG tablet Take 1 tablet (4 mg total) by mouth every 8 (eight) hours as needed for nausea or vomiting. (Patient not taking: Reported on 06/20/2019) 30 tablet 1  . ondansetron (ZOFRAN) 8 MG tablet One pill every 8 hours as needed for nausea/vomitting. 40 tablet 1  . prochlorperazine (COMPAZINE) 10 MG tablet Take 1 tablet (10 mg total) by mouth every 6 (six) hours as needed for nausea or vomiting. 40 tablet 1  . zolpidem (AMBIEN) 10 MG tablet TAKE 1 TABLET BY MOUTH EVERY DAY AT BEDTIME AS NEEDED FOR SLEEP (Patient not taking: Reported on 06/20/2019) 30 tablet 3   No current facility-administered medications for this visit.     PHYSICAL EXAMINATION: ECOG PERFORMANCE STATUS: 1 - Symptomatic but completely ambulatory  Vitals:   06/23/19 1508  BP: 121/80  Pulse: 99  Temp: 98 F (36.7 C)   Filed Weights   06/23/19 1508  Weight: 131 lb 8 oz (59.6 kg)    Physical Exam  Constitutional: She is oriented to person, place, and time and well-developed, well-nourished, and in no distress.  HENT:  Head: Normocephalic and atraumatic.   Mouth/Throat: Oropharynx is clear and moist. No oropharyngeal exudate.  Eyes: Pupils are equal, round, and reactive to light.  Neck: Normal range of motion. Neck supple.  Cardiovascular: Normal rate and regular rhythm.  Pulmonary/Chest: No respiratory distress. She has no wheezes.  Abdominal: Soft. Bowel sounds are normal. She exhibits no distension and no mass. There is no abdominal tenderness. There is no rebound and no guarding.  Musculoskeletal: Normal range of motion.        General: No tenderness or edema.  Neurological: She is alert and oriented to person, place, and time.  Skin: Skin is warm.  Psychiatric: Affect normal.     LABORATORY DATA:  I have reviewed the data as listed Lab Results  Component Value Date   WBC 8.2 06/21/2019   HGB 12.8 06/21/2019   HCT 41.8 06/21/2019   MCV 91.3 06/21/2019   PLT 355 06/21/2019   Recent Labs    08/03/18 0809 06/13/19 1517 06/21/19 1024  NA 139 139 136  K 4.4 3.9 4.3  CL 103 102 102  CO2 29 30 27   GLUCOSE 100* 117* 97  BUN 16 20 15   CREATININE 0.88 0.89 0.67  CALCIUM 9.7 9.1 8.4*  GFRNONAA  --   --  >60  GFRAA  --   --  >60  PROT 7.3 6.9 7.5  ALBUMIN 4.4 3.7 3.4*  AST 15 31 28   ALT 10 22 20   ALKPHOS 63 120* 133*  BILITOT 0.8 0.4 0.5  BILIDIR 0.1 0.0  --     RADIOGRAPHIC STUDIES: I have personally reviewed the radiological images as listed and agreed with the findings in the report. Ct Abdomen Pelvis W Contrast  Result Date: 06/14/2019 CLINICAL DATA:  Severe left lower quadrant pain with abdominal distention/bloating. EXAM: CT ABDOMEN AND PELVIS WITH CONTRAST TECHNIQUE: Multidetector CT imaging of the abdomen and pelvis was performed using the standard protocol following bolus administration of intravenous contrast. CONTRAST:  58m OMNIPAQUE IOHEXOL 300 MG/ML  SOLN COMPARISON:  None. FINDINGS: Lower Chest: Multiple pulmonary nodules are seen in the visualized portion of the right lower lobe, largest measuring 12 mm.  Hepatobiliary: No hepatic masses identified. Gallbladder is unremarkable. No evidence of biliary ductal dilatation. Pancreas:  No mass or inflammatory  changes. Spleen: Within normal limits in size and appearance. Adrenals/Urinary Tract: No masses identified. Tiny right renal cyst noted. No evidence of hydronephrosis. Unremarkable unopacified urinary bladder. Stomach/Bowel: No evidence of acute inflammatory process or bowel obstruction. Abnormal nodular soft tissue density is seen in the right lower quadrant along the paracolic gutter in the expected location of the appendix. Vascular/Lymphatic: No pathologically enlarged lymph nodes. No abdominal aortic aneurysm. Reproductive: Uterus is normal in size. Heterogeneous soft tissue masses are seen in the adnexal regions bilaterally, measuring 4.3 x 3.6 cm on the left and 4.6 x 2.7 cm on the right. Diffuse omental soft tissue caking and diffuse peritoneal thickening are seen throughout the abdomen and pelvis as well as mild ascites. These findings are consistent with peritoneal carcinomatosis. Other:  None. Musculoskeletal:  No suspicious bone lesions identified. IMPRESSION: Diffuse peritoneal carcinomatosis and mild ascites. Differential diagnosis includes primary peritoneal carcinoma, and metastatic carcinoma from ovarian/fallopian tube, appendiceal, or extra-abdominal primaries. Multiple pulmonary nodules in visualized portion of right lung base, consistent with pulmonary metastases. Recommend chest CT for staging, and rule out possibility of lung or breast primary. These results will be called to the ordering clinician or representative by the Radiologist Assistant, and communication documented in the PACS or zVision Dashboard. Electronically Signed   By: Marlaine Hind M.D.   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  Patient also has multiple lung nodules-concerning for stage IV disease. CEA -elevated at 600+  #Patient currently awaiting CT-guided peritoneal mass-planned on 12/08.  Also awaiting CT scan of the chest on the same day.   #Recommend chemotherapy with carboplatinum Taxol every 3 weeks x 6 cycles; patient will need interim repeat imaging.  As per gynecology, patient not a candidate for debulking surgery if lung lesions positive for malignancy of GYN origin.   # Chemotherapy education;HOLD port placement. Hopefully the planned start chemotherapy next week. Antiemetics-Zofran and Compazine;to pharmacy.   # Discussed the potential side effects including but not limited to-increasing fatigue, nausea vomiting, diarrhea, hair loss, sores in the mouth, increase risk of infection and also neuropathy.   #Genetic counseling-importance of genetic testing/counseling discussed with patient and daughter.  Patient might benefit from BRCA inhibitors/also have the family screen for malignancy.  Also recommend HRD/NGS testing on tumor sample.  Thank you Dr.Berchuck for allowing me to participate in the care of your pleasant patient. Please do not hesitate to contact me with questions or concerns in the interim. Also discussed with Mathis Fare.   # DISPOSITION: # covid test early next week # genetic counseling referral # chemo education next week [not Tuesday] # dec 11th- MD; labs- cbc/cmp; Carbo-Taxol- Dr.B  Cc; Beckey Rutter  All questions were answered. The patient knows to call the clinic with any problems, questions or concerns.    Cammie Sickle, MD 06/26/2019 8:21 AM

## 2019-06-23 NOTE — Assessment & Plan Note (Addendum)
#  Peritoneal carcinomatosis-omental caking/bilateral adnexal masses-highly concerning for ovarian malignancy epithelial origin.  Patient also has multiple lung nodules-concerning for stage IV disease. CEA -elevated at 600+  #Patient currently awaiting CT-guided peritoneal mass-planned on 12/08.  Also awaiting CT scan of the chest on the same day.   #Recommend chemotherapy with carboplatinum Taxol every 3 weeks x 6 cycles; patient will need interim repeat imaging.  As per gynecology, patient not a candidate for debulking surgery if lung lesions positive for malignancy of GYN origin.   # Chemotherapy education;HOLD port placement. Hopefully the planned start chemotherapy next week. Antiemetics-Zofran and Compazine;to pharmacy.   # Discussed the potential side effects including but not limited to-increasing fatigue, nausea vomiting, diarrhea, hair loss, sores in the mouth, increase risk of infection and also neuropathy.   #Genetic counseling-importance of genetic testing/counseling discussed with patient and daughter.  Patient might benefit from BRCA inhibitors/also have the family screen for malignancy.  Also recommend HRD/NGS testing on tumor sample.  Thank you Dr.Berchuck for allowing me to participate in the care of your pleasant patient. Please do not hesitate to contact me with questions or concerns in the interim. Also discussed with Mathis Fare.   # DISPOSITION: # covid test early next week # genetic counseling referral # chemo education next week [not Tuesday] # dec 11th- MD; labs- cbc/cmp; Carbo-Taxol- Dr.B  Cc; Beckey Rutter

## 2019-06-23 NOTE — Telephone Encounter (Signed)
Called Amanda Pena with biopsy details. Biopsy has been scheduled for 12-8 at 1130 with arrival time of 1030. Directions to the medical mall given. Instructed she will need a driver. I will reschedule her chest CT as it conflicts with this appointment. She can expect a call from special procedures regarding any further instructions. She will see Dr. Rogue Bussing today at 1500 and will get updated schedule.

## 2019-06-25 ENCOUNTER — Other Ambulatory Visit: Payer: Self-pay

## 2019-06-25 DIAGNOSIS — C563 Malignant neoplasm of bilateral ovaries: Secondary | ICD-10-CM

## 2019-06-25 DIAGNOSIS — C562 Malignant neoplasm of left ovary: Secondary | ICD-10-CM

## 2019-06-26 ENCOUNTER — Telehealth: Payer: Self-pay

## 2019-06-26 ENCOUNTER — Encounter: Payer: Self-pay | Admitting: Internal Medicine

## 2019-06-26 ENCOUNTER — Other Ambulatory Visit: Payer: Self-pay | Admitting: Student

## 2019-06-26 NOTE — Progress Notes (Signed)
Patient called regarding bx 06/27/2019, aware to be here @ 1030, NPO after MN tonight, and someone to drive her home, stated understanding.

## 2019-06-26 NOTE — Telephone Encounter (Signed)
Received call from Ms. Freda Munro. We reviewed biopsy details again and answered questions regarding Port a cath. She will be receiving cycle one without a port. She is slightly nervous to receive it without one due to several people she knows telling her she needs to get a port. Support given.

## 2019-06-27 ENCOUNTER — Ambulatory Visit
Admission: RE | Admit: 2019-06-27 | Discharge: 2019-06-27 | Disposition: A | Discharge status: Home / Self Care | Payer: Self-pay | Source: Ambulatory Visit | Attending: Obstetrics and Gynecology | Admitting: Obstetrics and Gynecology

## 2019-06-27 ENCOUNTER — Other Ambulatory Visit: Payer: Self-pay

## 2019-06-27 ENCOUNTER — Other Ambulatory Visit: Payer: Self-pay | Admitting: Pathology

## 2019-06-27 ENCOUNTER — Ambulatory Visit: Payer: Self-pay

## 2019-06-27 DIAGNOSIS — R911 Solitary pulmonary nodule: Secondary | ICD-10-CM

## 2019-06-27 DIAGNOSIS — N9489 Other specified conditions associated with female genital organs and menstrual cycle: Secondary | ICD-10-CM

## 2019-06-27 DIAGNOSIS — C786 Secondary malignant neoplasm of retroperitoneum and peritoneum: Secondary | ICD-10-CM

## 2019-06-27 DIAGNOSIS — C801 Malignant (primary) neoplasm, unspecified: Secondary | ICD-10-CM | POA: Insufficient documentation

## 2019-06-27 DIAGNOSIS — R918 Other nonspecific abnormal finding of lung field: Secondary | ICD-10-CM

## 2019-06-27 LAB — CBC
HCT: 39.1 % (ref 36.0–46.0)
Hemoglobin: 12.5 g/dL (ref 12.0–15.0)
MCH: 27.7 pg (ref 26.0–34.0)
MCHC: 32 g/dL (ref 30.0–36.0)
MCV: 86.7 fL (ref 80.0–100.0)
Platelets: 391 10*3/uL (ref 150–400)
RBC: 4.51 MIL/uL (ref 3.87–5.11)
RDW: 12.5 % (ref 11.5–15.5)
WBC: 8.8 10*3/uL (ref 4.0–10.5)
nRBC: 0 % (ref 0.0–0.2)

## 2019-06-27 LAB — PROTIME-INR
INR: 1 (ref 0.8–1.2)
Prothrombin Time: 13 seconds (ref 11.4–15.2)

## 2019-06-27 MED ORDER — FENTANYL CITRATE (PF) 100 MCG/2ML IJ SOLN
INTRAMUSCULAR | Status: AC | PRN
  Administered 2019-06-27 (×2): 25 ug via INTRAVENOUS

## 2019-06-27 MED ORDER — MIDAZOLAM HCL 2 MG/2ML IJ SOLN
INTRAMUSCULAR | Status: AC | PRN
  Administered 2019-06-27: 1 mg via INTRAVENOUS
  Administered 2019-06-27: 2 mg via INTRAVENOUS

## 2019-06-27 MED ORDER — IOHEXOL 300 MG/ML  SOLN
75.0000 mL | Freq: Once | INTRAMUSCULAR | Status: AC | PRN
  Administered 2019-06-27: 75 mL via INTRAVENOUS

## 2019-06-27 MED ORDER — FENTANYL CITRATE (PF) 100 MCG/2ML IJ SOLN
INTRAMUSCULAR | Status: AC
  Filled 2019-06-27: qty 2

## 2019-06-27 MED ORDER — SODIUM CHLORIDE 0.9 % IV SOLN
INTRAVENOUS | Status: DC
  Administered 2019-06-27: 1000 mL via INTRAVENOUS

## 2019-06-27 MED ORDER — MIDAZOLAM HCL 5 MG/5ML IJ SOLN
INTRAMUSCULAR | Status: AC
  Filled 2019-06-27: qty 5

## 2019-06-27 NOTE — Patient Instructions (Signed)
Paclitaxel injection What is this medicine? PACLITAXEL (PAK li TAX el) is a chemotherapy drug. It targets fast dividing cells, like cancer cells, and causes these cells to die. This medicine is used to treat ovarian cancer, breast cancer, lung cancer, Kaposi's sarcoma, and other cancers. This medicine may be used for other purposes; ask your health care provider or pharmacist if you have questions. COMMON BRAND NAME(S): Onxol, Taxol What should I tell my health care provider before I take this medicine? They need to know if you have any of these conditions:  history of irregular heartbeat  liver disease  low blood counts, like low white cell, platelet, or red cell counts  lung or breathing disease, like asthma  tingling of the fingers or toes, or other nerve disorder  an unusual or allergic reaction to paclitaxel, alcohol, polyoxyethylated castor oil, other chemotherapy, other medicines, foods, dyes, or preservatives  pregnant or trying to get pregnant  breast-feeding How should I use this medicine? This drug is given as an infusion into a vein. It is administered in a hospital or clinic by a specially trained health care professional. Talk to your pediatrician regarding the use of this medicine in children. Special care may be needed. Overdosage: If you think you have taken too much of this medicine contact a poison control center or emergency room at once. NOTE: This medicine is only for you. Do not share this medicine with others. What if I miss a dose? It is important not to miss your dose. Call your doctor or health care professional if you are unable to keep an appointment. What may interact with this medicine? Do not take this medicine with any of the following medications:  disulfiram  metronidazole This medicine may also interact with the following medications:  antiviral medicines for hepatitis, HIV or AIDS  certain antibiotics like erythromycin and  clarithromycin  certain medicines for fungal infections like ketoconazole and itraconazole  certain medicines for seizures like carbamazepine, phenobarbital, phenytoin  gemfibrozil  nefazodone  rifampin  St. John's wort This list may not describe all possible interactions. Give your health care provider a list of all the medicines, herbs, non-prescription drugs, or dietary supplements you use. Also tell them if you smoke, drink alcohol, or use illegal drugs. Some items may interact with your medicine. What should I watch for while using this medicine? Your condition will be monitored carefully while you are receiving this medicine. You will need important blood work done while you are taking this medicine. This medicine can cause serious allergic reactions. To reduce your risk you will need to take other medicine(s) before treatment with this medicine. If you experience allergic reactions like skin rash, itching or hives, swelling of the face, lips, or tongue, tell your doctor or health care professional right away. In some cases, you may be given additional medicines to help with side effects. Follow all directions for their use. This drug may make you feel generally unwell. This is not uncommon, as chemotherapy can affect healthy cells as well as cancer cells. Report any side effects. Continue your course of treatment even though you feel ill unless your doctor tells you to stop. Call your doctor or health care professional for advice if you get a fever, chills or sore throat, or other symptoms of a cold or flu. Do not treat yourself. This drug decreases your body's ability to fight infections. Try to avoid being around people who are sick. This medicine may increase your risk to bruise   or bleed. Call your doctor or health care professional if you notice any unusual bleeding. Be careful brushing and flossing your teeth or using a toothpick because you may get an infection or bleed more easily.  If you have any dental work done, tell your dentist you are receiving this medicine. Avoid taking products that contain aspirin, acetaminophen, ibuprofen, naproxen, or ketoprofen unless instructed by your doctor. These medicines may hide a fever. Do not become pregnant while taking this medicine. Women should inform their doctor if they wish to become pregnant or think they might be pregnant. There is a potential for serious side effects to an unborn child. Talk to your health care professional or pharmacist for more information. Do not breast-feed an infant while taking this medicine. Men are advised not to father a child while receiving this medicine. This product may contain alcohol. Ask your pharmacist or healthcare provider if this medicine contains alcohol. Be sure to tell all healthcare providers you are taking this medicine. Certain medicines, like metronidazole and disulfiram, can cause an unpleasant reaction when taken with alcohol. The reaction includes flushing, headache, nausea, vomiting, sweating, and increased thirst. The reaction can last from 30 minutes to several hours. What side effects may I notice from receiving this medicine? Side effects that you should report to your doctor or health care professional as soon as possible:  allergic reactions like skin rash, itching or hives, swelling of the face, lips, or tongue  breathing problems  changes in vision  fast, irregular heartbeat  high or low blood pressure  mouth sores  pain, tingling, numbness in the hands or feet  signs of decreased platelets or bleeding - bruising, pinpoint red spots on the skin, black, tarry stools, blood in the urine  signs of decreased red blood cells - unusually weak or tired, feeling faint or lightheaded, falls  signs of infection - fever or chills, cough, sore throat, pain or difficulty passing urine  signs and symptoms of liver injury like dark yellow or brown urine; general ill feeling or  flu-like symptoms; light-colored stools; loss of appetite; nausea; right upper belly pain; unusually weak or tired; yellowing of the eyes or skin  swelling of the ankles, feet, hands  unusually slow heartbeat Side effects that usually do not require medical attention (report to your doctor or health care professional if they continue or are bothersome):  diarrhea  hair loss  loss of appetite  muscle or joint pain  nausea, vomiting  pain, redness, or irritation at site where injected  tiredness This list may not describe all possible side effects. Call your doctor for medical advice about side effects. You may report side effects to FDA at 1-800-FDA-1088. Where should I keep my medicine? This drug is given in a hospital or clinic and will not be stored at home. NOTE: This sheet is a summary. It may not cover all possible information. If you have questions about this medicine, talk to your doctor, pharmacist, or health care provider.  2020 Elsevier/Gold Standard (2017-03-09 13:14:55) Carboplatin injection What is this medicine? CARBOPLATIN (KAR boe pla tin) is a chemotherapy drug. It targets fast dividing cells, like cancer cells, and causes these cells to die. This medicine is used to treat ovarian cancer and many other cancers. This medicine may be used for other purposes; ask your health care provider or pharmacist if you have questions. COMMON BRAND NAME(S): Paraplatin What should I tell my health care provider before I take this medicine? They need to   know if you have any of these conditions:  blood disorders  hearing problems  kidney disease  recent or ongoing radiation therapy  an unusual or allergic reaction to carboplatin, cisplatin, other chemotherapy, other medicines, foods, dyes, or preservatives  pregnant or trying to get pregnant  breast-feeding How should I use this medicine? This drug is usually given as an infusion into a vein. It is administered in a  hospital or clinic by a specially trained health care professional. Talk to your pediatrician regarding the use of this medicine in children. Special care may be needed. Overdosage: If you think you have taken too much of this medicine contact a poison control center or emergency room at once. NOTE: This medicine is only for you. Do not share this medicine with others. What if I miss a dose? It is important not to miss a dose. Call your doctor or health care professional if you are unable to keep an appointment. What may interact with this medicine?  medicines for seizures  medicines to increase blood counts like filgrastim, pegfilgrastim, sargramostim  some antibiotics like amikacin, gentamicin, neomycin, streptomycin, tobramycin  vaccines Talk to your doctor or health care professional before taking any of these medicines:  acetaminophen  aspirin  ibuprofen  ketoprofen  naproxen This list may not describe all possible interactions. Give your health care provider a list of all the medicines, herbs, non-prescription drugs, or dietary supplements you use. Also tell them if you smoke, drink alcohol, or use illegal drugs. Some items may interact with your medicine. What should I watch for while using this medicine? Your condition will be monitored carefully while you are receiving this medicine. You will need important blood work done while you are taking this medicine. This drug may make you feel generally unwell. This is not uncommon, as chemotherapy can affect healthy cells as well as cancer cells. Report any side effects. Continue your course of treatment even though you feel ill unless your doctor tells you to stop. In some cases, you may be given additional medicines to help with side effects. Follow all directions for their use. Call your doctor or health care professional for advice if you get a fever, chills or sore throat, or other symptoms of a cold or flu. Do not treat  yourself. This drug decreases your body's ability to fight infections. Try to avoid being around people who are sick. This medicine may increase your risk to bruise or bleed. Call your doctor or health care professional if you notice any unusual bleeding. Be careful brushing and flossing your teeth or using a toothpick because you may get an infection or bleed more easily. If you have any dental work done, tell your dentist you are receiving this medicine. Avoid taking products that contain aspirin, acetaminophen, ibuprofen, naproxen, or ketoprofen unless instructed by your doctor. These medicines may hide a fever. Do not become pregnant while taking this medicine. Women should inform their doctor if they wish to become pregnant or think they might be pregnant. There is a potential for serious side effects to an unborn child. Talk to your health care professional or pharmacist for more information. Do not breast-feed an infant while taking this medicine. What side effects may I notice from receiving this medicine? Side effects that you should report to your doctor or health care professional as soon as possible:  allergic reactions like skin rash, itching or hives, swelling of the face, lips, or tongue  signs of infection - fever or   chills, cough, sore throat, pain or difficulty passing urine  signs of decreased platelets or bleeding - bruising, pinpoint red spots on the skin, black, tarry stools, nosebleeds  signs of decreased red blood cells - unusually weak or tired, fainting spells, lightheadedness  breathing problems  changes in hearing  changes in vision  chest pain  high blood pressure  low blood counts - This drug may decrease the number of white blood cells, red blood cells and platelets. You may be at increased risk for infections and bleeding.  nausea and vomiting  pain, swelling, redness or irritation at the injection site  pain, tingling, numbness in the hands or  feet  problems with balance, talking, walking  trouble passing urine or change in the amount of urine Side effects that usually do not require medical attention (report to your doctor or health care professional if they continue or are bothersome):  hair loss  loss of appetite  metallic taste in the mouth or changes in taste This list may not describe all possible side effects. Call your doctor for medical advice about side effects. You may report side effects to FDA at 1-800-FDA-1088. Where should I keep my medicine? This drug is given in a hospital or clinic and will not be stored at home. NOTE: This sheet is a summary. It may not cover all possible information. If you have questions about this medicine, talk to your doctor, pharmacist, or health care provider.  2020 Elsevier/Gold Standard (2007-10-11 14:38:05)  

## 2019-06-27 NOTE — H&P (Signed)
Chief Complaint: Patient was seen in consultation today for No chief complaint on file.  at the request of [place]  Referring Physician(s): Berchuck,Andrew  Supervising Physician:Register   History of Present Illness: Amanda Pena is a 63 y.o. female  In for bx of omental mass.  Past Medical History:  Diagnosis Date  . Cancer ([place])   . HYPERLIPIDEMIA 04/09/2008   Qualifier: Diagnosis of  By: Wynona Luna   . HYPOTHYROIDISM 04/09/2008   Qualifier: Diagnosis of  By: Wynona Luna   . INSOMNIA, CHRONIC 08/21/2010   Qualifier: Diagnosis of  By: Wynona Luna   . 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 THYROIDECTOMY, PARTIAL      Allergies: Patient has no known allergies.  Medications: Prior to Admission medications   Medication Sig Start Date End Date Taking? Authorizing Provider  ALPRAZolam (NIRAVAM) 0.5 MG dissolvable tablet Take 1 tablet (0.5 mg total) by mouth 3 (three) times daily as needed for anxiety. 06/14/19  Yes Plotnikov, [name], MD  Ascorbic Acid (VITAMIN C) 1000 MG tablet Take 1,000 mg by mouth daily.   Yes [provider]  HYDROcodone-acetaminophen (NORCO/VICODIN) 5-325 MG tablet Take 1 tablet by mouth every 6 (six) hours as needed for severe pain. 06/14/19 06/13/20 Yes Plotnikov, [name], MD  Omega-3 Fatty Acids (FISH OIL) 1000 MG CAPS Take 2 capsules by mouth daily.   Yes [provider]  Probiotic Product (PROBIOTIC-10) CHEW Chew 1 capsule by mouth.   Yes [provider]  SYNTHROID 75 MCG tablet TAKE 1 TABLET (75 MCG TOTAL) BY MOUTH DAILY BEFORE BREAKFAST. 03/07/19  Yes Plotnikov, [name], MD  vitamin E 1000 UNIT capsule Take 4,000 Units by mouth daily.   Yes [provider]  ondansetron (ZOFRAN) 4 MG tablet Take 1 tablet (4 mg total) by mouth every 8 (eight) hours as  needed for nausea or vomiting. Patient not taking: Reported on 06/20/2019 06/14/19   Plotnikov, [name], MD  ondansetron (ZOFRAN) 8 MG tablet One pill every 8 hours as needed for nausea/vomitting. 06/23/19   Cammie Sickle, MD  prochlorperazine (COMPAZINE) 10 MG tablet Take 1 tablet (10 mg total) by mouth every 6 (six) hours as needed for nausea or vomiting. 06/23/19   Cammie Sickle, MD  traMADol (ULTRAM) 50 MG tablet Take 1 tablet (50 mg total) by mouth every 6 (six) hours as needed for moderate pain or severe pain. 06/22/19   Verlon Au, NP  zolpidem (AMBIEN) 10 MG tablet TAKE 1 TABLET BY MOUTH EVERY DAY AT BEDTIME AS NEEDED FOR SLEEP Patient not taking: Reported on 06/20/2019 02/19/19   Plotnikov, [name], MD  rosuvastatin (CRESTOR) 5 MG tablet Take 1 tablet (5 mg total) by mouth daily. 05/17/19 05/17/19  Plotnikov, [name], MD     Family History  Problem Relation Age of Onset  . Arthritis Mother   . Hyperlipidemia Mother   . Hyperlipidemia Father   . Diabetes Father   . Hypertension Father   . Heart disease Father 26       CAD  . Lung cancer Father   . Breast cancer Maternal Grandmother        in 23s    Social History   Socioeconomic History  . Marital status: Married    Spouse name: Not on file  .  Number of children: Not on file  . Years of education: Not on file  . Highest education level: Not on file  Occupational History  . Not on file  Social Needs  . Financial resource strain: Not on file  . Food insecurity    Worry: Not on file    Inability: Not on file  . Transportation needs    Medical: Not on file    Non-medical: Not on file  Tobacco Use  . Smoking status: Never Smoker  . Smokeless tobacco: Never Used  Substance and Sexual Activity  . Alcohol use: Yes  . Drug use: No  . Sexual activity: Yes  Lifestyle  . Physical activity    Days per week: Not on file    Minutes per session: Not on file  . Stress: Not on file  Relationships  .  Social Herbalist on phone: Not on file    Gets together: Not on file    Attends religious service: Not on file    Active member of club or organization: Not on file    Attends meetings of clubs or organizations: Not on file    Relationship status: Not on file  Other Topics Concern  . 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  All other systems are negative.  Review of Systems  Vital Signs: BP 114/71   Pulse (!) 101   Temp 98.2 F (36.8 C) (Oral)   Resp 14   Ht 5\' 6"  (1.676 m)   Wt 58.5 kg   SpO2 97%   BMI 20.82 kg/m   Physical ExamPt alert,Ox3.VSS.HENT-clear.Chest clear.Abd benign.  Imaging: Ct Abdomen Pelvis W Contrast  Result Date: 06/14/2019 CLINICAL DATA:  Severe left lower quadrant pain with abdominal distention/bloating. EXAM: CT ABDOMEN AND PELVIS WITH CONTRAST TECHNIQUE: Multidetector CT imaging of the abdomen and pelvis was performed using the standard protocol following bolus administration of intravenous contrast. CONTRAST:  107mL OMNIPAQUE IOHEXOL 300 MG/ML  SOLN COMPARISON:  None. FINDINGS: Lower Chest: Multiple pulmonary nodules are seen in the visualized portion of the right lower lobe, largest measuring 12 mm. Hepatobiliary: No hepatic masses identified. Gallbladder is unremarkable. No evidence of biliary ductal dilatation. Pancreas:  No mass or inflammatory changes. Spleen: Within normal limits in size and appearance. Adrenals/Urinary Tract: No masses identified. Tiny right renal cyst noted. No evidence of hydronephrosis. Unremarkable unopacified urinary bladder. Stomach/Bowel: No evidence of acute inflammatory process or bowel obstruction. Abnormal nodular soft tissue density is seen in the right lower quadrant  along the paracolic gutter in the expected location of the appendix. Vascular/Lymphatic: No pathologically enlarged lymph nodes. No abdominal aortic aneurysm. Reproductive: Uterus is normal in size. Heterogeneous soft tissue masses are seen in the adnexal regions bilaterally, measuring 4.3 x 3.6 cm on the left and 4.6 x 2.7 cm on the right. Diffuse omental soft tissue caking and diffuse peritoneal thickening are seen throughout the abdomen and pelvis as well as mild ascites. These findings are consistent with peritoneal carcinomatosis. Other:  None. Musculoskeletal:  No suspicious bone lesions identified. IMPRESSION: Diffuse peritoneal carcinomatosis and mild ascites. Differential diagnosis includes primary peritoneal carcinoma, and metastatic carcinoma from ovarian/fallopian tube, appendiceal, or extra-abdominal primaries. Multiple pulmonary nodules in visualized portion of  right lung base, consistent with pulmonary metastases. Recommend chest CT for staging, and rule out possibility of lung or breast primary. These results will be called to the ordering clinician or representative by the Radiologist Assistant, and communication documented in the PACS or zVision Dashboard. Electronically Signed   By: Marlaine Hind M.D.   On: 06/14/2019 16:28    Labs:  CBC: Recent Labs    08/03/18 0809 06/13/19 1517 06/21/19 1024 06/27/19 1050  WBC 4.7 9.7 8.2 8.8  HGB 14.5 12.3 12.8 12.5  HCT 42.1 38.3 41.8 39.1  PLT 182.0 273.0 355 391    COAGS: Recent Labs    06/27/19 1050  INR 1.0    BMP: Recent Labs    08/03/18 0809 06/13/19 1517 06/21/19 1024  NA 139 139 136  K 4.4 3.9 4.3  CL 103 102 102  CO2 29 30 27   GLUCOSE 100* 117* 97  BUN 16 20 15   CALCIUM 9.7 9.1 8.4*  CREATININE 0.88 0.89 0.67  GFRNONAA  --   --  >60  GFRAA  --   --  >60    LIVER FUNCTION TESTS: Recent Labs    08/03/18 0809 06/13/19 1517 06/21/19 1024  BILITOT 0.8 0.4 0.5  AST 15 31 28   ALT 10 22 20   ALKPHOS 63 120*  133*  PROT 7.3 6.9 7.5  ALBUMIN 4.4 3.7 3.4*    TUMOR MARKERS: No results for input(s): AFPTM, CEA, CA199, CHROMGRNA in the last 8760 hours.  Assessment and Plan:Omental mass-CT bx.  Thank you for this interesting consult.  I greatly enjoyed meeting Arihana Goepfert and look forward to participating in their care.  A copy of this report was sent to the requesting provider on this date.  Electronically Signed: Register, Melanie Crazier, MD 06/27/2019, 11:36 AM   I spent a total of 20 min  in face to face in clinical consultation, greater than 50% of which was counseling/coordinating care for this pt.

## 2019-06-27 NOTE — Progress Notes (Signed)
Pt stable after omental bx.Vss,abd benign.F/u with her MD.D/c instructions given.

## 2019-06-27 NOTE — Progress Notes (Signed)
Patient clinically stable post Omental biopsy per Dr Register, denies complaints at this time. Awake/alert and oriented post procedure. Received Versed 3mg /Fentanyl 12mcg IV for procedure. bandade dressing dry and intact. Report given to Genelle Bal Rn with questions answered.

## 2019-06-28 ENCOUNTER — Other Ambulatory Visit: Payer: Self-pay | Admitting: Internal Medicine

## 2019-06-28 ENCOUNTER — Other Ambulatory Visit: Payer: Self-pay

## 2019-06-28 ENCOUNTER — Inpatient Hospital Stay (HOSPITAL_BASED_OUTPATIENT_CLINIC_OR_DEPARTMENT_OTHER): Payer: Self-pay | Admitting: Oncology

## 2019-06-28 ENCOUNTER — Inpatient Hospital Stay: Payer: Self-pay

## 2019-06-28 ENCOUNTER — Ambulatory Visit: Payer: Self-pay | Admitting: Internal Medicine

## 2019-06-28 DIAGNOSIS — C561 Malignant neoplasm of right ovary: Secondary | ICD-10-CM

## 2019-06-28 DIAGNOSIS — C562 Malignant neoplasm of left ovary: Secondary | ICD-10-CM

## 2019-06-28 DIAGNOSIS — C563 Malignant neoplasm of bilateral ovaries: Secondary | ICD-10-CM

## 2019-06-28 MED ORDER — HYDROCODONE-ACETAMINOPHEN 5-325 MG PO TABS: 1.0000 | ORAL_TABLET | Freq: Four times a day (QID) | ORAL | 0 refills | Status: AC | PRN

## 2019-06-28 NOTE — Progress Notes (Signed)
As per Drue Dun- pt hurting in abodmen- will refill hydrocodone. S/p Bx - 12/08; await results; plan chemo on 12/11 based on biopsy results.

## 2019-06-29 ENCOUNTER — Other Ambulatory Visit: Payer: Self-pay

## 2019-06-29 ENCOUNTER — Telehealth: Payer: Self-pay

## 2019-06-29 LAB — SURGICAL PATHOLOGY

## 2019-06-29 NOTE — Telephone Encounter (Signed)
Call returned to Amanda Pena regarding starting chemotherapy as scheduled, 12/11. 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 She will start her chemotherapy as planned. Dr. Rogue Bussing will discuss her pathology fully with her tomorrow in clinic.

## 2019-06-29 NOTE — Progress Notes (Signed)
[place] CONSULT NOTE  Patient Care Team: Plotnikov, [name], MD as PCP - General (Internal Medicine) Arvella Nigh, MD as Consulting Physician (Obstetrics and Gynecology) Clent Jacks, RN as Oncology Nurse Navigator  CHIEF COMPLAINTS/PURPOSE OF CONSULTATION: Peritoneal carcinomatosis  #  Oncology History Overview Note  #November 2020-omental caking/peritoneal carcinomatosis; bilateral adnexal masses 4-5 cm in size; multiple lung nodules;   # dec 8th 2020-omental biopsy; positive for high-grade serous adenocarcinoma; GYN origin.  CT scan chest-bilateral lung nodules; 3.8 cm soft tissue mass adjacent to GE junction  # DEC 11TH 2020-CARBO-Taxol [chemo consent]  # Thyroid cancer at 26y s/p thyroidectomy [no RAIU]  # NGS/MOLECULAR TESTS: P  # PALLIATIVE CARE EVALUATION:P  # PAIN MANAGEMENT: P   DIAGNOSIS: Bilateral ovarian cancer  STAGE: IV        ;  GOALS: Control  CURRENT/MOST RECENT THERAPY : Carbotaxol    Peritoneal carcinoma ([place])  06/23/2019 Initial Diagnosis   Peritoneal carcinoma ([place])   06/30/2019 -  Chemotherapy   The patient had palonosetron (ALOXI) injection 0.25 mg, 0.25 mg, Intravenous,  Once, 1 of 6 cycles CARBOplatin (PARAPLATIN) 570 mg in sodium chloride 0.9 % 250 mL chemo infusion, 570 mg (100 % of original dose 567 mg), Intravenous,  Once, 1 of 6 cycles Dose modification:   (original dose 567 mg, Cycle 1) fosaprepitant (EMEND) 150 mg in sodium chloride 0.9 % 145 mL IVPB, 150 mg, Intravenous,  Once, 1 of 6 cycles PACLitaxel (TAXOL) 288 mg in sodium chloride 0.9 % 250 mL chemo infusion (> 80mg /m2), 175 mg/m2 = 288 mg, Intravenous,  Once, 1 of 6 cycles  for chemotherapy treatment.       HISTORY OF PRESENTING ILLNESS:  Kylani Conyer 63 y.o.  female newly diagnosed peritoneal carcinomatosis/bilateral adnexal masses is here to review the results of her CT scan chest/omental biopsy-interested with chemotherapy.  Patient noted to  have mild rash on her abdomen.  Not itchy.  No new medications.  Otherwise continues abdominal discomfort/distention.  Continues to have abdominal pain-needed hydrocodone 1 pill at nighttime.  No difficulty swallowing pain with swallowing.  Review of Systems  Constitutional: Positive for malaise/fatigue. Negative for chills, diaphoresis, fever and weight loss.       Night sweats  HENT: Negative for nosebleeds and sore throat.   Eyes: Negative for double vision.  Respiratory: Negative for cough, hemoptysis, sputum production, shortness of breath and wheezing.   Cardiovascular: Negative for chest pain, palpitations, orthopnea and leg swelling.  Gastrointestinal: Positive for abdominal pain and constipation. Negative for blood in stool, diarrhea, heartburn, melena, nausea and vomiting.  Genitourinary: Negative for dysuria, frequency and urgency.  Musculoskeletal: Positive for back pain. Negative for joint pain.  Skin: Positive for rash. Negative for itching.  Neurological: Negative for dizziness, tingling, focal weakness, weakness and headaches.  Endo/Heme/Allergies: Does not bruise/bleed easily.  Psychiatric/Behavioral: Negative for depression. The patient is not nervous/anxious and does not have insomnia.      MEDICAL HISTORY:  Past Medical History:  Diagnosis Date  . Cancer ([place])   . HYPERLIPIDEMIA 04/09/2008   Qualifier: Diagnosis of  By: Wynona Luna   . HYPOTHYROIDISM 04/09/2008   Qualifier: Diagnosis of  By: Wynona Luna   . INSOMNIA, CHRONIC 08/21/2010   Qualifier: Diagnosis of  By: Wynona Luna   . PERSONAL HISTORY MALIGNANT NEOPLASM THYROID 08/21/2010   Qualifier: Diagnosis of  By: Wynona Luna     SURGICAL HISTORY: Past Surgical  History:  Procedure Laterality Date  . THYROIDECTOMY, PARTIAL      SOCIAL HISTORY: Social History   Socioeconomic History  . Marital status: Married    Spouse name: Not on file  . Number of children: Not on file  . Years  of education: Not on file  . Highest education level: Not on file  Occupational History  . Not on file  Tobacco Use  . Smoking status: Never Smoker  . Smokeless tobacco: Never Used  Substance and Sexual Activity  . Alcohol use: Yes  . Drug use: No  . Sexual activity: Yes  Other Topics Concern  . 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 Difficulty of Paying Living Expenses: Not on file  Food Insecurity:   . Worried About Charity fundraiser in the Last Year: Not on file  . Ran Out of Food in the Last Year: Not on file  Transportation Needs:   . Lack of Transportation (Medical): Not on file  . Lack of Transportation (Non-Medical): Not on file  Physical Activity:   . Days of Exercise per Week: Not on file  . Minutes of Exercise per Session: Not on file  Stress:   . Feeling of Stress : Not on file  Social Connections:   . Frequency of Communication with Friends and Family: Not on file  . Frequency of Social Gatherings with Friends and Family: Not on file  . Attends Religious Services: Not on file  . Active Member of Clubs or Organizations: Not on file  . Attends Archivist Meetings: Not on file  . Marital Status: Not on file  Intimate Partner Violence:   . Fear of Current or Ex-Partner: Not on file  . Emotionally Abused: Not on file  . Physically Abused: Not on file  . Sexually Abused: Not on file    FAMILY HISTORY: Family History  Problem Relation Age of Onset  . Arthritis Mother   . Hyperlipidemia Mother   . Hyperlipidemia Father   . Diabetes Father   . Hypertension Father   . Heart disease Father 51       CAD  . Lung cancer Father   . Breast cancer Maternal Grandmother        in 46s    ALLERGIES:  has No Known Allergies.  MEDICATIONS:  Current  Outpatient Medications  Medication Sig Dispense Refill  . ALPRAZolam (NIRAVAM) 0.5 MG dissolvable tablet Take 1 tablet (0.5 mg total) by mouth 3 (three) times daily as needed for anxiety. 30 tablet 1  . Ascorbic Acid (VITAMIN C) 1000 MG tablet Take 1,000 mg by mouth daily.    Marland Kitchen HYDROcodone-acetaminophen (NORCO/VICODIN) 5-325 MG tablet Take 1 tablet by mouth every 6 (six) hours as needed for severe pain. 45 tablet 0  . Omega-3 Fatty Acids (FISH OIL) 1000 MG CAPS Take 2 capsules by mouth daily.    Marland Kitchen SYNTHROID 75 MCG tablet TAKE 1 TABLET (75 MCG TOTAL) BY MOUTH DAILY BEFORE BREAKFAST. 90 tablet 1  . vitamin E 1000 UNIT capsule Take 4,000 Units by mouth daily.    . ondansetron (ZOFRAN) 4 MG tablet Take 1 tablet (4 mg total) by mouth every 8 (eight) hours as needed for nausea or vomiting. (Patient not taking: Reported on 06/20/2019) 30 tablet 1  .  ondansetron (ZOFRAN) 8 MG tablet One pill every 8 hours as needed for nausea/vomitting. (Patient not taking: Reported on 06/29/2019) 40 tablet 1  . Probiotic Product (PROBIOTIC-10) CHEW Chew 1 capsule by mouth.    . prochlorperazine (COMPAZINE) 10 MG tablet Take 1 tablet (10 mg total) by mouth every 6 (six) hours as needed for nausea or vomiting. (Patient not taking: Reported on 06/29/2019) 40 tablet 1  . traMADol (ULTRAM) 50 MG tablet Take 1 tablet (50 mg total) by mouth every 6 (six) hours as needed for moderate pain or severe pain. (Patient not taking: Reported on 06/29/2019) 30 tablet 0  . zolpidem (AMBIEN) 10 MG tablet TAKE 1 TABLET BY MOUTH EVERY DAY AT BEDTIME AS NEEDED FOR SLEEP (Patient not taking: Reported on 06/20/2019) 30 tablet 3   No current facility-administered medications for this visit.   Facility-Administered Medications Ordered in Other Visits  Medication Dose Route Frequency Provider Last Rate Last Admin  . CARBOplatin (PARAPLATIN) 570 mg in sodium chloride 0.9 % 250 mL chemo infusion  570 mg Intravenous Once Charlaine Dalton R, MD       . famotidine (PEPCID) IVPB 20 mg premix  20 mg Intravenous Once Charlaine Dalton R, MD      . fosaprepitant (EMEND) 150 mg, dexamethasone (DECADRON) 12 mg in sodium chloride 0.9 % 145 mL IVPB   Intravenous Once Cammie Sickle, MD 454 mL/hr at 06/30/19 0945 New Bag at 06/30/19 0945  . PACLitaxel (TAXOL) 288 mg in sodium chloride 0.9 % 250 mL chemo infusion (> 80mg /m2)  175 mg/m2 (Treatment Plan Recorded) Intravenous Once Cammie Sickle, MD        PHYSICAL EXAMINATION: ECOG PERFORMANCE STATUS: 1 - Symptomatic but completely ambulatory  Vitals:   06/30/19 0837  BP: 110/73  Pulse: (!) 101  Temp: (!) 97.4 F (36.3 C)   Filed Weights   06/30/19 0837  Weight: 129 lb (58.5 kg)    Physical Exam  Constitutional: She is oriented to person, place, and time and well-developed, well-nourished, and in no distress.  Accompanied by daughter.  Walking independently.  HENT:  Head: Normocephalic and atraumatic.  Mouth/Throat: Oropharynx is clear and moist. No oropharyngeal exudate.  Eyes: Pupils are equal, round, and reactive to light.  Cardiovascular: Normal rate and regular rhythm.  Pulmonary/Chest: Effort normal and breath sounds normal. No respiratory distress. She has no wheezes.  Abdominal: Soft. Bowel sounds are normal. She exhibits distension. She exhibits no mass. There is no abdominal tenderness. There is no rebound and no guarding.  Musculoskeletal:        General: No tenderness or edema. Normal range of motion.     Cervical back: Normal range of motion and neck supple.  Neurological: She is alert and oriented to person, place, and time.  Skin: Skin is warm.  Mild rash on the abdomen.  Psychiatric: Affect normal.     LABORATORY DATA:  I have reviewed the data as listed Lab Results  Component Value Date   WBC 7.9 06/30/2019   HGB 12.3 06/30/2019   HCT 40.4 06/30/2019   MCV 90.4 06/30/2019   PLT 414 (H) 06/30/2019   Recent Labs    08/03/18 0809  06/13/19 1517 06/21/19 1024 06/30/19 0817  NA 139 139 136 139  K 4.4 3.9 4.3 4.2  CL 103 102 102 99  CO2 29 30 27 28   GLUCOSE 100* 117* 97 110*  BUN 16 20 15 14   CREATININE 0.88 0.89 0.67 0.77  CALCIUM 9.7 9.1 8.4*  8.8*  GFRNONAA  --   --  >60 >60  GFRAA  --   --  >60 >60  PROT 7.3 6.9 7.5 6.7  ALBUMIN 4.4 3.7 3.4* 2.8*  AST 15 31 28 31   ALT 10 22 20 23   ALKPHOS 63 120* 133* 176*  BILITOT 0.8 0.4 0.5 0.5  BILIDIR 0.1 0.0  --   --     RADIOGRAPHIC STUDIES: I have personally reviewed the radiological images as listed and agreed with the findings in the report. CT Chest W Contrast  Result Date: 06/27/2019 CLINICAL DATA:  63 y.o. female diagnosed with probable stage IV epithelial ovarian cancer with lung metastases Fully assess lung nodules, non smoker. Lung nodule, >=1cm Known bilateral adnexal masses; pulmonary nodules in RLL lung. Staging EXAM: CT CHEST WITH CONTRAST TECHNIQUE: Multidetector CT imaging of the chest was performed during intravenous contrast administration. CONTRAST:  23mL OMNIPAQUE IOHEXOL 300 MG/ML  SOLN COMPARISON:  CT of the abdomen and pelvis on 06/14/2019 FINDINGS: Cardiovascular: Heart size is normal. There is atherosclerotic calcification of coronary vessels. No pericardial effusion. Mediastinum/Nodes: There is a soft tissue mass adjacent to the GE junction, measuring 3.8 x 2.2 centimeters. UPPER and mid esophagus are normal in appearance. Status post resection of the LEFT lobe of the thyroid gland. RIGHT lobe is unremarkable. No significant mediastinal, hilar, or axillary adenopathy. Lungs/Pleura: Multiple nodules are identified in the RIGHT LOWER lobe, largest measuring 1.2 x 0.8 centimeters on image 100 of series 3. There are at least 6 other nodules within the RIGHT LOWER lobe, measuring 9 millimeters and smaller. 3 millimeter nodule is identified in the posterior LEFT UPPER lobe on image 47 of series 3. There is small RIGHT pleural effusion. Minimal subsegmental  atelectasis at the RIGHT lung base. Upper Abdomen: GE junction mass, measured above. Ascites. Musculoskeletal: No suspicious lytic or blastic lesions are identified. IMPRESSION: 1. Multiple bilateral pulmonary nodules, suspicious for metastatic disease. Largest mass is mean of 1.0 centimeters in the RIGHT LOWER lobe. 2. Soft tissue mass adjacent to the gastroesophageal junction, suspicious for metastasis. 3. RIGHT pleural effusion and RIGHT basilar atelectasis. 4. Ascites. 5. Coronary artery disease. 6. Status post resection of the LEFT lobe of the thyroid gland. 7. Aortic Atherosclerosis (ICD10-I70.0). Electronically Signed   By: Nolon Nations M.D.   On: 06/27/2019 16:27   CT Abdomen Pelvis W Contrast  Result Date: 06/14/2019 CLINICAL DATA:  Severe left lower quadrant pain with abdominal distention/bloating. EXAM: CT ABDOMEN AND PELVIS WITH CONTRAST TECHNIQUE: Multidetector CT imaging of the abdomen and pelvis was performed using the standard protocol following bolus administration of intravenous contrast. CONTRAST:  8mL OMNIPAQUE IOHEXOL 300 MG/ML  SOLN COMPARISON:  None. FINDINGS: Lower Chest: Multiple pulmonary nodules are seen in the visualized portion of the right lower lobe, largest measuring 12 mm. Hepatobiliary: No hepatic masses identified. Gallbladder is unremarkable. No evidence of biliary ductal dilatation. Pancreas:  No mass or inflammatory changes. Spleen: Within normal limits in size and appearance. Adrenals/Urinary Tract: No masses identified. Tiny right renal cyst noted. No evidence of hydronephrosis. Unremarkable unopacified urinary bladder. Stomach/Bowel: No evidence of acute inflammatory process or bowel obstruction. Abnormal nodular soft tissue density is seen in the right lower quadrant along the paracolic gutter in the expected location of the appendix. Vascular/Lymphatic: No pathologically enlarged lymph nodes. No abdominal aortic aneurysm. Reproductive: Uterus is normal in size.  Heterogeneous soft tissue masses are seen in the adnexal regions bilaterally, measuring 4.3 x 3.6 cm on the left and 4.6  x 2.7 cm on the right. Diffuse omental soft tissue caking and diffuse peritoneal thickening are seen throughout the abdomen and pelvis as well as mild ascites. These findings are consistent with peritoneal carcinomatosis. Other:  None. Musculoskeletal:  No suspicious bone lesions identified. IMPRESSION: Diffuse peritoneal carcinomatosis and mild ascites. Differential diagnosis includes primary peritoneal carcinoma, and metastatic carcinoma from ovarian/fallopian tube, appendiceal, or extra-abdominal primaries. Multiple pulmonary nodules in visualized portion of right lung base, consistent with pulmonary metastases. Recommend chest CT for staging, and rule out possibility of lung or breast primary. These results will be called to the ordering clinician or representative by the Radiologist Assistant, and communication documented in the PACS or zVision Dashboard. Electronically Signed   By: Marlaine Hind M.D.   On: 06/14/2019 16:28   CT BIOPSY  Result Date: 06/27/2019 INDICATION: Omental mass. EXAM: CT-DIRECTED BIOPSY MEDICATIONS: None. ANESTHESIA/SEDATION: Moderate (conscious) sedation was employed during this procedure. A total of Versed 3 mg and Fentanyl 50 mcg was administered intravenously. Moderate Sedation Time: 15 minutes. The patient's level of consciousness and vital signs were monitored continuously by radiology nursing throughout the procedure under my direct supervision. FLUOROSCOPY TIME:  Fluoroscopy Time: 0 minutes 0 seconds (0 mGy). COMPLICATIONS: None immediate. PROCEDURE: After discussing the risks and benefits of this procedure the patient informed consent was obtained. Rectal/colonic contrast administered. Abdomen was sterilely prepped and draped. Following local anesthesia with 1% lidocaine CT-directed omental biopsy was performed and 2 separate 23 mm 18 gauge core samples  obtained. There no complications. IMPRESSION: Successful CT-directed omental mass biopsy. Electronically Signed   By: Marcello Moores  Register   On: 06/27/2019 13:25    ASSESSMENT & PLAN:   Peritoneal carcinoma ([place]) #Stage IV -peritoneal carcinomatosis-omental caking/bilateral adnexal masses-high-grade serous carcinoma; GYN origin; multiple lung nodules; 3.8 x 2.5 cm mass adjacent to GE junction. CEA -elevated at 600+.  #Proceed with carbotaxol every 3 weeks; will reimage after 3 cycles.  Monitor-Ca1 2 5.   Again counseled the patient regarding the potential side effects of chemotherapy.  Understands treatments are likely palliative; not curative. Further surgical decisions to be based upon response to therapy.   #Mild skin rash in the abdomen-question etiology.  Recommend topical hydrocortisone cream.  # DISPOSITION: # Carbo-Taxol today; # 10 days- cbc/bmp # refer to IR re: port placement- in last week of dec.  # jan 4th-MD; labs- cbc/cmp; ca-125- Carbo-Taxol- Dr.B  # I reviewed the blood work- with the patient/daughter in detail-including additional findings-small pleural effusions in the right/ascites; also reviewed the imaging independently [as summarized above]; and with the patient in detail.    All questions were answered. The patient knows to call the clinic with any problems, questions or concerns.    Cammie Sickle, MD 06/30/2019 9:49 AM

## 2019-06-29 NOTE — Progress Notes (Signed)
     Tumor Board Documentation  Sakura Otey was presented by Mariea Clonts at our Tumor Board on 06/28/2019, which included representatives from medical oncology, radiation oncology, surgical oncology, pathology, navigation.  Toshie currently presents as a current patient, for [place], for new positive pathology with history of the following treatments: none.  Additionally, we reviewed previous medical and familial history, history of present illness, and recent lab results along with all available histopathologic and imaging studies. The tumor board considered available treatment options and made the following recommendations: Neoadjuvant chemotherapy Carbo/Taxol. Discussed role of Avastin. Likey stage IV high grade serous of gyn origin. Await final pathology.     The following procedures/referrals were also placed: No orders of the defined types were placed in this encounter.   Clinical Trial Status: unknown   Staging used: To be determined  National site-specific guidelines   were discussed with respect to the case.  Tumor board is a meeting of clinicians from various specialty areas who evaluate and discuss patients for whom a multidisciplinary approach is being considered. Final determinations in the plan of care are those of the provider(s). The responsibility for follow up of recommendations given during tumor board is that of the provider.   Today's extended care, comprehensive team conference, Galiana was not present for the discussion and was not examined.   Multidisciplinary Tumor Board is a multidisciplinary case peer review process.  Decisions discussed in the Multidisciplinary Tumor Board reflect the opinions of the specialists present at the conference without having examined the patient.  Ultimately, treatment and diagnostic decisions rest with the primary provider(s) and the patient.

## 2019-06-30 ENCOUNTER — Other Ambulatory Visit: Payer: Self-pay

## 2019-06-30 ENCOUNTER — Inpatient Hospital Stay (HOSPITAL_BASED_OUTPATIENT_CLINIC_OR_DEPARTMENT_OTHER): Payer: Self-pay | Admitting: Internal Medicine

## 2019-06-30 ENCOUNTER — Inpatient Hospital Stay: Payer: Self-pay

## 2019-06-30 VITALS — BP 93/60 | HR 84 | Temp 96.5°F | Resp 18

## 2019-06-30 DIAGNOSIS — C482 Malignant neoplasm of peritoneum, unspecified: Secondary | ICD-10-CM

## 2019-06-30 DIAGNOSIS — C561 Malignant neoplasm of right ovary: Secondary | ICD-10-CM

## 2019-06-30 DIAGNOSIS — C563 Malignant neoplasm of bilateral ovaries: Secondary | ICD-10-CM

## 2019-06-30 LAB — CBC WITH DIFFERENTIAL/PLATELET
Abs Immature Granulocytes: 0.04 10*3/uL (ref 0.00–0.07)
Basophils Absolute: 0 10*3/uL (ref 0.0–0.1)
Basophils Relative: 0 %
Eosinophils Absolute: 0.1 10*3/uL (ref 0.0–0.5)
Eosinophils Relative: 1 %
HCT: 40.4 % (ref 36.0–46.0)
Hemoglobin: 12.3 g/dL (ref 12.0–15.0)
Immature Granulocytes: 1 %
Lymphocytes Relative: 13 %
Lymphs Abs: 1 10*3/uL (ref 0.7–4.0)
MCH: 27.5 pg (ref 26.0–34.0)
MCHC: 30.4 g/dL (ref 30.0–36.0)
MCV: 90.4 fL (ref 80.0–100.0)
Monocytes Absolute: 0.6 10*3/uL (ref 0.1–1.0)
Monocytes Relative: 8 %
Neutro Abs: 6.1 10*3/uL (ref 1.7–7.7)
Neutrophils Relative %: 77 %
Platelets: 414 10*3/uL — ABNORMAL HIGH (ref 150–400)
RBC: 4.47 MIL/uL (ref 3.87–5.11)
RDW: 12.1 % (ref 11.5–15.5)
WBC: 7.9 10*3/uL (ref 4.0–10.5)
nRBC: 0 % (ref 0.0–0.2)

## 2019-06-30 LAB — COMPREHENSIVE METABOLIC PANEL
ALT: 23 U/L (ref 0–44)
AST: 31 U/L (ref 15–41)
Albumin: 2.8 g/dL — ABNORMAL LOW (ref 3.5–5.0)
Alkaline Phosphatase: 176 U/L — ABNORMAL HIGH (ref 38–126)
Anion gap: 12 (ref 5–15)
BUN: 14 mg/dL (ref 8–23)
CO2: 28 mmol/L (ref 22–32)
Calcium: 8.8 mg/dL — ABNORMAL LOW (ref 8.9–10.3)
Chloride: 99 mmol/L (ref 98–111)
Creatinine, Ser: 0.77 mg/dL (ref 0.44–1.00)
GFR calc Af Amer: >60 mL/min (ref >60)
GFR calc non Af Amer: >60 mL/min (ref >60)
Glucose, Bld: 110 mg/dL — ABNORMAL HIGH (ref 70–99)
Potassium: 4.2 mmol/L (ref 3.5–5.1)
Sodium: 139 mmol/L (ref 135–145)
Total Bilirubin: 0.5 mg/dL (ref 0.3–1.2)
Total Protein: 6.7 g/dL (ref 6.5–8.1)

## 2019-06-30 MED ORDER — FAMOTIDINE IN NACL 20-0.9 MG/50ML-% IV SOLN
20.0000 mg | Freq: Once | INTRAVENOUS | Status: AC
  Administered 2019-06-30: 10:00:00 20 mg via INTRAVENOUS
  Filled 2019-06-30: qty 50

## 2019-06-30 MED ORDER — PALONOSETRON HCL INJECTION 0.25 MG/5ML
0.2500 mg | Freq: Once | INTRAVENOUS | Status: AC
  Administered 2019-06-30: 0.25 mg via INTRAVENOUS
  Filled 2019-06-30: qty 5

## 2019-06-30 MED ORDER — SODIUM CHLORIDE 0.9 % IV SOLN: 10.0000 mg | Freq: Once | INTRAVENOUS | Status: DC

## 2019-06-30 MED ORDER — SODIUM CHLORIDE 0.9 % IV SOLN
Freq: Once | INTRAVENOUS | Status: AC
  Administered 2019-06-30: 10:00:00 via INTRAVENOUS
  Filled 2019-06-30: qty 5

## 2019-06-30 MED ORDER — SODIUM CHLORIDE 0.9 % IV SOLN
175.0000 mg/m2 | Freq: Once | INTRAVENOUS | Status: AC
  Administered 2019-06-30: 11:00:00 288 mg via INTRAVENOUS
  Filled 2019-06-30: qty 48

## 2019-06-30 MED ORDER — SODIUM CHLORIDE 0.9 % IV SOLN: 150.0000 mg | Freq: Once | INTRAVENOUS | Status: DC

## 2019-06-30 MED ORDER — SODIUM CHLORIDE 0.9 % IV SOLN
567.0000 mg | Freq: Once | INTRAVENOUS | Status: AC
  Administered 2019-06-30: 570 mg via INTRAVENOUS
  Filled 2019-06-30: qty 57

## 2019-06-30 MED ORDER — SODIUM CHLORIDE 0.9 % IV SOLN
Freq: Once | INTRAVENOUS | Status: AC
  Administered 2019-06-30: 09:00:00 via INTRAVENOUS
  Filled 2019-06-30: qty 250

## 2019-06-30 MED ORDER — DIPHENHYDRAMINE HCL 50 MG/ML IJ SOLN
50.0000 mg | Freq: Once | INTRAMUSCULAR | Status: AC
  Administered 2019-06-30: 10:00:00 50 mg via INTRAVENOUS
  Filled 2019-06-30: qty 1

## 2019-06-30 NOTE — Progress Notes (Signed)
HR 101, ok to proceed

## 2019-06-30 NOTE — Progress Notes (Signed)
1049: Pt reports burning sensation at left PIV site NS paused. Blood return noted, pt expressed severe discomfort with flushing and with touch ing above the IV site. No redness or swelling noted.  1050: IV de accessed and MD aware. Ice pack placed per Dr. Rogue Bussing.  1057: New PIV started in Rt FA and treatment resumed.  Per [name] NP pt to monitor site and call clinic/seek medical care if symptoms persist or worsen, pt to apply an ice pack 2-3 times daily for 20 minutes (ice not directly to skin, pt to ensure a barrier such as a washcloth is used). Pt aware and verbalizes understanding.  1545: Pt tolerated infusion well. Pt reports left forearm remains "tender" but better than it was initially. Pt denies any concerns or complaints at time of discharge. Pt to call clinic with any questions or concerns or to report to ER/call 911 in the case of an emergency. Pt verbalizes understanding. Pt stable at discharge.

## 2019-06-30 NOTE — Progress Notes (Signed)
1115: pt reports burning at rt PIV site. Taxol paused, PIV flushed without difficulty and pt denies pain with flushing. Blood return noted and no redness, pain or swelling noted. Will continue to monitor.

## 2019-06-30 NOTE — Assessment & Plan Note (Addendum)
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 CEA -elevated at 600+.  #Proceed with carbotaxol every 3 weeks; will reimage after 3 cycles.  Monitor-Ca1 2 5.   Again counseled the patient regarding the potential side effects of chemotherapy.  Understands treatments are likely palliative; not curative. Further surgical decisions to be based upon response to therapy.   #Mild skin rash in the abdomen-question etiology.  Recommend topical hydrocortisone cream.  # DISPOSITION: # Carbo-Taxol today; # 10 days- cbc/bmp # refer to IR re: port placement- in last week of dec.  # jan 4th-MD; labs- cbc/cmp; ca-125- Carbo-Taxol- Dr.B  # I reviewed the blood work- with the patient/daughter in detail-including additional findings-small pleural effusions in the right/ascites; also reviewed the imaging independently [as summarized above]; and with the patient in detail.

## 2019-06-30 NOTE — Progress Notes (Signed)
[place]  Telephone:([phone] [phone]  Patient Care Team: Plotnikov, [name], MD as PCP - General (Internal Medicine) Arvella Nigh, MD as Consulting Physician (Obstetrics and Gynecology) Clent Jacks, RN as Oncology Nurse Navigator   Name of the patient: Amanda Pena  [id number]  [birth year]   Date of visit: 06/30/19  Diagnosis-Ovarian Cancer/peritoneal carcinomatosis  Chief complaint/Reason for visit- Initial Meeting for [facility], preparing for starting chemotherapy  Heme/Onc history:  Oncology History Overview Note  #November 2020-omental caking/peritoneal carcinomatosis; bilateral adnexal masses 4-5 cm in size; multiple lung nodules;   # dec 8th 2020-omental biopsy; positive for high-grade serous adenocarcinoma; GYN origin.  CT scan chest-bilateral lung nodules; 3.8 cm soft tissue mass adjacent to GE junction  # DEC 11TH 2020-CARBO-Taxol [chemo consent]  # #Genetic counseling-referral.   # Thyroid cancer at 26y s/p thyroidectomy [no RAIU]  # NGS/MOLECULAR TESTS: P  # PALLIATIVE CARE EVALUATION:P  # PAIN MANAGEMENT: P   DIAGNOSIS: Bilateral ovarian cancer  STAGE: IV        ;  GOALS: Control  CURRENT/MOST RECENT THERAPY : Carbotaxol    Peritoneal carcinoma ([place])  06/23/2019 Initial Diagnosis   Peritoneal carcinoma ([place])   06/30/2019 -  Chemotherapy   The patient had palonosetron (ALOXI) injection 0.25 mg, 0.25 mg, Intravenous,  Once, 1 of 6 cycles CARBOplatin (PARAPLATIN) 570 mg in sodium chloride 0.9 % 250 mL chemo infusion, 570 mg (100 % of original dose 567 mg), Intravenous,  Once, 1 of 6 cycles Dose modification:   (original dose 567 mg, Cycle 1) fosaprepitant (EMEND) 150 mg in sodium chloride 0.9 % 145 mL IVPB, 150 mg, Intravenous,  Once, 1 of 6 cycles PACLitaxel (TAXOL) 288 mg in sodium chloride 0.9 % 250 mL chemo infusion (> 19m/m2), 175 mg/m2 = 288 mg, Intravenous,   Once, 1 of 6 cycles  for chemotherapy treatment.      Interval history-  Mrs. CMccaigis a 63year old female who presents to chemo care clinic today for initial meeting in preparation for starting chemotherapy. I introduced the chemo care clinic and we discussed that the role of the clinic is to assist those who are at an increased risk of emergency room visits and/or complications during the course of chemotherapy treatment. We discussed that the increased risk takes into account factors such as age, performance status, and co-morbidities. We also discussed that for some, this might include barriers to care such as not having a primary care provider, lack of insurance/transportation, or not being able to afford medications. We discussed that the goal of the program is to help prevent unplanned ER visits and help reduce complications during chemotherapy. We do this by discussing specific risk factors to each individual and identifying ways that we can help improve these risk factors and reduce barriers to care.   ECOG FS:0 - Asymptomatic  Review of systems- Review of Systems  Constitutional: Negative.  Negative for chills, fever, malaise/fatigue and weight loss.  HENT: Negative for congestion, ear pain and tinnitus.   Eyes: Negative.  Negative for blurred vision and double vision.  Respiratory: Negative.  Negative for cough, sputum production and shortness of breath.   Cardiovascular: Negative.  Negative for chest pain, palpitations and leg swelling.  Gastrointestinal: Positive for abdominal pain and constipation. Negative for diarrhea, nausea and vomiting.  Genitourinary: Negative for dysuria, frequency and urgency.  Musculoskeletal: Positive for back pain. Negative for falls.  Skin: Positive for rash.  Neurological: Positive for weakness. Negative for headaches.  Endo/Heme/Allergies: Negative.  Does not bruise/bleed easily.  Psychiatric/Behavioral: Negative.  Negative for depression. The  patient is not nervous/anxious and does not have insomnia.      Current treatment- Will begin Carbo/Taxol X 6 cycles on 06/30/19  No Known Allergies  Past Medical History:  Diagnosis Date  . Cancer ([place])   . HYPERLIPIDEMIA 04/09/2008   Qualifier: Diagnosis of  By: Wynona Luna   . HYPOTHYROIDISM 04/09/2008   Qualifier: Diagnosis of  By: Wynona Luna   . INSOMNIA, CHRONIC 08/21/2010   Qualifier: Diagnosis of  By: Wynona Luna   . 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 THYROIDECTOMY, PARTIAL      Social History   Socioeconomic History  . Marital status: Married    Spouse name: Not on file  . Number of children: Not on file  . Years of education: Not on file  . Highest education level: Not on file  Occupational History  . Not on file  Tobacco Use  . Smoking status: Never Smoker  . Smokeless tobacco: Never Used  Substance and Sexual Activity  . Alcohol use: Yes  . Drug use: No  . Sexual activity: Yes  Other Topics Concern  . 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 Difficulty of Paying Living Expenses: Not on file  Food Insecurity:   . Worried About Charity fundraiser in the Last Year: Not on file  . Ran Out of Food in the Last Year: Not on file  Transportation Needs:   . Lack of Transportation (Medical): Not on file  . Lack of Transportation (Non-Medical): Not on file  Physical Activity:   . Days of Exercise per Week: Not on file  . Minutes of Exercise per Session: Not on file  Stress:   . Feeling of Stress : Not on file  Social Connections:   . Frequency of Communication with Friends and Family: Not on file  . Frequency of  Social Gatherings with Friends and Family: Not on file  . Attends Religious Services: Not on file  . Active Member of Clubs or Organizations: Not on file  . Attends Archivist Meetings: Not on file  . Marital Status: Not on file  Intimate Partner Violence:   . Fear of Current or Ex-Partner: Not on file  . Emotionally Abused: Not on file  . Physically Abused: Not on file  . Sexually Abused: Not on file    Family History  Problem Relation Age of Onset  . Arthritis Mother   . Hyperlipidemia Mother   . Hyperlipidemia Father   . Diabetes Father   . Hypertension Father   . Heart disease Father 20       CAD  . Lung cancer Father   . Breast cancer Maternal Grandmother        in 68s     Current Outpatient Medications:  .  ALPRAZolam (NIRAVAM) 0.5 MG dissolvable tablet, Take 1 tablet (0.5 mg total) by mouth 3 (three) times daily as needed for anxiety., Disp: 30 tablet, Rfl: 1 .  Ascorbic Acid (VITAMIN  C) 1000 MG tablet, Take 1,000 mg by mouth daily., Disp: , Rfl:  .  HYDROcodone-acetaminophen (NORCO/VICODIN) 5-325 MG tablet, Take 1 tablet by mouth every 6 (six) hours as needed for severe pain., Disp: 45 tablet, Rfl: 0 .  Omega-3 Fatty Acids (FISH OIL) 1000 MG CAPS, Take 2 capsules by mouth daily., Disp: , Rfl:  .  ondansetron (ZOFRAN) 4 MG tablet, Take 1 tablet (4 mg total) by mouth every 8 (eight) hours as needed for nausea or vomiting. (Patient not taking: Reported on 06/20/2019), Disp: 30 tablet, Rfl: 1 .  ondansetron (ZOFRAN) 8 MG tablet, One pill every 8 hours as needed for nausea/vomitting. (Patient not taking: Reported on 06/29/2019), Disp: 40 tablet, Rfl: 1 .  Probiotic Product (PROBIOTIC-10) CHEW, Chew 1 capsule by mouth., Disp: , Rfl:  .  prochlorperazine (COMPAZINE) 10 MG tablet, Take 1 tablet (10 mg total) by mouth every 6 (six) hours as needed for nausea or vomiting. (Patient not taking: Reported on 06/29/2019), Disp: 40 tablet, Rfl: 1 .  SYNTHROID 75 MCG tablet, TAKE  1 TABLET (75 MCG TOTAL) BY MOUTH DAILY BEFORE BREAKFAST., Disp: 90 tablet, Rfl: 1 .  traMADol (ULTRAM) 50 MG tablet, Take 1 tablet (50 mg total) by mouth every 6 (six) hours as needed for moderate pain or severe pain. (Patient not taking: Reported on 06/29/2019), Disp: 30 tablet, Rfl: 0 .  vitamin E 1000 UNIT capsule, Take 4,000 Units by mouth daily., Disp: , Rfl:  .  zolpidem (AMBIEN) 10 MG tablet, TAKE 1 TABLET BY MOUTH EVERY DAY AT BEDTIME AS NEEDED FOR SLEEP (Patient not taking: Reported on 06/20/2019), Disp: 30 tablet, Rfl: 3 No current facility-administered medications for this visit.  Facility-Administered Medications Ordered in Other Visits:  .  CARBOplatin (PARAPLATIN) 570 mg in sodium chloride 0.9 % 250 mL chemo infusion, 570 mg, Intravenous, Once, Charlaine Dalton R, MD .  PACLitaxel (TAXOL) 288 mg in sodium chloride 0.9 % 250 mL chemo infusion (> 4m/m2), 175 mg/m2 (Treatment Plan Recorded), Intravenous, Once, BCammie Sickle MD  Physical exam: There were no vitals filed for this visit. Physical Exam Constitutional:      Appearance: Normal appearance.  HENT:     Head: Normocephalic and atraumatic.  Eyes:     Pupils: Pupils are equal, round, and reactive to light.  Cardiovascular:     Rate and Rhythm: Normal rate and regular rhythm.     Heart sounds: Normal heart sounds. No murmur.  Pulmonary:     Effort: Pulmonary effort is normal.     Breath sounds: Normal breath sounds. No wheezing.  Abdominal:     General: Bowel sounds are normal. There is distension.     Palpations: Abdomen is soft.     Tenderness: There is no abdominal tenderness.  Musculoskeletal:        General: Normal range of motion.     Cervical back: Normal range of motion.  Skin:    General: Skin is warm and dry.     Findings: Rash (On abdomen) present.  Neurological:     Mental Status: She is alert and oriented to person, place, and time.  Psychiatric:        Judgment: Judgment normal.       CMP Latest Ref Rng & Units 06/30/2019  Glucose 70 - 99 mg/dL 110(H)  BUN 8 - 23 mg/dL 14  Creatinine 0.44 - 1.00 mg/dL 0.77  Sodium 135 - 145 mmol/L 139  Potassium 3.5 - 5.1 mmol/L 4.2  Chloride 98 -  111 mmol/L 99  CO2 22 - 32 mmol/L 28  Calcium 8.9 - 10.3 mg/dL 8.8(L)  Total Protein 6.5 - 8.1 g/dL 6.7  Total Bilirubin 0.3 - 1.2 mg/dL 0.5  Alkaline Phos 38 - 126 U/L 176(H)  AST 15 - 41 U/L 31  ALT 0 - 44 U/L 23   CBC Latest Ref Rng & Units 06/30/2019  WBC 4.0 - 10.5 K/uL 7.9  Hemoglobin 12.0 - 15.0 g/dL 12.3  Hematocrit 36.0 - 46.0 % 40.4  Platelets 150 - 400 K/uL 414(H)    No images are attached to the encounter.  CT Chest W Contrast  Result Date: 06/27/2019 CLINICAL DATA:  63 y.o. female diagnosed with probable stage IV epithelial ovarian cancer with lung metastases Fully assess lung nodules, non smoker. Lung nodule, >=1cm Known bilateral adnexal masses; pulmonary nodules in RLL lung. Staging EXAM: CT CHEST WITH CONTRAST TECHNIQUE: Multidetector CT imaging of the chest was performed during intravenous contrast administration. CONTRAST:  37m OMNIPAQUE IOHEXOL 300 MG/ML  SOLN COMPARISON:  CT of the abdomen and pelvis on 06/14/2019 FINDINGS: Cardiovascular: Heart size is normal. There is atherosclerotic calcification of coronary vessels. No pericardial effusion. Mediastinum/Nodes: There is a soft tissue mass adjacent to the GE junction, measuring 3.8 x 2.2 centimeters. UPPER and mid esophagus are normal in appearance. Status post resection of the LEFT lobe of the thyroid gland. RIGHT lobe is unremarkable. No significant mediastinal, hilar, or axillary adenopathy. Lungs/Pleura: Multiple nodules are identified in the RIGHT LOWER lobe, largest measuring 1.2 x 0.8 centimeters on image 100 of series 3. There are at least 6 other nodules within the RIGHT LOWER lobe, measuring 9 millimeters and smaller. 3 millimeter nodule is identified in the posterior LEFT UPPER lobe on image 47 of series 3.  There is small RIGHT pleural effusion. Minimal subsegmental atelectasis at the RIGHT lung base. Upper Abdomen: GE junction mass, measured above. Ascites. Musculoskeletal: No suspicious lytic or blastic lesions are identified. IMPRESSION: 1. Multiple bilateral pulmonary nodules, suspicious for metastatic disease. Largest mass is mean of 1.0 centimeters in the RIGHT LOWER lobe. 2. Soft tissue mass adjacent to the gastroesophageal junction, suspicious for metastasis. 3. RIGHT pleural effusion and RIGHT basilar atelectasis. 4. Ascites. 5. Coronary artery disease. 6. Status post resection of the LEFT lobe of the thyroid gland. 7. Aortic Atherosclerosis (ICD10-I70.0). Electronically Signed   By: ENolon NationsM.D.   On: 06/27/2019 16:27   CT Abdomen Pelvis W Contrast  Result Date: 06/14/2019 CLINICAL DATA:  Severe left lower quadrant pain with abdominal distention/bloating. EXAM: CT ABDOMEN AND PELVIS WITH CONTRAST TECHNIQUE: Multidetector CT imaging of the abdomen and pelvis was performed using the standard protocol following bolus administration of intravenous contrast. CONTRAST:  775mOMNIPAQUE IOHEXOL 300 MG/ML  SOLN COMPARISON:  None. FINDINGS: Lower Chest: Multiple pulmonary nodules are seen in the visualized portion of the right lower lobe, largest measuring 12 mm. Hepatobiliary: No hepatic masses identified. Gallbladder is unremarkable. No evidence of biliary ductal dilatation. Pancreas:  No mass or inflammatory changes. Spleen: Within normal limits in size and appearance. Adrenals/Urinary Tract: No masses identified. Tiny right renal cyst noted. No evidence of hydronephrosis. Unremarkable unopacified urinary bladder. Stomach/Bowel: No evidence of acute inflammatory process or bowel obstruction. Abnormal nodular soft tissue density is seen in the right lower quadrant along the paracolic gutter in the expected location of the appendix. Vascular/Lymphatic: No pathologically enlarged lymph nodes. No abdominal  aortic aneurysm. Reproductive: Uterus is normal in size. Heterogeneous soft tissue masses are seen in  the adnexal regions bilaterally, measuring 4.3 x 3.6 cm on the left and 4.6 x 2.7 cm on the right. Diffuse omental soft tissue caking and diffuse peritoneal thickening are seen throughout the abdomen and pelvis as well as mild ascites. These findings are consistent with peritoneal carcinomatosis. Other:  None. Musculoskeletal:  No suspicious bone lesions identified. IMPRESSION: Diffuse peritoneal carcinomatosis and mild ascites. Differential diagnosis includes primary peritoneal carcinoma, and metastatic carcinoma from ovarian/fallopian tube, appendiceal, or extra-abdominal primaries. Multiple pulmonary nodules in visualized portion of right lung base, consistent with pulmonary metastases. Recommend chest CT for staging, and rule out possibility of lung or breast primary. These results will be called to the ordering clinician or representative by the Radiologist Assistant, and communication documented in the PACS or zVision Dashboard. Electronically Signed   By: Marlaine Hind M.D.   On: 06/14/2019 16:28   CT BIOPSY  Result Date: 06/27/2019 INDICATION: Omental mass. EXAM: CT-DIRECTED BIOPSY MEDICATIONS: None. ANESTHESIA/SEDATION: Moderate (conscious) sedation was employed during this procedure. A total of Versed 3 mg and Fentanyl 50 mcg was administered intravenously. Moderate Sedation Time: 15 minutes. The patient's level of consciousness and vital signs were monitored continuously by radiology nursing throughout the procedure under my direct supervision. FLUOROSCOPY TIME:  Fluoroscopy Time: 0 minutes 0 seconds (0 mGy). COMPLICATIONS: None immediate. PROCEDURE: After discussing the risks and benefits of this procedure the patient informed consent was obtained. Rectal/colonic contrast administered. Abdomen was sterilely prepped and draped. Following local anesthesia with 1% lidocaine CT-directed omental biopsy was  performed and 2 separate 23 mm 18 gauge core samples obtained. There no complications. IMPRESSION: Successful CT-directed omental mass biopsy. Electronically Signed   By: Marcello Moores  Register   On: 06/27/2019 13:25   Assessment and plan- Patient is a 63 y.o. female who presents to [facility] for initial meeting in preparation for starting chemotherapy for the treatment of peritoneal carcinomatosis likely ovarian cancer.    1. HPI: She initially presented back in November for severe lower quadrant pain with abdominal distention/bloating who was evaluated by her PCP who ordered a CT scan on 1126 which showed heterogeneous soft tissue masses, bilateral adnexa measuring 4.3 x 3.6 cm on left and 4.6 x 2.7 on right.  Diffuse omental soft tissue caking and diffuse peritoneal thickening seen throughout the abdomen and pelvis with mild ascites.  Findings were consistent with peritoneal carcinomatosis.  Multiple pulmonary nodules in right lower lobe measured 12 mm in size.  She was evaluated by Dr. Fransisca Connors in gynecology oncology on 06/21/2019 who recommended a CT chest for comparison and growth between surgery versus neoadjuvant chemotherapy.  He recommended chemotherapy with carbo/Taxol with repeat imaging to assess for debulking surgery.  She would then complete chemotherapy after surgery.  She would likely need genetic testing to test for BRCA 1/2 mutation or HRD that would suggest major benefit of maintenance part inhibitor.  Plan is to begin carbo/Taxol x3 cycles, debulking surgery then completion of 3 additional cycles of carbo/Taxol.  2. Chemo Care Clinic/High Risk for ER/Hospitalization during chemotherapy- We discussed the role of the chemo care clinic and identified patient specific risk factors. I discussed that patient was identified as high risk primarily based on stage of disease and lack of health insurance.  Patient states she has [employer] which is a [employer]-based health coverage that  requires itemized billing statements for repayment.  She is concerned about obtaining information needed for reimbursement.  Patient has past medical history positive for: Past Medical History:  Diagnosis Date  .  Cancer ([place])   . HYPERLIPIDEMIA 04/09/2008   Qualifier: Diagnosis of  By: Wynona Luna   . HYPOTHYROIDISM 04/09/2008   Qualifier: Diagnosis of  By: Wynona Luna   . INSOMNIA, CHRONIC 08/21/2010   Qualifier: Diagnosis of  By: Wynona Luna   . PERSONAL HISTORY MALIGNANT NEOPLASM THYROID 08/21/2010   Qualifier: Diagnosis of  By: Wynona Luna     Patient has past surgical history positive for: Past Surgical History:  Procedure Laterality Date  . THYROIDECTOMY, PARTIAL      Based on our high risk symptom management report; this patient has a high risk of ED utilization.  The percentage below indicates how "at risk "  this patient based on the factors in this table within one year.   1% Low Risk Risk of Admission or ED Visit  This score indicates an adult patient's 1-year risk, as a percentage, of a hospital admission or ED visit.    0%  1%   5 months ago 3 months ago 8 weeks ago Today  1% 12/31/18 2000   1% 06/30/19 1028    Factor Value  Current PCP Cassandria Anger, MD  Hospital Admissions 0  ED Visits 0  Has Medicaid No  Has Medicare No  In relationship Yes  Has Anemia No  Has asthma No  Has atrial fibrillation No  Has CVD No  Has chronic kidney disease No  Has Chronic Obstructive Pulmonary Disease No  Has Congestive Heart Failure No  Has Connective Tissue Disorder No  Has Depression No  Has Diabetes No  Has liver disease No  Has Peripheral Vascular Disease No    3. We discussed that social determinants of health may have significant impacts on health and outcomes for cancer patients.  Today we discussed specific social determinants of performance status, alcohol use, depression, financial needs, food insecurity, housing, interpersonal violence,  social connections, stress, tobacco use, and transportation.    After lengthy discussion the following were identified as areas of need: She would like to discuss further with [facility] L[facility] but also wishes to have a contact information for itemized billing.  Discussed with Orland Mustard, RN who will call patient and give information about billing.  She lives at home alone but lives nearby her daughter and son-in-law.  She has reliable transportation.  As mentioned before, she does not have health insurance but is covered under [employer] which is a [employer].  Requires specific itemized billing which seems to be a very big stressor at this time.  We discussed options including home based and outpatient services, DME, and CARE program. We discusssed that patients who participate in regular physical activity report fewer negative impacts of cancer and treatments and report less fatigue.  We discussed self-referral to [facility] for counseling services, psychiatry for medication management, or palliative care/symptom management as well as primary care providers.  We discussed that living with cancer can create tremendous financial burden.  We discussed options for assistance. I asked that if assistance is needed in affording medications or paying bills to please let us know so that we can provide assistance.  We discussed options for food including social services.  We will also notify [facility] L[facility] to see if cancer center can provide support.  Patient informed of food pantry at cancer center and was provided with care package today.  Please notify nursing if un-met needs. We discussed referral to social work. Will also  discuss with Elease Etienne to see if [facility] can provide support of utility bills, etc.  We discussed options for support groups at the cancer center. If interested, please notify nurse navigator to enroll.  We discussed options for managing  stress including healthy eating, exercise as well as participating in no charge counseling services at the cancer center and support groups.  If these are of interest, patient can notify either myself or primary nursing team. We discussed options for management including medications and referral to quit Smart program We discussed options for transportation including acta, paratransit, bus routes, link transit, taxi/uber/lyft, and cancer center van.  I have notified primary oncology team who will help assist with arranging Lucianne Lei transportation for appointments when needed. We also discussed options for transportation on short notice/acute visits.   5. Based on stage of cancer and/or identified needs today, I will refer patient to palliative care for goals of care and advanced care planning.  We also discussed the role of the Symptom Management Clinic at [facility] for acute issues and methods of contacting clinic/provider. She denies needing specific assistance at this time and She will be followed by Mariea Clonts, RN (Nurse Navigator).   Visit Diagnosis 1. Ovarian cancer, bilateral ([place])     Patient expressed understanding and was in agreement with this plan. She also understands that She can call clinic at any time with any questions, concerns, or complaints.   A total of (25) minutes of face-to-face time was spent with this patient with greater than 50% of that time in counseling and care-coordination.  [facility] [place]  CC: Dr. Rogue Bussing

## 2019-07-01 ENCOUNTER — Telehealth: Payer: Self-pay | Admitting: Oncology

## 2019-07-01 ENCOUNTER — Other Ambulatory Visit: Payer: Self-pay | Admitting: Oncology

## 2019-07-01 MED ORDER — PROMETHAZINE HCL 25 MG PO TABS: 25.0000 mg | ORAL_TABLET | Freq: Four times a day (QID) | ORAL | 0 refills | Status: DC | PRN

## 2019-07-01 NOTE — Telephone Encounter (Signed)
Pt reports having nausea this morning. She tried both Zofran and Compazine with out relief. Still nausea. No Vomiting. Phenergan 25mg  Q6h Rx sent to pharmacy. Patient will pick up med and try.

## 2019-07-03 ENCOUNTER — Other Ambulatory Visit: Payer: Self-pay

## 2019-07-03 ENCOUNTER — Other Ambulatory Visit: Payer: Self-pay | Admitting: *Deleted

## 2019-07-03 ENCOUNTER — Inpatient Hospital Stay (HOSPITAL_BASED_OUTPATIENT_CLINIC_OR_DEPARTMENT_OTHER): Payer: Self-pay | Admitting: Nurse Practitioner

## 2019-07-03 ENCOUNTER — Inpatient Hospital Stay: Payer: Self-pay

## 2019-07-03 ENCOUNTER — Telehealth: Payer: Self-pay | Admitting: *Deleted

## 2019-07-03 VITALS — BP 111/63 | HR 103 | Temp 98.1°F

## 2019-07-03 DIAGNOSIS — T451X5A Adverse effect of antineoplastic and immunosuppressive drugs, initial encounter: Secondary | ICD-10-CM

## 2019-07-03 DIAGNOSIS — M898X9 Other specified disorders of bone, unspecified site: Secondary | ICD-10-CM

## 2019-07-03 DIAGNOSIS — Z5189 Encounter for other specified aftercare: Secondary | ICD-10-CM

## 2019-07-03 DIAGNOSIS — R319 Hematuria, unspecified: Secondary | ICD-10-CM

## 2019-07-03 DIAGNOSIS — R110 Nausea: Secondary | ICD-10-CM

## 2019-07-03 DIAGNOSIS — C482 Malignant neoplasm of peritoneum, unspecified: Secondary | ICD-10-CM

## 2019-07-03 DIAGNOSIS — R531 Weakness: Secondary | ICD-10-CM

## 2019-07-03 DIAGNOSIS — C561 Malignant neoplasm of right ovary: Secondary | ICD-10-CM

## 2019-07-03 DIAGNOSIS — R3915 Urgency of urination: Secondary | ICD-10-CM

## 2019-07-03 DIAGNOSIS — R634 Abnormal weight loss: Secondary | ICD-10-CM

## 2019-07-03 DIAGNOSIS — K521 Toxic gastroenteritis and colitis: Secondary | ICD-10-CM

## 2019-07-03 LAB — COMPREHENSIVE METABOLIC PANEL
ALT: 43 U/L (ref 0–44)
AST: 48 U/L — ABNORMAL HIGH (ref 15–41)
Albumin: 2.9 g/dL — ABNORMAL LOW (ref 3.5–5.0)
Alkaline Phosphatase: 212 U/L — ABNORMAL HIGH (ref 38–126)
Anion gap: 10 (ref 5–15)
BUN: 13 mg/dL (ref 8–23)
CO2: 28 mmol/L (ref 22–32)
Calcium: 8.7 mg/dL — ABNORMAL LOW (ref 8.9–10.3)
Chloride: 98 mmol/L (ref 98–111)
Creatinine, Ser: 0.67 mg/dL (ref 0.44–1.00)
GFR calc Af Amer: >60 mL/min (ref >60)
GFR calc non Af Amer: >60 mL/min (ref >60)
Glucose, Bld: 116 mg/dL — ABNORMAL HIGH (ref 70–99)
Potassium: 4.1 mmol/L (ref 3.5–5.1)
Sodium: 136 mmol/L (ref 135–145)
Total Bilirubin: 0.5 mg/dL (ref 0.3–1.2)
Total Protein: 6.9 g/dL (ref 6.5–8.1)

## 2019-07-03 LAB — CBC WITH DIFFERENTIAL/PLATELET
Abs Immature Granulocytes: 0.02 10*3/uL (ref 0.00–0.07)
Basophils Absolute: 0 10*3/uL (ref 0.0–0.1)
Basophils Relative: 0 %
Eosinophils Absolute: 0.1 10*3/uL (ref 0.0–0.5)
Eosinophils Relative: 1 %
HCT: 40.1 % (ref 36.0–46.0)
Hemoglobin: 12.5 g/dL (ref 12.0–15.0)
Immature Granulocytes: 0 %
Lymphocytes Relative: 12 %
Lymphs Abs: 0.9 10*3/uL (ref 0.7–4.0)
MCH: 27.8 pg (ref 26.0–34.0)
MCHC: 31.2 g/dL (ref 30.0–36.0)
MCV: 89.1 fL (ref 80.0–100.0)
Monocytes Absolute: 0.1 10*3/uL (ref 0.1–1.0)
Monocytes Relative: 1 %
Neutro Abs: 6.5 10*3/uL (ref 1.7–7.7)
Neutrophils Relative %: 86 %
Platelets: 432 10*3/uL — ABNORMAL HIGH (ref 150–400)
RBC: 4.5 MIL/uL (ref 3.87–5.11)
RDW: 12.3 % (ref 11.5–15.5)
WBC: 7.6 10*3/uL (ref 4.0–10.5)
nRBC: 0 % (ref 0.0–0.2)

## 2019-07-03 LAB — URINALYSIS, COMPLETE (UACMP) WITH MICROSCOPIC
Bilirubin Urine: NEGATIVE
Glucose, UA: NEGATIVE mg/dL
Ketones, ur: NEGATIVE mg/dL
Nitrite: NEGATIVE
Protein, ur: NEGATIVE mg/dL
Specific Gravity, Urine: 1.009 (ref 1.005–1.030)
pH: 6 (ref 5.0–8.0)

## 2019-07-03 LAB — MAGNESIUM: Magnesium: 2.1 mg/dL (ref 1.7–2.4)

## 2019-07-03 MED ORDER — ONDANSETRON HCL 4 MG/2ML IJ SOLN
4.0000 mg | Freq: Once | INTRAMUSCULAR | Status: AC
  Administered 2019-07-03: 4 mg via INTRAVENOUS
  Filled 2019-07-03: qty 2

## 2019-07-03 MED ORDER — DEXAMETHASONE SODIUM PHOSPHATE 10 MG/ML IJ SOLN
10.0000 mg | Freq: Once | INTRAMUSCULAR | Status: AC
  Administered 2019-07-03: 13:00:00 10 mg via INTRAVENOUS
  Filled 2019-07-03: qty 1

## 2019-07-03 MED ORDER — SODIUM CHLORIDE 0.9 % IV SOLN
Freq: Once | INTRAVENOUS | Status: AC
  Administered 2019-07-03: 12:00:00 via INTRAVENOUS
  Filled 2019-07-03: qty 250

## 2019-07-03 NOTE — Telephone Encounter (Signed)
Please schedule patient for Symptom Management with labs and fluids please.

## 2019-07-03 NOTE — Telephone Encounter (Addendum)
Patient called reporting that she had a terrible weekend with symptoms of nausea, which has improved with medications, and then Saturday she had 10 large BM which were not diarrhea, but just a lot of stool so she took an Imodium , she only had 1 BM yesterday and none today. This morning she is aching in her hips and back and states she just feels bad. Denies fever.Please advise

## 2019-07-03 NOTE — Progress Notes (Signed)
[facility]  [phone] [phone]  Patient Care Team: Plotnikov, [name], MD as PCP - General (Internal Medicine) Arvella Nigh, MD as Consulting Physician (Obstetrics and Gynecology) Clent Jacks, RN as Oncology Nurse Navigator   Name of the patient: Amanda Pena  [id number]  06-Mar-1956   Date of visit: 07/03/19  Diagnosis- Ovarian Cancer  Chief complaint/ Reason for visit- Nausea, Pain, Malaise, Increased Stool Burden  Heme/Onc history:  Oncology History Overview Note  #November 2020-omental caking/peritoneal carcinomatosis; bilateral adnexal masses 4-5 cm in size; multiple lung nodules;   # dec 8th 2020-omental biopsy; positive for high-grade serous adenocarcinoma; GYN origin.  CT scan chest-bilateral lung nodules; 3.8 cm soft tissue mass adjacent to GE junction  # DEC 11TH 2020-CARBO-Taxol [chemo consent]  # #Genetic counseling-referral.   # Thyroid cancer at 26y s/p thyroidectomy [no RAIU]  # NGS/MOLECULAR TESTS: P  # PALLIATIVE CARE EVALUATION:P  # PAIN MANAGEMENT: P   DIAGNOSIS: Bilateral ovarian cancer  STAGE: IV        ;  GOALS: Control  CURRENT/MOST RECENT THERAPY : Carbotaxol    Peritoneal carcinoma ([place])  06/23/2019 Initial Diagnosis   Peritoneal carcinoma ([place])   06/30/2019 -  Chemotherapy   The patient had palonosetron (ALOXI) injection 0.25 mg, 0.25 mg, Intravenous,  Once, 1 of 6 cycles Administration: 0.25 mg (06/30/2019) CARBOplatin (PARAPLATIN) 570 mg in sodium chloride 0.9 % 250 mL chemo infusion, 570 mg (100 % of original dose 567 mg), Intravenous,  Once, 1 of 6 cycles Dose modification:   (original dose 567 mg, Cycle 1) Administration: 570 mg (06/30/2019) fosaprepitant (EMEND) 150 mg in sodium chloride 0.9 % 145 mL IVPB, 150 mg, Intravenous,  Once, 1 of 6 cycles PACLitaxel (TAXOL) 288 mg in sodium chloride 0.9 % 250 mL chemo infusion (> 65m/m2), 175 mg/m2 = 288  mg, Intravenous,  Once, 1 of 6 cycles Administration: 288 mg (06/30/2019)  for chemotherapy treatment.      Interval history- Amanda Pena 63year female recently diagnosed with metastatic ovarian cancer, now s/p cycle 1 of carbo-taxol neoadjuvant chemotherapy, who [facility] for complaints of nausea, bone pain, and increased stool burden. Symptoms are new and started 2 days ago and have persisted since that time. She describes having a large stool burden, soft but not watery, on Saturday. Also had very large bm on Sunday which prompted her to take one tablet of Imodium. No bm's since that time. She has suffered nausea, no vomiting as well which did not resolve with zofran and compazine. She spoke to on call physician who prescribed phenergan which has controlled nausea. Didn't take any today. Took xanax this morning for anxiety. Denies any neurologic complaints. Denies recent fevers or illnesses. Denies any easy bleeding or bruising. Reports good appetite and denies weight loss. Denies chest pain. Denies any vomiting, constipation, or diarrhea. Denies urinary complaints. Patient offers no further specific complaints today.  ECOG FS:2 - Symptomatic, <50% confined to bed  Review of systems- Review of Systems  Constitutional: Positive for malaise/fatigue and weight loss. Negative for chills and fever.  HENT: Negative for hearing loss, nosebleeds, sore throat and tinnitus.   Eyes: Negative for blurred vision and double vision.  Respiratory: Negative for cough, hemoptysis, shortness of breath and wheezing.   Cardiovascular: Negative for chest pain, palpitations and leg swelling.  Gastrointestinal: Negative for abdominal pain, blood in stool, constipation, diarrhea, melena, nausea and vomiting.  Genitourinary: Negative for dysuria and  urgency.  Musculoskeletal: Positive for myalgias. Negative for back pain, falls and joint pain.  Skin: Negative for itching and rash.    Neurological: Negative for dizziness, tingling, sensory change, loss of consciousness, weakness and headaches.  Endo/Heme/Allergies: Negative for environmental allergies. Does not bruise/bleed easily.  Psychiatric/Behavioral: Negative for depression. The patient is not nervous/anxious and does not have insomnia.      Current treatment-carbo-Taxol  No Known Allergies  Past Medical History:  Diagnosis Date  . Cancer ([place])   . HYPERLIPIDEMIA 04/09/2008   Qualifier: Diagnosis of  By: Wynona Luna   . HYPOTHYROIDISM 04/09/2008   Qualifier: Diagnosis of  By: Wynona Luna   . INSOMNIA, CHRONIC 08/21/2010   Qualifier: Diagnosis of  By: Wynona Luna   . 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 THYROIDECTOMY, PARTIAL      Social History   Socioeconomic History  . Marital status: Married    Spouse name: Not on file  . Number of children: Not on file  . Years of education: Not on file  . Highest education level: Not on file  Occupational History  . Not on file  Tobacco Use  . Smoking status: Never Smoker  . Smokeless tobacco: Never Used  Substance and Sexual Activity  . Alcohol use: Yes  . Drug use: No  . Sexual activity: Yes  Other Topics Concern  . 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 Difficulty of Paying Living Expenses: Not on file  Food Insecurity:   . Worried About Charity fundraiser in the Last Year: Not on file  . Ran Out of Food in the Last Year: Not on file  Transportation Needs:   . Lack of Transportation (Medical): Not on file  . Lack of Transportation (Non-Medical): Not on file  Physical Activity:   . Days of Exercise per  Week: Not on file  . Minutes of Exercise per Session: Not on file  Stress:   . Feeling of Stress : Not on file  Social Connections:   . Frequency of Communication with Friends and Family: Not on file  . Frequency of Social Gatherings with Friends and Family: Not on file  . Attends Religious Services: Not on file  . Active Member of Clubs or Organizations: Not on file  . Attends Archivist Meetings: Not on file  . Marital Status: Not on file  Intimate Partner Violence:   . Fear of Current or Ex-Partner: Not on file  . Emotionally Abused: Not on file  . Physically Abused: Not on file  . Sexually Abused: Not on file    Family History  Problem Relation Age of Onset  . Arthritis Mother   . Hyperlipidemia Mother   . Hyperlipidemia Father   . Diabetes Father   . Hypertension Father   . Heart disease Father 33       CAD  . Lung cancer Father   . Breast cancer Maternal Grandmother        in 15s     Current Outpatient Medications:  .  ALPRAZolam (NIRAVAM) 0.5 MG dissolvable tablet, Take 1 tablet (0.5 mg  total) by mouth 3 (three) times daily as needed for anxiety., Disp: 30 tablet, Rfl: 1 .  Ascorbic Acid (VITAMIN C) 1000 MG tablet, Take 1,000 mg by mouth daily., Disp: , Rfl:  .  HYDROcodone-acetaminophen (NORCO/VICODIN) 5-325 MG tablet, Take 1 tablet by mouth every 6 (six) hours as needed for severe pain., Disp: 45 tablet, Rfl: 0 .  Omega-3 Fatty Acids (FISH OIL) 1000 MG CAPS, Take 2 capsules by mouth daily., Disp: , Rfl:  .  ondansetron (ZOFRAN) 8 MG tablet, One pill every 8 hours as needed for nausea/vomitting., Disp: 40 tablet, Rfl: 1 .  Probiotic Product (PROBIOTIC-10) CHEW, Chew 1 capsule by mouth., Disp: , Rfl:  .  prochlorperazine (COMPAZINE) 10 MG tablet, Take 1 tablet (10 mg total) by mouth every 6 (six) hours as needed for nausea or vomiting., Disp: 40 tablet, Rfl: 1 .  promethazine (PHENERGAN) 25 MG tablet, Take 1 tablet (25 mg total) by mouth every 6 (six) hours  as needed for nausea or vomiting., Disp: 30 tablet, Rfl: 0 .  SYNTHROID 75 MCG tablet, TAKE 1 TABLET (75 MCG TOTAL) BY MOUTH DAILY BEFORE BREAKFAST., Disp: 90 tablet, Rfl: 1 .  vitamin E 1000 UNIT capsule, Take 4,000 Units by mouth daily., Disp: , Rfl:  .  traMADol (ULTRAM) 50 MG tablet, Take 1 tablet (50 mg total) by mouth every 6 (six) hours as needed for moderate pain or severe pain. (Patient not taking: Reported on 06/29/2019), Disp: 30 tablet, Rfl: 0 .  zolpidem (AMBIEN) 10 MG tablet, TAKE 1 TABLET BY MOUTH EVERY DAY AT BEDTIME AS NEEDED FOR SLEEP (Patient not taking: Reported on 06/20/2019), Disp: 30 tablet, Rfl: 3  Physical exam:  Vitals:   07/03/19 1143  BP: 111/63  Pulse: (!) 103  Temp: 98.1 F (36.7 C)  TempSrc: Tympanic   Physical Exam Constitutional:      General: She is not in acute distress.    Appearance: She is well-developed.     Comments: Accompanied by husband. Wearing mask.   HENT:     Head: Atraumatic.     Nose: Nose normal.     Mouth/Throat:     Pharynx: No oropharyngeal exudate.  Eyes:     General: No scleral icterus.    Conjunctiva/sclera: Conjunctivae normal.  Cardiovascular:     Rate and Rhythm: Normal rate and regular rhythm.  Pulmonary:     Effort: Pulmonary effort is normal. No respiratory distress.  Abdominal:     General: Bowel sounds are normal. There is no distension.     Palpations: Abdomen is soft.  Musculoskeletal:        General: Normal range of motion.  Skin:    General: Skin is warm and dry.  Neurological:     Mental Status: She is alert and oriented to person, place, and time.     Gait: Gait normal.  Psychiatric:        Mood and Affect: Mood normal.        Behavior: Behavior normal.      CMP Latest Ref Rng & Units 07/03/2019  Glucose 70 - 99 mg/dL 116(H)  BUN 8 - 23 mg/dL 13  Creatinine 0.44 - 1.00 mg/dL 0.67  Sodium 135 - 145 mmol/L 136  Potassium 3.5 - 5.1 mmol/L 4.1  Chloride 98 - 111 mmol/L 98  CO2 22 - 32 mmol/L 28    Calcium 8.9 - 10.3 mg/dL 8.7(L)  Total Protein 6.5 - 8.1 g/dL 6.9  Total Bilirubin 0.3 - 1.2 mg/dL  0.5  Alkaline Phos 38 - 126 U/L 212(H)  AST 15 - 41 U/L 48(H)  ALT 0 - 44 U/L 43   CBC Latest Ref Rng & Units 07/03/2019  WBC 4.0 - 10.5 K/uL 7.6  Hemoglobin 12.0 - 15.0 g/dL 12.5  Hematocrit 36.0 - 46.0 % 40.1  Platelets 150 - 400 K/uL 432(H)    No images are attached to the encounter.  CT Chest W Contrast  Result Date: 06/27/2019 CLINICAL DATA:  63 y.o. female diagnosed with probable stage IV epithelial ovarian cancer with lung metastases Fully assess lung nodules, non smoker. Lung nodule, >=1cm Known bilateral adnexal masses; pulmonary nodules in RLL lung. Staging EXAM: CT CHEST WITH CONTRAST TECHNIQUE: Multidetector CT imaging of the chest was performed during intravenous contrast administration. CONTRAST:  58m OMNIPAQUE IOHEXOL 300 MG/ML  SOLN COMPARISON:  CT of the abdomen and pelvis on 06/14/2019 FINDINGS: Cardiovascular: Heart size is normal. There is atherosclerotic calcification of coronary vessels. No pericardial effusion. Mediastinum/Nodes: There is a soft tissue mass adjacent to the GE junction, measuring 3.8 x 2.2 centimeters. UPPER and mid esophagus are normal in appearance. Status post resection of the LEFT lobe of the thyroid gland. RIGHT lobe is unremarkable. No significant mediastinal, hilar, or axillary adenopathy. Lungs/Pleura: Multiple nodules are identified in the RIGHT LOWER lobe, largest measuring 1.2 x 0.8 centimeters on image 100 of series 3. There are at least 6 other nodules within the RIGHT LOWER lobe, measuring 9 millimeters and smaller. 3 millimeter nodule is identified in the posterior LEFT UPPER lobe on image 47 of series 3. There is small RIGHT pleural effusion. Minimal subsegmental atelectasis at the RIGHT lung base. Upper Abdomen: GE junction mass, measured above. Ascites. Musculoskeletal: No suspicious lytic or blastic lesions are identified. IMPRESSION: 1.  Multiple bilateral pulmonary nodules, suspicious for metastatic disease. Largest mass is mean of 1.0 centimeters in the RIGHT LOWER lobe. 2. Soft tissue mass adjacent to the gastroesophageal junction, suspicious for metastasis. 3. RIGHT pleural effusion and RIGHT basilar atelectasis. 4. Ascites. 5. Coronary artery disease. 6. Status post resection of the LEFT lobe of the thyroid gland. 7. Aortic Atherosclerosis (ICD10-I70.0). Electronically Signed   By: ENolon NationsM.D.   On: 06/27/2019 16:27   CT Abdomen Pelvis W Contrast  Result Date: 06/14/2019 CLINICAL DATA:  Severe left lower quadrant pain with abdominal distention/bloating. EXAM: CT ABDOMEN AND PELVIS WITH CONTRAST TECHNIQUE: Multidetector CT imaging of the abdomen and pelvis was performed using the standard protocol following bolus administration of intravenous contrast. CONTRAST:  741mOMNIPAQUE IOHEXOL 300 MG/ML  SOLN COMPARISON:  None. FINDINGS: Lower Chest: Multiple pulmonary nodules are seen in the visualized portion of the right lower lobe, largest measuring 12 mm. Hepatobiliary: No hepatic masses identified. Gallbladder is unremarkable. No evidence of biliary ductal dilatation. Pancreas:  No mass or inflammatory changes. Spleen: Within normal limits in size and appearance. Adrenals/Urinary Tract: No masses identified. Tiny right renal cyst noted. No evidence of hydronephrosis. Unremarkable unopacified urinary bladder. Stomach/Bowel: No evidence of acute inflammatory process or bowel obstruction. Abnormal nodular soft tissue density is seen in the right lower quadrant along the paracolic gutter in the expected location of the appendix. Vascular/Lymphatic: No pathologically enlarged lymph nodes. No abdominal aortic aneurysm. Reproductive: Uterus is normal in size. Heterogeneous soft tissue masses are seen in the adnexal regions bilaterally, measuring 4.3 x 3.6 cm on the left and 4.6 x 2.7 cm on the right. Diffuse omental soft tissue caking and  diffuse peritoneal thickening are seen throughout  the abdomen and pelvis as well as mild ascites. These findings are consistent with peritoneal carcinomatosis. Other:  None. Musculoskeletal:  No suspicious bone lesions identified. IMPRESSION: Diffuse peritoneal carcinomatosis and mild ascites. Differential diagnosis includes primary peritoneal carcinoma, and metastatic carcinoma from ovarian/fallopian tube, appendiceal, or extra-abdominal primaries. Multiple pulmonary nodules in visualized portion of right lung base, consistent with pulmonary metastases. Recommend chest CT for staging, and rule out possibility of lung or breast primary. These results will be called to the ordering clinician or representative by the Radiologist Assistant, and communication documented in the PACS or zVision Dashboard. Electronically Signed   By: Marlaine Hind M.D.   On: 06/14/2019 16:28   CT BIOPSY  Result Date: 06/27/2019 INDICATION: Omental mass. EXAM: CT-DIRECTED BIOPSY MEDICATIONS: None. ANESTHESIA/SEDATION: Moderate (conscious) sedation was employed during this procedure. A total of Versed 3 mg and Fentanyl 50 mcg was administered intravenously. Moderate Sedation Time: 15 minutes. The patient's level of consciousness and vital signs were monitored continuously by radiology nursing throughout the procedure under my direct supervision. FLUOROSCOPY TIME:  Fluoroscopy Time: 0 minutes 0 seconds (0 mGy). COMPLICATIONS: None immediate. PROCEDURE: After discussing the risks and benefits of this procedure the patient informed consent was obtained. Rectal/colonic contrast administered. Abdomen was sterilely prepped and draped. Following local anesthesia with 1% lidocaine CT-directed omental biopsy was performed and 2 separate 23 mm 18 gauge core samples obtained. There no complications. IMPRESSION: Successful CT-directed omental mass biopsy. Electronically Signed   By: Marcello Moores  Register   On: 06/27/2019 13:25    Assessment and plan-  Patient is a 63 y.o. female newly diagnosed with metastatic peritoneal carcinomatosis with omental caking and bilateral adnexal masses, pathology shows high-grade serous carcinoma of GYN origin who presents to symptom management clinic for complaints of nausea and constipation with diarrhea following chemotherapy.  Labs today in clinic were reviewed and discussed with patient in detail.  1.  Chemotherapy-induced nausea-IV fluids, Decadron and Zofran given in clinic with improvement in symptoms today. Anxiety may also be contributing.  Consider Ativan for anxiety and nausea if persistent symptoms.  2.  Chemotherapy-induced diarrhea-no watery or loose stools but large stool burden.  Likely related to chemo.  May progress to diarrhea.  Hold stool softeners if loose or watery stools. We discussed the role of soluble fiber in diarrhea. Soluble fiber helps to absorb water and add bulk to stools. Soluble fiber becomes 'sticky' when it gets wet and sources such as oats, bran, barley, no sugar added applesauce, lentils, pears, white rice. We discussed that foods rich in insoluble fiber may make diarrhea worse. If symptoms worsen, consider imodium with maximum of 16 mg/24 hour period.   2.  Urinary urgency-UA today negative for infection.  Suspect secondary to pelvic pressure related to malignancy.  3.  Port-A-Cath-patient wishes to proceed with port placement.  I contacted Mariea Clonts who will coordinate request to IR.  Reviewed risks versus benefits of Port-A-Cath today.  4.  Hip/bone pain-suspect related to treatment but will check bone scan to evaluate for metastatic disease in setting of elevated alk phos.  Bone scan was negative for osseous metastatic disease.  5.  Poor oral intake-weight loss.  Suspect anxiety and above symptoms contributing.  Encourage small calorie dense meals.  Consider referral to RD if weight loss persists.  Disposition: Bone scan Follow-up with Dr.Brahmanday as  scheduled Return to clinic sooner if symptoms do not improve or worsen   Visit Diagnosis 1. Chemotherapy-induced nausea   2. Bone pain  3. Peritoneal carcinoma ([place])   4. Chemotherapy induced diarrhea   5. Urinary urgency   6. Weight loss, unintentional     Patient expressed understanding and was in agreement with this plan. She also understands that She can call clinic at any time with any questions, concerns, or complaints.   Thank you for allowing me to participate in the care of this very pleasant patient.   Beckey Rutter, DNP, AGNP-C [facility] [place]

## 2019-07-03 NOTE — Telephone Encounter (Signed)
Patient accepts appointment for 11 AM today for lab/ [facility]/ IVF

## 2019-07-04 ENCOUNTER — Telehealth: Payer: Self-pay

## 2019-07-04 ENCOUNTER — Other Ambulatory Visit: Payer: Self-pay | Admitting: *Deleted

## 2019-07-04 ENCOUNTER — Other Ambulatory Visit: Payer: Self-pay | Admitting: Internal Medicine

## 2019-07-04 ENCOUNTER — Telehealth: Payer: Self-pay | Admitting: *Deleted

## 2019-07-04 ENCOUNTER — Encounter: Payer: Self-pay | Admitting: Nurse Practitioner

## 2019-07-04 DIAGNOSIS — C482 Malignant neoplasm of peritoneum, unspecified: Secondary | ICD-10-CM

## 2019-07-04 LAB — URINE CULTURE: Culture: <10000 — AB

## 2019-07-04 NOTE — Telephone Encounter (Signed)
Telephone call to patient for follow up after first chemo.  Patient states has had a "rough 5 days" but is feeling better now.   States has not have to take pain pills today and only one yesterday.   States is drinking fluids and trying to get bowels moving again.  Drinking prune juice and taking Miralax.  Encouraged patient to call for any questions or concerns.

## 2019-07-04 NOTE — Telephone Encounter (Signed)
Called patient and discussed results as well as results of culture. UA not consistent with UTI. Suspect blood contamination from vaginal bleeding. Culture < 10,000 colonies, insignifcant growth. Nausea, stool, and myalgias improved today.

## 2019-07-04 NOTE — Telephone Encounter (Signed)
Patient returned my phone. Her nausea has resolved with antiemetics. She is now c/o constipation. Encouraged patient to continue to hydrate. Pt continues to report fatigue. Pt aware urinalysis did not demonstrate any signs of UTI.

## 2019-07-04 NOTE — Telephone Encounter (Signed)
Left vm for patient to return my phone call and to f/u on symptoms. No answer. Left msg. Will also send mychart msg to patient.  Per Ander Purpura, NP  No evidence of infection. suspect blood is contamination as patient was having vaginal bleeding and no rbcs on microscopy.  NP is ordering a bone scan to evaluate the hip pain. No evidence of UTI. She was having nausea and diarrhea. could you see if those are better and if she's taking meds?

## 2019-07-04 NOTE — Telephone Encounter (Signed)
Patient called stating she thinks she may have a UTI after seeing her urinalysis report yesterday, but she states she is not sure how to read it. Please advise, the culture is pending.  type  Ref Range & Units 1 d ago  Color, Urine YELLOW YELLOWAbnormal    APPearance CLEAR CLEARAbnormal    Specific Gravity, Urine 1.005 - 1.030 1.009   pH 5.0 - 8.0 6.0   Glucose, UA NEGATIVE mg/dL NEGATIVE   Hgb urine dipstick NEGATIVE MODERATEAbnormal    Bilirubin Urine NEGATIVE NEGATIVE   Ketones, ur NEGATIVE mg/dL NEGATIVE   Protein, ur NEGATIVE mg/dL NEGATIVE   Nitrite NEGATIVE NEGATIVE   Leukocytes,Ua NEGATIVE TRACEAbnormal    RBC / HPF 0 - 5 RBC/hpf 0-5   WBC, UA 0 - 5 WBC/hpf 0-5   Bacteria, UA NONE SEEN RAREAbnormal    Squamous Epithelial / LPF 0 - 5 0-5   Mucus  PRESENT   Comment: Performed at [facility], [address] [place], [place] [address]  Resulting Agency  [facility]      Specimen Collected: 07/03/19 15:51  Last Resulted: 07/03/19 16:35

## 2019-07-04 NOTE — Telephone Encounter (Signed)
Lauren, please advise

## 2019-07-04 NOTE — Telephone Encounter (Signed)
Called and advised Amanda Pena that we have sent request to IR to have port placed at the end of December, prior to her next treatment per Dr. Aletha Halim note.

## 2019-07-06 ENCOUNTER — Encounter
Admission: RE | Admit: 2019-07-06 | Discharge: 2019-07-06 | Disposition: A | Discharge status: Home / Self Care | Payer: Self-pay | Source: Ambulatory Visit | Attending: Nurse Practitioner | Admitting: Nurse Practitioner

## 2019-07-06 ENCOUNTER — Other Ambulatory Visit: Payer: Self-pay

## 2019-07-06 DIAGNOSIS — C561 Malignant neoplasm of right ovary: Secondary | ICD-10-CM | POA: Insufficient documentation

## 2019-07-06 DIAGNOSIS — C562 Malignant neoplasm of left ovary: Secondary | ICD-10-CM | POA: Insufficient documentation

## 2019-07-06 DIAGNOSIS — C563 Malignant neoplasm of bilateral ovaries: Secondary | ICD-10-CM

## 2019-07-06 DIAGNOSIS — M898X9 Other specified disorders of bone, unspecified site: Secondary | ICD-10-CM | POA: Insufficient documentation

## 2019-07-06 MED ORDER — TECHNETIUM TC 99M MEDRONATE IV KIT
20.0000 | PACK | Freq: Once | INTRAVENOUS | Status: AC | PRN
  Administered 2019-07-06: 21.75 via INTRAVENOUS

## 2019-07-10 ENCOUNTER — Other Ambulatory Visit: Payer: Self-pay

## 2019-07-10 ENCOUNTER — Other Ambulatory Visit: Payer: Self-pay | Admitting: *Deleted

## 2019-07-10 ENCOUNTER — Inpatient Hospital Stay: Payer: Self-pay

## 2019-07-10 ENCOUNTER — Encounter: Payer: Self-pay | Admitting: Nurse Practitioner

## 2019-07-10 ENCOUNTER — Other Ambulatory Visit: Payer: Self-pay | Admitting: Internal Medicine

## 2019-07-10 DIAGNOSIS — C482 Malignant neoplasm of peritoneum, unspecified: Secondary | ICD-10-CM

## 2019-07-10 LAB — CBC WITH DIFFERENTIAL/PLATELET
Abs Immature Granulocytes: 0.02 10*3/uL (ref 0.00–0.07)
Basophils Absolute: 0 10*3/uL (ref 0.0–0.1)
Basophils Relative: 1 %
Eosinophils Absolute: 0.1 10*3/uL (ref 0.0–0.5)
Eosinophils Relative: 2 %
HCT: 37.5 % (ref 36.0–46.0)
Hemoglobin: 11.3 g/dL — ABNORMAL LOW (ref 12.0–15.0)
Immature Granulocytes: 1 %
Lymphocytes Relative: 25 %
Lymphs Abs: 1 10*3/uL (ref 0.7–4.0)
MCH: 27 pg (ref 26.0–34.0)
MCHC: 30.1 g/dL (ref 30.0–36.0)
MCV: 89.7 fL (ref 80.0–100.0)
Monocytes Absolute: 0.4 10*3/uL (ref 0.1–1.0)
Monocytes Relative: 11 %
Neutro Abs: 2.4 10*3/uL (ref 1.7–7.7)
Neutrophils Relative %: 60 %
Platelets: 284 10*3/uL (ref 150–400)
RBC: 4.18 MIL/uL (ref 3.87–5.11)
RDW: 12.8 % (ref 11.5–15.5)
WBC: 3.9 10*3/uL — ABNORMAL LOW (ref 4.0–10.5)
nRBC: 0 % (ref 0.0–0.2)

## 2019-07-10 LAB — BASIC METABOLIC PANEL
Anion gap: 8 (ref 5–15)
BUN: 12 mg/dL (ref 8–23)
CO2: 29 mmol/L (ref 22–32)
Calcium: 9.2 mg/dL (ref 8.9–10.3)
Chloride: 101 mmol/L (ref 98–111)
Creatinine, Ser: 0.66 mg/dL (ref 0.44–1.00)
GFR calc Af Amer: >60 mL/min (ref >60)
GFR calc non Af Amer: >60 mL/min (ref >60)
Glucose, Bld: 118 mg/dL — ABNORMAL HIGH (ref 70–99)
Potassium: 4.3 mmol/L (ref 3.5–5.1)
Sodium: 138 mmol/L (ref 135–145)

## 2019-07-10 NOTE — Progress Notes (Signed)
Patient on schedule for Porta Cath placement 07/18/2019, called to confirm arrival time of 0700, NPO after midnight prior to procedure, and driver after procedure, stated understanding.

## 2019-07-11 ENCOUNTER — Telehealth: Payer: Self-pay | Admitting: *Deleted

## 2019-07-11 ENCOUNTER — Telehealth: Payer: Self-pay | Admitting: Internal Medicine

## 2019-07-11 ENCOUNTER — Telehealth: Payer: Self-pay

## 2019-07-11 ENCOUNTER — Other Ambulatory Visit: Payer: Self-pay | Admitting: Internal Medicine

## 2019-07-11 MED ORDER — SERTRALINE HCL 25 MG PO TABS: 25.0000 mg | ORAL_TABLET | Freq: Every day | ORAL | 3 refills | Status: DC

## 2019-07-11 MED ORDER — CITALOPRAM HYDROBROMIDE 20 MG PO TABS: 20.0000 mg | ORAL_TABLET | Freq: Every day | ORAL | 2 refills | Status: DC

## 2019-07-11 NOTE — Progress Notes (Signed)
New script for zoloft sent to pharmacy; cancelled celexa script.   GB

## 2019-07-11 NOTE — Telephone Encounter (Signed)
Called Ryan at [facility]  and informed of physician response

## 2019-07-11 NOTE — Telephone Encounter (Signed)
On 12/21- Spoke to patient regarding anxiety-secondary to diagnosis of malignancy.  Recommend continue alprazolam short-term; and start patient on Celexa [given less interaction].  Prescription for Celexa sent.  # H/T-please inform patient that I am not able to fill alprazolam as requested at this time; as this is being a refilled by PCP [states pended order].

## 2019-07-11 NOTE — Telephone Encounter (Signed)
My chart msg sent to pt explaining md will not RF xanax and new rx sent celexa. Pcp prescribed the xanax.

## 2019-07-11 NOTE — Telephone Encounter (Signed)
Pharmacist Ryan with [facility] called reporting that there is a severe drug interaction with Citalopram and her Ondansetron and is asking if you could change it to Sertraline instead. Please advise

## 2019-07-11 NOTE — Telephone Encounter (Signed)
Please inform pharmacy; New script for zoloft sent to pharmacy; cancelled celexa script.  GB

## 2019-07-11 NOTE — Telephone Encounter (Signed)
HRD testing has been requested by Dr. Rogue Bussing. I have reached out to [employer], Faith Rogue, to inquire about combined somatic/germline testing in this patient without insurance. She will assist with ordering through [facility].

## 2019-07-12 NOTE — Telephone Encounter (Signed)
Pharmacy calling to check on the status of this for the pt.

## 2019-07-12 NOTE — Telephone Encounter (Signed)
Check [place] registry last filled 06/28/2019. MD is out of the office pls advise .Marland KitchenJohny Chess

## 2019-07-17 ENCOUNTER — Other Ambulatory Visit: Payer: Self-pay | Admitting: Radiology

## 2019-07-18 ENCOUNTER — Ambulatory Visit
Admission: RE | Admit: 2019-07-18 | Discharge: 2019-07-18 | Disposition: A | Discharge status: Home / Self Care | Payer: Self-pay | Source: Ambulatory Visit | Attending: Internal Medicine | Admitting: Internal Medicine

## 2019-07-18 ENCOUNTER — Other Ambulatory Visit: Payer: Self-pay

## 2019-07-18 DIAGNOSIS — C569 Malignant neoplasm of unspecified ovary: Secondary | ICD-10-CM | POA: Insufficient documentation

## 2019-07-18 DIAGNOSIS — C482 Malignant neoplasm of peritoneum, unspecified: Secondary | ICD-10-CM

## 2019-07-18 HISTORY — PX: IR IMAGING GUIDED PORT INSERTION: IMG5740

## 2019-07-18 LAB — CBC
HCT: 42.3 % (ref 36.0–46.0)
Hemoglobin: 13.4 g/dL (ref 12.0–15.0)
MCH: 28.3 pg (ref 26.0–34.0)
MCHC: 31.7 g/dL (ref 30.0–36.0)
MCV: 89.4 fL (ref 80.0–100.0)
Platelets: 190 10*3/uL (ref 150–400)
RBC: 4.73 MIL/uL (ref 3.87–5.11)
RDW: 14.9 % (ref 11.5–15.5)
WBC: 4.5 10*3/uL (ref 4.0–10.5)
nRBC: 0 % (ref 0.0–0.2)

## 2019-07-18 LAB — APTT: aPTT: 29 seconds (ref 24–36)

## 2019-07-18 LAB — PROTIME-INR
INR: 0.9 (ref 0.8–1.2)
Prothrombin Time: 12.1 seconds (ref 11.4–15.2)

## 2019-07-18 MED ORDER — FENTANYL CITRATE (PF) 100 MCG/2ML IJ SOLN
INTRAMUSCULAR | Status: AC
  Filled 2019-07-18: qty 2

## 2019-07-18 MED ORDER — HEPARIN SOD (PORK) LOCK FLUSH 100 UNIT/ML IV SOLN
INTRAVENOUS | Status: AC
  Filled 2019-07-18: qty 5

## 2019-07-18 MED ORDER — MIDAZOLAM HCL 5 MG/5ML IJ SOLN
INTRAMUSCULAR | Status: AC
  Filled 2019-07-18: qty 5

## 2019-07-18 MED ORDER — CEFAZOLIN SODIUM-DEXTROSE 2-4 GM/100ML-% IV SOLN
2.0000 g | Freq: Once | INTRAVENOUS | Status: AC
  Administered 2019-07-18: 2 g via INTRAVENOUS

## 2019-07-18 MED ORDER — SODIUM CHLORIDE 0.9 % IV SOLN: INTRAVENOUS | Status: DC

## 2019-07-18 MED ORDER — MIDAZOLAM HCL 2 MG/2ML IJ SOLN
INTRAMUSCULAR | Status: AC | PRN
  Administered 2019-07-18: 1 mg via INTRAVENOUS
  Administered 2019-07-18: 2 mg via INTRAVENOUS

## 2019-07-18 MED ORDER — FENTANYL CITRATE (PF) 100 MCG/2ML IJ SOLN
INTRAMUSCULAR | Status: AC | PRN
  Administered 2019-07-18 (×2): 50 ug via INTRAVENOUS

## 2019-07-18 NOTE — Progress Notes (Signed)
Patient clinically stable post Porta Cath placement with Dr Dellia Nims and oriented post procedure,, tolerated well. Vitals stable throughout entire procedure. Liquid bandade dry and intact to right port site. Report given to Amanda Pena with questions answered in specials for recovery prior to discharge.

## 2019-07-18 NOTE — Discharge Instructions (Signed)
Moderate Conscious Sedation, Adult, Care After °These instructions provide you with information about caring for yourself after your procedure. Your health care provider may also give you more specific instructions. Your treatment has been planned according to current medical practices, but problems sometimes occur. Call your health care provider if you have any problems or questions after your procedure. °What can I expect after the procedure? °After your procedure, it is common: °· To feel sleepy for several hours. °· To feel clumsy and have poor balance for several hours. °· To have poor judgment for several hours. °· To vomit if you eat too soon. °Follow these instructions at home: °For at least 24 hours after the procedure: ° °· Do not: °? Participate in activities where you could fall or become injured. °? Drive. °? Use heavy machinery. °? Drink alcohol. °? Take sleeping pills or medicines that cause drowsiness. °? Make important decisions or sign legal documents. °? Take care of children on your own. °· Rest. °Eating and drinking °· Follow the diet recommended by your health care provider. °· If you vomit: °? Drink water, juice, or soup when you can drink without vomiting. °? Make sure you have little or no nausea before eating solid foods. °General instructions °· Have a responsible adult stay with you until you are awake and alert. °· Take over-the-counter and prescription medicines only as told by your health care provider. °· If you smoke, do not smoke without supervision. °· Keep all follow-up visits as told by your health care provider. This is important. °Contact a health care provider if: °· You keep feeling nauseous or you keep vomiting. °· You feel light-headed. °· You develop a rash. °· You have a fever. °Get help right away if: °· You have trouble breathing. °This information is not intended to replace advice given to you by your health care provider. Make sure you discuss any questions you have  with your health care provider. °Document Released: 04/26/2013 Document Revised: 06/18/2017 Document Reviewed: 10/26/2015 °Elsevier Patient Education © 2020 [employer] °Implanted Port Home Guide °An implanted port is a device that is placed under the skin. It is usually placed in the chest. The device can be used to give IV medicine, to take blood, or for dialysis. You may have an implanted port if: °· You need IV medicine that would be irritating to the small veins in your hands or arms. °· You need IV medicines, such as antibiotics, for a long period of time. °· You need IV nutrition for a long period of time. °· You need dialysis. °Having a port means that your health care provider will not need to use the veins in your arms for these procedures. You may have fewer limitations when using a port than you would if you used other types of long-term IVs, and you will likely be able to return to normal activities after your incision heals. °An implanted port has two main parts: °· Reservoir. The reservoir is the part where a needle is inserted to give medicines or draw blood. The reservoir is round. After it is placed, it appears as a small, raised area under your skin. °· Catheter. The catheter is a thin, flexible tube that connects the reservoir to a vein. Medicine that is inserted into the reservoir goes into the catheter and then into the vein. °How is my port accessed? °To access your port: °· A numbing cream may be placed on the skin over the port site. °· Your   health care provider will put on a mask and sterile gloves. °· The skin over your port will be cleaned carefully with a germ-killing soap and allowed to dry. °· Your health care provider will gently pinch the port and insert a needle into it. °· Your health care provider will check for a blood return to make sure the port is in the vein and is not clogged. °· If your port needs to remain accessed to get medicine continuously (constant infusion), your  health care provider will place a clear bandage (dressing) over the needle site. The dressing and needle will need to be changed every week, or as told by your health care provider. °What is flushing? °Flushing helps keep the port from getting clogged. Follow instructions from your health care provider about how and when to flush the port. Ports are usually flushed with saline solution or a medicine called heparin. The need for flushing will depend on how the port is used: °· If the port is only used from time to time to give medicines or draw blood, the port may need to be flushed: °? Before and after medicines have been given. °? Before and after blood has been drawn. °? As part of routine maintenance. Flushing may be recommended every 4-6 weeks. °· If a constant infusion is running, the port may not need to be flushed. °· Throw away any syringes in a disposal container that is meant for sharp items (sharps container). You can buy a sharps container from a pharmacy, or you can make one by using an empty hard plastic bottle with a cover. °How long will my port stay implanted? °The port can stay in for as long as your health care provider thinks it is needed. When it is time for the port to come out, a surgery will be done to remove it. The surgery will be similar to the procedure that was done to put the port in. °Follow these instructions at home: ° °· Flush your port as told by your health care provider. °· If you need an infusion over several days, follow instructions from your health care provider about how to take care of your port site. Make sure you: °? Wash your hands with soap and water before you change your dressing. If soap and water are not available, use alcohol-based hand sanitizer. °? Change your dressing as told by your health care provider. °? Place any used dressings or infusion bags into a plastic bag. Throw that bag in the trash. °? Keep the dressing that covers the needle clean and dry. Do not  get it wet. °? Do not use scissors or sharp objects near the tube. °? Keep the tube clamped, unless it is being used. °· Check your port site every day for signs of infection. Check for: °? Redness, swelling, or pain. °? Fluid or blood. °? Pus or a bad smell. °· Protect the skin around the port site. °? Avoid wearing bra straps that rub or irritate the site. °? Protect the skin around your port from seat belts. Place a soft pad over your chest if needed. °· Bathe or shower as told by your health care provider. The site may get wet as long as you are not actively receiving an infusion. °· Return to your normal activities as told by your health care provider. Ask your health care provider what activities are safe for you. °· Carry a medical alert card or wear a medical alert bracelet at all   times. This will let health care providers know that you have an implanted port in case of an emergency. °Get help right away if: °· You have redness, swelling, or pain at the port site. °· You have fluid or blood coming from your port site. °· You have pus or a bad smell coming from the port site. °· You have a fever. °Summary °· Implanted ports are usually placed in the chest for long-term IV access. °· Follow instructions from your health care provider about flushing the port and changing bandages (dressings). °· Take care of the area around your port by avoiding clothing that puts pressure on the area, and by watching for signs of infection. °· Protect the skin around your port from seat belts. Place a soft pad over your chest if needed. °· Get help right away if you have a fever or you have redness, swelling, pain, drainage, or a bad smell at the port site. °This information is not intended to replace advice given to you by your health care provider. Make sure you discuss any questions you have with your health care provider. °Document Released: 07/06/2005 Document Revised: 10/28/2018 Document Reviewed: 08/08/2016 °Elsevier  Patient Education © 2020 [employer] ° °

## 2019-07-18 NOTE — Procedures (Signed)
  Procedure: R IJ Port catheter   EBL:   minimal Complications:  none immediate  See full dictation in [facility].  D. Marielena Harvell MD Main # [phone] Pager  [phone]    

## 2019-07-20 ENCOUNTER — Other Ambulatory Visit: Payer: Self-pay | Admitting: Licensed Clinical Social Worker

## 2019-07-20 ENCOUNTER — Other Ambulatory Visit: Payer: Self-pay

## 2019-07-20 DIAGNOSIS — E038 Other specified hypothyroidism: Secondary | ICD-10-CM

## 2019-07-23 ENCOUNTER — Other Ambulatory Visit: Payer: Self-pay | Admitting: Internal Medicine

## 2019-07-23 ENCOUNTER — Telehealth: Payer: Self-pay | Admitting: Internal Medicine

## 2019-07-23 ENCOUNTER — Other Ambulatory Visit: Payer: Self-pay | Admitting: Oncology

## 2019-07-23 MED ORDER — LIDOCAINE-PRILOCAINE 2.5-2.5 % EX CREA: TOPICAL_CREAM | CUTANEOUS | 0 refills | Status: DC

## 2019-07-23 NOTE — Progress Notes (Signed)
Pt wanted emla cream; unable to reach pt again.   Called in script for Emla cream.

## 2019-07-23 NOTE — Telephone Encounter (Signed)
Emla cream script sent.

## 2019-07-24 ENCOUNTER — Inpatient Hospital Stay: Payer: Self-pay

## 2019-07-24 ENCOUNTER — Inpatient Hospital Stay (HOSPITAL_BASED_OUTPATIENT_CLINIC_OR_DEPARTMENT_OTHER): Payer: Self-pay | Admitting: Internal Medicine

## 2019-07-24 ENCOUNTER — Encounter: Payer: Self-pay | Admitting: Nurse Practitioner

## 2019-07-24 ENCOUNTER — Other Ambulatory Visit: Payer: Self-pay

## 2019-07-24 ENCOUNTER — Inpatient Hospital Stay: Payer: Self-pay | Attending: Internal Medicine

## 2019-07-24 DIAGNOSIS — R634 Abnormal weight loss: Secondary | ICD-10-CM | POA: Insufficient documentation

## 2019-07-24 DIAGNOSIS — C482 Malignant neoplasm of peritoneum, unspecified: Secondary | ICD-10-CM

## 2019-07-24 DIAGNOSIS — Z5111 Encounter for antineoplastic chemotherapy: Secondary | ICD-10-CM | POA: Insufficient documentation

## 2019-07-24 DIAGNOSIS — E038 Other specified hypothyroidism: Secondary | ICD-10-CM

## 2019-07-24 DIAGNOSIS — R630 Anorexia: Secondary | ICD-10-CM | POA: Insufficient documentation

## 2019-07-24 DIAGNOSIS — R188 Other ascites: Secondary | ICD-10-CM | POA: Insufficient documentation

## 2019-07-24 DIAGNOSIS — F419 Anxiety disorder, unspecified: Secondary | ICD-10-CM | POA: Insufficient documentation

## 2019-07-24 DIAGNOSIS — R197 Diarrhea, unspecified: Secondary | ICD-10-CM | POA: Insufficient documentation

## 2019-07-24 DIAGNOSIS — R110 Nausea: Secondary | ICD-10-CM | POA: Insufficient documentation

## 2019-07-24 DIAGNOSIS — R918 Other nonspecific abnormal finding of lung field: Secondary | ICD-10-CM | POA: Insufficient documentation

## 2019-07-24 DIAGNOSIS — C786 Secondary malignant neoplasm of retroperitoneum and peritoneum: Secondary | ICD-10-CM | POA: Insufficient documentation

## 2019-07-24 DIAGNOSIS — Z79899 Other long term (current) drug therapy: Secondary | ICD-10-CM | POA: Insufficient documentation

## 2019-07-24 DIAGNOSIS — C561 Malignant neoplasm of right ovary: Secondary | ICD-10-CM | POA: Insufficient documentation

## 2019-07-24 DIAGNOSIS — C562 Malignant neoplasm of left ovary: Secondary | ICD-10-CM | POA: Insufficient documentation

## 2019-07-24 LAB — COMPREHENSIVE METABOLIC PANEL
ALT: 29 U/L (ref 0–44)
AST: 27 U/L (ref 15–41)
Albumin: 4.2 g/dL (ref 3.5–5.0)
Alkaline Phosphatase: 106 U/L (ref 38–126)
Anion gap: 10 (ref 5–15)
BUN: 18 mg/dL (ref 8–23)
CO2: 25 mmol/L (ref 22–32)
Calcium: 9.3 mg/dL (ref 8.9–10.3)
Chloride: 103 mmol/L (ref 98–111)
Creatinine, Ser: 0.64 mg/dL (ref 0.44–1.00)
GFR calc Af Amer: >60 mL/min (ref >60)
GFR calc non Af Amer: >60 mL/min (ref >60)
Glucose, Bld: 112 mg/dL — ABNORMAL HIGH (ref 70–99)
Potassium: 3.8 mmol/L (ref 3.5–5.1)
Sodium: 138 mmol/L (ref 135–145)
Total Bilirubin: 0.7 mg/dL (ref 0.3–1.2)
Total Protein: 7.5 g/dL (ref 6.5–8.1)

## 2019-07-24 LAB — CBC WITH DIFFERENTIAL/PLATELET
Abs Immature Granulocytes: 0.02 10*3/uL (ref 0.00–0.07)
Basophils Absolute: 0 10*3/uL (ref 0.0–0.1)
Basophils Relative: 0 %
Eosinophils Absolute: 0 10*3/uL (ref 0.0–0.5)
Eosinophils Relative: 1 %
HCT: 38.5 % (ref 36.0–46.0)
Hemoglobin: 12.2 g/dL (ref 12.0–15.0)
Immature Granulocytes: 0 %
Lymphocytes Relative: 24 %
Lymphs Abs: 1.4 10*3/uL (ref 0.7–4.0)
MCH: 28.3 pg (ref 26.0–34.0)
MCHC: 31.7 g/dL (ref 30.0–36.0)
MCV: 89.3 fL (ref 80.0–100.0)
Monocytes Absolute: 0.4 10*3/uL (ref 0.1–1.0)
Monocytes Relative: 7 %
Neutro Abs: 4 10*3/uL (ref 1.7–7.7)
Neutrophils Relative %: 68 %
Platelets: 113 10*3/uL — ABNORMAL LOW (ref 150–400)
RBC: 4.31 MIL/uL (ref 3.87–5.11)
RDW: 15.5 % (ref 11.5–15.5)
WBC: 5.9 10*3/uL (ref 4.0–10.5)
nRBC: 0 % (ref 0.0–0.2)

## 2019-07-24 MED ORDER — SODIUM CHLORIDE 0.9 % IV SOLN
175.0000 mg/m2 | Freq: Once | INTRAVENOUS | Status: AC
  Administered 2019-07-24: 288 mg via INTRAVENOUS
  Filled 2019-07-24: qty 48

## 2019-07-24 MED ORDER — DIPHENHYDRAMINE HCL 50 MG/ML IJ SOLN
50.0000 mg | Freq: Once | INTRAMUSCULAR | Status: AC
  Administered 2019-07-24: 50 mg via INTRAVENOUS
  Filled 2019-07-24: qty 1

## 2019-07-24 MED ORDER — SODIUM CHLORIDE 0.9 % IV SOLN
Freq: Once | INTRAVENOUS | Status: AC
  Filled 2019-07-24: qty 250

## 2019-07-24 MED ORDER — PROMETHAZINE HCL 25 MG PO TABS: 25.0000 mg | ORAL_TABLET | Freq: Four times a day (QID) | ORAL | 0 refills | Status: DC | PRN

## 2019-07-24 MED ORDER — SODIUM CHLORIDE 0.9 % IV SOLN
567.0000 mg | Freq: Once | INTRAVENOUS | Status: AC
  Administered 2019-07-24: 570 mg via INTRAVENOUS
  Filled 2019-07-24: qty 57

## 2019-07-24 MED ORDER — PALONOSETRON HCL INJECTION 0.25 MG/5ML
0.2500 mg | Freq: Once | INTRAVENOUS | Status: AC
  Administered 2019-07-24: 10:00:00 0.25 mg via INTRAVENOUS
  Filled 2019-07-24: qty 5

## 2019-07-24 MED ORDER — SERTRALINE HCL 50 MG PO TABS: 50.0000 mg | ORAL_TABLET | Freq: Every day | ORAL | 3 refills | Status: DC

## 2019-07-24 MED ORDER — HEPARIN SOD (PORK) LOCK FLUSH 100 UNIT/ML IV SOLN
INTRAVENOUS | Status: AC
  Filled 2019-07-24: qty 5

## 2019-07-24 MED ORDER — SODIUM CHLORIDE 0.9 % IV SOLN
Freq: Once | INTRAVENOUS | Status: AC
  Filled 2019-07-24: qty 5

## 2019-07-24 MED ORDER — HEPARIN SOD (PORK) LOCK FLUSH 100 UNIT/ML IV SOLN
500.0000 [IU] | Freq: Once | INTRAVENOUS | Status: AC
  Administered 2019-07-24: 500 [IU] via INTRAVENOUS
  Filled 2019-07-24: qty 5

## 2019-07-24 MED ORDER — SODIUM CHLORIDE 0.9% FLUSH
10.0000 mL | INTRAVENOUS | Status: DC | PRN
  Administered 2019-07-24: 09:00:00 10 mL via INTRAVENOUS
  Filled 2019-07-24: qty 10

## 2019-07-24 MED ORDER — FAMOTIDINE IN NACL 20-0.9 MG/50ML-% IV SOLN
20.0000 mg | Freq: Once | INTRAVENOUS | Status: AC
  Administered 2019-07-24: 20 mg via INTRAVENOUS
  Filled 2019-07-24: qty 50

## 2019-07-24 NOTE — Assessment & Plan Note (Addendum)
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 CEA -elevated at 600+. On  Carbo-taxol every 3 weeks.  Discussed regarding checking my riskMyriad.   # Proceed with Carbo-taxol # 2; Labs today reviewed;  acceptable for treatment today.  Discussed the rationale of not using booster injection.  # Weight loss-multifactorial-chemotherapy/underlying malignancy/ Anxiety.  We will make a referral to [facility].  # Dry heaves/ nausea-underlying anxiety/chemotherapy.  Continue Xanax/increase Zoloft.  Continue nausea medication.  #Diarrhea-secondary chemotherapy.  Discussed regarding increased fluid intake/use of Imodium.  # DISPOSITION: # Carbo-Taxol today; # Referral to [facility] re: weight loss # 10 days- cbc/bmp # jan 25th-MD; labs- cbc/cmp; ca-125- Carbo-Taxol- Dr.B

## 2019-07-24 NOTE — Progress Notes (Signed)
[facility] CONSULT NOTE  Patient Care Team: Plotnikov, [name], MD as PCP - General (Internal Medicine) Arvella Nigh, MD as Consulting Physician (Obstetrics and Gynecology) Clent Jacks, RN as Oncology Nurse Navigator  CHIEF COMPLAINTS/PURPOSE OF CONSULTATION: Peritoneal carcinomatosis  #  Oncology History Overview Note  #November 2020-omental caking/peritoneal carcinomatosis; bilateral adnexal masses 4-5 cm in size; multiple lung nodules;   # dec 8th 2020-omental biopsy; positive for high-grade serous adenocarcinoma; GYN origin.  CT scan chest-bilateral lung nodules; 3.8 cm soft tissue mass adjacent to GE junction  # DEC 11TH 2020-CARBO-Taxol [chemo consent]  # #Genetic counseling-referral.   # Thyroid cancer at 26y s/p thyroidectomy [no RAIU]  # NGS/MOLECULAR TESTS: P  # PALLIATIVE CARE EVALUATION:P  # PAIN MANAGEMENT: P   DIAGNOSIS: Bilateral ovarian cancer  STAGE: IV        ;  GOALS: Control  CURRENT/MOST RECENT THERAPY : Carbotaxol [C]    Peritoneal carcinoma ([place])  06/23/2019 Initial Diagnosis   Peritoneal carcinoma ([place])   06/30/2019 -  Chemotherapy   The patient had palonosetron (ALOXI) injection 0.25 mg, 0.25 mg, Intravenous,  Once, 2 of 6 cycles Administration: 0.25 mg (06/30/2019) CARBOplatin (PARAPLATIN) 570 mg in sodium chloride 0.9 % 250 mL chemo infusion, 570 mg (100 % of original dose 567 mg), Intravenous,  Once, 2 of 6 cycles Dose modification:   (original dose 567 mg, Cycle 1) Administration: 570 mg (06/30/2019) fosaprepitant (EMEND) 150 mg in sodium chloride 0.9 % 145 mL IVPB, 150 mg, Intravenous,  Once, 1 of 1 cycle PACLitaxel (TAXOL) 288 mg in sodium chloride 0.9 % 250 mL chemo infusion (> 80mg /m2), 175 mg/m2 = 288 mg, Intravenous,  Once, 2 of 6 cycles Administration: 288 mg (06/30/2019) fosaprepitant (EMEND) 150 mg, dexamethasone (DECADRON) 12 mg in sodium chloride 0.9 % 145 mL IVPB, , Intravenous,  Once, 1 of 5 cycles  for  chemotherapy treatment.       HISTORY OF PRESENTING ILLNESS:  Amanda Pena 64 y.o.  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    Patient had episode of diarrhea up to 10 loose stools 2 to 3 days post chemotherapy.  Improved with Imodium.  Possible weight loss.  Positive for nausea.  Positive for dry heaves.  Did not improve with Zofran.  Positive for anxiety.;  Currently on Zoloft for the last 2 weeks; patient taking Xanax only as needed.   Review of Systems  Constitutional: Positive for malaise/fatigue. Negative for chills, diaphoresis, fever and weight loss.       Night sweats  HENT: Negative for nosebleeds and sore throat.   Eyes: Negative for double vision.  Respiratory: Negative for cough, hemoptysis, sputum production, shortness of breath and wheezing.   Cardiovascular: Negative for chest pain, palpitations, orthopnea and leg swelling.  Gastrointestinal: Positive for abdominal pain and diarrhea. Negative for blood in stool, heartburn, melena, nausea and vomiting.  Genitourinary: Negative for dysuria, frequency and urgency.  Musculoskeletal: Positive for back pain. Negative for joint pain.  Skin: Negative for itching.  Neurological: Negative for dizziness, tingling, focal weakness, weakness and headaches.  Endo/Heme/Allergies: Does not bruise/bleed easily.  Psychiatric/Behavioral: Negative for depression. The patient is not nervous/anxious and does not have insomnia.      MEDICAL HISTORY:  Past Medical History:  Diagnosis Date  . Cancer ([place])   . HYPERLIPIDEMIA 04/09/2008   Qualifier: Diagnosis of  By: Wynona Luna   . HYPOTHYROIDISM 04/09/2008  Qualifier: Diagnosis of  By: Wynona Luna   . INSOMNIA, CHRONIC 08/21/2010   Qualifier: Diagnosis of  By: Wynona Luna   . PERSONAL HISTORY MALIGNANT NEOPLASM THYROID 08/21/2010   Qualifier: Diagnosis of  By: Wynona Luna     SURGICAL HISTORY: Past Surgical History:  Procedure Laterality Date  . IR IMAGING GUIDED PORT INSERTION  07/18/2019  . THYROIDECTOMY, PARTIAL      SOCIAL HISTORY: Social History   Socioeconomic History  . Marital status: Married    Spouse name: Not on file  . Number of children: Not on file  . Years of education: Not on file  . Highest education level: Not on file  Occupational History  . Not on file  Tobacco Use  . Smoking status: Never Smoker  . Smokeless tobacco: Never Used  Substance and Sexual Activity  . Alcohol use: Yes  . Drug use: No  . Sexual activity: Yes  Other Topics Concern  . 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 Difficulty of Paying Living Expenses: Not on file  Food Insecurity:   . Worried About Charity fundraiser in the Last Year: Not on file  . Ran Out of Food in the Last Year: Not on file  Transportation Needs:   . Lack of Transportation (Medical): Not on file  . Lack of Transportation (Non-Medical): Not on file  Physical Activity:   . Days of Exercise per Week: Not on file  . Minutes of Exercise per Session: Not on file  Stress:   . Feeling of Stress : Not on file  Social Connections:   . Frequency of Communication with Friends and Family: Not on file  . Frequency of Social Gatherings with Friends and Family: Not on file  . Attends Religious Services: Not on file  . Active Member of Clubs or Organizations: Not on file  . Attends Archivist Meetings: Not on file  . Marital Status: Not on file  Intimate Partner Violence:   . Fear of Current or Ex-Partner: Not on file  . Emotionally Abused: Not on file  . Physically Abused: Not on file  . Sexually Abused: Not on file    FAMILY HISTORY: Family History   Problem Relation Age of Onset  . Arthritis Mother   . Hyperlipidemia Mother   . Hyperlipidemia Father   . Diabetes Father   . Hypertension Father   . Heart disease Father 75       CAD  . Lung cancer Father   . Breast cancer Maternal Grandmother        in 1s    ALLERGIES:  has No Known Allergies.  MEDICATIONS:  Current Outpatient Medications  Medication Sig Dispense Refill  . ALPRAZolam (XANAX) 0.5 MG tablet TAKE 1 TABLET BY MOUTH 3 TIMES A DAY AS NEEDED FOR ANXIETY 30 tablet 1  . Ascorbic Acid (VITAMIN C) 1000 MG tablet Take 1,000 mg by mouth daily.    Marland Kitchen HYDROcodone-acetaminophen (NORCO/VICODIN) 5-325 MG tablet Take 1 tablet by mouth every 6 (six) hours as needed for severe pain. 45 tablet 0  . Omega-3 Fatty Acids (FISH OIL) 1000 MG CAPS Take 2 capsules by mouth daily.    . ondansetron (ZOFRAN) 8 MG tablet One pill every  8 hours as needed for nausea/vomitting. 40 tablet 1  . Probiotic Product (PROBIOTIC-10) CHEW Chew 1 capsule by mouth.    . prochlorperazine (COMPAZINE) 10 MG tablet Take 1 tablet (10 mg total) by mouth every 6 (six) hours as needed for nausea or vomiting. 40 tablet 1  . promethazine (PHENERGAN) 25 MG tablet Take 1 tablet (25 mg total) by mouth every 6 (six) hours as needed for nausea or vomiting. 30 tablet 0  . sertraline (ZOLOFT) 50 MG tablet Take 1 tablet (50 mg total) by mouth daily. 30 tablet 3  . SYNTHROID 75 MCG tablet TAKE 1 TABLET (75 MCG TOTAL) BY MOUTH DAILY BEFORE BREAKFAST. 90 tablet 1  . traMADol (ULTRAM) 50 MG tablet Take 1 tablet (50 mg total) by mouth every 6 (six) hours as needed for moderate pain or severe pain. 30 tablet 0  . vitamin E 1000 UNIT capsule Take 4,000 Units by mouth daily.    Marland Kitchen zolpidem (AMBIEN) 10 MG tablet TAKE 1 TABLET BY MOUTH EVERY DAY AT BEDTIME AS NEEDED FOR SLEEP 30 tablet 3  . lidocaine-prilocaine (EMLA) cream Apply generously to the port area 45-60 mins prior. 30 g 0   No current facility-administered medications for this  visit.   Facility-Administered Medications Ordered in Other Visits  Medication Dose Route Frequency Provider Last Rate Last Admin  . CARBOplatin (PARAPLATIN) 570 mg in sodium chloride 0.9 % 250 mL chemo infusion  570 mg Intravenous Once Charlaine Dalton R, MD      . famotidine (PEPCID) IVPB 20 mg premix  20 mg Intravenous Once Charlaine Dalton R, MD 200 mL/hr at 07/24/19 1007 20 mg at 07/24/19 1007  . fosaprepitant (EMEND) 150 mg, dexamethasone (DECADRON) 12 mg in sodium chloride 0.9 % 145 mL IVPB   Intravenous Once Charlaine Dalton R, MD      . heparin lock flush 100 unit/mL  500 Units Intravenous Once Charlaine Dalton R, MD      . PACLitaxel (TAXOL) 288 mg in sodium chloride 0.9 % 250 mL chemo infusion (> 80mg /m2)  175 mg/m2 (Treatment Plan Recorded) Intravenous Once Charlaine Dalton R, MD      . sodium chloride flush (NS) 0.9 % injection 10 mL  10 mL Intravenous PRN Cammie Sickle, MD   10 mL at 07/24/19 0848    PHYSICAL EXAMINATION: ECOG PERFORMANCE STATUS: 1 - Symptomatic but completely ambulatory  Vitals:   07/24/19 0903  BP: (!) 159/90  Pulse: 86  Temp: 97.9 F (36.6 C)   Filed Weights   07/24/19 0903  Weight: 116 lb (52.6 kg)    Physical Exam  Constitutional: She is oriented to person, place, and time and well-developed, well-nourished, and in no distress.  Accompanied by daughter.  Walking independently.  HENT:  Head: Normocephalic and atraumatic.  Mouth/Throat: Oropharynx is clear and moist. No oropharyngeal exudate.  Eyes: Pupils are equal, round, and reactive to light.  Cardiovascular: Normal rate and regular rhythm.  Pulmonary/Chest: Effort normal and breath sounds normal. No respiratory distress. She has no wheezes.  Abdominal: Soft. Bowel sounds are normal. She exhibits distension. She exhibits no mass. There is no abdominal tenderness. There is no rebound and no guarding.  Musculoskeletal:        General: No tenderness or edema. Normal range  of motion.     Cervical back: Normal range of motion and neck supple.  Neurological: She is alert and oriented to person, place, and time.  Skin: Skin is warm.  Mild rash on the  abdomen.  Psychiatric: Affect normal.     LABORATORY DATA:  I have reviewed the data as listed Lab Results  Component Value Date   WBC 5.9 07/24/2019   HGB 12.2 07/24/2019   HCT 38.5 07/24/2019   MCV 89.3 07/24/2019   PLT 113 (L) 07/24/2019   Recent Labs    08/03/18 0809 08/03/18 0809 06/13/19 1517 06/30/19 0817 07/03/19 1110 07/10/19 1110 07/24/19 0829  NA 139  --  139 139 136 138 138  K 4.4  --  3.9 4.2 4.1 4.3 3.8  CL 103  --  102 99 98 101 103  CO2 29  --  30 28 28 29 25   GLUCOSE 100*  --  117* 110* 116* 118* 112*  BUN 16  --  20 14 13 12 18   CREATININE 0.88  --  0.89 0.77 0.67 0.66 0.64  CALCIUM 9.7  --  9.1 8.8* 8.7* 9.2 9.3  GFRNONAA  --    < >  --  >60 >60 >60 >60  GFRAA  --    < >  --  >60 >60 >60 >60  PROT 7.3  --  6.9 6.7 6.9  --  7.5  ALBUMIN 4.4  --  3.7 2.8* 2.9*  --  4.2  AST 15  --  31 31 48*  --  27  ALT 10  --  22 23 43  --  29  ALKPHOS 63  --  120* 176* 212*  --  106  BILITOT 0.8  --  0.4 0.5 0.5  --  0.7  BILIDIR 0.1  --  0.0  --   --   --   --    < > = values in this interval not displayed.    RADIOGRAPHIC STUDIES: I have personally reviewed the radiological images as listed and agreed with the findings in the report. CT Chest W Contrast  Result Date: 06/27/2019 CLINICAL DATA:  64 y.o. female diagnosed with probable stage IV epithelial ovarian cancer with lung metastases Fully assess lung nodules, non smoker. Lung nodule, >=1cm Known bilateral adnexal masses; pulmonary nodules in RLL lung. Staging EXAM: CT CHEST WITH CONTRAST TECHNIQUE: Multidetector CT imaging of the chest was performed during intravenous contrast administration. CONTRAST:  41mL OMNIPAQUE IOHEXOL 300 MG/ML  SOLN COMPARISON:  CT of the abdomen and pelvis on 06/14/2019 FINDINGS: Cardiovascular: Heart  size is normal. There is atherosclerotic calcification of coronary vessels. No pericardial effusion. Mediastinum/Nodes: There is a soft tissue mass adjacent to the GE junction, measuring 3.8 x 2.2 centimeters. UPPER and mid esophagus are normal in appearance. Status post resection of the LEFT lobe of the thyroid gland. RIGHT lobe is unremarkable. No significant mediastinal, hilar, or axillary adenopathy. Lungs/Pleura: Multiple nodules are identified in the RIGHT LOWER lobe, largest measuring 1.2 x 0.8 centimeters on image 100 of series 3. There are at least 6 other nodules within the RIGHT LOWER lobe, measuring 9 millimeters and smaller. 3 millimeter nodule is identified in the posterior LEFT UPPER lobe on image 47 of series 3. There is small RIGHT pleural effusion. Minimal subsegmental atelectasis at the RIGHT lung base. Upper Abdomen: GE junction mass, measured above. Ascites. Musculoskeletal: No suspicious lytic or blastic lesions are identified. IMPRESSION: 1. Multiple bilateral pulmonary nodules, suspicious for metastatic disease. Largest mass is mean of 1.0 centimeters in the RIGHT LOWER lobe. 2. Soft tissue mass adjacent to the gastroesophageal junction, suspicious for metastasis. 3. RIGHT pleural effusion and RIGHT basilar atelectasis. 4. Ascites. 5. Coronary  artery disease. 6. Status post resection of the LEFT lobe of the thyroid gland. 7. Aortic Atherosclerosis (ICD10-I70.0). Electronically Signed   By: Nolon Nations M.D.   On: 06/27/2019 16:27   NM Bone Scan Whole Body  Result Date: 07/06/2019 CLINICAL DATA:  Metastatic ovarian cancer with new bone pain and elevated alkaline phosphatase. EXAM: NUCLEAR MEDICINE WHOLE BODY BONE SCAN TECHNIQUE: Whole body anterior and posterior images were obtained approximately 3 hours after intravenous injection of radiopharmaceutical. RADIOPHARMACEUTICALS:  21.75 mCi Technetium-66m MDP IV COMPARISON:  CT scan 06/27/2019 FINDINGS: No areas of abnormal uptake are  identified in the axial or appendicular skeleton to suggest osseous metastatic disease. Areas of mild degenerative type uptake are noted. IMPRESSION: Negative whole-body bone scan for osseous metastatic disease. Electronically Signed   By: Marijo Sanes M.D.   On: 07/06/2019 16:31   CT BIOPSY  Result Date: 06/27/2019 INDICATION: Omental mass. EXAM: CT-DIRECTED BIOPSY MEDICATIONS: None. ANESTHESIA/SEDATION: Moderate (conscious) sedation was employed during this procedure. A total of Versed 3 mg and Fentanyl 50 mcg was administered intravenously. Moderate Sedation Time: 15 minutes. The patient's level of consciousness and vital signs were monitored continuously by radiology nursing throughout the procedure under my direct supervision. FLUOROSCOPY TIME:  Fluoroscopy Time: 0 minutes 0 seconds (0 mGy). COMPLICATIONS: None immediate. PROCEDURE: After discussing the risks and benefits of this procedure the patient informed consent was obtained. Rectal/colonic contrast administered. Abdomen was sterilely prepped and draped. Following local anesthesia with 1% lidocaine CT-directed omental biopsy was performed and 2 separate 23 mm 18 gauge core samples obtained. There no complications. IMPRESSION: Successful CT-directed omental mass biopsy. Electronically Signed   By: Marcello Moores  Register   On: 06/27/2019 13:25   IR IMAGING GUIDED PORT INSERTION  Result Date: 07/18/2019 CLINICAL DATA:  Metastatic ovarian carcinoma, needs durable venous access for chemotherapy. EXAM: TUNNELED PORT CATHETER PLACEMENT WITH ULTRASOUND AND FLUOROSCOPIC GUIDANCE FLUOROSCOPY TIME:  0.3 minute; 117  uGym2 DAP ANESTHESIA/SEDATION: Intravenous Fentanyl 118mcg and Versed 3mg  were administered as conscious sedation during continuous monitoring of the patient's level of consciousness and physiological / cardiorespiratory status by the radiology RN, with a total moderate sedation time of 18 minutes. TECHNIQUE: The procedure, risks, benefits, and  alternatives were explained to the patient. Questions regarding the procedure were encouraged and answered. The patient understands and consents to the procedure. As antibiotic prophylaxis, cefazolin 2 g was ordered pre-procedure and administered intravenously within one hour of incision. Patency of the right IJ vein was confirmed with ultrasound with image documentation. An appropriate skin site was determined. Skin site was marked. Region was prepped using maximum barrier technique including cap and mask, sterile gown, sterile gloves, large sterile sheet, and Chlorhexidine as cutaneous antisepsis. The region was infiltrated locally with 1% lidocaine. Under real-time ultrasound guidance, the right IJ vein was accessed with a 21 gauge micropuncture needle; the needle tip within the vein was confirmed with ultrasound image documentation. Needle was exchanged over a 018 guidewire for transitional dilator, and vascular measurement was performed. A small incision was made on the right anterior chest wall and a subcutaneous pocket fashioned. The power-injectable port was positioned and its catheter tunneled to the right IJ dermatotomy site. The transitional dilator was exchanged over an Amplatz wire for a peel-away sheath, through which the port catheter, which had been trimmed to the appropriate length, was advanced and positioned under fluoroscopy with its tip at the cavoatrial junction. Spot chest radiograph confirms good catheter position and no pneumothorax. The port was flushed per protocol.  The pocket was closed with deep interrupted and subcuticular continuous 3-0 Monocryl sutures. The incisions were covered with Dermabond then covered with a sterile dressing. The patient tolerated the procedure well. COMPLICATIONS: COMPLICATIONS None immediate IMPRESSION: Technically successful right IJ power-injectable port catheter placement. Ready for routine use. Electronically Signed   By: Lucrezia Europe M.D.   On: 07/18/2019  10:52    ASSESSMENT & PLAN:   Peritoneal carcinoma ([place]) #Stage IV -peritoneal carcinomatosis-omental caking/bilateral adnexal masses-high-grade serous carcinoma; GYN origin; multiple lung nodules; 3.8 x 2.5 cm mass adjacent to GE junction. CEA -elevated at 600+. On  Carbo-taxol every 3 weeks.  Discussed regarding checking my riskMyriad.   # Proceed with Carbo-taxol # 2; Labs today reviewed;  acceptable for treatment today.  Discussed the rationale of not using booster injection.  # Weight loss-multifactorial-chemotherapy/underlying malignancy/ Anxiety.  We will make a referral to [facility].  # Dry heaves/ nausea-underlying anxiety/chemotherapy.  Continue Xanax/increase Zoloft.  Continue nausea medication.  #Diarrhea-secondary chemotherapy.  Discussed regarding increased fluid intake/use of Imodium.  # DISPOSITION: # Carbo-Taxol today; # Referral to [facility] re: weight loss # 10 days- cbc/bmp # jan 25th-MD; labs- cbc/cmp; ca-125- Carbo-Taxol- Dr.B   All questions were answered. The patient knows to call the clinic with any problems, questions or concerns.    Cammie Sickle, MD 07/24/2019 10:10 AM

## 2019-07-25 LAB — CA 125: Cancer Antigen (CA) 125: 145 U/mL — ABNORMAL HIGH (ref 0.0–38.1)

## 2019-07-25 LAB — THYROID PANEL WITH TSH
Free Thyroxine Index: 2.7 (ref 1.2–4.9)
T3 Uptake Ratio: 29 % (ref 24–39)
T4, Total: 9.4 ug/dL (ref 4.5–12.0)
TSH: 0.5 u[IU]/mL (ref 0.450–4.500)

## 2019-07-28 ENCOUNTER — Telehealth: Payer: Self-pay | Admitting: *Deleted

## 2019-07-28 ENCOUNTER — Other Ambulatory Visit: Payer: Self-pay

## 2019-07-28 ENCOUNTER — Encounter: Payer: Self-pay | Admitting: Internal Medicine

## 2019-07-28 ENCOUNTER — Inpatient Hospital Stay (HOSPITAL_BASED_OUTPATIENT_CLINIC_OR_DEPARTMENT_OTHER): Payer: Self-pay | Admitting: Nurse Practitioner

## 2019-07-28 ENCOUNTER — Encounter: Payer: Self-pay | Admitting: Nurse Practitioner

## 2019-07-28 DIAGNOSIS — T451X5A Adverse effect of antineoplastic and immunosuppressive drugs, initial encounter: Secondary | ICD-10-CM

## 2019-07-28 DIAGNOSIS — R239 Unspecified skin changes: Secondary | ICD-10-CM

## 2019-07-28 MED ORDER — PREDNISONE 5 MG PO TABS: 10.0000 mg | ORAL_TABLET | Freq: Every day | ORAL | 0 refills | Status: AC

## 2019-07-28 MED ORDER — TRIAMCINOLONE ACETONIDE 0.1 % EX CREA: 1.0000 "application " | TOPICAL_CREAM | Freq: Two times a day (BID) | CUTANEOUS | 0 refills | Status: DC

## 2019-07-28 NOTE — Telephone Encounter (Signed)
Virtual symptom management visit sounds reasonable. Amanda Pena is virtual today.   [name]

## 2019-07-28 NOTE — Telephone Encounter (Signed)
Patient called reporting that she awoke this morning and has a rash on the back of her head. She states she thinks it is a side effect of her chemotherapy, but wants to know if she needs to do anything about it. Please advise.

## 2019-07-28 NOTE — Telephone Encounter (Signed)
Patient accepts 1130 virtual appointment

## 2019-07-29 ENCOUNTER — Other Ambulatory Visit: Payer: Self-pay | Admitting: Internal Medicine

## 2019-07-29 MED ORDER — SYNTHROID 75 MCG PO TABS: 75.0000 ug | ORAL_TABLET | Freq: Every day | ORAL | 3 refills | Status: DC

## 2019-08-01 NOTE — Progress Notes (Signed)
Virtual Visit Progress Note  [facility]  Telephone:([phone] [phone]  I connected with Amanda Pena on 08/01/19 at 11:30 AM EST by video enabled telemedicine visit and verified that I am speaking with the correct person using two identifiers. We attempted video visit but this failed and we reverted to telephone visit. She provided video image via mychart.   I discussed the limitations, risks, security and privacy concerns of performing an evaluation and management service by telemedicine and the availability of in-person appointments. I also discussed with the patient that there may be a patient responsible charge related to this service. The patient expressed understanding and agreed to proceed.   Other persons participating in the visit and their role in the encounter: none  Patient's location: home Provider's location: home  Chief Complaint: rash    Patient Care Team: Plotnikov, [name], MD as PCP - General (Internal Medicine) Arvella Nigh, MD as Consulting Physician (Obstetrics and Gynecology) Clent Jacks, RN as Oncology Nurse Navigator   Name of the patient: Amanda Pena  CX:[id number]  [birth year]   Date of visit: 08/01/19  Diagnosis- Ovarian Cancer  Chief complaint/ Reason for visit- Rash  Heme/Onc history:  Oncology History Overview Note  #November 2020-omental caking/peritoneal carcinomatosis; bilateral adnexal masses 4-5 cm in size; multiple lung nodules;   # dec 8th 2020-omental biopsy; positive for high-grade serous adenocarcinoma; GYN origin.  CT scan chest-bilateral lung nodules; 3.8 cm soft tissue mass adjacent to GE junction  # DEC 11TH 2020-CARBO-Taxol [chemo consent]  # #Genetic counseling-referral.   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[place])   06/30/2019 -  Chemotherapy   The patient had palonosetron (ALOXI) injection 0.25 mg, 0.25 mg, Intravenous,  Once, 2 of 6 cycles Administration: 0.25 mg (06/30/2019), 0.25 mg (07/24/2019) CARBOplatin (PARAPLATIN) 570 mg in sodium chloride 0.9 % 250 mL chemo infusion, 570 mg (100 % of original dose 567 mg), Intravenous,  Once, 2 of 6 cycles Dose modification:   (original dose 567 mg, Cycle 1) Administration: 570 mg (06/30/2019), 570 mg (07/24/2019) fosaprepitant (EMEND) 150 mg in sodium chloride 0.9 % 145 mL IVPB, 150 mg, Intravenous,  Once, 1 of 1 cycle PACLitaxel (TAXOL) 288 mg in sodium chloride 0.9 % 250 mL chemo infusion (> 80mg /m2), 175 mg/m2 = 288 mg, Intravenous,  Once, 2 of 6 cycles Administration: 288 mg (06/30/2019), 288 mg (07/24/2019) fosaprepitant (EMEND) 150 mg, dexamethasone (DECADRON) 12 mg in sodium chloride 0.9 % 145 mL IVPB, , Intravenous,  Once, 1 of 5 cycles Administration:  (07/24/2019)  for chemotherapy treatment.      Interval history- Amanda Pena, 64 year old female with above history of ovarian cancer, currently receiving carbo-taxol chemotherapy, presents to symptom management clinic virtually for ocmplaints of rash. Symptoms started this morning, she localizes to back of her head an neck. Describes and reddened areas. Has not spread to other parts of the body. She has not tried putting anything on the affected areas. Nothing seems to make symptoms better or worse. No itching, drainage. No pain. Symptoms interfere with dressing/wearing hats and are unsightly. She has not sought evaluation for  symptoms previously. Last chemo was on 07/24/2019. Not currently taking any steroids. Never had symptoms like this previously.   ECOG FS:1 - Symptomatic but completely ambulatory  Review of systems-  Review of Systems  Constitutional: Negative for chills, fever, malaise/fatigue and weight loss.  HENT: Negative for hearing loss, nosebleeds, sore throat and tinnitus.   Eyes: Negative for blurred vision and double vision.  Respiratory: Negative for cough, hemoptysis, shortness of breath and wheezing.   Cardiovascular: Negative for chest pain, palpitations and leg swelling.  Gastrointestinal: Negative for abdominal pain, blood in stool, constipation, diarrhea, melena, nausea and vomiting.  Genitourinary: Negative for dysuria and urgency.  Musculoskeletal: Negative for back pain, falls, joint pain and myalgias.  Skin: Positive for rash. Negative for itching.  Neurological: Negative for dizziness, tingling, sensory change, loss of consciousness, weakness and headaches.  Endo/Heme/Allergies: Negative for environmental allergies. Does not bruise/bleed easily.  Psychiatric/Behavioral: Negative for depression. The patient is not nervous/anxious and does not have insomnia.     Current treatment- carbo-taxol chemotherapy  No Known Allergies  Past Medical History:  Diagnosis Date  . Cancer ([place])   . HYPERLIPIDEMIA 04/09/2008   Qualifier: Diagnosis of  By: Wynona Luna   . HYPOTHYROIDISM 04/09/2008   Qualifier: Diagnosis of  By: Wynona Luna   . INSOMNIA, CHRONIC 08/21/2010   Qualifier: Diagnosis of  By: Wynona Luna   . 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 IR IMAGING GUIDED PORT INSERTION  07/18/2019  . THYROIDECTOMY, PARTIAL      Social History   Socioeconomic History  . Marital status: Married    Spouse name: Not on file  . Number of children: Not on file  . Years of education: Not on file  . Highest education level: Not on file  Occupational History  . Not on file  Tobacco Use  . Smoking status: Never Smoker  . Smokeless tobacco: Never Used   Substance and Sexual Activity  . Alcohol use: Yes  . Drug use: No  . Sexual activity: Yes  Other Topics Concern  . 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 Difficulty of Paying Living Expenses: Not on file  Food Insecurity:   . Worried About Charity fundraiser in the Last Year: Not on file  . Ran Out of Food in the Last Year: Not on file  Transportation Needs:   . Lack of Transportation (Medical): Not on file  . Lack of Transportation (Non-Medical): Not on file  Physical Activity:   . Days of Exercise per Week: Not on file  . Minutes of Exercise per Session: Not on file  Stress:   . Feeling of Stress : Not on file  Social Connections:   . Frequency of Communication with Friends and Family: Not on file  . Frequency of Social Gatherings with Friends and Family: Not on file  . Attends Religious Services: Not on file  . Active Member of Clubs or Organizations: Not on file  . Attends Archivist Meetings: Not on file  . Marital Status: Not on file  Intimate Partner Violence:   . Fear of Current or  Ex-Partner: Not on file  . Emotionally Abused: Not on file  . Physically Abused: Not on file  . Sexually Abused: Not on file    Family History  Problem Relation Age of Onset  . Arthritis Mother   . Hyperlipidemia Mother   . Hyperlipidemia Father   . Diabetes Father   . Hypertension Father   . Heart disease Father 72       CAD  . Lung cancer Father   . Breast cancer Maternal Grandmother        in 23s     Current Outpatient Medications:  .  ALPRAZolam (XANAX) 0.5 MG tablet, TAKE 1 TABLET BY MOUTH 3 TIMES A DAY AS NEEDED FOR ANXIETY, Disp: 30 tablet, Rfl: 1 .  Ascorbic Acid (VITAMIN C) 1000 MG tablet, Take 1,000 mg by mouth daily., Disp: , Rfl:  .   HYDROcodone-acetaminophen (NORCO/VICODIN) 5-325 MG tablet, Take 1 tablet by mouth every 6 (six) hours as needed for severe pain., Disp: 45 tablet, Rfl: 0 .  lidocaine-prilocaine (EMLA) cream, Apply generously to the port area 45-60 mins prior., Disp: 30 g, Rfl: 0 .  Omega-3 Fatty Acids (FISH OIL) 1000 MG CAPS, Take 2 capsules by mouth daily., Disp: , Rfl:  .  ondansetron (ZOFRAN) 8 MG tablet, One pill every 8 hours as needed for nausea/vomitting., Disp: 40 tablet, Rfl: 1 .  predniSONE (DELTASONE) 5 MG tablet, Take 2 tablets (10 mg total) by mouth daily with breakfast for 5 days., Disp: 10 tablet, Rfl: 0 .  Probiotic Product (PROBIOTIC-10) CHEW, Chew 1 capsule by mouth., Disp: , Rfl:  .  prochlorperazine (COMPAZINE) 10 MG tablet, Take 1 tablet (10 mg total) by mouth every 6 (six) hours as needed for nausea or vomiting., Disp: 40 tablet, Rfl: 1 .  promethazine (PHENERGAN) 25 MG tablet, Take 1 tablet (25 mg total) by mouth every 6 (six) hours as needed for nausea or vomiting., Disp: 30 tablet, Rfl: 0 .  sertraline (ZOLOFT) 50 MG tablet, Take 1 tablet (50 mg total) by mouth daily., Disp: 30 tablet, Rfl: 3 .  SYNTHROID 75 MCG tablet, Take 1 tablet (75 mcg total) by mouth daily before breakfast., Disp: 90 tablet, Rfl: 3 .  traMADol (ULTRAM) 50 MG tablet, Take 1 tablet (50 mg total) by mouth every 6 (six) hours as needed for moderate pain or severe pain., Disp: 30 tablet, Rfl: 0 .  triamcinolone cream (KENALOG) 0.1 %, Apply 1 application topically 2 (two) times daily., Disp: 80 g, Rfl: 0 .  vitamin E 1000 UNIT capsule, Take 4,000 Units by mouth daily., Disp: , Rfl:  .  zolpidem (AMBIEN) 10 MG tablet, TAKE 1 TABLET BY MOUTH EVERY DAY AT BEDTIME AS NEEDED FOR SLEEP, Disp: 30 tablet, Rfl: 3  Physical exam: Exam limited due to telemedicine  Physical Exam Skin:    Comments: Maculopapular rash on neck spreading up the back of head.  No evidence of secondary infection, drainage.       CMP Latest Ref Rng &  Units 07/24/2019  Glucose 70 - 99 mg/dL 112(H)  BUN 8 - 23 mg/dL 18  Creatinine 0.44 - 1.00 mg/dL 0.64  Sodium 135 - 145 mmol/L 138  Potassium 3.5 - 5.1 mmol/L 3.8  Chloride 98 - 111 mmol/L 103  CO2 22 - 32 mmol/L 25  Calcium 8.9 - 10.3 mg/dL 9.3  Total Protein 6.5 - 8.1 g/dL 7.5  Total Bilirubin 0.3 - 1.2 mg/dL 0.7  Alkaline Phos 38 - 126 U/L 106  AST 15 - 41 U/L 27  ALT 0 - 44 U/L 29   CBC Latest Ref Rng & Units 07/24/2019  WBC 4.0 - 10.5 K/uL 5.9  Hemoglobin 12.0 - 15.0 g/dL 12.2  Hematocrit 36.0 - 46.0 % 38.5  Platelets 150 - 400 K/uL 113(L)    No images are attached to the encounter.  NM Bone Scan Whole Body  Result Date: 07/06/2019 CLINICAL DATA:  Metastatic ovarian cancer with new bone pain and elevated alkaline phosphatase. EXAM: NUCLEAR MEDICINE WHOLE BODY BONE SCAN TECHNIQUE: Whole body anterior and posterior images were obtained approximately 3 hours after intravenous injection of radiopharmaceutical. RADIOPHARMACEUTICALS:  21.75 mCi Technetium-41m MDP IV COMPARISON:  CT scan 06/27/2019 FINDINGS: No areas of abnormal uptake are identified in the axial or appendicular skeleton to suggest osseous metastatic disease. Areas of mild degenerative type uptake are noted. IMPRESSION: Negative whole-body bone scan for osseous metastatic disease. Electronically Signed   By: Marijo Sanes M.D.   On: 07/06/2019 16:31   IR IMAGING GUIDED PORT INSERTION  Result Date: 07/18/2019 CLINICAL DATA:  Metastatic ovarian carcinoma, needs durable venous access for chemotherapy. EXAM: TUNNELED PORT CATHETER PLACEMENT WITH ULTRASOUND AND FLUOROSCOPIC GUIDANCE FLUOROSCOPY TIME:  0.3 minute; 117  uGym2 DAP ANESTHESIA/SEDATION: Intravenous Fentanyl 171mcg and Versed 3mg  were administered as conscious sedation during continuous monitoring of the patient's level of consciousness and physiological / cardiorespiratory status by the radiology RN, with a total moderate sedation time of 18 minutes. TECHNIQUE: The  procedure, risks, benefits, and alternatives were explained to the patient. Questions regarding the procedure were encouraged and answered. The patient understands and consents to the procedure. As antibiotic prophylaxis, cefazolin 2 g was ordered pre-procedure and administered intravenously within one hour of incision. Patency of the right IJ vein was confirmed with ultrasound with image documentation. An appropriate skin site was determined. Skin site was marked. Region was prepped using maximum barrier technique including cap and mask, sterile gown, sterile gloves, large sterile sheet, and Chlorhexidine as cutaneous antisepsis. The region was infiltrated locally with 1% lidocaine. Under real-time ultrasound guidance, the right IJ vein was accessed with a 21 gauge micropuncture needle; the needle tip within the vein was confirmed with ultrasound image documentation. Needle was exchanged over a 018 guidewire for transitional dilator, and vascular measurement was performed. A small incision was made on the right anterior chest wall and a subcutaneous pocket fashioned. The power-injectable port was positioned and its catheter tunneled to the right IJ dermatotomy site. The transitional dilator was exchanged over an Amplatz wire for a peel-away sheath, through which the port catheter, which had been trimmed to the appropriate length, was advanced and positioned under fluoroscopy with its tip at the cavoatrial junction. Spot chest radiograph confirms good catheter position and no pneumothorax. The port was flushed per protocol. The pocket was closed with deep interrupted and subcuticular continuous 3-0 Monocryl sutures. The incisions were covered with Dermabond then covered with a sterile dressing. The patient tolerated the procedure well. COMPLICATIONS: COMPLICATIONS None immediate IMPRESSION: Technically successful right IJ power-injectable port catheter placement. Ready for routine use. Electronically Signed   By: Lucrezia Europe M.D.   On: 07/18/2019 10:52    Assessment and plan- Patient is a 64 y.o. female diagnosed with peritoneal carcinomatosis currently receiving carbo-Taxol chemotherapy, who presents to symptom management clinic for chemotherapy-induced skin rash.  1.  Chemotherapy-acute problem.  Induced skin rash-while etiology is unclear symptoms most consistent with chemotherapy induced skin rash.  Grade 1. Suspect Taxol.  Start  triamcinolone topical steroid cream twice daily.  No evidence of secondary infection therefore hold off on antibiotics.  Will send short course of oral steroids which she can take if symptoms worsen spread over the weekend.  Advised that symptoms will likely improve as she gets further away from her most recent chemotherapy though may recur with future cycles.  If symptoms significant may need to consider dose reduction though I will defer to Dr.Brahmanday.   Disposition: Return to clinic if symptoms do not improve or worsen Follow-up with Dr. Rogue Bussing as scheduled.    Visit Diagnosis 1. Skin changes related to chemotherapy    Patient expressed understanding and was in agreement with this plan. She also understands that She can call clinic at any time with any questions, concerns, or complaints.   I discussed the assessment and treatment plan with the patient. The patient was provided an opportunity to ask questions and all were answered. The patient agreed with the plan and demonstrated an understanding of the instructions.   The patient was advised to call back or seek an in-person evaluation if the symptoms worsen or if the condition fails to improve as anticipated.   I provided 15 minutes of non face-to-face telephone visit time during this encounter, and > 50% was spent counseling as documented under my assessment & plan.   Thank you for allowing me to participate in the care of this very pleasant patient.   Beckey Rutter, DNP, AGNP-[facility]: Dr. Rogue Bussing

## 2019-08-02 ENCOUNTER — Other Ambulatory Visit: Payer: Self-pay

## 2019-08-03 ENCOUNTER — Other Ambulatory Visit: Payer: Self-pay

## 2019-08-03 ENCOUNTER — Encounter: Payer: Self-pay | Admitting: Nurse Practitioner

## 2019-08-03 ENCOUNTER — Inpatient Hospital Stay: Payer: Self-pay

## 2019-08-03 DIAGNOSIS — C482 Malignant neoplasm of peritoneum, unspecified: Secondary | ICD-10-CM

## 2019-08-03 LAB — BASIC METABOLIC PANEL
Anion gap: 8 (ref 5–15)
BUN: 15 mg/dL (ref 8–23)
CO2: 28 mmol/L (ref 22–32)
Calcium: 9.2 mg/dL (ref 8.9–10.3)
Chloride: 102 mmol/L (ref 98–111)
Creatinine, Ser: 0.6 mg/dL (ref 0.44–1.00)
GFR calc Af Amer: >60 mL/min (ref >60)
GFR calc non Af Amer: >60 mL/min (ref >60)
Glucose, Bld: 131 mg/dL — ABNORMAL HIGH (ref 70–99)
Potassium: 3.9 mmol/L (ref 3.5–5.1)
Sodium: 138 mmol/L (ref 135–145)

## 2019-08-03 LAB — CBC WITH DIFFERENTIAL/PLATELET
Abs Immature Granulocytes: 0.01 10*3/uL (ref 0.00–0.07)
Basophils Absolute: 0 10*3/uL (ref 0.0–0.1)
Basophils Relative: 0 %
Eosinophils Absolute: 0 10*3/uL (ref 0.0–0.5)
Eosinophils Relative: 1 %
HCT: 34.6 % — ABNORMAL LOW (ref 36.0–46.0)
Hemoglobin: 11 g/dL — ABNORMAL LOW (ref 12.0–15.0)
Immature Granulocytes: 0 %
Lymphocytes Relative: 37 %
Lymphs Abs: 1 10*3/uL (ref 0.7–4.0)
MCH: 28.6 pg (ref 26.0–34.0)
MCHC: 31.8 g/dL (ref 30.0–36.0)
MCV: 89.9 fL (ref 80.0–100.0)
Monocytes Absolute: 0.1 10*3/uL (ref 0.1–1.0)
Monocytes Relative: 5 %
Neutro Abs: 1.5 10*3/uL — ABNORMAL LOW (ref 1.7–7.7)
Neutrophils Relative %: 57 %
Platelets: 92 10*3/uL — ABNORMAL LOW (ref 150–400)
RBC: 3.85 MIL/uL — ABNORMAL LOW (ref 3.87–5.11)
RDW: 15.2 % (ref 11.5–15.5)
WBC: 2.7 10*3/uL — ABNORMAL LOW (ref 4.0–10.5)
nRBC: 0 % (ref 0.0–0.2)

## 2019-08-04 ENCOUNTER — Other Ambulatory Visit: Payer: Self-pay | Admitting: Internal Medicine

## 2019-08-04 ENCOUNTER — Telehealth: Payer: Self-pay | Admitting: Nurse Practitioner

## 2019-08-04 NOTE — Telephone Encounter (Signed)
Called patient and reviewed and discussed results of bloodwork. Slightly neutropenic and slight thrombocytopenia. Asymptomatic & feeling well. Will continue to monitor.

## 2019-08-07 ENCOUNTER — Telehealth: Payer: Self-pay

## 2019-08-07 NOTE — Telephone Encounter (Signed)
Received notification that [facility] no longer accepts biopsy specimens for [employer] products.   "Our data has shown that a majority of these samples fail, causing extended delays in receiving results."  Will send germline and HRD to [facility] at appointment on 08/14/19.

## 2019-08-14 ENCOUNTER — Inpatient Hospital Stay: Payer: Self-pay

## 2019-08-14 ENCOUNTER — Encounter: Payer: Self-pay | Admitting: Internal Medicine

## 2019-08-14 ENCOUNTER — Inpatient Hospital Stay (HOSPITAL_BASED_OUTPATIENT_CLINIC_OR_DEPARTMENT_OTHER): Payer: Self-pay | Admitting: Internal Medicine

## 2019-08-14 ENCOUNTER — Other Ambulatory Visit: Payer: Self-pay

## 2019-08-14 VITALS — HR 90 | Resp 18

## 2019-08-14 VITALS — BP 107/63 | HR 103 | Temp 97.4°F | Wt 118.4 lb

## 2019-08-14 DIAGNOSIS — C482 Malignant neoplasm of peritoneum, unspecified: Secondary | ICD-10-CM

## 2019-08-14 DIAGNOSIS — Z95828 Presence of other vascular implants and grafts: Secondary | ICD-10-CM

## 2019-08-14 DIAGNOSIS — R634 Abnormal weight loss: Secondary | ICD-10-CM

## 2019-08-14 LAB — CBC WITH DIFFERENTIAL/PLATELET
Abs Immature Granulocytes: 0.02 10*3/uL (ref 0.00–0.07)
Basophils Absolute: 0 10*3/uL (ref 0.0–0.1)
Basophils Relative: 0 %
Eosinophils Absolute: 0 10*3/uL (ref 0.0–0.5)
Eosinophils Relative: 0 %
HCT: 36.6 % (ref 36.0–46.0)
Hemoglobin: 11.6 g/dL — ABNORMAL LOW (ref 12.0–15.0)
Immature Granulocytes: 1 %
Lymphocytes Relative: 34 %
Lymphs Abs: 1.1 10*3/uL (ref 0.7–4.0)
MCH: 29.3 pg (ref 26.0–34.0)
MCHC: 31.7 g/dL (ref 30.0–36.0)
MCV: 92.4 fL (ref 80.0–100.0)
Monocytes Absolute: 0.3 10*3/uL (ref 0.1–1.0)
Monocytes Relative: 8 %
Neutro Abs: 1.8 10*3/uL (ref 1.7–7.7)
Neutrophils Relative %: 57 %
Platelets: 176 10*3/uL (ref 150–400)
RBC: 3.96 MIL/uL (ref 3.87–5.11)
RDW: 17.6 % — ABNORMAL HIGH (ref 11.5–15.5)
WBC: 3.2 10*3/uL — ABNORMAL LOW (ref 4.0–10.5)
nRBC: 0 % (ref 0.0–0.2)

## 2019-08-14 LAB — COMPREHENSIVE METABOLIC PANEL
ALT: 35 U/L (ref 0–44)
AST: 30 U/L (ref 15–41)
Albumin: 3.9 g/dL (ref 3.5–5.0)
Alkaline Phosphatase: 103 U/L (ref 38–126)
Anion gap: 9 (ref 5–15)
BUN: 17 mg/dL (ref 8–23)
CO2: 26 mmol/L (ref 22–32)
Calcium: 9.3 mg/dL (ref 8.9–10.3)
Chloride: 103 mmol/L (ref 98–111)
Creatinine, Ser: 0.7 mg/dL (ref 0.44–1.00)
GFR calc Af Amer: >60 mL/min (ref >60)
GFR calc non Af Amer: >60 mL/min (ref >60)
Glucose, Bld: 162 mg/dL — ABNORMAL HIGH (ref 70–99)
Potassium: 3.7 mmol/L (ref 3.5–5.1)
Sodium: 138 mmol/L (ref 135–145)
Total Bilirubin: 0.6 mg/dL (ref 0.3–1.2)
Total Protein: 7.1 g/dL (ref 6.5–8.1)

## 2019-08-14 MED ORDER — FAMOTIDINE IN NACL 20-0.9 MG/50ML-% IV SOLN
20.0000 mg | Freq: Once | INTRAVENOUS | Status: AC
  Administered 2019-08-14: 10:00:00 20 mg via INTRAVENOUS
  Filled 2019-08-14: qty 50

## 2019-08-14 MED ORDER — DIPHENHYDRAMINE HCL 50 MG/ML IJ SOLN
50.0000 mg | Freq: Once | INTRAMUSCULAR | Status: AC
  Administered 2019-08-14: 50 mg via INTRAVENOUS
  Filled 2019-08-14: qty 1

## 2019-08-14 MED ORDER — SODIUM CHLORIDE 0.9% FLUSH
10.0000 mL | Freq: Once | INTRAVENOUS | Status: AC
  Administered 2019-08-14: 10 mL via INTRAVENOUS
  Filled 2019-08-14: qty 10

## 2019-08-14 MED ORDER — SODIUM CHLORIDE 0.9 % IV SOLN
567.0000 mg | Freq: Once | INTRAVENOUS | Status: AC
  Administered 2019-08-14: 15:00:00 570 mg via INTRAVENOUS
  Filled 2019-08-14: qty 57

## 2019-08-14 MED ORDER — SODIUM CHLORIDE 0.9 % IV SOLN
Freq: Once | INTRAVENOUS | Status: AC
  Filled 2019-08-14: qty 250

## 2019-08-14 MED ORDER — SODIUM CHLORIDE 0.9 % IV SOLN
175.0000 mg/m2 | Freq: Once | INTRAVENOUS | Status: AC
  Administered 2019-08-14: 11:00:00 288 mg via INTRAVENOUS
  Filled 2019-08-14: qty 48

## 2019-08-14 MED ORDER — SODIUM CHLORIDE 0.9 % IV SOLN
Freq: Once | INTRAVENOUS | Status: AC
  Filled 2019-08-14: qty 5

## 2019-08-14 MED ORDER — HEPARIN SOD (PORK) LOCK FLUSH 100 UNIT/ML IV SOLN
500.0000 [IU] | Freq: Once | INTRAVENOUS | Status: AC | PRN
  Administered 2019-08-14: 16:00:00 500 [IU]
  Filled 2019-08-14: qty 5

## 2019-08-14 MED ORDER — PALONOSETRON HCL INJECTION 0.25 MG/5ML
0.2500 mg | Freq: Once | INTRAVENOUS | Status: AC
  Administered 2019-08-14: 10:00:00 0.25 mg via INTRAVENOUS
  Filled 2019-08-14: qty 5

## 2019-08-14 NOTE — Progress Notes (Signed)
Spoke with Amanda Pena and her daughter regarding genetic testing. Since [facility] is no longer providing somatic testing on biopsies we are transferring her genetic testing to [facility]. We will send somatic and germline together at her Aug 30, 2019 gyn onc appointment. All questions answered. 

## 2019-08-14 NOTE — Assessment & Plan Note (Addendum)
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 CEA -elevated at 600+. On  Carbo-taxol every 3 weeks.  STABLE.   # Proceed with Carbo-taxol # 3; Labs today reviewed;  acceptable for treatment today. Will proceed with CT C/A/P after this cycle.  Ca125 improving.  We will also coordinate his appointment with the GYN [currently plan on February 10].  Discussed with Christy.   # Weight loss-multifactorial-chemotherapy/underlying malignancy/ Anxiety.Referral to [facility]; STABLE.   # skin rash-grade 1? Taxol vs other- as needed Kenalog.  #Diarrhea-secondary chemotherapy-improved stable.  #S/p mechanical fall-no concern for neuropathy.  Monitor for now  #Genetic testing/HRD-discussed with [facility].  Will speak to patient family.  # DISPOSITION: # Carbo-Taxol today; # Referral to [facility] re: weight loss-  # in 3 weeks- MD; labs- cbc/cmp; ca-125- Carbo-Taxol; CT C/A/P- Dr.B

## 2019-08-14 NOTE — Progress Notes (Signed)
[facility] CONSULT NOTE  Patient Care Team: Plotnikov, [name], MD as PCP - General (Internal Medicine) Arvella Nigh, MD as Consulting Physician (Obstetrics and Gynecology) Clent Jacks, RN as Oncology Nurse Navigator  CHIEF COMPLAINTS/PURPOSE OF CONSULTATION: Peritoneal carcinomatosis  #  Oncology History Overview Note  #November 2020-omental caking/peritoneal carcinomatosis; bilateral adnexal masses 4-5 cm in size; multiple lung nodules;   # dec 8th 2020-omental biopsy; positive for high-grade serous adenocarcinoma; GYN origin.  CT scan chest-bilateral lung nodules; 3.8 cm soft tissue mass adjacent to GE junction  # DEC 11TH 2020-CARBO-Taxol [chemo consent]  # #Genetic counseling-referral.   # Thyroid cancer at 26y s/p thyroidectomy [no RAIU]  # NGS/MOLECULAR TESTS: P  # PALLIATIVE CARE EVALUATION:P  # PAIN MANAGEMENT: P   DIAGNOSIS: Bilateral ovarian cancer  STAGE: IV        ;  GOALS: Control  CURRENT/MOST RECENT THERAPY : Carbotaxol [C]    Peritoneal carcinoma ([place])  06/23/2019 Initial Diagnosis   Peritoneal carcinoma ([place])   06/30/2019 -  Chemotherapy   The patient had palonosetron (ALOXI) injection 0.25 mg, 0.25 mg, Intravenous,  Once, 3 of 6 cycles Administration: 0.25 mg (06/30/2019), 0.25 mg (07/24/2019) CARBOplatin (PARAPLATIN) 570 mg in sodium chloride 0.9 % 250 mL chemo infusion, 570 mg (100 % of original dose 567 mg), Intravenous,  Once, 3 of 6 cycles Dose modification:   (original dose 567 mg, Cycle 1) Administration: 570 mg (06/30/2019), 570 mg (07/24/2019) fosaprepitant (EMEND) 150 mg in sodium chloride 0.9 % 145 mL IVPB, 150 mg, Intravenous,  Once, 1 of 1 cycle PACLitaxel (TAXOL) 288 mg in sodium chloride 0.9 % 250 mL chemo infusion (> 25m/m2), 175 mg/m2 = 288 mg, Intravenous,  Once, 3 of 6 cycles Administration: 288 mg (06/30/2019), 288 mg (07/24/2019) fosaprepitant (EMEND) 150 mg, dexamethasone (DECADRON) 12 mg in sodium chloride 0.9  % 145 mL IVPB, , Intravenous,  Once, 2 of 5 cycles Administration:  (07/24/2019)  for chemotherapy treatment.       HISTORY OF PRESENTING ILLNESS:  Amanda Pazos89y.o.  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    Patient is currently status post 2 cycles. Patient noted to have mild rash in the back of the head.  No itching.  Slight improvement with Kenalog.  Not resolved.  No nausea no vomiting.  No diarrhea.  No worsening constipation.  Continued chronic abdominal bloating.  Not any worse.  Review of Systems  Constitutional: Positive for malaise/fatigue. Negative for chills, diaphoresis, fever and weight loss.  HENT: Negative for nosebleeds and sore throat.   Eyes: Negative for double vision.  Respiratory: Negative for cough, hemoptysis, sputum production, shortness of breath and wheezing.   Cardiovascular: Negative for chest pain, palpitations, orthopnea and leg swelling.  Gastrointestinal: Positive for abdominal pain. Negative for blood in stool, heartburn, melena, nausea and vomiting.  Genitourinary: Negative for dysuria, frequency and urgency.  Musculoskeletal: Positive for back pain. Negative for joint pain.  Skin: Positive for rash. Negative for itching.  Neurological: Negative for dizziness, tingling, focal weakness, weakness and headaches.  Endo/Heme/Allergies: Does not bruise/bleed easily.  Psychiatric/Behavioral: Negative for depression. The patient is not nervous/anxious and does not have insomnia.      MEDICAL HISTORY:  Past Medical History:  Diagnosis Date  . [place]   . HYPERLIPIDEMIA 04/09/2008   Qualifier: Diagnosis of  By: YWynona Luna  . HYPOTHYROIDISM 04/09/2008   Qualifier: Diagnosis of  By: Wynona Luna   . INSOMNIA, CHRONIC 08/21/2010   Qualifier: Diagnosis of  By: Wynona Luna   . PERSONAL HISTORY MALIGNANT NEOPLASM THYROID 08/21/2010    Qualifier: Diagnosis of  By: Wynona Luna     SURGICAL HISTORY: Past Surgical History:  Procedure Laterality Date  . IR IMAGING GUIDED PORT INSERTION  07/18/2019  . THYROIDECTOMY, PARTIAL      SOCIAL HISTORY: Social History   Socioeconomic History  . Marital status: Married    Spouse name: Not on file  . Number of children: Not on file  . Years of education: Not on file  . Highest education level: Not on file  Occupational History  . Not on file  Tobacco Use  . Smoking status: Never Smoker  . Smokeless tobacco: Never Used  Substance and Sexual Activity  . Alcohol use: Yes  . Drug use: No  . Sexual activity: Yes  Other Topics Concern  . 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 Difficulty of Paying Living Expenses: Not on file  Food Insecurity:   . Worried About Charity fundraiser in the Last Year: Not on file  . Ran Out of Food in the Last Year: Not on file  Transportation Needs:   . Lack of Transportation (Medical): Not on file  . Lack of Transportation (Non-Medical): Not on file  Physical Activity:   . Days of Exercise per Week: Not on file  . Minutes of Exercise per Session: Not on file  Stress:   . Feeling of Stress : Not on file  Social Connections:   . Frequency of Communication with Friends and Family: Not on file  . Frequency of Social Gatherings with Friends and Family: Not on file  . Attends Religious Services: Not on file  . Active Member of Clubs or Organizations: Not on file  . Attends Archivist Meetings: Not on file  . Marital Status: Not on file  Intimate Partner Violence:   . Fear of Current or Ex-Partner: Not on file  . Emotionally Abused: Not on file  . Physically Abused: Not on file  . Sexually Abused: Not on file     FAMILY HISTORY: Family History  Problem Relation Age of Onset  . Arthritis Mother   . Hyperlipidemia Mother   . Hyperlipidemia Father   . Diabetes Father   . Hypertension Father   . Heart disease Father 43       CAD  . Lung cancer Father   . Breast cancer Maternal Grandmother        in 53s    ALLERGIES:  has No Known Allergies.  MEDICATIONS:  Current Outpatient Medications  Medication Sig Dispense Refill  . ALPRAZolam (XANAX) 0.5 MG tablet TAKE 1 TABLET BY MOUTH THREE TIMES A DAY AS NEEDED FOR ANXIETY 90 tablet 0  . Ascorbic Acid (VITAMIN C) 1000 MG tablet Take 1,000 mg by mouth daily.    Marland Kitchen HYDROcodone-acetaminophen (NORCO/VICODIN) 5-325 MG tablet Take 1 tablet by mouth every 6 (six) hours as needed for severe pain. 45 tablet 0  . lidocaine-prilocaine (EMLA) cream Apply generously to the port area 45-60 mins prior. 30 g 0  . Omega-3 Fatty Acids (FISH OIL) 1000 MG CAPS Take 2 capsules by mouth  daily.    . ondansetron (ZOFRAN) 8 MG tablet One pill every 8 hours as needed for nausea/vomitting. 40 tablet 1  . Probiotic Product (PROBIOTIC-10) CHEW Chew 1 capsule by mouth.    . prochlorperazine (COMPAZINE) 10 MG tablet Take 1 tablet (10 mg total) by mouth every 6 (six) hours as needed for nausea or vomiting. 40 tablet 1  . promethazine (PHENERGAN) 25 MG tablet Take 1 tablet (25 mg total) by mouth every 6 (six) hours as needed for nausea or vomiting. 30 tablet 0  . sertraline (ZOLOFT) 50 MG tablet Take 1 tablet (50 mg total) by mouth daily. 30 tablet 3  . SYNTHROID 75 MCG tablet Take 1 tablet (75 mcg total) by mouth daily before breakfast. 90 tablet 3  . traMADol (ULTRAM) 50 MG tablet Take 1 tablet (50 mg total) by mouth every 6 (six) hours as needed for moderate pain or severe pain. 30 tablet 0  . triamcinolone cream (KENALOG) 0.1 % Apply 1 application topically 2 (two) times daily. 80 g 0  . vitamin E 1000 UNIT capsule Take 4,000 Units by mouth daily.    Marland Kitchen zolpidem (AMBIEN) 10 MG  tablet TAKE 1 TABLET BY MOUTH EVERY DAY AT BEDTIME AS NEEDED FOR SLEEP 30 tablet 3   No current facility-administered medications for this visit.   Facility-Administered Medications Ordered in Other Visits  Medication Dose Route Frequency Provider Last Rate Last Admin  . 0.9 %  sodium chloride infusion   Intravenous Once Charlaine Dalton R, MD      . CARBOplatin (PARAPLATIN) 570 mg in sodium chloride 0.9 % 250 mL chemo infusion  570 mg Intravenous Once Charlaine Dalton R, MD      . diphenhydrAMINE (BENADRYL) injection 50 mg  50 mg Intravenous Once Charlaine Dalton R, MD      . famotidine (PEPCID) IVPB 20 mg premix  20 mg Intravenous Once Charlaine Dalton R, MD      . fosaprepitant (EMEND) 150 mg, dexamethasone (DECADRON) 12 mg in sodium chloride 0.9 % 145 mL IVPB   Intravenous Once Charlaine Dalton R, MD      . heparin lock flush 100 unit/mL  500 Units Intracatheter Once PRN Cammie Sickle, MD      . PACLitaxel (TAXOL) 288 mg in sodium chloride 0.9 % 250 mL chemo infusion (> 32m/m2)  175 mg/m2 (Treatment Plan Recorded) Intravenous Once BCharlaine DaltonR, MD      . palonosetron (ALOXI) injection 0.25 mg  0.25 mg Intravenous Once BCammie Sickle MD        PHYSICAL EXAMINATION: ECOG PERFORMANCE STATUS: 1 - Symptomatic but completely ambulatory  Vitals:   08/14/19 0835  BP: 107/63  Pulse: (!) 103  Temp: (!) 97.4 F (36.3 C)   Filed Weights   08/14/19 0835  Weight: 118 lb 6 oz (53.7 kg)    Physical Exam  Constitutional: She is oriented to person, place, and time and well-developed, well-nourished, and in no distress.  Accompanied by daughter.  Walking independently.  HENT:  Head: Normocephalic and atraumatic.  Mouth/Throat: Oropharynx is clear and moist. No oropharyngeal exudate.  Eyes: Pupils are equal, round, and reactive to light.  Cardiovascular: Normal rate and regular rhythm.  Pulmonary/Chest: Effort normal and breath sounds normal. No  respiratory distress. She has no wheezes.  Abdominal: Soft. Bowel sounds are normal. She exhibits distension. She exhibits no mass. There is no abdominal tenderness. There is no rebound and no guarding.  Musculoskeletal:  General: No tenderness or edema. Normal range of motion.     Cervical back: Normal range of motion and neck supple.  Neurological: She is alert and oriented to person, place, and time.  Skin: Skin is warm.  Mild maculopapular rash back of the head.  Psychiatric: Affect normal.     LABORATORY DATA:  I have reviewed the data as listed Lab Results  Component Value Date   WBC 3.2 (L) 08/14/2019   HGB 11.6 (L) 08/14/2019   HCT 36.6 08/14/2019   MCV 92.4 08/14/2019   PLT 176 08/14/2019   Recent Labs    06/13/19 1517 06/21/19 1024 07/03/19 1110 07/10/19 1110 07/24/19 0829 08/03/19 1025 08/14/19 0821  NA 139   < > 136   < > 138 138 138  K 3.9   < > 4.1   < > 3.8 3.9 3.7  CL 102   < > 98   < > 103 102 103  CO2 30   < > 28   < > 25 28 26   GLUCOSE 117*   < > 116*   < > 112* 131* 162*  BUN 20   < > 13   < > 18 15 17   CREATININE 0.89   < > 0.67   < > 0.64 0.60 0.70  CALCIUM 9.1   < > 8.7*   < > 9.3 9.2 9.3  GFRNONAA  --    < > >60   < > >60 >60 >60  GFRAA  --    < > >60   < > >60 >60 >60  PROT 6.9   < > 6.9  --  7.5  --  7.1  ALBUMIN 3.7   < > 2.9*  --  4.2  --  3.9  AST 31   < > 48*  --  27  --  30  ALT 22   < > 43  --  29  --  35  ALKPHOS 120*   < > 212*  --  106  --  103  BILITOT 0.4   < > 0.5  --  0.7  --  0.6  BILIDIR 0.0  --   --   --   --   --   --    < > = values in this interval not displayed.    RADIOGRAPHIC STUDIES: I have personally reviewed the radiological images as listed and agreed with the findings in the report. IR IMAGING GUIDED PORT INSERTION  Result Date: 07/18/2019 CLINICAL DATA:  Metastatic ovarian carcinoma, needs durable venous access for chemotherapy. EXAM: TUNNELED PORT CATHETER PLACEMENT WITH ULTRASOUND AND FLUOROSCOPIC  GUIDANCE FLUOROSCOPY TIME:  0.3 minute; 117  uGym2 DAP ANESTHESIA/SEDATION: Intravenous Fentanyl 136mg and Versed 314mwere administered as conscious sedation during continuous monitoring of the patient's level of consciousness and physiological / cardiorespiratory status by the radiology RN, with a total moderate sedation time of 18 minutes. TECHNIQUE: The procedure, risks, benefits, and alternatives were explained to the patient. Questions regarding the procedure were encouraged and answered. The patient understands and consents to the procedure. As antibiotic prophylaxis, cefazolin 2 g was ordered pre-procedure and administered intravenously within one hour of incision. Patency of the right IJ vein was confirmed with ultrasound with image documentation. An appropriate skin site was determined. Skin site was marked. Region was prepped using maximum barrier technique including cap and mask, sterile gown, sterile gloves, large sterile sheet, and Chlorhexidine as cutaneous antisepsis. The region was infiltrated locally with  1% lidocaine. Under real-time ultrasound guidance, the right IJ vein was accessed with a 21 gauge micropuncture needle; the needle tip within the vein was confirmed with ultrasound image documentation. Needle was exchanged over a 018 guidewire for transitional dilator, and vascular measurement was performed. A small incision was made on the right anterior chest wall and a subcutaneous pocket fashioned. The power-injectable port was positioned and its catheter tunneled to the right IJ dermatotomy site. The transitional dilator was exchanged over an Amplatz wire for a peel-away sheath, through which the port catheter, which had been trimmed to the appropriate length, was advanced and positioned under fluoroscopy with its tip at the cavoatrial junction. Spot chest radiograph confirms good catheter position and no pneumothorax. The port was flushed per protocol. The pocket was closed with deep  interrupted and subcuticular continuous 3-0 Monocryl sutures. The incisions were covered with Dermabond then covered with a sterile dressing. The patient tolerated the procedure well. COMPLICATIONS: COMPLICATIONS None immediate IMPRESSION: Technically successful right IJ power-injectable port catheter placement. Ready for routine use. Electronically Signed   By: Lucrezia Europe M.D.   On: 07/18/2019 10:52    ASSESSMENT & PLAN:   Peritoneal carcinoma ([place]) #Stage IV -peritoneal carcinomatosis-omental caking/bilateral adnexal masses-high-grade serous carcinoma; GYN origin; multiple lung nodules; 3.8 x 2.5 cm mass adjacent to GE junction. CEA -elevated at 600+. On  Carbo-taxol every 3 weeks.  STABLE.   # Proceed with Carbo-taxol # 3; Labs today reviewed;  acceptable for treatment today. Will proceed with CT C/A/P after this cycle.  Ca125 improving.  We will also coordinate his appointment with the GYN [currently plan on February 10].  Discussed with Christy.   # Weight loss-multifactorial-chemotherapy/underlying malignancy/ Anxiety.Referral to [facility]; STABLE.   # skin rash-grade 1? Taxol vs other- as needed Kenalog.  #Diarrhea-secondary chemotherapy-improved stable.  #S/p mechanical fall-no concern for neuropathy.  Monitor for now  #Genetic testing/HRD-discussed with [facility].  Will speak to patient family.  # DISPOSITION: # Carbo-Taxol today; # Referral to [facility] re: weight loss-  # in 3 weeks- MD; labs- cbc/cmp; ca-125- Carbo-Taxol; CT C/A/P- Dr.B   All questions were answered. The patient knows to call the clinic with any problems, questions or concerns.    Cammie Sickle, MD 08/14/2019 9:46 AM

## 2019-08-15 LAB — CA 125: Cancer Antigen (CA) 125: 32.2 U/mL (ref 0.0–38.1)

## 2019-08-16 ENCOUNTER — Other Ambulatory Visit: Payer: Self-pay | Admitting: Internal Medicine

## 2019-08-29 ENCOUNTER — Telehealth: Payer: Self-pay | Admitting: Internal Medicine

## 2019-08-29 ENCOUNTER — Other Ambulatory Visit: Payer: Self-pay

## 2019-08-29 ENCOUNTER — Ambulatory Visit
Admission: RE | Admit: 2019-08-29 | Discharge: 2019-08-29 | Disposition: A | Discharge status: Home / Self Care | Payer: Self-pay | Source: Ambulatory Visit | Attending: Internal Medicine | Admitting: Internal Medicine

## 2019-08-29 DIAGNOSIS — C482 Malignant neoplasm of peritoneum, unspecified: Secondary | ICD-10-CM | POA: Insufficient documentation

## 2019-08-29 MED ORDER — IOHEXOL 300 MG/ML  SOLN
100.0000 mL | Freq: Once | INTRAMUSCULAR | Status: AC | PRN
  Administered 2019-08-29: 100 mL via INTRAVENOUS

## 2019-08-29 NOTE — Telephone Encounter (Signed)
On 2/09- spoke to pt re: improved results of CT scan; await appt with gyn-onc as planned. GB

## 2019-08-30 ENCOUNTER — Inpatient Hospital Stay: Payer: Self-pay | Attending: Obstetrics and Gynecology | Admitting: Obstetrics and Gynecology

## 2019-08-30 ENCOUNTER — Other Ambulatory Visit: Payer: Self-pay

## 2019-08-30 ENCOUNTER — Inpatient Hospital Stay: Payer: Self-pay

## 2019-08-30 VITALS — BP 128/78 | HR 94 | Temp 97.6°F | Resp 18 | Wt 122.2 lb

## 2019-08-30 DIAGNOSIS — E039 Hypothyroidism, unspecified: Secondary | ICD-10-CM | POA: Insufficient documentation

## 2019-08-30 DIAGNOSIS — C786 Secondary malignant neoplasm of retroperitoneum and peritoneum: Secondary | ICD-10-CM | POA: Insufficient documentation

## 2019-08-30 DIAGNOSIS — Z9221 Personal history of antineoplastic chemotherapy: Secondary | ICD-10-CM | POA: Insufficient documentation

## 2019-08-30 DIAGNOSIS — Z79899 Other long term (current) drug therapy: Secondary | ICD-10-CM | POA: Insufficient documentation

## 2019-08-30 DIAGNOSIS — E785 Hyperlipidemia, unspecified: Secondary | ICD-10-CM | POA: Insufficient documentation

## 2019-08-30 DIAGNOSIS — C562 Malignant neoplasm of left ovary: Secondary | ICD-10-CM | POA: Insufficient documentation

## 2019-08-30 DIAGNOSIS — C482 Malignant neoplasm of peritoneum, unspecified: Secondary | ICD-10-CM

## 2019-08-30 NOTE — Progress Notes (Signed)
Gynecologic Oncology Consult Visit   Referring Provider: Dr. Alain Marion  Chief Complaint: High grade serous ovarian cancer, advanced stage  Subjective:  Amanda Pena is a 64 y.o. P1 post menopausal female, initially seen in consultation from Dr. Alain Marion for adnexal masses with carcinomatosis, who returns to clinic for follow up.   Here to consider interval debulking surgery.  CA125 has normalized after three cycles of carboplatin/taxol.  Today, she feels well and denies specific complaints.   History 06/14/19 CT scan abd/pelvis Uterus is normal in size. Heterogeneous soft tissue masses are seen in the adnexal regions bilaterally, measuring 4.3 x 3.6 cm on the left and 4.6 x 2.7 cm on the right. Diffuse omental soft tissue caking and diffuse peritoneal thickening are seen throughout the abdomen and pelvis as well as mild ascites. These findings are consistent with peritoneal carcinomatosis.  06/27/2019 CT Chest to complete staging showed multiple bilateral pulmonary nodules suspicious for metastatic disease larges measuring 1.0 cm in RLL. Soft tissue mass adjacent to GE junction suspicious for metastasis, right pleural effusion, right basilar atelectasis, ascites, CAD.   Omental mass was biopsied on 06/27/2019 which showed:  HIGH-GRADE SEROUS CARCINOMA OF GYNECOLOGIC ORIGIN.   In view of concern for lung metastases, she initiated carbo-taxol chemotherapy on 06/30/2019  Instead of undergoing primary debulking.  She has received 3 cycles.   After first cycle she experienced bone pain and bone scan was performed which was negative for osseous metastatic disease.   Port was placed for chemotherapy for patient preference on 07/18/2019.   08/29/2019- CT Chest/Abdomen/Pelvis for restaging 1. Interval response to therapy, decrease in peritoneal carcinomatosis with decreased tumor volume within the omentum. Previously noted bilateral enlarged ovaries have decreased in size from previous exam.  Thickened appendix identified along th eright pericolic gutter has decreased in caliber in the interval.  2. Interval improvement in previously noted right lower lobe pulmonary nodularity which is favored to represent sequelae of inflammatory or infectious process. A few persistent, stable milli metric lung nodules are nonspecific but likely represent a benign process. Attention on follow-up imaging advised.    CA -125 was elevated at time of diagnosis and has been followed:  06/21/2019 628 07/24/19  145 08/14/19 32.2  CEA was 1.2 on 06/21/2019.   Gynecologic Oncology History:  05/08/19 Coronary calcium score of 97. This was 55 percentile for age and sex matched controls.  Also noted multiple pulmonary nodules up to 10 mm felt to be indeterminate and plan was to repeat scan in 6 months.    In November she complained of severe lower quadrant pain with abdominal distention/bloating and saw her PCP who ordered CT scan 11/26, which showed heterogeneous soft tissue masses bilateral adnexa, measuring 4.3 x 3.6 cm on left, 4.6 x 2.7 cm on right. Diffuse omental soft tissue caking and diffuse peritoneal thickening are seen throughout the abdomen and pelvis with mild ascites. Findings consistent with peritoneal carcinomatosis.  Additionally, multiple pulmonary nodules in right lower lobe largest measuring 12 mm.   She continues to be very active and plays tennis.  Here with daughter today.    Menopause age 48.  No HRT. Last pap- 07/26/2017- NILM  She complained of night sweats, intermittent constipation, and chronic back pain at initial visit. She takes multiple supplements.   Problem List: Patient Active Problem List   Diagnosis Date Noted  . Peritoneal carcinoma ([place]) 06/23/2019  . Ovarian cancer, bilateral ([place]) 06/23/2019  . Abdominal pain 06/13/2019  . LLQ abdominal pain 06/13/2019  .  Calcific bursitis of shoulder 01/29/2016  . Cough 10/15/2014  . Cervical pain (neck) 10/15/2014  . Situational  anxiety 03/07/2014  . Goals of care, counseling/discussion 02/10/2013  . 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 Preventative health care 11/09/2011  . INSOMNIA, CHRONIC 08/21/2010  . PERSONAL HISTORY MALIGNANT NEOPLASM THYROID 08/21/2010  . Hypothyroidism 04/09/2008  . HYPERLIPIDEMIA 04/09/2008  . Sinusitis 04/09/2008    Past Medical History: Past Medical History:  Diagnosis Date  . Cancer ([place])   . HYPERLIPIDEMIA 04/09/2008   Qualifier: Diagnosis of  By: Wynona Luna   . HYPOTHYROIDISM 04/09/2008   Qualifier: Diagnosis of  By: Wynona Luna   . INSOMNIA, CHRONIC 08/21/2010   Qualifier: Diagnosis of  By: Wynona Luna   . PERSONAL HISTORY MALIGNANT NEOPLASM THYROID 08/21/2010   Qualifier: Diagnosis of  By: Wynona Luna     Past Surgical History: Past Surgical History:  Procedure Laterality Date  . IR IMAGING GUIDED PORT INSERTION  07/18/2019  . THYROIDECTOMY, PARTIAL      Family History: Family History  Problem Relation Age of Onset  . Arthritis Mother   . Hyperlipidemia Mother   . Hyperlipidemia Father   . Diabetes Father   . Hypertension Father   . Heart disease Father 52       CAD  . Lung cancer Father   . Breast cancer Maternal Grandmother        in 93s    Social History: Social History   Socioeconomic History  . Marital status: Married    Spouse name: Not on file  . Number of children: Not on file  . Years of education: Not on file  . Highest education level: Not on file  Occupational History  . Not on file  Tobacco Use  . Smoking status: Never Smoker  . Smokeless tobacco: Never Used  Substance and Sexual Activity  . Alcohol use: Yes  . Drug use: No  . Sexual activity: Yes  Other Topics Concern  . 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 Difficulty of Paying Living Expenses: Not on file  Food Insecurity:   . Worried About Charity fundraiser in the Last Year: Not on file  . Ran Out of Food in the Last Year: Not on file  Transportation Needs:   . Lack of Transportation (Medical): Not on file  . Lack of Transportation (Non-Medical): Not on file  Physical Activity:   . Days of Exercise per Week: Not on file  . Minutes of Exercise per Session: Not on file  Stress:   . Feeling of Stress : Not on file  Social Connections:   . Frequency of Communication with Friends and Family: Not on file  . Frequency of Social Gatherings with Friends and Family: Not on file  . Attends Religious Services: Not on file  . Active Member of Clubs or Organizations: Not on file  . Attends Archivist Meetings: Not on file  . Marital Status: Not on file  Intimate Partner Violence:   . Fear of Current or Ex-Partner: Not on file  . Emotionally Abused: Not on file  . Physically Abused: Not on file  . Sexually Abused: Not on file    Allergies: No  Known Allergies  Current Medications: Current Outpatient Medications  Medication Sig Dispense Refill  . ALPRAZolam (XANAX) 0.5 MG tablet TAKE 1 TABLET BY MOUTH THREE TIMES A DAY AS NEEDED FOR ANXIETY 90 tablet 0  . Ascorbic Acid (VITAMIN C) 1000 MG tablet Take 1,000 mg by mouth daily.    Marland Kitchen lidocaine-prilocaine (EMLA) cream Apply generously to the port area 45-60 mins prior. 30 g 0  . ondansetron (ZOFRAN) 8 MG tablet One pill every 8 hours as needed for nausea/vomitting. 40 tablet 1  . Probiotic Product (PROBIOTIC-10) CHEW Chew 1 capsule by mouth.    . prochlorperazine (COMPAZINE) 10 MG tablet Take 1 tablet (10 mg total) by mouth every 6 (six) hours as needed for nausea or vomiting. 40 tablet 1  . promethazine (PHENERGAN) 25 MG tablet Take 1 tablet (25 mg total) by mouth every 6 (six) hours as needed for nausea or vomiting. 30 tablet 0  . sertraline (ZOLOFT)  50 MG tablet TAKE 1 TABLET BY MOUTH EVERY DAY 90 tablet 2  . SYNTHROID 75 MCG tablet Take 1 tablet (75 mcg total) by mouth daily before breakfast. 90 tablet 3  . vitamin E 1000 UNIT capsule Take 4,000 Units by mouth daily.    Marland Kitchen HYDROcodone-acetaminophen (NORCO/VICODIN) 5-325 MG tablet Take 1 tablet by mouth every 6 (six) hours as needed for severe pain. (Patient not taking: Reported on 08/30/2019) 45 tablet 0  . Omega-3 Fatty Acids (FISH OIL) 1000 MG CAPS Take 2 capsules by mouth daily.    . traMADol (ULTRAM) 50 MG tablet Take 1 tablet (50 mg total) by mouth every 6 (six) hours as needed for moderate pain or severe pain. (Patient not taking: Reported on 08/30/2019) 30 tablet 0  . triamcinolone cream (KENALOG) 0.1 % Apply 1 application topically 2 (two) times daily. (Patient not taking: Reported on 08/30/2019) 80 g 0  . zolpidem (AMBIEN) 10 MG tablet TAKE 1 TABLET BY MOUTH EVERY DAY AT BEDTIME AS NEEDED FOR SLEEP (Patient not taking: Reported on 08/30/2019) 30 tablet 3   No current facility-administered medications for this visit.   Review of Systems General:  no complaints Skin: no complaints Eyes: no complaints HEENT: no complaints Breasts: no complaints Pulmonary: no complaints Cardiac: no complaints Gastrointestinal: no complaints Genitourinary/Sexual: no complaints Ob/Gyn: no complaints Musculoskeletal: no complaints Hematology: no complaints Neurologic/Psych: no complaints  Objective:  Physical Examination:  BP 128/78 (BP Location: Right Arm, Patient Position: Sitting)   Pulse 94   Temp 97.6 F (36.4 C) (Tympanic)   Resp 18   Wt 122 lb 3.2 oz (55.4 kg)   BMI 19.72 kg/m     ECOG Performance Status: 1 - Symptomatic but completely ambulatory  GENERAL: Patient is a well appearing female in no acute distress HEENT:  PERRL, neck supple with midline trachea. Thyroid without masses.  NODES:  No cervical, supraclavicular, axillary, or inguinal lymphadenopathy palpated.  LUNGS:   Clear to auscultation bilaterally.  No wheezes or rhonchi. HEART:  Regular rate and rhythm. No murmur appreciated. ABDOMEN:  Soft, nontender.  Positive, normoactive bowel sounds.  MSK:  No focal spinal tenderness to palpation. Full range of motion bilaterally in the upper extremities. EXTREMITIES:  No peripheral edema.   SKIN:  Clear with no obvious rashes or skin changes. No nail dyscrasia. NEURO:  Nonfocal. Well oriented.  Appropriate affect.  Pelvic: EGBUS: no lesions Cervix: no lesions, nontender, mobile Vagina: no lesions, no discharge or bleeding Uterus: normal size, nontender, mobile Adnexa: no palpable masses Rectovaginal: some subtle  nodularity in the cul de sac.    Radiologic Imaging: 06/16/19 - CT showed: heterogeneous soft tissue masses bilateral adnexa, measuring 4.3 x 3.6 cm on left, 4.6 x 2.7 cm on right. Diffuse omental soft tissue caking and diffuse peritoneal thickening are seen throughout the abdomen and pelvis with mild ascites. Findings consistent with peritoneal carcinomatosis. Additionally, multiple pulmonary nodules in right lower lobe largest measuring 12 mm.   CBC    Component Value Date/Time   WBC 3.2 (L) 08/14/2019 0821   RBC 3.96 08/14/2019 0821   HGB 11.6 (L) 08/14/2019 0821   HCT 36.6 08/14/2019 0821   PLT 176 08/14/2019 0821   MCV 92.4 08/14/2019 0821   MCH 29.3 08/14/2019 0821   MCHC 31.7 08/14/2019 0821   RDW 17.6 (H) 08/14/2019 0821   LYMPHSABS 1.1 08/14/2019 0821   MONOABS 0.3 08/14/2019 0821   EOSABS 0.0 08/14/2019 0821   BASOSABS 0.0 08/14/2019 0821   CMP Latest Ref Rng & Units 08/14/2019 08/03/2019 07/24/2019  Glucose 70 - 99 mg/dL 162(H) 131(H) 112(H)  BUN 8 - 23 mg/dL '17 15 18  '$ Creatinine 0.44 - 1.00 mg/dL 0.70 0.60 0.64  Sodium 135 - 145 mmol/L 138 138 138  Potassium 3.5 - 5.1 mmol/L 3.7 3.9 3.8  Chloride 98 - 111 mmol/L 103 102 103  CO2 22 - 32 mmol/L '26 28 25  '$ Calcium 8.9 - 10.3 mg/dL 9.3 9.2 9.3  Total Protein 6.5 - 8.1 g/dL 7.1 -  7.5  Total Bilirubin 0.3 - 1.2 mg/dL 0.6 - 0.7  Alkaline Phos 38 - 126 U/L 103 - 106  AST 15 - 41 U/L 30 - 27  ALT 0 - 44 U/L 35 - 29      Assessment:  Layce Sprung is a 64 y.o. female diagnosed with high grade serous cancer of probable ovarian/tubal origin.  Thought initially to have lung metastases and treated with three cycles of neoadjuvant chemotherapy. CA125 has regressed to normal and CT scan shows minimal residual disease.  She has an excellent performance status.    Medical co-morbidities complicating care: not significant Plan:   Problem List Items Addressed This Visit      Other   Peritoneal carcinoma ([place]) - Primary     We discussed  interval debulking surgery and she would like to have this done at [facility].  Discussed that we might be able to do this laparoscopically, but that if laparotomy was needed to achieve complete debulking we would do this.  Three additional cycles of chemotherapy then will be given after surgery. We also discussed the need for genetic testing to determine if BRCA1/2 mutation or HRD present that would suggest major benefit for maintenance PARP inhibitor.  This was ordered today.      The patient's diagnosis, an outline of the further diagnostic and laboratory studies which will be required, the recommendation for surgery, and alternatives were discussed with her and her accompanying family members.  All questions were answered to their satisfaction.  A total of 30 minutes were spent with the patient/family today; 50 % was spent in education, counseling and coordination of care for  Probable ovarian cancer.    Mellody Drown, MD  CC:  Plotnikov, [name], MD [address] [place],  [place] [address] [phone]

## 2019-08-30 NOTE — Progress Notes (Signed)
Pt has had 3 chemo treatments.  States finds out today if ready for surgery. Appetite improving.

## 2019-09-01 ENCOUNTER — Ambulatory Visit: Payer: Self-pay

## 2019-09-04 ENCOUNTER — Ambulatory Visit: Payer: Self-pay | Admitting: Internal Medicine

## 2019-09-04 ENCOUNTER — Ambulatory Visit: Payer: Self-pay

## 2019-09-04 ENCOUNTER — Other Ambulatory Visit: Payer: Self-pay

## 2019-09-08 ENCOUNTER — Telehealth: Payer: Self-pay

## 2019-09-08 NOTE — Telephone Encounter (Signed)
Called and spoke with Ms. Putnam regarding financial assistance from [employer]. She has spoken to [employer], at [employer], and provided the additional information needed.

## 2019-09-18 ENCOUNTER — Encounter: Payer: Self-pay | Admitting: Internal Medicine

## 2019-09-18 ENCOUNTER — Telehealth: Payer: Self-pay

## 2019-09-18 NOTE — Telephone Encounter (Signed)
Germline genetic testing has resulted. Results sent to HIM for scanning to record.

## 2019-09-27 ENCOUNTER — Other Ambulatory Visit: Payer: Self-pay

## 2019-09-27 ENCOUNTER — Inpatient Hospital Stay: Payer: Self-pay | Attending: Obstetrics and Gynecology | Admitting: Obstetrics and Gynecology

## 2019-09-27 VITALS — BP 113/68 | HR 86 | Temp 97.9°F | Resp 18 | Wt 116.0 lb

## 2019-09-27 DIAGNOSIS — Z90722 Acquired absence of ovaries, bilateral: Secondary | ICD-10-CM | POA: Insufficient documentation

## 2019-09-27 DIAGNOSIS — Z79899 Other long term (current) drug therapy: Secondary | ICD-10-CM | POA: Insufficient documentation

## 2019-09-27 DIAGNOSIS — Z8261 Family history of arthritis: Secondary | ICD-10-CM | POA: Insufficient documentation

## 2019-09-27 DIAGNOSIS — Z85850 Personal history of malignant neoplasm of thyroid: Secondary | ICD-10-CM | POA: Insufficient documentation

## 2019-09-27 DIAGNOSIS — C786 Secondary malignant neoplasm of retroperitoneum and peritoneum: Secondary | ICD-10-CM | POA: Insufficient documentation

## 2019-09-27 DIAGNOSIS — C562 Malignant neoplasm of left ovary: Secondary | ICD-10-CM | POA: Insufficient documentation

## 2019-09-27 DIAGNOSIS — Z803 Family history of malignant neoplasm of breast: Secondary | ICD-10-CM | POA: Insufficient documentation

## 2019-09-27 DIAGNOSIS — Z833 Family history of diabetes mellitus: Secondary | ICD-10-CM | POA: Insufficient documentation

## 2019-09-27 DIAGNOSIS — Z90710 Acquired absence of both cervix and uterus: Secondary | ICD-10-CM | POA: Insufficient documentation

## 2019-09-27 DIAGNOSIS — Z9079 Acquired absence of other genital organ(s): Secondary | ICD-10-CM | POA: Insufficient documentation

## 2019-09-27 DIAGNOSIS — Z8249 Family history of ischemic heart disease and other diseases of the circulatory system: Secondary | ICD-10-CM | POA: Insufficient documentation

## 2019-09-27 DIAGNOSIS — Z5111 Encounter for antineoplastic chemotherapy: Secondary | ICD-10-CM | POA: Insufficient documentation

## 2019-09-27 DIAGNOSIS — C7800 Secondary malignant neoplasm of unspecified lung: Secondary | ICD-10-CM | POA: Insufficient documentation

## 2019-09-27 DIAGNOSIS — E039 Hypothyroidism, unspecified: Secondary | ICD-10-CM | POA: Insufficient documentation

## 2019-09-27 DIAGNOSIS — C561 Malignant neoplasm of right ovary: Secondary | ICD-10-CM | POA: Insufficient documentation

## 2019-09-27 DIAGNOSIS — Z8349 Family history of other endocrine, nutritional and metabolic diseases: Secondary | ICD-10-CM | POA: Insufficient documentation

## 2019-09-27 DIAGNOSIS — C563 Malignant neoplasm of bilateral ovaries: Secondary | ICD-10-CM

## 2019-09-27 DIAGNOSIS — Z801 Family history of malignant neoplasm of trachea, bronchus and lung: Secondary | ICD-10-CM | POA: Insufficient documentation

## 2019-09-27 NOTE — Progress Notes (Signed)
Duplicate entry

## 2019-09-27 NOTE — Progress Notes (Signed)
Gynecologic Oncology Consult Visit   Referring Provider: Dr. Alain Marion  Chief Complaint: High grade serous ovarian cancer, advanced stage  Subjective:  Amanda Pena is a 64 y.o. P1 post menopausal female, initially seen in consultation from Dr. Alain Marion for adnexal masses with carcinomatosis, who returns to clinic for follow up.   CA125 normalized after three cycles of carboplatin/taxol. She had interval debulking surgery at [facility] 2/23 with TAH,BSO, omentectomy, appendectomy, rectosigmoid resection and reanastamosis, argon beam coagulation of diaphragm with no residual disease left behind.  She had an uneventful postoperative course.  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 Today, she feels well overall and has some alternating diarrhea and constipation.  No other specific complaints.   Germline genetic testing negative.  HRD back later this month.  Gynecologic Oncology History:  In November 2020 she complained of severe lower quadrant pain with abdominal distention/bloating and saw her PCP who ordered CT scan 11/26, which showed heterogeneous soft tissue masses bilateral adnexa, measuring 4.3 x 3.6 cm on left, 4.6 x 2.7 cm on right. Diffuse omental soft tissue caking and diffuse peritoneal thickening are seen throughout the abdomen and pelvis with mild ascites. Findings consistent with peritoneal carcinomatosis.  Additionally, multiple pulmonary nodules in right lower lobe largest measuring 12 mm.   Menopause age 60.  No HRT. Last pap- 07/26/2017- NILM.  She complained of night sweats, intermittent constipation, and chronic back pain at initial visit. She takes multiple supplements. 06/14/19 CT scan abd/pelvis Uterus is normal in size. Heterogeneous soft tissue masses are seen in the adnexal regions bilaterally, measuring 4.3 x 3.6 cm on the left and 4.6 x 2.7 cm on the right. Diffuse omental soft tissue caking and diffuse peritoneal thickening are seen throughout the abdomen and  pelvis as well as mild ascites. These findings are consistent with peritoneal carcinomatosis. [id number] on 06/21/19  06/27/2019 CT Chest to complete staging showed multiple bilateral pulmonary nodules suspicious for metastatic disease larges measuring 1.0 cm in RLL. Soft tissue mass adjacent to GE junction suspicious for metastasis, right pleural effusion, right basilar atelectasis, ascites, CAD.   Omental mass was biopsied on 06/27/2019 which showed:  HIGH-GRADE SEROUS CARCINOMA OF GYNECOLOGIC ORIGIN.   In view of concern for lung metastases, she initiated carbo-taxol chemotherapy on 06/30/2019  Instead of undergoing primary debulking.  She received 3 cycles. After first cycle she experienced bone pain and bone scan was performed which was negative for osseous metastatic disease. Port was placed for chemotherapy for patient preference on 07/18/2019.   08/29/2019- CT Chest/Abdomen/Pelvis for restaging 1. Interval response to therapy, decrease in peritoneal carcinomatosis with decreased tumor volume within the omentum. Previously noted bilateral enlarged ovaries have decreased in size from previous exam. Thickened appendix identified along th eright pericolic gutter has decreased in caliber in the interval.  2. Interval improvement in previously noted right lower lobe pulmonary nodularity which is favored to represent sequelae of inflammatory or infectious process. A few persistent, stable milli metric lung nodules are nonspecific but likely represent a benign process. Attention on follow-up imaging advised.   CA -125 was elevated at time of diagnosis and has been followed:  06/21/2019 628 07/24/19  145 08/14/19 32.2  CEA was 1.2 on 06/21/2019.   CA125 normalized after three cycles of carboplatin/taxol. She had interval debulking surgery at [facility] 2/23 with TAH,BSO, omentectomy, appendectomy, rectosigmoid resection and reanastamosis, argon beam coagulation of diaphragm with no residual disease left behind.   She had an uneventful postoperative course.  Pathology confirmed high grade  serous ovarian cancer.  Problem List: Patient Active Problem List   Diagnosis Date Noted  . Peritoneal carcinoma ([place]) 06/23/2019  . Ovarian cancer, bilateral ([place]) 06/23/2019  . Abdominal pain 06/13/2019  . LLQ abdominal pain 06/13/2019  . Calcific bursitis of shoulder 01/29/2016  . Cough 10/15/2014  . Cervical pain (neck) 10/15/2014  . Situational anxiety 03/07/2014  . Goals of care, counseling/discussion 02/10/2013  . 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 Preventative health care 11/09/2011  . INSOMNIA, CHRONIC 08/21/2010  . PERSONAL HISTORY MALIGNANT NEOPLASM THYROID 08/21/2010  . Hypothyroidism 04/09/2008  . HYPERLIPIDEMIA 04/09/2008  . Sinusitis 04/09/2008    Past Medical History: Past Medical History:  Diagnosis Date  . Cancer ([place])   . HYPERLIPIDEMIA 04/09/2008   Qualifier: Diagnosis of  By: Wynona Luna   . HYPOTHYROIDISM 04/09/2008   Qualifier: Diagnosis of  By: Wynona Luna   . INSOMNIA, CHRONIC 08/21/2010   Qualifier: Diagnosis of  By: Wynona Luna   . PERSONAL HISTORY MALIGNANT NEOPLASM THYROID 08/21/2010   Qualifier: Diagnosis of  By: Wynona Luna     Past Surgical History: Past Surgical History:  Procedure Laterality Date  . IR IMAGING GUIDED PORT INSERTION  07/18/2019  . THYROIDECTOMY, PARTIAL      Family History: Family History  Problem Relation Age of Onset  . Arthritis Mother   . Hyperlipidemia Mother   . Hyperlipidemia Father   . Diabetes Father   . Hypertension Father   . Heart disease Father 75       CAD  . Lung cancer Father   . Breast cancer Maternal Grandmother        in 87s    Social History: Social History   Socioeconomic History  . Marital status: Married    Spouse name: Not on file  . Number of children: Not on file  . Years of education: Not on file  . Highest education level: Not on file  Occupational History  . Not  on file  Tobacco Use  . Smoking status: Never Smoker  . Smokeless tobacco: Never Used  Substance and Sexual Activity  . Alcohol use: Yes  . Drug use: No  . Sexual activity: Yes  Other Topics Concern  . 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 Difficulty of Paying Living Expenses: Not on file  Food Insecurity:   . Worried About Charity fundraiser in the Last Year: Not on file  . Ran Out of Food in the Last Year: Not on file  Transportation Needs:   . Lack of Transportation (Medical): Not on file  . Lack of Transportation (Non-Medical): Not on file  Physical Activity:   . Days of Exercise per Week: Not on file  . Minutes of Exercise per Session: Not on file  Stress:   . Feeling of Stress : Not on file  Social Connections:   . Frequency of Communication with Friends and Family: Not on file  . Frequency of Social Gatherings with Friends and Family: Not on file  . Attends Religious Services: Not on file  . Active Member of Clubs or Organizations: Not on file  . Attends Archivist Meetings: Not on file  . Marital Status: Not on file  Intimate Partner Violence:   . Fear of Current or Ex-Partner: Not on file  . Emotionally Abused: Not on file  . Physically Abused: Not on file  . Sexually Abused: Not on file    Allergies: No Known Allergies  Current Medications: Current Outpatient Medications  Medication Sig Dispense Refill  . ALPRAZolam (XANAX) 0.5 MG tablet TAKE 1 TABLET BY MOUTH THREE TIMES A DAY AS NEEDED FOR ANXIETY 90 tablet 0  . Ascorbic Acid (VITAMIN C) 1000 MG tablet Take 1,000 mg by mouth daily.    Marland Kitchen HYDROcodone-acetaminophen (NORCO/VICODIN) 5-325 MG tablet Take 1 tablet by mouth every 6 (six) hours as needed for severe pain. (Patient not taking:  Reported on 08/30/2019) 45 tablet 0  . lidocaine-prilocaine (EMLA) cream Apply generously to the port area 45-60 mins prior. 30 g 0  . Omega-3 Fatty Acids (FISH OIL) 1000 MG CAPS Take 2 capsules by mouth daily.    . ondansetron (ZOFRAN) 8 MG tablet One pill every 8 hours as needed for nausea/vomitting. 40 tablet 1  . Probiotic Product (PROBIOTIC-10) CHEW Chew 1 capsule by mouth.    . prochlorperazine (COMPAZINE) 10 MG tablet Take 1 tablet (10 mg total) by mouth every 6 (six) hours as needed for nausea or vomiting. 40 tablet 1  . promethazine (PHENERGAN) 25 MG tablet Take 1 tablet (25 mg total) by mouth every 6 (six) hours as needed for nausea or vomiting. 30 tablet 0  . sertraline (ZOLOFT) 50 MG tablet TAKE 1 TABLET BY MOUTH EVERY DAY 90 tablet 2  . SYNTHROID 75 MCG tablet Take 1 tablet (75 mcg total) by mouth daily before breakfast. 90 tablet 3  . traMADol (ULTRAM) 50 MG tablet Take 1 tablet (50 mg total) by mouth every 6 (six) hours as needed for moderate pain or severe pain. (Patient not taking: Reported on 08/30/2019) 30 tablet 0  . triamcinolone cream (KENALOG) 0.1 % Apply 1 application topically 2 (two) times daily. (Patient not taking: Reported on 08/30/2019) 80 g 0  . vitamin E 1000 UNIT capsule Take 4,000 Units by mouth daily.    Marland Kitchen zolpidem (AMBIEN) 10 MG tablet TAKE 1 TABLET BY MOUTH EVERY DAY AT BEDTIME AS NEEDED FOR SLEEP (Patient not taking: Reported on 08/30/2019) 30 tablet 3   No current facility-administered medications for this visit.   Review of Systems General:  no complaints Skin: no complaints Eyes: no complaints HEENT: no complaints Breasts: no complaints Pulmonary: no complaints Cardiac: no complaints Gastrointestinal: no complaints Genitourinary/Sexual: no complaints Ob/Gyn: no complaints Musculoskeletal: no complaints Hematology: no complaints Neurologic/Psych: no complaints  Objective:  Physical Examination:  BP 113/68 (BP Location: Left Arm, Patient Position:  Sitting)   Pulse 86   Temp 97.9 F (36.6 C) (Tympanic)   Resp 18   Wt 116 lb (52.6 kg)   SpO2 100%   BMI 18.72 kg/m     ECOG Performance Status: 1 - Symptomatic but completely ambulatory  GENERAL: Patient is a well appearing female in no acute distress HEENT:  PERRL, neck supple with midline trachea. Thyroid without masses.  NODES:  No cervical, supraclavicular, axillary, or inguinal lymphadenopathy palpated.  LUNGS:  Clear to auscultation bilaterally.  No wheezes or rhonchi. HEART:  Regular rate and rhythm. No murmur appreciated. ABDOMEN:  Soft, nontender.  Positive, normoactive bowel sounds.  Incision healing well, steri strips in place.   MSK:  No focal spinal tenderness to palpation. Full range of motion bilaterally in the upper extremities. EXTREMITIES:  No peripheral edema.  SKIN:  Clear with no obvious rashes or skin changes. No nail dyscrasia. NEURO:  Nonfocal. Well oriented.  Appropriate affect.  Pelvic: EGBUS: no lesions Vagina: no lesions, cuff healing well. Bimanual: no palpable masses Rectovaginal: no masses or stool.  Cannot feel the anastomosis because it is higher in the pelvis.    Radiologic Imaging: 06/16/19 - CT showed: heterogeneous soft tissue masses bilateral adnexa, measuring 4.3 x 3.6 cm on left, 4.6 x 2.7 cm on right. Diffuse omental soft tissue caking and diffuse peritoneal thickening are seen throughout the abdomen and pelvis with mild ascites. Findings consistent with peritoneal carcinomatosis. Additionally, multiple pulmonary nodules in right lower lobe largest measuring 12 mm.   CBC    Component Value Date/Time   WBC 3.2 (L) 08/14/2019 0821   RBC 3.96 08/14/2019 0821   HGB 11.6 (L) 08/14/2019 0821   HCT 36.6 08/14/2019 0821   PLT 176 08/14/2019 0821   MCV 92.4 08/14/2019 0821   MCH 29.3 08/14/2019 0821   MCHC 31.7 08/14/2019 0821   RDW 17.6 (H) 08/14/2019 0821   LYMPHSABS 1.1 08/14/2019 0821   MONOABS 0.3 08/14/2019 0821   EOSABS 0.0  08/14/2019 0821   BASOSABS 0.0 08/14/2019 0821   CMP Latest Ref Rng & Units 08/14/2019 08/03/2019 07/24/2019  Glucose 70 - 99 mg/dL 162(H) 131(H) 112(H)  BUN 8 - 23 mg/dL '17 15 18  '$ Creatinine 0.44 - 1.00 mg/dL 0.70 0.60 0.64  Sodium 135 - 145 mmol/L 138 138 138  Potassium 3.5 - 5.1 mmol/L 3.7 3.9 3.8  Chloride 98 - 111 mmol/L 103 102 103  CO2 22 - 32 mmol/L '26 28 25  '$ Calcium 8.9 - 10.3 mg/dL 9.3 9.2 9.3  Total Protein 6.5 - 8.1 g/dL 7.1 - 7.5  Total Bilirubin 0.3 - 1.2 mg/dL 0.6 - 0.7  Alkaline Phos 38 - 126 U/L 103 - 106  AST 15 - 41 U/L 30 - 27  ALT 0 - 44 U/L 35 - 29      Assessment:  Amanda Pena is a 64 y.o. female diagnosed with high grade serous cancer of ovarian/tubal origin.  Thought initially to have lung metastases and treated with three cycles of neoadjuvant chemotherapy. Subsequent  CT suggested the lung findings may not have been lung mets.  CA125 regressed to normal and CT scan 1/21 showed minimal residual disease.  Underwent interval debulking 2/23 and had residual disease that required laparotomy with TAH/BSO, omentectomy, debulking argon beam coagulation of right diaphragm, appendectomy and rectosigmoid resection and anastamosis.   No residual disease left at the end of surgery.  Normal post op course.   Germline MyRisk testing negative for mutations.  Somatic testing with HRD pending.   Medical co-morbidities complicating care: not significant Plan:   Problem List Items Addressed This Visit      Endocrine   Ovarian cancer, bilateral ([place]) - Primary     We discussed that she is doing well post interval debulking surgery and set to receive three additional cycles of chemotherapy starting in about 10 days.   We also discussed the need for results from pending HRD testing to see if she would likely derive major benefit from maintenance PARP inhibitor.    Discussed management of bowel movements given recent surgery and alternating constipation and diarrhea.  Recommended she cut miralax to 1/2 dose once to twice a day with goal of one to two soft, but not liquid, bowel movements per day. Would want to avoid pain, straining, and constipation particularly  post-operatively. Hold stool softeners for now. Encouraged increasing fluids, fiber, and exercise as tolerated to help regulate bowels overall. She will follow up with me by phone first of next week or sooner if symptoms do not improve/normalize in the interim.   The patient's diagnosis, an outline of the further diagnostic and laboratory studies which will be required, the recommendation for surgery, and alternatives were discussed with her and her accompanying family members.  All questions were answered to their satisfaction.  A total of 30 minutes were spent with the patient/family today; 50 % was spent in education, counseling and coordination of care for  Probable ovarian cancer.    Mellody Drown, MD  CC:  Plotnikov, [name], MD [address] [place],  [place] [address] [phone]

## 2019-09-28 ENCOUNTER — Encounter: Payer: Self-pay | Admitting: Nurse Practitioner

## 2019-10-02 ENCOUNTER — Encounter: Payer: Self-pay | Admitting: Nurse Practitioner

## 2019-10-02 ENCOUNTER — Ambulatory Visit
Admission: RE | Admit: 2019-10-02 | Discharge: 2019-10-02 | Disposition: A | Discharge status: Home / Self Care | Payer: Self-pay | Source: Ambulatory Visit | Attending: Nurse Practitioner | Admitting: Nurse Practitioner

## 2019-10-02 ENCOUNTER — Telehealth: Payer: Self-pay

## 2019-10-02 ENCOUNTER — Inpatient Hospital Stay (HOSPITAL_BASED_OUTPATIENT_CLINIC_OR_DEPARTMENT_OTHER): Payer: Self-pay | Admitting: Nurse Practitioner

## 2019-10-02 ENCOUNTER — Telehealth: Payer: Self-pay | Admitting: *Deleted

## 2019-10-02 ENCOUNTER — Other Ambulatory Visit: Payer: Self-pay | Admitting: *Deleted

## 2019-10-02 ENCOUNTER — Inpatient Hospital Stay: Payer: Self-pay | Admitting: *Deleted

## 2019-10-02 ENCOUNTER — Other Ambulatory Visit: Payer: Self-pay

## 2019-10-02 VITALS — Wt 116.0 lb

## 2019-10-02 DIAGNOSIS — Z95828 Presence of other vascular implants and grafts: Secondary | ICD-10-CM

## 2019-10-02 DIAGNOSIS — C569 Malignant neoplasm of unspecified ovary: Secondary | ICD-10-CM

## 2019-10-02 DIAGNOSIS — R1012 Left upper quadrant pain: Secondary | ICD-10-CM

## 2019-10-02 DIAGNOSIS — K5900 Constipation, unspecified: Secondary | ICD-10-CM

## 2019-10-02 LAB — COMPREHENSIVE METABOLIC PANEL
ALT: 28 U/L (ref 0–44)
AST: 23 U/L (ref 15–41)
Albumin: 4.4 g/dL (ref 3.5–5.0)
Alkaline Phosphatase: 116 U/L (ref 38–126)
Anion gap: 8 (ref 5–15)
BUN: 17 mg/dL (ref 8–23)
CO2: 26 mmol/L (ref 22–32)
Calcium: 9.2 mg/dL (ref 8.9–10.3)
Chloride: 103 mmol/L (ref 98–111)
Creatinine, Ser: 0.66 mg/dL (ref 0.44–1.00)
GFR calc Af Amer: >60 mL/min (ref >60)
GFR calc non Af Amer: >60 mL/min (ref >60)
Glucose, Bld: 109 mg/dL — ABNORMAL HIGH (ref 70–99)
Potassium: 3.8 mmol/L (ref 3.5–5.1)
Sodium: 137 mmol/L (ref 135–145)
Total Bilirubin: 0.5 mg/dL (ref 0.3–1.2)
Total Protein: 7.4 g/dL (ref 6.5–8.1)

## 2019-10-02 LAB — CBC WITH DIFFERENTIAL/PLATELET
Abs Immature Granulocytes: 0.02 10*3/uL (ref 0.00–0.07)
Basophils Absolute: 0 10*3/uL (ref 0.0–0.1)
Basophils Relative: 0 %
Eosinophils Absolute: 0.2 10*3/uL (ref 0.0–0.5)
Eosinophils Relative: 3 %
HCT: 35.6 % — ABNORMAL LOW (ref 36.0–46.0)
Hemoglobin: 11.8 g/dL — ABNORMAL LOW (ref 12.0–15.0)
Immature Granulocytes: 0 %
Lymphocytes Relative: 32 %
Lymphs Abs: 2 10*3/uL (ref 0.7–4.0)
MCH: 31.7 pg (ref 26.0–34.0)
MCHC: 33.1 g/dL (ref 30.0–36.0)
MCV: 95.7 fL (ref 80.0–100.0)
Monocytes Absolute: 0.5 10*3/uL (ref 0.1–1.0)
Monocytes Relative: 8 %
Neutro Abs: 3.5 10*3/uL (ref 1.7–7.7)
Neutrophils Relative %: 57 %
Platelets: 230 10*3/uL (ref 150–400)
RBC: 3.72 MIL/uL — ABNORMAL LOW (ref 3.87–5.11)
RDW: 15.1 % (ref 11.5–15.5)
WBC: 6.2 10*3/uL (ref 4.0–10.5)
nRBC: 0 % (ref 0.0–0.2)

## 2019-10-02 MED ORDER — HEPARIN SOD (PORK) LOCK FLUSH 100 UNIT/ML IV SOLN
500.0000 [IU] | Freq: Once | INTRAVENOUS | Status: DC
  Filled 2019-10-02: qty 5

## 2019-10-02 MED ORDER — HEPARIN SOD (PORK) LOCK FLUSH 100 UNIT/ML IV SOLN
500.0000 [IU] | INTRAVENOUS | Status: AC | PRN
  Administered 2019-10-02: 500 [IU]
  Filled 2019-10-02: qty 5

## 2019-10-02 MED ORDER — IOHEXOL 300 MG/ML  SOLN
75.0000 mL | Freq: Once | INTRAMUSCULAR | Status: AC | PRN
  Administered 2019-10-02: 75 mL via INTRAVENOUS

## 2019-10-02 MED ORDER — SODIUM CHLORIDE 0.9% FLUSH
10.0000 mL | Freq: Once | INTRAVENOUS | Status: AC
  Administered 2019-10-02: 10 mL via INTRAVENOUS
  Filled 2019-10-02: qty 10

## 2019-10-02 NOTE — Telephone Encounter (Signed)
Received voicemail from Ms. Vanwieren that se is not feeling better and may be regressing and would like to be seen this Wednesday. Noted triage was also notified and she is pending call back. Received voicemail. Message left .

## 2019-10-02 NOTE — Telephone Encounter (Signed)
Patient called requesting to speak with Lauren. She states she was seen last week and that she is no better and would like to discuss this. Please return her call [phone]

## 2019-10-02 NOTE — Progress Notes (Signed)
[facility]  [phone] [phone]  Patient Care Team: Plotnikov, [name], MD as PCP - General (Internal Medicine) Arvella Nigh, MD as Consulting Physician (Obstetrics and Gynecology) Clent Jacks, RN as Oncology Nurse Navigator   Name of the patient: Amanda Pena  CX:[id number]  05-26-1956   Date of visit: 10/02/19  Diagnosis-high-grade serous ovarian cancer  Chief complaint/ Reason for visit-abdominal pain  Heme/Onc history:  Oncology History Overview Note  #November 2020-omental caking/peritoneal carcinomatosis; bilateral adnexal masses 4-5 cm in size; multiple lung nodules;   # dec 8th 2020-omental biopsy; positive for high-grade serous adenocarcinoma; GYN origin.  CT scan chest-bilateral lung nodules; 3.8 cm soft tissue mass adjacent to GE junction  # DEC 11TH 2020-CARBO-Taxol [chemo consent]  # #Genetic counseling-referral.   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[place])   06/30/2019 -  Chemotherapy   The patient had palonosetron (ALOXI) injection 0.25 mg, 0.25 mg, Intravenous,  Once, 3 of 6 cycles Administration: 0.25 mg (06/30/2019), 0.25 mg (07/24/2019), 0.25 mg (08/14/2019) CARBOplatin (PARAPLATIN) 570 mg in sodium chloride 0.9 % 250 mL chemo infusion, 570 mg (100 % of original dose 567 mg), Intravenous,  Once, 3 of 6 cycles Dose modification:   (original dose 567 mg, Cycle 1) Administration: 570 mg (06/30/2019), 570 mg (07/24/2019), 570 mg (08/14/2019) fosaprepitant (EMEND) 150 mg in sodium chloride 0.9 % 145 mL IVPB, 150 mg, Intravenous,  Once, 1 of 1 cycle PACLitaxel (TAXOL) 288 mg in sodium  chloride 0.9 % 250 mL chemo infusion (> 80mg /m2), 175 mg/m2 = 288 mg, Intravenous,  Once, 3 of 6 cycles Administration: 288 mg (06/30/2019), 288 mg (07/24/2019), 288 mg (08/14/2019) fosaprepitant (EMEND) 150 mg, dexamethasone (DECADRON) 12 mg in sodium chloride 0.9 % 145 mL IVPB, , Intravenous,  Once, 2 of 5 cycles Administration:  (07/24/2019),  (08/14/2019)  for chemotherapy treatment.      Interval history-Amiria Heaton, 64 year old female diagnosed with high-grade serous ovarian cancer status post 3 cycles of neoadjuvant chemotherapy followed by interval debulking on 2/23 for residual disease that required laparotomy with TAH-BSO, omentectomy, debulking argon beam coagulation of right diaphragm, appendectomy, and rectosigmoid resection and anastomosis.  No residual disease at the end of surgery.  She presents to symptom management clinic today for complaints of constipation and abdominal pain.  She has been struggling with constipation since her surgery, describes passing 1 very small hard stool pellet intermittently throughout the day with associated fecal urgency.  She is have increased pain which she localizes to epigastric/left upper quadrant.  Less active due to pain.  Rates pain between a 3 and 7.  Worse with movement.  Has been splinting her incision though says it is painful to touch. Has tried taking Aleve without significant improvement.  Did not get relief with Tylenol.  Prefers to avoid opiates.  Per patient, there was previously some concern of stricture at site of anastomosis.  Patient had rectal exam at 09/27/2019 visit but anastomosis was higher than could be palpated.  No masses or stool in the rectum. No nausea or vomiting.  Has noticed some pockets of possible fluid accumulation to the left side  of her surgical incision.  Says she was previously told it was a hematoma.  No bruising of the skin and seems to be improving.  Has also suffered hot flashes since her surgery which occur at night  intermittently.  ECOG FS:2 - Symptomatic, <50% confined to bed  Review of systems- Review of Systems  Constitutional: Positive for malaise/fatigue. Negative for chills, fever and weight loss.  HENT: Negative for hearing loss, nosebleeds, sore throat and tinnitus.   Eyes: Negative for blurred vision and double vision.  Respiratory: Negative for cough, hemoptysis, shortness of breath and wheezing.   Cardiovascular: Negative for chest pain, palpitations and leg swelling.  Gastrointestinal: Positive for abdominal pain and constipation. Negative for blood in stool, diarrhea, melena, nausea and vomiting.  Genitourinary: Negative for dysuria and urgency.  Musculoskeletal: Negative for back pain, falls, joint pain and myalgias.  Skin: Negative for itching and rash.  Neurological: Negative for dizziness, tingling, sensory change, loss of consciousness, weakness and headaches.  Endo/Heme/Allergies: Negative for environmental allergies. Does not bruise/bleed easily.  Psychiatric/Behavioral: Negative for depression. The patient is nervous/anxious. The patient does not have insomnia.    Current treatment-carbo-Taxol chemotherapy  No Known Allergies  Past Medical History:  Diagnosis Date  . Cancer ([place])   . HYPERLIPIDEMIA 04/09/2008   Qualifier: Diagnosis of  By: Wynona Luna   . HYPOTHYROIDISM 04/09/2008   Qualifier: Diagnosis of  By: Wynona Luna   . INSOMNIA, CHRONIC 08/21/2010   Qualifier: Diagnosis of  By: Wynona Luna   . 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 IR IMAGING GUIDED PORT INSERTION  07/18/2019  . THYROIDECTOMY, PARTIAL      Social History   Socioeconomic History  . Marital status: Married    Spouse name: Not on file  . Number of children: Not on file  . Years of education: Not on file  . Highest education level: Not on file    Occupational History  . Not on file  Tobacco Use  . Smoking status: Never Smoker  . Smokeless tobacco: Never Used  Substance and Sexual Activity  . Alcohol use: Yes  . Drug use: No  . Sexual activity: Yes  Other Topics Concern  . 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 Difficulty of Paying Living Expenses:   Food Insecurity:   . Worried About Charity fundraiser in the Last Year:   . Arboriculturist in the Last Year:   Transportation Needs:   . Film/video editor (Medical):   Marland Kitchen Lack of Transportation (Non-Medical):   Physical Activity:   . Days of Exercise per Week:   . Minutes of Exercise per Session:   Stress:   . Feeling of Stress :   Social Connections:   . Frequency of Communication with Friends and Family:   . Frequency of Social Gatherings with Friends and Family:   . Attends Religious Services:   . Active Member of Clubs or Organizations:   . Attends Archivist Meetings:   Marland Kitchen Marital Status:   Intimate Partner Violence:   . Fear of Current or Ex-Partner:   .  Emotionally Abused:   Marland Kitchen Physically Abused:   . Sexually Abused:     Family History  Problem Relation Age of Onset  . Arthritis Mother   . Hyperlipidemia Mother   . Hyperlipidemia Father   . Diabetes Father   . Hypertension Father   . Heart disease Father 67       CAD  . Lung cancer Father   . Breast cancer Maternal Grandmother        in 4s    Current Outpatient Medications:  .  ALPRAZolam (XANAX) 0.5 MG tablet, TAKE 1 TABLET BY MOUTH THREE TIMES A DAY AS NEEDED FOR ANXIETY, Disp: 90 tablet, Rfl: 0 .  Artificial Tear Solution (20/20 ARTIFICIAL TEARS OP), Apply to eye., Disp: , Rfl:  .  Ascorbic Acid (VITAMIN C) 1000 MG tablet, Take 1,000 mg by mouth daily., Disp: , Rfl:  .   lidocaine-prilocaine (EMLA) cream, Apply generously to the port area 45-60 mins prior., Disp: 30 g, Rfl: 0 .  MELATONIN PO, Take by mouth., Disp: , Rfl:  .  polyethylene glycol (MIRALAX / GLYCOLAX) 17 g packet, Take 17 g by mouth daily., Disp: , Rfl:  .  Probiotic Product (PROBIOTIC-10) CHEW, Chew 1 capsule by mouth., Disp: , Rfl:  .  sertraline (ZOLOFT) 50 MG tablet, TAKE 1 TABLET BY MOUTH EVERY DAY, Disp: 90 tablet, Rfl: 2 .  SYNTHROID 75 MCG tablet, Take 1 tablet (75 mcg total) by mouth daily before breakfast., Disp: 90 tablet, Rfl: 3 .  HYDROcodone-acetaminophen (NORCO/VICODIN) 5-325 MG tablet, Take 1 tablet by mouth every 6 (six) hours as needed for severe pain. (Patient not taking: Reported on 08/30/2019), Disp: 45 tablet, Rfl: 0 .  Omega-3 Fatty Acids (FISH OIL) 1000 MG CAPS, Take 2 capsules by mouth daily., Disp: , Rfl:  .  ondansetron (ZOFRAN) 8 MG tablet, One pill every 8 hours as needed for nausea/vomitting. (Patient not taking: Reported on 10/02/2019), Disp: 40 tablet, Rfl: 1 .  prochlorperazine (COMPAZINE) 10 MG tablet, Take 1 tablet (10 mg total) by mouth every 6 (six) hours as needed for nausea or vomiting. (Patient not taking: Reported on 10/02/2019), Disp: 40 tablet, Rfl: 1 .  promethazine (PHENERGAN) 25 MG tablet, Take 1 tablet (25 mg total) by mouth every 6 (six) hours as needed for nausea or vomiting. (Patient not taking: Reported on 10/02/2019), Disp: 30 tablet, Rfl: 0 .  traMADol (ULTRAM) 50 MG tablet, Take 1 tablet (50 mg total) by mouth every 6 (six) hours as needed for moderate pain or severe pain. (Patient not taking: Reported on 08/30/2019), Disp: 30 tablet, Rfl: 0 .  vitamin E 1000 UNIT capsule, Take 4,000 Units by mouth daily., Disp: , Rfl:  .  zolpidem (AMBIEN) 10 MG tablet, TAKE 1 TABLET BY MOUTH EVERY DAY AT BEDTIME AS NEEDED FOR SLEEP (Patient not taking: Reported on 08/30/2019), Disp: 30 tablet, Rfl: 3  Physical exam:  Vitals:   10/02/19 1336  TempSrc: Tympanic  Weight:  116 lb (52.6 kg)   Physical Exam Constitutional:      Comments: Thin built.  Accompanied by daughter.  Wearing mask.  Pulmonary:     Effort: Pulmonary effort is normal. No respiratory distress.  Abdominal:     General: There is no distension.     Tenderness: There is abdominal tenderness in the epigastric area and left upper quadrant.     Comments: Surgical incision midline without evidence of infection.  No bruising.  Skin:    General: Skin is warm and  dry.  Neurological:     Mental Status: She is alert and oriented to person, place, and time.  Psychiatric:        Mood and Affect: Mood is anxious.      CMP Latest Ref Rng & Units 08/14/2019  Glucose 70 - 99 mg/dL 162(H)  BUN 8 - 23 mg/dL 17  Creatinine 0.44 - 1.00 mg/dL 0.70  Sodium 135 - 145 mmol/L 138  Potassium 3.5 - 5.1 mmol/L 3.7  Chloride 98 - 111 mmol/L 103  CO2 22 - 32 mmol/L 26  Calcium 8.9 - 10.3 mg/dL 9.3  Total Protein 6.5 - 8.1 g/dL 7.1  Total Bilirubin 0.3 - 1.2 mg/dL 0.6  Alkaline Phos 38 - 126 U/L 103  AST 15 - 41 U/L 30  ALT 0 - 44 U/L 35   CBC Latest Ref Rng & Units 08/14/2019  WBC 4.0 - 10.5 K/uL 3.2(L)  Hemoglobin 12.0 - 15.0 g/dL 11.6(L)  Hematocrit 36.0 - 46.0 % 36.6  Platelets 150 - 400 K/uL 176    No images are attached to the encounter.  DG Abd 2 Views  Result Date: 10/02/2019 CLINICAL DATA:  Alternating constipation and diarrhea. EXAM: ABDOMEN - 2 VIEW COMPARISON:  Abdominal CT 08/29/2019 FINDINGS: Prominent stool volume today, although not involving all segments. No rectal impaction. No concerning mass effect or small bowel distension. Lung bases are clear. IMPRESSION: Diffuse stool retention. Electronically Signed   By: Monte Fantasia M.D.   On: 10/02/2019 11:50    Assessment and plan- Patient is a 64 y.o. female diagnosed with high-grade serous ovarian cancer status post neoadjuvant carbo-Taxol chemotherapy followed by interval debulking with TAH-BSO, omentectomy, debulking argon beam  coagulation of right diaphragm, appendectomy, and rectosigmoid resection and anastomosis to no residual disease, who presents to symptom management clinic for constipation and abdominal pain.  1.  Constipation and abdominal pain-etiology unclear.  X-ray showed diffuse stool burden consistent with constipation.  Question is contributing to abdominal pain though pain more significant given her post op course and timeframe. Will symptoms, will get CT to evaluate further. Discussed with Dr. Fransisca Connors who agrees with plan. He also advises that if negative could consider rectal dye study.  CT showed moderate stool volume in the left colon and rectum.  No evidence of bowel obstruction, stricture, or inflammation.  Suspect abdominal pain secondary to constipation. Discussed with patient who since CT scan has had improvement in BMs. Hold off on additional stool softeners at this time. May consider up-titration of bowel regimen with goal of bowel movement every to every other day without pain or straining.   2. Seroma- ct showed seroma measuring 14 mm without surrounding inflammatory changes or peripheral enhancement.  Does not correlate with patient's symptoms. Continue to monitor. Could consult IR for possible drainage if symptoms present.   3. Abdominal pain- ok to use tylenol and alternate with aleve/ibuprofen for pain. Minimize opiates given constipation.   Disposition:  Follow up with Dr. Rogue Bussing as scheduled. If symptoms worsen in the interim, contact clinic to be evaluated sooner   Visit Diagnosis 1. Left upper quadrant abdominal pain   2. Constipation, unspecified constipation type    Patient expressed understanding and was in agreement with this plan. She also understands that She can call clinic at any time with any questions, concerns, or complaints.   Thank you for allowing me to participate in the care of this very pleasant patient.   Beckey Rutter, DNP, AGNP-[facility]  [phone]  CC: Dr. Theora Gianotti, Dr. Fransisca Connors, Dr. Rogue Bussing

## 2019-10-02 NOTE — Telephone Encounter (Signed)
I'd like to see her in clinic with an abdominal xray prior to visit. I'll put orders in. Doni- could you please contact patient to let her know what time to come in please? Thanks!

## 2019-10-02 NOTE — Telephone Encounter (Signed)
Spoke to patient via telephone. She agreed to go have an abdominal xray done and will be here to see Beckey Rutter, NP at 1:30 today.

## 2019-10-03 ENCOUNTER — Encounter: Payer: Self-pay | Admitting: Nurse Practitioner

## 2019-10-03 ENCOUNTER — Encounter: Payer: Self-pay | Admitting: Internal Medicine

## 2019-10-03 ENCOUNTER — Telehealth: Payer: Self-pay | Admitting: *Deleted

## 2019-10-03 ENCOUNTER — Telehealth: Payer: Self-pay

## 2019-10-03 NOTE — Telephone Encounter (Signed)
[facility] myChoice CDx has resulted. Copy provided to Dr. Rogue Bussing and copy sent to HIM for scanning to medical record. Will provide copy to Ms. Docken at her appointment 3/19 for her records.  [facility] HRD Status: NEGATIVE GIS Status: NEGATIVE:  Patient Genomic Instability Score: 14 A Genomic Instability Score (GIS) of 42 or greater confers a positive GIS status. Tumor Mutation BRCA1/BRCA2 Status: NEGATIVE FOR A CLINICALLY SIGNIFICANT MUTATION

## 2019-10-03 NOTE — Telephone Encounter (Signed)
FYI your patient.

## 2019-10-03 NOTE — Telephone Encounter (Signed)
Spoke to patient. Discussed results of CT scan. Seroma does not correspond to her site of pain and I think pain likely secondary to stool burden. She has had a couple of larger, more normal bowel movements since her CT scan and feels pain has somewhat improved. Now passing some more liquid stool. Fecal urgency has resolved. Will hold off on laxatives and stool softeners at this time as patient fearful of diarrhea given her history. She will call clinic this afternoon to give an update.

## 2019-10-03 NOTE — Telephone Encounter (Signed)
Patient called asking to speak with Beckey Rutter, NP about her scan results.  IMPRESSION: 1. Interval hysterectomy and bilateral oophorectomy, omentectomy, and rectosigmoid resection for serous ovarian cancer. Small 14 mm fluid density structure in the left pelvis abutting the sigmoid colonic suture line has no surrounding inflammatory change or peripheral enhancement, and favors postoperative seroma. 2. Resection of previous omental nodularity. No evidence of new or recurrent disease. 3. Previous prominent appendix is not seen, question interval appendectomy. 4. Moderate volume of stool in the left colon and rectum, can be seen with constipation. No bowel obstruction or bowel inflammation. 5. Hepatic steatosis.   Electronically Signed   By: Keith Rake M.D.   On: 10/02/2019 17:14

## 2019-10-04 ENCOUNTER — Inpatient Hospital Stay (HOSPITAL_BASED_OUTPATIENT_CLINIC_OR_DEPARTMENT_OTHER): Payer: Self-pay | Admitting: Obstetrics and Gynecology

## 2019-10-04 VITALS — BP 107/72 | HR 93 | Ht 66.0 in | Wt 115.2 lb

## 2019-10-04 DIAGNOSIS — C562 Malignant neoplasm of left ovary: Secondary | ICD-10-CM

## 2019-10-04 DIAGNOSIS — C561 Malignant neoplasm of right ovary: Secondary | ICD-10-CM

## 2019-10-04 DIAGNOSIS — G8918 Other acute postprocedural pain: Secondary | ICD-10-CM

## 2019-10-04 DIAGNOSIS — K5909 Other constipation: Secondary | ICD-10-CM

## 2019-10-04 DIAGNOSIS — C569 Malignant neoplasm of unspecified ovary: Secondary | ICD-10-CM

## 2019-10-04 MED ORDER — LIDOCAINE 5 % EX PTCH: 1.0000 | MEDICATED_PATCH | CUTANEOUS | 0 refills | Status: DC

## 2019-10-04 NOTE — Patient Instructions (Signed)

## 2019-10-04 NOTE — Progress Notes (Signed)
Patient here today as acute add on for abdominal pain. Patient reports 7/10 abdominal pain now, states she has only tried extra strength Tylenol which offers no relief. Patient reports some nausea, denies vomiting. Patient states she has had at least 11 "explosive" bowel movements since yesterday.

## 2019-10-04 NOTE — Progress Notes (Signed)
Gynecologic Oncology Consult Visit   Referring Provider: Dr. Alain Marion  Chief Complaint: High grade serous ovarian cancer, advanced stage  Subjective:  Amanda Pena is a 64 y.o. P1 post menopausal female, initially seen in consultation from Dr. Alain Marion for adnexal masses with carcinomatosis, who returns to clinic for evaluation of abdominal pain.   She is very concerned about abdominal pain associated with her incision on the left lateral aspect of the incision. She has alternating diarrhea and constipation but that may be compounded by how she is taking the Miralax. She does not have any fevers or chills. She is taking Tylenol for pain and it is not working adequately for pain control. She is worried about taking opioids and that it will cause worsening constipation. She is scheduled to restart chemotherapy this Friday.   CT A/P 10/02/2019 IMPRESSION: 1. Interval hysterectomy and bilateral oophorectomy, omentectomy, and rectosigmoid resection for serous ovarian cancer. Small 14 mm fluid density structure in the left pelvis abutting the sigmoid colonic suture line has no surrounding inflammatory change or peripheral enhancement, and favors postoperative seroma. 2. Resection of previous omental nodularity. No evidence of new or recurrent disease. 3. Previous prominent appendix is not seen, question interval appendectomy. 4. Moderate volume of stool in the left colon and rectum, can be seen with constipation. No bowel obstruction or bowel inflammation. 5. Hepatic steatosis.   Gynecologic Oncology History:  In November 2020 she complained of severe lower quadrant pain with abdominal distention/bloating and saw her PCP who ordered CT scan 11/26, which showed heterogeneous soft tissue masses bilateral adnexa, measuring 4.3 x 3.6 cm on left, 4.6 x 2.7 cm on right. Diffuse omental soft tissue caking and diffuse peritoneal thickening are seen throughout the abdomen and pelvis with mild ascites.  Findings consistent with peritoneal carcinomatosis.  Additionally, multiple pulmonary nodules in right lower lobe largest measuring 12 mm.   Menopause age 73.  No HRT. Last pap- 07/26/2017- NILM.  She complained of night sweats, intermittent constipation, and chronic back pain at initial visit. She takes multiple supplements.   06/16/19 - CT showed: heterogeneous soft tissue masses bilateral adnexa, measuring 4.3 x 3.6 cm on left, 4.6 x 2.7 cm on right. Diffuse omental soft tissue caking and diffuse peritoneal thickening are seen throughout the abdomen and pelvis with mild ascites. Findings consistent with peritoneal carcinomatosis. Additionally, multiple pulmonary nodules in right lower lobe largest measuring 12 mm.   [id number] on 06/21/19  06/27/2019 CT Chest to complete staging showed multiple bilateral pulmonary nodules suspicious for metastatic disease larges measuring 1.0 cm in RLL. Soft tissue mass adjacent to GE junction suspicious for metastasis, right pleural effusion, right basilar atelectasis, ascites, CAD.   Omental mass was biopsied on 06/27/2019 which showed:  HIGH-GRADE SEROUS CARCINOMA OF GYNECOLOGIC ORIGIN.   In view of concern for lung metastases, she initiated carbo-taxol chemotherapy on 06/30/2019  Instead of undergoing primary debulking.  She received 3 cycles. After first cycle she experienced bone pain and bone scan was performed which was negative for osseous metastatic disease. Port was placed for chemotherapy for patient preference on 07/18/2019.   08/29/2019- CT Chest/Abdomen/Pelvis for restaging 1. Interval response to therapy, decrease in peritoneal carcinomatosis with decreased tumor volume within the omentum. Previously noted bilateral enlarged ovaries have decreased in size from previous exam. Thickened appendix identified along th eright pericolic gutter has decreased in caliber in the interval.  2. Interval improvement in previously noted right lower lobe pulmonary  nodularity which is favored to represent sequelae of  inflammatory or infectious process. A few persistent, stable milli metric lung nodules are nonspecific but likely represent a benign process. Attention on follow-up imaging advised.   CA -125 was elevated at time of diagnosis and has been followed:  06/21/2019 628 07/24/19  145 08/14/19 32.2  CEA was 1.2 on 06/21/2019.   CA125 normalized after three cycles of carboplatin/taxol. She had interval debulking surgery at [facility] 2/23 with TAH,BSO, omentectomy, appendectomy, rectosigmoid resection and reanastamosis, argon beam coagulation of diaphragm with no residual disease left behind.  She had an uneventful postoperative course.  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  09/12/2019 She had interval debulking surgery at [facility] with TAH,BSO, omentectomy, appendectomy, rectosigmoid resection and reanastamosis, argon beam coagulation of diaphragm with no residual disease left behind.  She had an uneventful postoperative course.  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 She has had alternating constipation and diarrhea with new abdominal pain since surgery. Abdominal xray showed constipation.    Germline genetic testing negative.  HRD negative for somatic BRCA1/2 mutations. GIS negative.  Problem List: Patient Active Problem List   Diagnosis Date Noted  . Postoperative pain 10/04/2019  . Other constipation 10/04/2019  . Peritoneal carcinoma ([place]) 06/23/2019  . Malignant neoplasm of ovary ([place]) 06/23/2019  . Abdominal pain 06/13/2019  . LLQ abdominal pain 06/13/2019  . Calcific bursitis of shoulder 01/29/2016  . Cough 10/15/2014  . Cervical pain (neck) 10/15/2014  . Situational anxiety 03/07/2014  . Goals of care, counseling/discussion 02/10/2013  . 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 Preventative health care 11/09/2011  . INSOMNIA, CHRONIC 08/21/2010  . PERSONAL HISTORY MALIGNANT NEOPLASM THYROID 08/21/2010  .  Hypothyroidism 04/09/2008  . Hyperlipidemia 04/09/2008  . Sinusitis 04/09/2008    Past Medical History: Past Medical History:  Diagnosis Date  . Cancer ([place])   . HYPERLIPIDEMIA 04/09/2008   Qualifier: Diagnosis of  By: Wynona Luna   . HYPOTHYROIDISM 04/09/2008   Qualifier: Diagnosis of  By: Wynona Luna   . INSOMNIA, CHRONIC 08/21/2010   Qualifier: Diagnosis of  By: Wynona Luna   . PERSONAL HISTORY MALIGNANT NEOPLASM THYROID 08/21/2010   Qualifier: Diagnosis of  By: Wynona Luna     Past Surgical History: Past Surgical History:  Procedure Laterality Date  . IR IMAGING GUIDED PORT INSERTION  07/18/2019  . THYROIDECTOMY, PARTIAL      Family History: Family History  Problem Relation Age of Onset  . Arthritis Mother   . Hyperlipidemia Mother   . Hyperlipidemia Father   . Diabetes Father   . Hypertension Father   . Heart disease Father 2       CAD  . Lung cancer Father   . Breast cancer Maternal Grandmother        in 8s    Social History: Social History   Socioeconomic History  . Marital status: Married    Spouse name: Not on file  . Number of children: Not on file  . Years of education: Not on file  . Highest education level: Not on file  Occupational History  . Not on file  Tobacco Use  . Smoking status: Never Smoker  . Smokeless tobacco: Never Used  Substance and Sexual Activity  . Alcohol use: Yes  . Drug use: No  . Sexual activity: Yes  Other Topics Concern  . 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 Difficulty of Paying Living Expenses:   Food Insecurity:   . Worried About Charity fundraiser in the Last Year:   . Arboriculturist in the Last Year:   Transportation Needs:   . Film/video editor (Medical):   Marland Kitchen Lack of  Transportation (Non-Medical):   Physical Activity:   . Days of Exercise per Week:   . Minutes of Exercise per Session:   Stress:   . Feeling of Stress :   Social Connections:   . Frequency of Communication with Friends and Family:   . Frequency of Social Gatherings with Friends and Family:   . Attends Religious Services:   . Active Member of Clubs or Organizations:   . Attends Archivist Meetings:   Marland Kitchen Marital Status:   Intimate Partner Violence:   . Fear of Current or Ex-Partner:   . Emotionally Abused:   Marland Kitchen Physically Abused:   . Sexually Abused:     Allergies: No Known Allergies  Current Medications: Current Outpatient Medications  Medication Sig Dispense Refill  . ALPRAZolam (XANAX) 0.5 MG tablet TAKE 1 TABLET BY MOUTH THREE TIMES A DAY AS NEEDED FOR ANXIETY 90 tablet 0  . Artificial Tear Solution (20/20 ARTIFICIAL TEARS OP) Apply to eye.    . Ascorbic Acid (VITAMIN C) 1000 MG tablet Take 1,000 mg by mouth daily.    Marland Kitchen lidocaine-prilocaine (EMLA) cream Apply generously to the port area 45-60 mins prior. 30 g 0  . MELATONIN PO Take by mouth.    . Omega-3 Fatty Acids (FISH OIL) 1000 MG CAPS Take 2 capsules by mouth daily.    . ondansetron (ZOFRAN) 8 MG tablet One pill every 8 hours as needed for nausea/vomitting. 40 tablet 1  . polyethylene glycol (MIRALAX / GLYCOLAX) 17 g packet Take 17 g by mouth daily.    . Probiotic Product (PROBIOTIC-10) CHEW Chew 1 capsule by mouth.    . sertraline (ZOLOFT) 50 MG tablet TAKE 1 TABLET BY MOUTH EVERY DAY 90 tablet 2  . SYNTHROID 75 MCG tablet Take 1 tablet (75 mcg total) by mouth daily before breakfast. 90 tablet 3  . vitamin E 1000 UNIT capsule Take 4,000 Units by mouth daily.    Marland Kitchen HYDROcodone-acetaminophen (NORCO/VICODIN) 5-325 MG tablet Take 1 tablet by mouth every 6 (six) hours as needed for severe pain. (Patient not taking: Reported on 08/30/2019) 45 tablet 0  . lidocaine (LIDODERM) 5 % Place 1 patch onto the skin daily. Remove  & Discard patch within 12 hours or as directed by MD 30 patch 0  . prochlorperazine (COMPAZINE) 10 MG tablet Take 1 tablet (10 mg total) by mouth every 6 (six) hours as needed for nausea or vomiting. (Patient not taking: Reported on 10/02/2019) 40 tablet 1  . promethazine (PHENERGAN) 25 MG tablet Take 1 tablet (25 mg total) by mouth every 6 (six) hours as needed for nausea or vomiting. (Patient not taking: Reported on 10/02/2019) 30 tablet 0  . traMADol (ULTRAM) 50 MG tablet Take 1 tablet (50 mg total) by mouth every 6 (six) hours as needed for moderate pain or severe pain. (Patient not taking: Reported on 08/30/2019) 30 tablet 0  . zolpidem (AMBIEN) 10 MG tablet TAKE 1 TABLET BY MOUTH EVERY DAY AT BEDTIME AS NEEDED FOR SLEEP (Patient not taking: Reported on 08/30/2019) 30 tablet 3  No current facility-administered medications for this visit.   Facility-Administered Medications Ordered in Other Visits  Medication Dose Route Frequency Provider Last Rate Last Admin  . heparin lock flush 100 unit/mL  500 Units Intravenous Once Verlon Au, NP       Review of Systems She declined to complete ROS  Objective:  Physical Examination:  BP 107/72   Pulse 93   Ht _0  (1.676 m)   Wt 115 lb 3.2 oz (52.3 kg)   BMI 18.59 kg/m     ECOG Performance Status: 1 - Symptomatic but completely ambulatory  GENERAL: Patient is a well appearing female in no acute distress HEENT:  Sclera clear. Anicteric ABDOMEN:  Soft, nondistended. She is tender to palpation at a 4-5 cm area just left of the ventral incision. There is no obvious hernia or mass. Other abdominal areas are nontender.  Incision is well healed. No ascites SKIN:  Clear with no obvious rashes or skin changes.  NEURO:  Nonfocal. Well oriented.  Appropriate affect.  Pelvic: Patient declined. Deferred from 09/27/2019 EGBUS: no lesions Vagina: no lesions, cuff healing well. Bimanual: no palpable masses Rectovaginal: no masses or stool.  Cannot feel  the anastomosis because it is higher in the pelvis.    Radiologic Imaging:  As noted per interval history CBC    Component Value Date/Time   WBC 6.2 10/02/2019 1443   RBC 3.72 (L) 10/02/2019 1443   HGB 11.8 (L) 10/02/2019 1443   HCT 35.6 (L) 10/02/2019 1443   PLT 230 10/02/2019 1443   MCV 95.7 10/02/2019 1443   MCH 31.7 10/02/2019 1443   MCHC 33.1 10/02/2019 1443   RDW 15.1 10/02/2019 1443   LYMPHSABS 2.0 10/02/2019 1443   MONOABS 0.5 10/02/2019 1443   EOSABS 0.2 10/02/2019 1443   BASOSABS 0.0 10/02/2019 1443   CMP Latest Ref Rng & Units 10/02/2019 08/14/2019 08/03/2019  Glucose 70 - 99 mg/dL 109(H) 162(H) 131(H)  BUN 8 - 23 mg/dL _1 Creatinine 0.44 - 1.00 mg/dL 0.66 0.70 0.60  Sodium 135 - 145 mmol/L 137 138 138  Potassium 3.5 - 5.1 mmol/L 3.8 3.7 3.9  Chloride 98 - 111 mmol/L 103 103 102  CO2 22 - 32 mmol/L _2 Calcium 8.9 - 10.3 mg/dL 9.2 9.3 9.2  Total Protein 6.5 - 8.1 g/dL 7.4 7.1 -  Total Bilirubin 0.3 - 1.2 mg/dL 0.5 0.6 -  Alkaline Phos 38 - 126 U/L 116 103 -  AST 15 - 41 U/L 23 30 -  ALT 0 - 44 U/L 28 35 -      Assessment:  Amanda Pena is a 64 y.o. female diagnosed with high grade serous cancer of ovarian/tubal origin.  Thought initially to have lung metastases and treated with three cycles of neoadjuvant chemotherapy. Subsequent  CT suggested the lung findings may not have been lung mets.  CA125 regressed to normal and CT scan 1/21 showed minimal residual disease.  Underwent interval debulking 2/23 and had residual disease that required laparotomy with TAH/BSO, omentectomy, debulking argon beam coagulation of right diaphragm, appendectomy and rectosigmoid resection and anastamosis.   No residual disease left at the end of surgery.    Postoperative pain most likely soft tissue etiology.   Germline MyRisk testing negative for mutations.  Somatic testing with [employer] somatic mutations for  BRCA1/2 negative. HRD - GIS negative.  Medical  co-morbidities complicating care: not significant Plan:   Problem List Items Addressed This Visit  Endocrine   Malignant neoplasm of ovary ([place])     Other   Other constipation   Postoperative pain - Primary     We reviewed her films with radiology and no evidence of hernia. Where she complains of pain there is evidence of fat necrosis. Recommended using Tylenol for pain control but also consider adding NSAIDs (to take with food given risk of gastritis and ulcer disease), opioids, and Lidoderm patch. Her pain should improve over time. We also provide an abdominal binder. If her symptoms do not improve or worsen she will contact us.   We reviewed somatic and HRD testing results and we provided her results. We discussed moving forward with chemotherapy on Friday and that it is a balance between postoperative recovery and disease control. We discussed that PARPi maintenance may be an option, however, magnitude of benefit was not as high in those with biomarker negative disease. Another option is adding bevacizumab to her chemotherapy regimen at cycle #5 and continuing bevacizumab maintenance after chemotherapy. She will discuss these options further with Dr. Rogue Bussing.   More information regarding controlling constipation was provided. We discussed the importance of finding the best dose of Miralax to take daily to avoid constipation, need to added stool softeners, dietary modifications. Discussed how her bowel will work differently after her surgery and that her symptoms should start to improve with time. She does not have any evidence on exam of anastomotic leak. On imaging there does not appear to be stenosis. If she has continued difficulty with constipation she may need a gastrografin enema to assess for stenosis.   The patient's diagnosis, an outline of the further diagnostic and laboratory studies which will be required, the recommendation for surgery, and alternatives were discussed with  her and her accompanying family members.  All questions were answered to their satisfaction.  Beckey Rutter, NP helped scribe the note. I independently saw the patient, obtained her history, performed the exam, and developed the assessment and plan.   [place] Gaetana Michaelis, MD

## 2019-10-06 ENCOUNTER — Inpatient Hospital Stay (HOSPITAL_BASED_OUTPATIENT_CLINIC_OR_DEPARTMENT_OTHER): Payer: Self-pay | Admitting: Internal Medicine

## 2019-10-06 ENCOUNTER — Inpatient Hospital Stay: Payer: Self-pay

## 2019-10-06 ENCOUNTER — Encounter: Payer: Self-pay | Admitting: Internal Medicine

## 2019-10-06 VITALS — BP 115/79 | HR 120 | Temp 97.1°F | Wt 115.0 lb

## 2019-10-06 VITALS — HR 92

## 2019-10-06 DIAGNOSIS — C482 Malignant neoplasm of peritoneum, unspecified: Secondary | ICD-10-CM

## 2019-10-06 DIAGNOSIS — F411 Generalized anxiety disorder: Secondary | ICD-10-CM

## 2019-10-06 MED ORDER — HEPARIN SOD (PORK) LOCK FLUSH 100 UNIT/ML IV SOLN
INTRAVENOUS | Status: AC
  Filled 2019-10-06: qty 5

## 2019-10-06 MED ORDER — DIPHENHYDRAMINE HCL 50 MG/ML IJ SOLN
25.0000 mg | Freq: Once | INTRAMUSCULAR | Status: AC
  Administered 2019-10-06: 25 mg via INTRAVENOUS
  Filled 2019-10-06: qty 1

## 2019-10-06 MED ORDER — SODIUM CHLORIDE 0.9 % IV SOLN
520.0000 mg | Freq: Once | INTRAVENOUS | Status: AC
  Administered 2019-10-06: 520 mg via INTRAVENOUS
  Filled 2019-10-06: qty 52

## 2019-10-06 MED ORDER — SODIUM CHLORIDE 0.9 % IV SOLN
Freq: Once | INTRAVENOUS | Status: AC
  Filled 2019-10-06: qty 250

## 2019-10-06 MED ORDER — PALONOSETRON HCL INJECTION 0.25 MG/5ML
0.2500 mg | Freq: Once | INTRAVENOUS | Status: AC
  Administered 2019-10-06: 0.25 mg via INTRAVENOUS
  Filled 2019-10-06: qty 5

## 2019-10-06 MED ORDER — ALPRAZOLAM 0.5 MG PO TABS: 0.5000 mg | ORAL_TABLET | Freq: Two times a day (BID) | ORAL | 0 refills | Status: DC | PRN

## 2019-10-06 MED ORDER — HEPARIN SOD (PORK) LOCK FLUSH 100 UNIT/ML IV SOLN
500.0000 [IU] | Freq: Once | INTRAVENOUS | Status: AC | PRN
  Administered 2019-10-06: 500 [IU]
  Filled 2019-10-06: qty 5

## 2019-10-06 MED ORDER — SODIUM CHLORIDE 0.9 % IV SOLN
Freq: Once | INTRAVENOUS | Status: AC
  Filled 2019-10-06: qty 5

## 2019-10-06 MED ORDER — SODIUM CHLORIDE 0.9 % IV SOLN
175.0000 mg/m2 | Freq: Once | INTRAVENOUS | Status: AC
  Administered 2019-10-06: 288 mg via INTRAVENOUS
  Filled 2019-10-06: qty 48

## 2019-10-06 MED ORDER — SODIUM CHLORIDE 0.9 % IV SOLN: 567.0000 mg | Freq: Once | INTRAVENOUS | Status: DC

## 2019-10-06 MED ORDER — FAMOTIDINE IN NACL 20-0.9 MG/50ML-% IV SOLN
20.0000 mg | Freq: Once | INTRAVENOUS | Status: AC
  Administered 2019-10-06: 20 mg via INTRAVENOUS
  Filled 2019-10-06: qty 50

## 2019-10-06 NOTE — Assessment & Plan Note (Addendum)
#  Stage IV-bilateral ovarian/fallopian tube/peritoneal high-grade serous carcinoma-status post neoadjuvant chemotherapy followed by debulking surgery.   #Proceed with cycle #4 of carbotaxol chemotherapy. Labs today reviewed;  acceptable for treatment today.  Will discuss regarding maintenance therapy at subsequent visits.  Discussed with Dr. Theora Gianotti.  #Abdominal pain-likely secondary recent surgery; no concern for an acute abdomen.  Recommend tramadol for pain as needed  #Diarrhea alternating constipation-related to recent surgery/bowel resection.  Recommend MiraLAX as needed.  # DISPOSITION: # Carbo-Taxol today; # Follow up 1 week in [facility] - labs- cbc/bmp; IVFs over 1 hour.  # in 3 weeks- MD; labs- cbc/cmp; ca-125- Carbo-Taxol;  Dr.B

## 2019-10-06 NOTE — Progress Notes (Signed)
[place] CONSULT NOTE  Patient Care Team: Plotnikov, [name], MD as PCP - General (Internal Medicine) Arvella Nigh, MD as Consulting Physician (Obstetrics and Gynecology) Clent Jacks, RN as Oncology Nurse Navigator  CHIEF COMPLAINTS/PURPOSE OF CONSULTATION: Peritoneal carcinomatosis  #  Oncology History Overview Note  #November 2020-omental caking/peritoneal carcinomatosis; bilateral adnexal masses 4-5 cm in size; multiple lung nodules;   # dec 8th 2020-omental biopsy; positive for high-grade serous adenocarcinoma; GYN origin.  CT scan chest-bilateral lung nodules; 3.8 cm soft tissue mass adjacent to GE junction  # DEC 11TH 2020-CARBO-Taxol [chemo consent] x 3 cycles; February 2021-improvement of the peritoneal carcinomatosis; lung lesions.. 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  Partial colectomy  # #Genetic counseling-s/p  # Thyroid cancer at 26y s/p thyroidectomy [no RAIU]  # NGS/MOLECULAR TESTS: My risk myriad negative; HRD negative  # PALLIATIVE CARE EVALUATION:P  # PAIN MANAGEMENT: P   DIAGNOSIS: Bilateral ovarian cancer  STAGE: IV   ;  GOALS: Control  CURRENT/MOST RECENT THERAPY : Carbotaxol [C]    Peritoneal carcinoma ([place])  06/23/2019 Initial Diagnosis   Peritoneal carcinoma ([place])   06/30/2019 -  Chemotherapy   The patient had palonosetron (ALOXI) injection 0.25 mg, 0.25 mg, Intravenous,  Once, 4 of 6 cycles Administration: 0.25 mg (06/30/2019), 0.25 mg (07/24/2019), 0.25 mg (08/14/2019), 0.25 mg (10/06/2019) CARBOplatin (PARAPLATIN) 570 mg in sodium chloride 0.9 % 250 mL chemo infusion, 570 mg (100 % of original dose 567 mg), Intravenous,  Once, 4 of 6 cycles Dose modification:   (original dose 567 mg, Cycle 1) Administration: 570 mg (06/30/2019), 570 mg (07/24/2019), 570 mg (08/14/2019) fosaprepitant (EMEND) 150 mg in sodium chloride 0.9 % 145  mL IVPB, 150 mg, Intravenous,  Once, 1 of 1 cycle PACLitaxel (TAXOL) 288 mg in sodium chloride 0.9 % 250 mL chemo infusion (> '80mg'$ /m2), 175 mg/m2 = 288 mg, Intravenous,  Once, 4 of 6 cycles Administration: 288 mg (06/30/2019), 288 mg (07/24/2019), 288 mg (08/14/2019), 288 mg (10/06/2019) fosaprepitant (EMEND) 150 mg, dexamethasone (DECADRON) 12 mg in sodium chloride 0.9 % 145 mL IVPB, , Intravenous,  Once, 3 of 5 cycles Administration:  (07/24/2019),  (08/14/2019),  (10/06/2019)  for chemotherapy treatment.     HISTORY OF PRESENTING ILLNESS:  Amanda Pena 64 y.o.  female high-grade serous adenocarcinoma peritoneal carcinomatosis/bilateral adnexal masses status post neoadjuvant chemotherapy carbotaxol 3 cycles.  Patient in interim underwent debulking surgery TAH plus BSO; also partial colectomy.   Patient is recovering fairly well from surgery.  However continues to have intermittent abdominal pain alternate constipation with diarrhea.  She is nervous regarding starting chemotherapy.  Given abdominal complaints patient also underwent recent CT scan abdomen pelvis that showed no recurrent disease.  However did show stool in the colon.  She is currently accompanied by her daughter to proceed with chemotherapy.   Review of Systems  Constitutional: Positive for malaise/fatigue. Negative for chills, diaphoresis, fever and weight loss.  HENT: Negative for nosebleeds and sore throat.   Eyes: Negative for double vision.  Respiratory: Negative for cough, hemoptysis, sputum production, shortness of breath and wheezing.   Cardiovascular: Negative for chest pain, palpitations, orthopnea and leg swelling.  Gastrointestinal: Positive for abdominal pain, constipation and diarrhea. Negative for blood in stool, heartburn, melena, nausea and vomiting.  Genitourinary: Negative for dysuria, frequency and urgency.  Musculoskeletal: Positive for back pain. Negative for joint pain.  Skin: Negative for itching.   Neurological: Negative for dizziness, tingling,  focal weakness, weakness and headaches.  Endo/Heme/Allergies: Does not bruise/bleed easily.  Psychiatric/Behavioral: Negative for depression. The patient is not nervous/anxious and does not have insomnia.      MEDICAL HISTORY:  Past Medical History:  Diagnosis Date  . Cancer ([place])   . HYPERLIPIDEMIA 04/09/2008   Qualifier: Diagnosis of  By: Wynona Luna   . HYPOTHYROIDISM 04/09/2008   Qualifier: Diagnosis of  By: Wynona Luna   . INSOMNIA, CHRONIC 08/21/2010   Qualifier: Diagnosis of  By: Wynona Luna   . PERSONAL HISTORY MALIGNANT NEOPLASM THYROID 08/21/2010   Qualifier: Diagnosis of  By: Wynona Luna     SURGICAL HISTORY: Past Surgical History:  Procedure Laterality Date  . IR IMAGING GUIDED PORT INSERTION  07/18/2019  . THYROIDECTOMY, PARTIAL      SOCIAL HISTORY: Social History   Socioeconomic History  . Marital status: Married    Spouse name: Not on file  . Number of children: Not on file  . Years of education: Not on file  . Highest education level: Not on file  Occupational History  . Not on file  Tobacco Use  . Smoking status: Never Smoker  . Smokeless tobacco: Never Used  Substance and Sexual Activity  . Alcohol use: Yes  . Drug use: No  . Sexual activity: Yes  Other Topics Concern  . 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 Difficulty of Paying Living Expenses:   Food Insecurity:   . Worried About Charity fundraiser in the Last Year:   . Arboriculturist in the Last Year:   Transportation Needs:   . Film/video editor (Medical):   Marland Kitchen Lack of Transportation (Non-Medical):   Physical Activity:   . Days of Exercise per Week:   . Minutes of Exercise per Session:   Stress:   .  Feeling of Stress :   Social Connections:   . Frequency of Communication with Friends and Family:   . Frequency of Social Gatherings with Friends and Family:   . Attends Religious Services:   . Active Member of Clubs or Organizations:   . Attends Archivist Meetings:   Marland Kitchen Marital Status:   Intimate Partner Violence:   . Fear of Current or Ex-Partner:   . Emotionally Abused:   Marland Kitchen Physically Abused:   . Sexually Abused:     FAMILY HISTORY: Family History  Problem Relation Age of Onset  . Arthritis Mother   . Hyperlipidemia Mother   . Hyperlipidemia Father   . Diabetes Father   . Hypertension Father   . Heart disease Father 76       CAD  . Lung cancer Father   . Breast cancer Maternal Grandmother        in 43s    ALLERGIES:  has No Known Allergies.  MEDICATIONS:  Current Outpatient Medications  Medication Sig Dispense Refill  . ALPRAZolam (XANAX) 0.5 MG tablet Take 1 tablet (0.5 mg total) by mouth 2 (two) times daily as needed for anxiety. 60 tablet 0  . Artificial Tear Solution (20/20 ARTIFICIAL TEARS OP) Apply to eye.    . Ascorbic Acid (VITAMIN C) 1000 MG tablet Take 1,000 mg by mouth daily.    Marland Kitchen HYDROcodone-acetaminophen (NORCO/VICODIN) 5-325  MG tablet Take 1 tablet by mouth every 6 (six) hours as needed for severe pain. 45 tablet 0  . lidocaine (LIDODERM) 5 % Place 1 patch onto the skin daily. Remove & Discard patch within 12 hours or as directed by MD 30 patch 0  . lidocaine-prilocaine (EMLA) cream Apply generously to the port area 45-60 mins prior. 30 g 0  . MELATONIN PO Take by mouth.    . Omega-3 Fatty Acids (FISH OIL) 1000 MG CAPS Take 2 capsules by mouth daily.    . ondansetron (ZOFRAN) 8 MG tablet One pill every 8 hours as needed for nausea/vomitting. 40 tablet 1  . polyethylene glycol (MIRALAX / GLYCOLAX) 17 g packet Take 17 g by mouth daily.    . Probiotic Product (PROBIOTIC-10) CHEW Chew 1 capsule by mouth.    . prochlorperazine (COMPAZINE) 10 MG  tablet Take 1 tablet (10 mg total) by mouth every 6 (six) hours as needed for nausea or vomiting. 40 tablet 1  . promethazine (PHENERGAN) 25 MG tablet Take 1 tablet (25 mg total) by mouth every 6 (six) hours as needed for nausea or vomiting. 30 tablet 0  . sertraline (ZOLOFT) 50 MG tablet TAKE 1 TABLET BY MOUTH EVERY DAY 90 tablet 2  . SYNTHROID 75 MCG tablet Take 1 tablet (75 mcg total) by mouth daily before breakfast. 90 tablet 3  . traMADol (ULTRAM) 50 MG tablet Take 1 tablet (50 mg total) by mouth every 6 (six) hours as needed for moderate pain or severe pain. 30 tablet 0  . vitamin E 1000 UNIT capsule Take 4,000 Units by mouth daily.    Marland Kitchen zolpidem (AMBIEN) 10 MG tablet TAKE 1 TABLET BY MOUTH EVERY DAY AT BEDTIME AS NEEDED FOR SLEEP 30 tablet 3   No current facility-administered medications for this visit.   Facility-Administered Medications Ordered in Other Visits  Medication Dose Route Frequency Provider Last Rate Last Admin  . heparin lock flush 100 unit/mL  500 Units Intravenous Once Verlon Au, NP        PHYSICAL EXAMINATION: ECOG PERFORMANCE STATUS: 1 - Symptomatic but completely ambulatory  Vitals:   10/06/19 0900  BP: 115/79  Pulse: (!) 120  Temp: (!) 97.1 F (36.2 C)   Filed Weights   10/06/19 0900  Weight: 115 lb (52.2 kg)    Physical Exam  Constitutional: She is oriented to person, place, and time and well-developed, well-nourished, and in no distress.  Accompanied by daughter.  Walking independently.  HENT:  Head: Normocephalic and atraumatic.  Mouth/Throat: Oropharynx is clear and moist. No oropharyngeal exudate.  Eyes: Pupils are equal, round, and reactive to light.  Cardiovascular: Normal rate and regular rhythm.  Pulmonary/Chest: Effort normal and breath sounds normal. No respiratory distress. She has no wheezes.  Abdominal: Soft. Bowel sounds are normal. She exhibits no mass. There is abdominal tenderness. There is no rebound and no guarding.   Musculoskeletal:        General: No tenderness or edema. Normal range of motion.     Cervical back: Normal range of motion and neck supple.  Neurological: She is alert and oriented to person, place, and time.  Skin: Skin is warm.  Psychiatric: Affect normal.     LABORATORY DATA:  I have reviewed the data as listed Lab Results  Component Value Date   WBC 6.2 10/02/2019   HGB 11.8 (L) 10/02/2019   HCT 35.6 (L) 10/02/2019   MCV 95.7 10/02/2019   PLT 230 10/02/2019  Recent Labs    06/13/19 1517 06/21/19 1024 07/24/19 0829 07/24/19 0829 08/03/19 1025 08/14/19 0821 10/02/19 1443  NA 139   < > 138   < > 138 138 137  K 3.9   < > 3.8   < > 3.9 3.7 3.8  CL 102   < > 103   < > 102 103 103  CO2 30   < > 25   < > '28 26 26  '$ GLUCOSE 117*   < > 112*   < > 131* 162* 109*  BUN 20   < > 18   < > '15 17 17  '$ CREATININE 0.89   < > 0.64   < > 0.60 0.70 0.66  CALCIUM 9.1   < > 9.3   < > 9.2 9.3 9.2  GFRNONAA  --    < > >60   < > >60 >60 >60  GFRAA  --    < > >60   < > >60 >60 >60  PROT 6.9   < > 7.5  --   --  7.1 7.4  ALBUMIN 3.7   < > 4.2  --   --  3.9 4.4  AST 31   < > 27  --   --  30 23  ALT 22   < > 29  --   --  35 28  ALKPHOS 120*   < > 106  --   --  103 116  BILITOT 0.4   < > 0.7  --   --  0.6 0.5  BILIDIR 0.0  --   --   --   --   --   --    < > = values in this interval not displayed.    RADIOGRAPHIC STUDIES: I have personally reviewed the radiological images as listed and agreed with the findings in the report. CT Abdomen Pelvis W Contrast  Result Date: 10/02/2019 CLINICAL DATA:  Abdominal abscess/infection suspected worsening abdominal pain, s/p TAH-BSO omentectomy and rectosigmoid resection AND anastamosis for high grade serous ovarian cancer. Surgery reportedly 09/12/2019 [facility]. EXAM: CT ABDOMEN AND PELVIS WITH CONTRAST TECHNIQUE: Multidetector CT imaging of the abdomen and pelvis was performed using the standard protocol following bolus administration of intravenous  contrast. CONTRAST:  68m OMNIPAQUE IOHEXOL 300 MG/ML  SOLN COMPARISON:  Preoperative CT 08/29/2019 FINDINGS: Lower chest: No pleural effusion. Previous right lower lobe nodular densities on chest CT are not seen, may not be included in the field of view or resolved. Hepatobiliary: Hepatic steatosis. No evidence of focal liver lesion. Gallbladder decompressed. No calcified gallstone. No biliary dilatation. Pancreas: No ductal dilatation or inflammation. Spleen: Normal in size without focal abnormality. Adrenals/Urinary Tract: Normal adrenal glands. Prominence of both renal collecting systems without frank hydronephrosis. There is homogeneous enhancement with symmetric excretion on delayed phase imaging. No perinephric edema. Small hypodensity in the right kidney is unchanged and consistent with simple cyst. Urinary bladder is unremarkable. No bladder wall thickening. Stomach/Bowel: Stomach is unremarkable. Normal positioning of the ligament of Treitz. There is no bowel obstruction, administered enteric contrast reaches the distal transverse colon. No small bowel inflammation. The appendix is not confidently visualized on the current exam, possible appendectomy. Contrast opacifies the cecum, ascending, proximal transverse colon. The distal transverse colon is redundant. There is moderate volume of stool in the left colon. Enteric chain sutures noted in the rectosigmoid. 14 mm fluid density structure adjacent to the sigmoid colonic suture line is nonspecific, there is no peripheral  enhancement or surrounding inflammation. There is stool in the rectum. Vascular/Lymphatic: Mild aortic atherosclerosis. No aortic aneurysm. The portal vein is patent. Mesenteric vessels appear patent. No enlarged abdominopelvic lymph nodes. Reproductive: Interval hysterectomy and bilateral oophorectomy. No new adnexal mass. Other: 14 mm fluid density structure in the left pelvis abutting the sigmoid enteric suture line has no peripheral  enhancement or inflammatory change in favors small postoperative seroma. There is no other focal fluid collection or abscess. No free air. The previous omental nodularity is not definitively seen. No definite new peritoneal deposits. Postsurgical change of the anterior abdominal wall without subcutaneous fluid collection. Musculoskeletal: No acute finding or focal bone lesion. IMPRESSION: 1. Interval hysterectomy and bilateral oophorectomy, omentectomy, and rectosigmoid resection for serous ovarian cancer. Small 14 mm fluid density structure in the left pelvis abutting the sigmoid colonic suture line has no surrounding inflammatory change or peripheral enhancement, and favors postoperative seroma. 2. Resection of previous omental nodularity. No evidence of new or recurrent disease. 3. Previous prominent appendix is not seen, question interval appendectomy. 4. Moderate volume of stool in the left colon and rectum, can be seen with constipation. No bowel obstruction or bowel inflammation. 5. Hepatic steatosis. Electronically Signed   By: Keith Rake M.D.   On: 10/02/2019 17:14   DG Abd 2 Views  Result Date: 10/02/2019 CLINICAL DATA:  Alternating constipation and diarrhea. EXAM: ABDOMEN - 2 VIEW COMPARISON:  Abdominal CT 08/29/2019 FINDINGS: Prominent stool volume today, although not involving all segments. No rectal impaction. No concerning mass effect or small bowel distension. Lung bases are clear. IMPRESSION: Diffuse stool retention. Electronically Signed   By: Monte Fantasia M.D.   On: 10/02/2019 11:50    ASSESSMENT & PLAN:   Peritoneal carcinoma ([place]) #Stage IV-bilateral ovarian/fallopian tube/peritoneal high-grade serous carcinoma-status post neoadjuvant chemotherapy followed by debulking surgery.   #Proceed with cycle #4 of carbotaxol chemotherapy. Labs today reviewed;  acceptable for treatment today.  Will discuss regarding maintenance therapy at subsequent visits.  Discussed with Dr.  Theora Gianotti.  #Abdominal pain-likely secondary recent surgery; no concern for an acute abdomen.  Recommend tramadol for pain as needed  #Diarrhea alternating constipation-related to recent surgery/bowel resection.  Recommend MiraLAX as needed.  # DISPOSITION: # Carbo-Taxol today; # Follow up 1 week in [facility] - labs- cbc/bmp; IVFs over 1 hour.  # in 3 weeks- MD; labs- cbc/cmp; ca-125- Carbo-Taxol;  Dr.B   All questions were answered. The patient knows to call the clinic with any problems, questions or concerns.    Cammie Sickle, MD 10/08/2019 9:57 PM

## 2019-10-06 NOTE — Progress Notes (Signed)
Per md ok to decrease dose to 520mg 

## 2019-10-11 ENCOUNTER — Telehealth: Payer: Self-pay | Admitting: Nurse Practitioner

## 2019-10-11 ENCOUNTER — Telehealth: Payer: Self-pay | Admitting: *Deleted

## 2019-10-11 NOTE — Telephone Encounter (Signed)
-----   Message from Wallene Dales sent at 10/11/2019  9:05 AM EDT ----- Regarding: Cancel 3/26 Appt Pt called and would like to cancel her appts on 3/26. She stated she's feeling good and does not need to be seen. I have not canceled appt. Wanted to make the teams aware first.  Thank you Jerene Pitch

## 2019-10-11 NOTE — Telephone Encounter (Signed)
Spoke to patient. She is feeling better. Some nausea. No vomiting. Tolerating orals moderately well. Bowel movements more stable. Abdominal pain nearly resolved. Ok to hold off on fluids for now. If she feels that symptoms don't improve, she will call back to reschedule or be seen in [facility].

## 2019-10-11 NOTE — Telephone Encounter (Signed)
Spoke with Dr. Jacinto Reap. md aware that patient desires to cnl. apts with [facility]

## 2019-10-13 ENCOUNTER — Inpatient Hospital Stay: Payer: Self-pay

## 2019-10-13 ENCOUNTER — Inpatient Hospital Stay: Payer: Self-pay | Admitting: Nurse Practitioner

## 2019-10-19 ENCOUNTER — Encounter: Payer: Self-pay | Admitting: Internal Medicine

## 2019-10-19 NOTE — Progress Notes (Signed)
On 4/1 LVM to discuss her treatment options.  GB

## 2019-10-19 NOTE — Progress Notes (Signed)
Pharmacist Chemotherapy Monitoring - Follow Up Assessment    I verify that I have reviewed each item in the below checklist:  . Regimen for the patient is scheduled for the appropriate day and plan matches scheduled date. Marland Kitchen Appropriate non-routine labs are ordered dependent on drug ordered. . If applicable, additional medications reviewed and ordered per protocol based on lifetime cumulative doses and/or treatment regimen.   Plan for follow-up and/or issues identified: No . I-vent associated with next due treatment: No . MD and/or nursing notified: No  Kierstynn Babich K 10/19/2019 8:54 AM

## 2019-10-23 ENCOUNTER — Telehealth: Payer: Self-pay | Admitting: Internal Medicine

## 2019-10-23 NOTE — Telephone Encounter (Signed)
On 4/05-spoke to patient regarding options for maintenance therapy after induction chemotherapy-include maintenance Avastin versus maintenance Zejula. Discussed the pros and cons of each option. Patient states that she is leaning towards surveillance at this time; and starting therapy if tumor marker going up.   We'll discuss further at next visit

## 2019-10-25 ENCOUNTER — Ambulatory Visit: Payer: Self-pay

## 2019-10-26 ENCOUNTER — Other Ambulatory Visit: Payer: Self-pay

## 2019-10-26 ENCOUNTER — Encounter: Payer: Self-pay | Admitting: Nurse Practitioner

## 2019-10-27 ENCOUNTER — Inpatient Hospital Stay: Payer: Self-pay | Attending: Internal Medicine

## 2019-10-27 ENCOUNTER — Ambulatory Visit: Payer: Self-pay

## 2019-10-27 ENCOUNTER — Inpatient Hospital Stay (HOSPITAL_BASED_OUTPATIENT_CLINIC_OR_DEPARTMENT_OTHER): Payer: Self-pay | Admitting: Internal Medicine

## 2019-10-27 ENCOUNTER — Inpatient Hospital Stay: Payer: Self-pay

## 2019-10-27 VITALS — BP 108/75 | HR 85

## 2019-10-27 DIAGNOSIS — Z90722 Acquired absence of ovaries, bilateral: Secondary | ICD-10-CM | POA: Insufficient documentation

## 2019-10-27 DIAGNOSIS — C482 Malignant neoplasm of peritoneum, unspecified: Secondary | ICD-10-CM

## 2019-10-27 DIAGNOSIS — C562 Malignant neoplasm of left ovary: Secondary | ICD-10-CM | POA: Insufficient documentation

## 2019-10-27 DIAGNOSIS — Z9079 Acquired absence of other genital organ(s): Secondary | ICD-10-CM | POA: Insufficient documentation

## 2019-10-27 DIAGNOSIS — Z85850 Personal history of malignant neoplasm of thyroid: Secondary | ICD-10-CM | POA: Insufficient documentation

## 2019-10-27 DIAGNOSIS — Z5111 Encounter for antineoplastic chemotherapy: Secondary | ICD-10-CM | POA: Insufficient documentation

## 2019-10-27 DIAGNOSIS — E039 Hypothyroidism, unspecified: Secondary | ICD-10-CM | POA: Insufficient documentation

## 2019-10-27 DIAGNOSIS — C561 Malignant neoplasm of right ovary: Secondary | ICD-10-CM | POA: Insufficient documentation

## 2019-10-27 DIAGNOSIS — Z90710 Acquired absence of both cervix and uterus: Secondary | ICD-10-CM | POA: Insufficient documentation

## 2019-10-27 DIAGNOSIS — C7800 Secondary malignant neoplasm of unspecified lung: Secondary | ICD-10-CM | POA: Insufficient documentation

## 2019-10-27 DIAGNOSIS — Z95828 Presence of other vascular implants and grafts: Secondary | ICD-10-CM

## 2019-10-27 DIAGNOSIS — C786 Secondary malignant neoplasm of retroperitoneum and peritoneum: Secondary | ICD-10-CM | POA: Insufficient documentation

## 2019-10-27 DIAGNOSIS — Z79899 Other long term (current) drug therapy: Secondary | ICD-10-CM | POA: Insufficient documentation

## 2019-10-27 LAB — COMPREHENSIVE METABOLIC PANEL
ALT: 46 U/L — ABNORMAL HIGH (ref 0–44)
AST: 38 U/L (ref 15–41)
Albumin: 4.1 g/dL (ref 3.5–5.0)
Alkaline Phosphatase: 112 U/L (ref 38–126)
Anion gap: 10 (ref 5–15)
BUN: 17 mg/dL (ref 8–23)
CO2: 26 mmol/L (ref 22–32)
Calcium: 9.4 mg/dL (ref 8.9–10.3)
Chloride: 102 mmol/L (ref 98–111)
Creatinine, Ser: 0.84 mg/dL (ref 0.44–1.00)
GFR calc Af Amer: >60 mL/min (ref >60)
GFR calc non Af Amer: >60 mL/min (ref >60)
Glucose, Bld: 146 mg/dL — ABNORMAL HIGH (ref 70–99)
Potassium: 3.9 mmol/L (ref 3.5–5.1)
Sodium: 138 mmol/L (ref 135–145)
Total Bilirubin: 0.6 mg/dL (ref 0.3–1.2)
Total Protein: 7.2 g/dL (ref 6.5–8.1)

## 2019-10-27 LAB — CBC WITH DIFFERENTIAL/PLATELET
Abs Immature Granulocytes: 0.01 10*3/uL (ref 0.00–0.07)
Basophils Absolute: 0 10*3/uL (ref 0.0–0.1)
Basophils Relative: 1 %
Eosinophils Absolute: 0 10*3/uL (ref 0.0–0.5)
Eosinophils Relative: 1 %
HCT: 36.7 % (ref 36.0–46.0)
Hemoglobin: 12.2 g/dL (ref 12.0–15.0)
Immature Granulocytes: 0 %
Lymphocytes Relative: 34 %
Lymphs Abs: 1.2 10*3/uL (ref 0.7–4.0)
MCH: 31.4 pg (ref 26.0–34.0)
MCHC: 33.2 g/dL (ref 30.0–36.0)
MCV: 94.6 fL (ref 80.0–100.0)
Monocytes Absolute: 0.5 10*3/uL (ref 0.1–1.0)
Monocytes Relative: 14 %
Neutro Abs: 1.7 10*3/uL (ref 1.7–7.7)
Neutrophils Relative %: 50 %
Platelets: 118 10*3/uL — ABNORMAL LOW (ref 150–400)
RBC: 3.88 MIL/uL (ref 3.87–5.11)
RDW: 13.6 % (ref 11.5–15.5)
WBC: 3.5 10*3/uL — ABNORMAL LOW (ref 4.0–10.5)
nRBC: 0 % (ref 0.0–0.2)

## 2019-10-27 MED ORDER — PALONOSETRON HCL INJECTION 0.25 MG/5ML
0.2500 mg | Freq: Once | INTRAVENOUS | Status: AC
  Administered 2019-10-27: 0.25 mg via INTRAVENOUS
  Filled 2019-10-27: qty 5

## 2019-10-27 MED ORDER — FAMOTIDINE IN NACL 20-0.9 MG/50ML-% IV SOLN
20.0000 mg | Freq: Once | INTRAVENOUS | Status: AC
  Administered 2019-10-27: 20 mg via INTRAVENOUS
  Filled 2019-10-27: qty 50

## 2019-10-27 MED ORDER — SODIUM CHLORIDE 0.9 % IV SOLN
520.0000 mg | Freq: Once | INTRAVENOUS | Status: AC
  Administered 2019-10-27: 520 mg via INTRAVENOUS
  Filled 2019-10-27: qty 52

## 2019-10-27 MED ORDER — SODIUM CHLORIDE 0.9 % IV SOLN
Freq: Once | INTRAVENOUS | Status: AC
  Filled 2019-10-27: qty 5

## 2019-10-27 MED ORDER — SODIUM CHLORIDE 0.9% FLUSH
10.0000 mL | Freq: Once | INTRAVENOUS | Status: AC
  Administered 2019-10-27: 09:00:00 10 mL via INTRAVENOUS
  Filled 2019-10-27: qty 10

## 2019-10-27 MED ORDER — SODIUM CHLORIDE 0.9 % IV SOLN
175.0000 mg/m2 | Freq: Once | INTRAVENOUS | Status: AC
  Administered 2019-10-27: 288 mg via INTRAVENOUS
  Filled 2019-10-27: qty 48

## 2019-10-27 MED ORDER — HEPARIN SOD (PORK) LOCK FLUSH 100 UNIT/ML IV SOLN
500.0000 [IU] | Freq: Once | INTRAVENOUS | Status: AC | PRN
  Administered 2019-10-27: 500 [IU]
  Filled 2019-10-27: qty 5

## 2019-10-27 MED ORDER — SODIUM CHLORIDE 0.9 % IV SOLN
Freq: Once | INTRAVENOUS | Status: AC
  Filled 2019-10-27: qty 250

## 2019-10-27 MED ORDER — DIPHENHYDRAMINE HCL 50 MG/ML IJ SOLN
25.0000 mg | Freq: Once | INTRAMUSCULAR | Status: AC
  Administered 2019-10-27: 25 mg via INTRAVENOUS
  Filled 2019-10-27: qty 1

## 2019-10-27 MED ORDER — HEPARIN SOD (PORK) LOCK FLUSH 100 UNIT/ML IV SOLN
INTRAVENOUS | Status: AC
  Filled 2019-10-27: qty 5

## 2019-10-27 NOTE — Progress Notes (Signed)
[facility] CONSULT NOTE  Patient Care Team: Plotnikov, [name], MD as PCP - General (Internal Medicine) Arvella Nigh, MD as Consulting Physician (Obstetrics and Gynecology) Clent Jacks, RN as Oncology Nurse Navigator  CHIEF COMPLAINTS/PURPOSE OF CONSULTATION: Peritoneal carcinomatosis  #  Oncology History Overview Note  #November 2020-omental caking/peritoneal carcinomatosis; bilateral adnexal masses 4-5 cm in size; multiple lung nodules;   # dec 8th 2020-omental biopsy; positive for high-grade serous adenocarcinoma; GYN origin.  CT scan chest-bilateral lung nodules; 3.8 cm soft tissue mass adjacent to GE junction  # DEC 11TH 2020-CARBO-Taxol [chemo consent] x 3 cycles; February 2021-improvement of the peritoneal carcinomatosis; lung lesions.. 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  Partial colectomy  # #Genetic counseling-s/p NEG myRiskMyriad.   # Thyroid cancer at 71y s/p thyroidectomy [no RAIU]  # NGS/MOLECULAR TESTS: My risk myriad negative; HRD negative; NGS-P  # PALLIATIVE CARE EVALUATION:P  # PAIN MANAGEMENT: P   DIAGNOSIS: Bilateral ovarian cancer/tube/peritoneal  STAGE: IV   ;  GOALS: Control  CURRENT/MOST RECENT THERAPY : Carbotaxol [C]    Peritoneal carcinoma ([place])  06/23/2019 Initial Diagnosis   Peritoneal carcinoma ([place])   06/30/2019 -  Chemotherapy   The patient had palonosetron (ALOXI) injection 0.25 mg, 0.25 mg, Intravenous,  Once, 5 of 6 cycles Administration: 0.25 mg (06/30/2019), 0.25 mg (07/24/2019), 0.25 mg (08/14/2019), 0.25 mg (10/06/2019) CARBOplatin (PARAPLATIN) 570 mg in sodium chloride 0.9 % 250 mL chemo infusion, 570 mg (100 % of original dose 567 mg), Intravenous,  Once, 5 of 6 cycles Dose modification:   (original dose 567 mg, Cycle 1) Administration: 570 mg (06/30/2019), 570 mg (07/24/2019), 570 mg (08/14/2019) fosaprepitant  (EMEND) 150 mg in sodium chloride 0.9 % 145 mL IVPB, 150 mg, Intravenous,  Once, 1 of 1 cycle PACLitaxel (TAXOL) 288 mg in sodium chloride 0.9 % 250 mL chemo infusion (> '80mg'$ /m2), 175 mg/m2 = 288 mg, Intravenous,  Once, 5 of 6 cycles Administration: 288 mg (06/30/2019), 288 mg (07/24/2019), 288 mg (08/14/2019), 288 mg (10/06/2019) fosaprepitant (EMEND) 150 mg, dexamethasone (DECADRON) 12 mg in sodium chloride 0.9 % 145 mL IVPB, , Intravenous,  Once, 4 of 5 cycles Administration:  (07/24/2019),  (08/14/2019),  (10/06/2019)  for chemotherapy treatment.     HISTORY OF PRESENTING ILLNESS:  Amanda Pena 64 y.o.  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  Patient tolerated cycle #4 of chemotherapy 3 weeks ago fairly well.  She admits to mild nausea.  Otherwise no significant vomiting.  Complains of mild tingling and numbness in extremities.  Otherwise no falls.  No chest pain or shortness of breath no fevers or chills.  Review of Systems  Constitutional: Positive for malaise/fatigue. Negative for chills, diaphoresis, fever and weight loss.  HENT: Negative for nosebleeds and sore throat.   Eyes: Negative for double vision.  Respiratory: Negative for cough, hemoptysis, sputum production, shortness of breath and wheezing.   Cardiovascular: Negative for chest pain, palpitations, orthopnea and leg swelling.  Gastrointestinal: Positive for abdominal pain and nausea. Negative for blood in stool, heartburn, melena and vomiting.  Genitourinary: Negative for dysuria, frequency and urgency.  Musculoskeletal: Positive for back pain. Negative for joint pain.  Skin: Negative for itching.  Neurological: Positive for tingling. Negative for dizziness, focal weakness, weakness and headaches.  Endo/Heme/Allergies: Does not bruise/bleed easily.  Psychiatric/Behavioral: Negative for depression. The patient  is not nervous/anxious and does not have insomnia.  MEDICAL HISTORY:  Past Medical History:  Diagnosis Date  . Cancer ([place])   . HYPERLIPIDEMIA 04/09/2008   Qualifier: Diagnosis of  By: Wynona Luna   . HYPOTHYROIDISM 04/09/2008   Qualifier: Diagnosis of  By: Wynona Luna   . INSOMNIA, CHRONIC 08/21/2010   Qualifier: Diagnosis of  By: Wynona Luna   . PERSONAL HISTORY MALIGNANT NEOPLASM THYROID 08/21/2010   Qualifier: Diagnosis of  By: Wynona Luna     SURGICAL HISTORY: Past Surgical History:  Procedure Laterality Date  . IR IMAGING GUIDED PORT INSERTION  07/18/2019  . THYROIDECTOMY, PARTIAL      SOCIAL HISTORY: Social History   Socioeconomic History  . Marital status: Married    Spouse name: Not on file  . Number of children: Not on file  . Years of education: Not on file  . Highest education level: Not on file  Occupational History  . Not on file  Tobacco Use  . Smoking status: Never Smoker  . Smokeless tobacco: Never Used  Substance and Sexual Activity  . Alcohol use: Yes  . Drug use: No  . Sexual activity: Yes  Other Topics Concern  . 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 Difficulty of Paying Living Expenses:   Food Insecurity:   . Worried About Charity fundraiser in the Last Year:   . Arboriculturist in the Last Year:   Transportation Needs:   . Film/video editor (Medical):   Marland Kitchen Lack of Transportation (Non-Medical):   Physical Activity:   . Days of Exercise per Week:   . Minutes of Exercise per Session:   Stress:   . Feeling of Stress :   Social Connections:   . Frequency of Communication with Friends and Family:   . Frequency of Social Gatherings with Friends and Family:   . Attends Religious Services:   . Active Member  of Clubs or Organizations:   . Attends Archivist Meetings:   Marland Kitchen Marital Status:   Intimate Partner Violence:   . Fear of Current or Ex-Partner:   . Emotionally Abused:   Marland Kitchen Physically Abused:   . Sexually Abused:     FAMILY HISTORY: Family History  Problem Relation Age of Onset  . Arthritis Mother   . Hyperlipidemia Mother   . Hyperlipidemia Father   . Diabetes Father   . Hypertension Father   . Heart disease Father 43       CAD  . Lung cancer Father   . Breast cancer Maternal Grandmother        in 78s    ALLERGIES:  has No Known Allergies.  MEDICATIONS:  Current Outpatient Medications  Medication Sig Dispense Refill  . ALPRAZolam (XANAX) 0.5 MG tablet Take 1 tablet (0.5 mg total) by mouth 2 (two) times daily as needed for anxiety. 60 tablet 0  . Artificial Tear Solution (20/20 ARTIFICIAL TEARS OP) Apply to eye.    . Ascorbic Acid (VITAMIN C) 1000 MG tablet Take 1,000 mg by mouth daily.    Marland Kitchen HYDROcodone-acetaminophen (NORCO/VICODIN) 5-325 MG tablet Take 1 tablet by mouth every 6 (six) hours as needed for severe pain. 45 tablet 0  . lidocaine (LIDODERM) 5 % Place 1 patch onto the skin  daily. Remove & Discard patch within 12 hours or as directed by MD 30 patch 0  . lidocaine-prilocaine (EMLA) cream Apply generously to the port area 45-60 mins prior. 30 g 0  . MELATONIN PO Take by mouth.    . Omega-3 Fatty Acids (FISH OIL) 1000 MG CAPS Take 2 capsules by mouth daily.    . ondansetron (ZOFRAN) 8 MG tablet One pill every 8 hours as needed for nausea/vomitting. 40 tablet 1  . polyethylene glycol (MIRALAX / GLYCOLAX) 17 g packet Take 17 g by mouth daily.    . Probiotic Product (PROBIOTIC-10) CHEW Chew 1 capsule by mouth.    . prochlorperazine (COMPAZINE) 10 MG tablet Take 1 tablet (10 mg total) by mouth every 6 (six) hours as needed for nausea or vomiting. 40 tablet 1  . promethazine (PHENERGAN) 25 MG tablet Take 1 tablet (25 mg total) by mouth every 6 (six) hours as  needed for nausea or vomiting. 30 tablet 0  . sertraline (ZOLOFT) 50 MG tablet TAKE 1 TABLET BY MOUTH EVERY DAY 90 tablet 2  . SYNTHROID 75 MCG tablet Take 1 tablet (75 mcg total) by mouth daily before breakfast. 90 tablet 3  . traMADol (ULTRAM) 50 MG tablet Take 1 tablet (50 mg total) by mouth every 6 (six) hours as needed for moderate pain or severe pain. 30 tablet 0  . vitamin E 1000 UNIT capsule Take 4,000 Units by mouth daily.    Marland Kitchen zolpidem (AMBIEN) 10 MG tablet TAKE 1 TABLET BY MOUTH EVERY DAY AT BEDTIME AS NEEDED FOR SLEEP 30 tablet 3   No current facility-administered medications for this visit.   Facility-Administered Medications Ordered in Other Visits  Medication Dose Route Frequency Provider Last Rate Last Admin  . CARBOplatin (PARAPLATIN) 520 mg in sodium chloride 0.9 % 250 mL chemo infusion  520 mg Intravenous Once Charlaine Dalton R, MD      . heparin lock flush 100 unit/mL  500 Units Intracatheter Once PRN Cammie Sickle, MD      . PACLitaxel (TAXOL) 288 mg in sodium chloride 0.9 % 250 mL chemo infusion (> 16m/m2)  175 mg/m2 (Treatment Plan Recorded) Intravenous Once BCammie Sickle MD 99 mL/hr at 10/27/19 1106 288 mg at 10/27/19 1106    PHYSICAL EXAMINATION: ECOG PERFORMANCE STATUS: 1 - Symptomatic but completely ambulatory  Vitals:   10/27/19 0858 10/27/19 0945  BP: (!) 81/70 90/70  Pulse: 92   Resp: 18   Temp: (!) 97 F (36.1 C)   SpO2: 100%    Filed Weights   10/27/19 0858  Weight: 116 lb (52.6 kg)    Physical Exam  Constitutional: She is oriented to person, place, and time and well-developed, well-nourished, and in no distress.  Accompanied by daughter.  Walking independently.  HENT:  Head: Normocephalic and atraumatic.  Mouth/Throat: Oropharynx is clear and moist. No oropharyngeal exudate.  Eyes: Pupils are equal, round, and reactive to light.  Cardiovascular: Normal rate and regular rhythm.  Pulmonary/Chest: Effort normal and breath  sounds normal. No respiratory distress. She has no wheezes.  Abdominal: Soft. Bowel sounds are normal. She exhibits no mass. There is abdominal tenderness. There is no rebound and no guarding.  Musculoskeletal:        General: No tenderness or edema. Normal range of motion.     Cervical back: Normal range of motion and neck supple.  Neurological: She is alert and oriented to person, place, and time.  Skin: Skin is warm.  Psychiatric: Affect  normal.     LABORATORY DATA:  I have reviewed the data as listed Lab Results  Component Value Date   WBC 3.5 (L) 10/27/2019   HGB 12.2 10/27/2019   HCT 36.7 10/27/2019   MCV 94.6 10/27/2019   PLT 118 (L) 10/27/2019   Recent Labs    06/13/19 1517 06/21/19 1024 08/14/19 0821 10/02/19 1443 10/27/19 0825  NA 139   < > 138 137 138  K 3.9   < > 3.7 3.8 3.9  CL 102   < > 103 103 102  CO2 30   < > _0 GLUCOSE 117*   < > 162* 109* 146*  BUN 20   < > _1 CREATININE 0.89   < > 0.70 0.66 0.84  CALCIUM 9.1   < > 9.3 9.2 9.4  GFRNONAA  --    < > >60 >60 >60  GFRAA  --    < > >60 >60 >60  PROT 6.9   < > 7.1 7.4 7.2  ALBUMIN 3.7   < > 3.9 4.4 4.1  AST 31   < > 30 23 38  ALT 22   < > 35 28 46*  ALKPHOS 120*   < > 103 116 112  BILITOT 0.4   < > 0.6 0.5 0.6  BILIDIR 0.0  --   --   --   --    < > = values in this interval not displayed.    RADIOGRAPHIC STUDIES: I have personally reviewed the radiological images as listed and agreed with the findings in the report. CT Abdomen Pelvis W Contrast  Result Date: 10/02/2019 CLINICAL DATA:  Abdominal abscess/infection suspected worsening abdominal pain, s/p TAH-BSO omentectomy and rectosigmoid resection AND anastamosis for high grade serous ovarian cancer. Surgery reportedly [facility]/12/2019 at [facility]. EXAM: CT ABDOMEN AND PELVIS WITH CONTRAST TECHNIQUE: Multidetector CT imaging of the abdomen and pelvis was performed using the standard protocol following bolus administration of intravenous contrast.  CONTRAST:  51m OMNIPAQUE IOHEXOL 300 MG/ML  SOLN COMPARISON:  Preoperative CT 08/29/2019 FINDINGS: Lower chest: No pleural effusion. Previous right lower lobe nodular densities on chest CT are not seen, may not be included in the field of view or resolved. Hepatobiliary: Hepatic steatosis. No evidence of focal liver lesion. Gallbladder decompressed. No calcified gallstone. No biliary dilatation. Pancreas: No ductal dilatation or inflammation. Spleen: Normal in size without focal abnormality. Adrenals/Urinary Tract: Normal adrenal glands. Prominence of both renal collecting systems without frank hydronephrosis. There is homogeneous enhancement with symmetric excretion on delayed phase imaging. No perinephric edema. Small hypodensity in the right kidney is unchanged and consistent with simple cyst. Urinary bladder is unremarkable. No bladder wall thickening. Stomach/Bowel: Stomach is unremarkable. Normal positioning of the ligament of Treitz. There is no bowel obstruction, administered enteric contrast reaches the distal transverse colon. No small bowel inflammation. The appendix is not confidently visualized on the current exam, possible appendectomy. Contrast opacifies the cecum, ascending, proximal transverse colon. The distal transverse colon is redundant. There is moderate volume of stool in the left colon. Enteric chain sutures noted in the rectosigmoid. 14 mm fluid density structure adjacent to the sigmoid colonic suture line is nonspecific, there is no peripheral enhancement or surrounding inflammation. There is stool in the rectum. Vascular/Lymphatic: Mild aortic atherosclerosis. No aortic aneurysm. The portal vein is patent. Mesenteric vessels appear patent. No enlarged abdominopelvic lymph nodes. Reproductive: Interval hysterectomy and bilateral oophorectomy. No new adnexal mass. Other: 14 mm fluid density  structure in the left pelvis abutting the sigmoid enteric suture line has no peripheral enhancement  or inflammatory change in favors small postoperative seroma. There is no other focal fluid collection or abscess. No free air. The previous omental nodularity is not definitively seen. No definite new peritoneal deposits. Postsurgical change of the anterior abdominal wall without subcutaneous fluid collection. Musculoskeletal: No acute finding or focal bone lesion. IMPRESSION: 1. Interval hysterectomy and bilateral oophorectomy, omentectomy, and rectosigmoid resection for serous ovarian cancer. Small 14 mm fluid density structure in the left pelvis abutting the sigmoid colonic suture line has no surrounding inflammatory change or peripheral enhancement, and favors postoperative seroma. 2. Resection of previous omental nodularity. No evidence of new or recurrent disease. 3. Previous prominent appendix is not seen, question interval appendectomy. 4. Moderate volume of stool in the left colon and rectum, can be seen with constipation. No bowel obstruction or bowel inflammation. 5. Hepatic steatosis. Electronically Signed   By: Keith Rake M.D.   On: 10/02/2019 17:14   DG Abd 2 Views  Result Date: 10/02/2019 CLINICAL DATA:  Alternating constipation and diarrhea. EXAM: ABDOMEN - 2 VIEW COMPARISON:  Abdominal CT 08/29/2019 FINDINGS: Prominent stool volume today, although not involving all segments. No rectal impaction. No concerning mass effect or small bowel distension. Lung bases are clear. IMPRESSION: Diffuse stool retention. Electronically Signed   By: Monte Fantasia M.D.   On: 10/02/2019 11:50    ASSESSMENT & PLAN:   Peritoneal carcinoma ([place]) #Stage IV-bilateral ovarian/fallopian tube/peritoneal high-grade serous carcinoma-status post neoadjuvant chemotherapy followed by debulking surgery.   #Proceed with cycle #5 of carbotaxol chemotherapy. Labs today reviewed;  acceptable for treatment today.   #Discussed the maintenance options including maintenance Avastin versus Zejula [HRD/BRCA negative].   Discussed that both options would give median PFS benefit 4 to 5 months.  Discussed side effects of Avastin include-elevated blood pressure proteinuria nosebleeds risk of perforations etc.  Discussed the potential side effects of the surgery including but not limited to nausea vomiting diarrhea low platelet counts etc.  Discussed the pros and cons of each option.  If patient chooses to opt surveillance-I think it is also reasonable option.   # PN-grade 1.  Secondary to Taxol chemotherapy.  Monitor closely.  #Relative hypotension-asymptomatic.  Repeat blood pressures 53M systolic.  Proceed with chemotherapy.   # DISPOSITION: # Carbo-Taxol today; # in 3 weeks- MD; labs- cbc/cmp; ca-125- Carbo-Taxol;  Dr.B   All questions were answered. The patient knows to call the clinic with any problems, questions or concerns.    Cammie Sickle, MD 10/27/2019 12:41 PM

## 2019-10-27 NOTE — Assessment & Plan Note (Addendum)
#  Stage IV-bilateral ovarian/fallopian tube/peritoneal high-grade serous carcinoma-status post neoadjuvant chemotherapy followed by debulking surgery.   #Proceed with cycle #5 of carbotaxol chemotherapy. Labs today reviewed;  acceptable for treatment today.   #Discussed the maintenance options including maintenance Avastin versus Zejula [HRD/BRCA negative].  Discussed that both options would give median PFS benefit 4 to 5 months.  Discussed side effects of Avastin include-elevated blood pressure proteinuria nosebleeds risk of perforations etc.  Discussed the potential side effects of the surgery including but not limited to nausea vomiting diarrhea low platelet counts etc.  Discussed the pros and cons of each option.  If patient chooses to opt surveillance-I think it is also reasonable option.   # PN-grade 1.  Secondary to Taxol chemotherapy.  Monitor closely.  #Relative hypotension-asymptomatic.  Repeat blood pressures 62I systolic.  Proceed with chemotherapy.   # DISPOSITION: # Carbo-Taxol today; # in 3 weeks- MD; labs- cbc/cmp; ca-125- Carbo-Taxol;  Dr.B

## 2019-10-28 ENCOUNTER — Encounter: Payer: Self-pay | Admitting: Obstetrics and Gynecology

## 2019-10-28 ENCOUNTER — Encounter: Payer: Self-pay | Admitting: Internal Medicine

## 2019-10-28 LAB — CA 125: Cancer Antigen (CA) 125: 77 U/mL — ABNORMAL HIGH (ref 0.0–38.1)

## 2019-10-30 ENCOUNTER — Telehealth: Payer: Self-pay | Admitting: Nurse Practitioner

## 2019-10-30 NOTE — Telephone Encounter (Signed)
Spoke to patient re: Amanda Pena. She is asymptomatic currently of progressive disease. Plan to continue to monitor ca-125 in context of clinical symptoms. If ca-125 continues to trend up, may consider imaging as well. Patient verbalizes understanding and thanked for call.

## 2019-10-31 ENCOUNTER — Telehealth: Payer: Self-pay | Admitting: Internal Medicine

## 2019-10-31 NOTE — Telephone Encounter (Signed)
On 4/12-spoke to patient regarding results of the Ca1 2 5; we will continue to monitor closely.

## 2019-11-07 ENCOUNTER — Telehealth: Payer: Self-pay | Admitting: *Deleted

## 2019-11-07 NOTE — Telephone Encounter (Signed)
Called pt. , she had requested to be moved to [place] team and her next appt is4/30 but we need to move her appts up to 30 minutes earlier due scheduling issues because of lung clinic that morning. I left message to come in 8 am for labs and see md  8:30 and then start tx at 9 am. I left my direct number to call me and let us know if that change is ok.

## 2019-11-10 NOTE — Progress Notes (Signed)
Pharmacist Chemotherapy Monitoring - Follow Up Assessment    I verify that I have reviewed each item in the below checklist:  . Regimen for the patient is scheduled for the appropriate day and plan matches scheduled date. Marland Kitchen Appropriate non-routine labs are ordered dependent on drug ordered. . If applicable, additional medications reviewed and ordered per protocol based on lifetime cumulative doses and/or treatment regimen.   Plan for follow-up and/or issues identified: No . I-vent associated with next due treatment: No . MD and/or nursing notified: No  Amanda Pena 11/10/2019 8:28 AM

## 2019-11-16 ENCOUNTER — Encounter: Payer: Self-pay | Admitting: Licensed Clinical Social Worker

## 2019-11-16 ENCOUNTER — Other Ambulatory Visit: Payer: Self-pay

## 2019-11-16 ENCOUNTER — Inpatient Hospital Stay (HOSPITAL_BASED_OUTPATIENT_CLINIC_OR_DEPARTMENT_OTHER): Payer: Self-pay | Admitting: Licensed Clinical Social Worker

## 2019-11-16 DIAGNOSIS — Z803 Family history of malignant neoplasm of breast: Secondary | ICD-10-CM | POA: Insufficient documentation

## 2019-11-16 DIAGNOSIS — Z801 Family history of malignant neoplasm of trachea, bronchus and lung: Secondary | ICD-10-CM | POA: Insufficient documentation

## 2019-11-16 DIAGNOSIS — C569 Malignant neoplasm of unspecified ovary: Secondary | ICD-10-CM

## 2019-11-16 DIAGNOSIS — C482 Malignant neoplasm of peritoneum, unspecified: Secondary | ICD-10-CM

## 2019-11-16 DIAGNOSIS — Z1379 Encounter for other screening for genetic and chromosomal anomalies: Secondary | ICD-10-CM | POA: Insufficient documentation

## 2019-11-16 NOTE — Progress Notes (Signed)
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 Family history of breast cancer   3. Family history of lung cancer   4. Genetic testing    I connected with Ms. Gist on 11/16/2019 at 10:50 AM EDT by MyChart video and verified that I am speaking with the correct person using two identifiers.    Patient location: home Provider location: [place]:   Ms. Alguire, a 64 y.o. female, was seen for a [place] cancer genetics consultation at the request of Dr. Rogue Bussing due to a personal and family history of cancer, and to review her genetic test results.  Ms. Schmierer presents to clinic today to discuss the possibility of a hereditary predisposition to cancer, genetic testing, and to further clarify her future cancer risks, as well as potential cancer risks for family members.   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 Ms. Partin had neoadjuvant chemotherapy followed by TAH-BSO and adjuvant chemotherapy. She also had genetic testing done through [facility] that was negative.   CANCER HISTORY:  Oncology History Overview Note  #November 2020-omental caking/peritoneal carcinomatosis; bilateral adnexal masses 4-5 cm in size; multiple lung nodules;   # dec 8th 2020-omental biopsy; positive for high-grade serous adenocarcinoma; GYN origin.  CT scan chest-bilateral lung nodules; 3.8 cm soft tissue mass adjacent to GE junction  # DEC 11TH 2020-CARBO-Taxol [chemo consent] x 3 cycles; February 2021-improvement of the peritoneal carcinomatosis; lung lesions.. 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  Partial colectomy  # #Genetic counseling-s/p NEG myRiskMyriad.   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[place])   06/30/2019 -  Chemotherapy   The patient had palonosetron (ALOXI) injection 0.25 mg, 0.25 mg, Intravenous,  Once, 5 of 6 cycles Administration: 0.25 mg (06/30/2019), 0.25 mg (07/24/2019), 0.25 mg (08/14/2019), 0.25 mg (10/06/2019), 0.25 mg (10/27/2019) CARBOplatin (PARAPLATIN) 570 mg in sodium chloride 0.9 % 250 mL chemo infusion, 570 mg (100 % of original dose 567 mg), Intravenous,  Once, 5 of 6 cycles Dose modification:   (original dose 567 mg, Cycle 1) Administration: 570 mg (06/30/2019), 570 mg (07/24/2019), 570 mg (08/14/2019), 520 mg (10/27/2019) fosaprepitant (EMEND) 150 mg in sodium chloride 0.9 % 145 mL IVPB, 150 mg, Intravenous,  Once, 1 of 1 cycle PACLitaxel (TAXOL) 288 mg in sodium chloride 0.9 % 250 mL chemo infusion (> '80mg'$ /m2), 175 mg/m2 = 288 mg, Intravenous,  Once, 5 of 6 cycles Administration: 288 mg (06/30/2019), 288 mg (07/24/2019), 288 mg (08/14/2019), 288 mg (10/06/2019), 288 mg (10/27/2019) fosaprepitant (EMEND) 150 mg, dexamethasone (DECADRON) 12 mg in sodium chloride 0.9 % 145 mL IVPB, , Intravenous,  Once, 4 of 5 cycles Administration:  (07/24/2019),  (08/14/2019),  (10/06/2019),  (10/27/2019)  for chemotherapy treatment.     Genetic Testing   Negative genetic testing. No pathogenic variants identified on the [facility] MyRisk+HRD. The report date is 10/02/2019.  The [facility] gene panel  offered by [employer] includes sequencing and deletion/duplication testing of the following 35 genes: APC, ATM, AXIN2, BARD1, BMPR1A, BRCA1, BRCA2, BRIP1, CHD1, CDK4, CDKN2A, CHEK2, EPCAM (large  rearrangement only), HOXB13, GALNT12, MLH1, MSH2, MSH3, MSH6, MUTYH, NBN, NTHL1, PALB2, PMS2, PTEN, RAD51C, RAD51D, RNF43, RPS20, SMAD4, STK11, and TP53. Sequencing was performed for select regions of POLE and POLD1, and large rearrangement analysis was performed for select regions of GREM1.       RISK FACTORS:  Menarche was at age 57.  First live birth at age 33  OCP use for approximately >10 years.  Ovaries intact: No Hysterectomy: Yes Menopausal status: postmenopausal..  HRT use: 0 years. Colonoscopy: No, not examined.  Mammogram within the last year: No Number of breast biopsies: 0   Past Medical History:  Diagnosis Date   Cancer ([place])    Family history of breast cancer    Family history of lung cancer    HYPERLIPIDEMIA 04/09/2008   Qualifier: Diagnosis of  By: Wynona Luna    HYPOTHYROIDISM 04/09/2008   Qualifier: Diagnosis of  By: Wynona Luna    INSOMNIA, CHRONIC 08/21/2010   Qualifier: Diagnosis of  By: Wynona Luna    PERSONAL HISTORY MALIGNANT NEOPLASM THYROID 08/21/2010   Qualifier: Diagnosis of  By: Wynona Luna     Past Surgical History:  Procedure Laterality Date   IR IMAGING GUIDED PORT INSERTION  07/18/2019   THYROIDECTOMY, PARTIAL      Social History   Socioeconomic History   Marital status: Married    Spouse name: Not on file   Number of children: Not on file   Years of education: Not on file   Highest education level: Not on file  Occupational History   Not on file  Tobacco Use   Smoking status: Never Smoker   Smokeless tobacco: Never Used  Substance and Sexual Activity   Alcohol use: Yes   Drug use: No   Sexual activity: Yes  Other Topics Concern   Not on file  Social History Narrative   Lives in [place]; with husband; daughter in [place]; never smoked; rare alcohol; 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   Social Determinants of Health   Financial Resource Strain:    Difficulty  of Paying Living Expenses:   Food Insecurity:    Worried About Charity fundraiser in the Last Year:    Arboriculturist in the Last Year:   Transportation Needs:    Film/video editor (Medical):    Lack of Transportation (Non-Medical):   Physical Activity:    Days of Exercise per Week:    Minutes of Exercise per Session:   Stress:    Feeling of Stress :   Social Connections:    Frequency of Communication with Friends and Family:    Frequency of Social Gatherings with Friends and Family:    Attends Religious Services:    Active Member of Clubs or Organizations:    Attends Music therapist:    Marital Status:      FAMILY HISTORY:  We obtained a detailed, 4-generation family history.  Significant diagnoses are listed below: Family History  Problem Relation Age of Onset   Arthritis Mother    Hyperlipidemia Mother    Hyperlipidemia Father    Diabetes Father    Hypertension Father    Heart disease Father 12       CAD   Lung cancer Father  Breast cancer Maternal Grandmother        in 67s   Cancer Maternal Aunt        unk type   Cancer Maternal Uncle        unk type   Cancer Paternal Uncle        unk type, d. 73s   Skin cancer Daughter        basal cell    Ms. Ehresman has one daughter, Lilia Pro, who is 30 and has had skin cancer (basal cell carcinoma). Patient does not have siblings.  Ms. Pree mother is living at 32. Patient had 6 maternal aunts and 6 maternal uncles. Several uncles died of cancer but she is unsure the type. She also has an aunt who living who had cancer but she is unsure the type. She had several maternal cousins who had cancers, she is unsure the type for all of them but one had throat cancer and one had breast cancer at 52. Maternal grandmother had breast cancer and died in her 20s. Grandfather died in his 46s.  Ms. Armes father died at 24 and had lung cancer. Patient had 2 paternal uncles. One uncle had  cancer and died in his 31s, unknown type. Her other uncle is living. Paternal grandfather passed in his 18s. Paternal grandmother passed in her 47s and possibly had cancer.  Ms. Flud is unaware of previous family history of genetic testing for hereditary cancer risks. Patient's ancestors are of English descent. There is no reported Ashkenazi Jewish ancestry. There is no known consanguinity.  GENETIC COUNSELING ASSESSMENT: Ms. Fasching is a 64 y.o. female with a personal history and family history of cancer that is somewhat suggestive of a hereditary cancer syndrome and predisposition to cancer. She also had genetic testing that was negative. We, therefore, discussed and recommended the following at today's visit.   DISCUSSION: We discussed that 15-25% of ovarian/peritoneal cancer is hereditary, with most cases associated with BRCA1/BRCA2 mutations.  There are other genes that can be associated with hereditary ovarian/pertioneal cancer syndromes.  We discussed that testing is beneficial for several reasons including knowing if an individual is a candidate for PARP inhibitors, knowing how to follow individuals after completing their treatment, and understand if other family members could be at risk for cancer and allow them to undergo genetic testing.   We reviewed the characteristics, features and inheritance patterns of hereditary cancer syndromes. We also discussed genetic testing, including the appropriate family members to test, the process of testing, insurance coverage and turn-around-time for results. We discussed the implications of a negative, positive and/or variant of uncertain significant result.   GENETIC TEST RESULTS: Genetic testing reported out on 10/02/2019 through the [facility] HRD cancer panel found no pathogenic mutations.  The [facility] gene panel offered by [employer] includes sequencing and deletion/duplication testing of the following 35 genes: APC, ATM,  AXIN2, BARD1, BMPR1A, BRCA1, BRCA2, BRIP1, CHD1, CDK4, CDKN2A, CHEK2, EPCAM (large rearrangement only), HOXB13, GALNT12, MLH1, MSH2, MSH3, MSH6, MUTYH, NBN, NTHL1, PALB2, PMS2, PTEN, RAD51C, RAD51D, RNF43, RPS20, SMAD4, STK11, and TP53. Sequencing was performed for select regions of POLE and POLD1, and large rearrangement analysis was performed for select regions of GREM1.   The test report has been scanned into EPIC and is located under the Molecular Pathology section of the Results Review tab.  A portion of the result report is included below for reference.        We discussed with Ms. Lauf that because  current genetic testing is not perfect, it is possible there may be a gene mutation in one of these genes that current testing cannot detect, but that chance is small.  We also discussed, that there could be another gene that has not yet been discovered, or that we have not yet tested, that is responsible for the cancer diagnoses in the family. It is also possible there is a hereditary cause for the cancer in the family that Ms. Pascal did not inherit and therefore was not identified in her testing.  Therefore, it is important to remain in touch with cancer genetics in the future so that we can continue to offer Ms. Marton the most up to date genetic testing.   ADDITIONAL GENETIC TESTING: We discussed with Ms. Cammarata that her genetic testing was fairly extensive.  If there are genes identified to increase cancer risk that can be analyzed in the future, we would be happy to discuss and coordinate this testing at that time.    CANCER SCREENING RECOMMENDATIONS: Ms. Sonnier test result is considered negative (normal).  This means that we have not identified a hereditary cause for her personal and family history of cancer at this time. Most cancers happen by chance and this negative test suggests that her cancer may fall into this category.    While reassuring, this does not definitively rule out  a hereditary predisposition to cancer. It is still possible that there could be genetic mutations that are undetectable by current technology. There could be genetic mutations in genes that have not been tested or identified to increase cancer risk.  Therefore, it is recommended she continue to follow the cancer management and screening guidelines provided by her oncology and primary healthcare provider.   An individual's cancer risk and medical management are not determined by genetic test results alone. Overall cancer risk assessment incorporates additional factors, including personal medical history, family history, and any available genetic information that may result in a personalized plan for cancer prevention and surveillance.   RECOMMENDATIONS FOR FAMILY MEMBERS:  Relatives in this family might be at some increased risk of developing cancer, over the general population risk, simply due to the family history of cancer.  We recommended female relatives in this family have a yearly mammogram beginning at age 59, or 29 years younger than the earliest onset of cancer, an annual clinical breast exam, and perform monthly breast self-exams. Female relatives in this family should also have a gynecological exam as recommended by their primary provider. All family members should have a colonoscopy by age 22, or as directed by their physicians.  FOLLOW-UP: Lastly, we discussed with Ms. Hollar that cancer genetics is a rapidly advancing field and it is possible that new genetic tests will be appropriate for her and/or her family members in the future. We encouraged her to remain in contact with cancer genetics on an annual basis so we can update her personal and family histories and let her know of advances in cancer genetics that may benefit this family.   Our contact number was provided. Ms. Lansdale questions were answered to her satisfaction, and she knows she is welcome to call us at anytime with  additional questions or concerns.   Faith Rogue, MS, [facility] Genetic Counselor [email or web address] Phone: [phone]  The patient was seen for a total of 25 minutes in face-to-face genetic counseling. Her daughter, Lilia Pro was also present.  Dr. Grayland Ormond was available for discussion regarding this case.   _______________________________________________________________________ For Office Staff:  Number of people involved in session: 2 Was an Intern/ student involved with case: No

## 2019-11-17 ENCOUNTER — Inpatient Hospital Stay (HOSPITAL_BASED_OUTPATIENT_CLINIC_OR_DEPARTMENT_OTHER): Payer: Self-pay | Admitting: Oncology

## 2019-11-17 ENCOUNTER — Encounter: Payer: Self-pay | Admitting: Oncology

## 2019-11-17 ENCOUNTER — Inpatient Hospital Stay: Payer: Self-pay

## 2019-11-17 ENCOUNTER — Ambulatory Visit: Payer: Self-pay

## 2019-11-17 ENCOUNTER — Other Ambulatory Visit: Payer: Self-pay

## 2019-11-17 ENCOUNTER — Ambulatory Visit: Payer: Self-pay | Admitting: Internal Medicine

## 2019-11-17 VITALS — BP 93/75 | HR 96 | Temp 97.3°F | Resp 16 | Wt 118.4 lb

## 2019-11-17 VITALS — BP 119/72 | HR 81 | Resp 18

## 2019-11-17 DIAGNOSIS — C482 Malignant neoplasm of peritoneum, unspecified: Secondary | ICD-10-CM

## 2019-11-17 DIAGNOSIS — Z5111 Encounter for antineoplastic chemotherapy: Secondary | ICD-10-CM

## 2019-11-17 DIAGNOSIS — C569 Malignant neoplasm of unspecified ovary: Secondary | ICD-10-CM

## 2019-11-17 DIAGNOSIS — D701 Agranulocytosis secondary to cancer chemotherapy: Secondary | ICD-10-CM

## 2019-11-17 DIAGNOSIS — G620 Drug-induced polyneuropathy: Secondary | ICD-10-CM

## 2019-11-17 DIAGNOSIS — T451X5A Adverse effect of antineoplastic and immunosuppressive drugs, initial encounter: Secondary | ICD-10-CM

## 2019-11-17 DIAGNOSIS — F411 Generalized anxiety disorder: Secondary | ICD-10-CM

## 2019-11-17 LAB — COMPREHENSIVE METABOLIC PANEL
ALT: 45 U/L — ABNORMAL HIGH (ref 0–44)
AST: 36 U/L (ref 15–41)
Albumin: 4.1 g/dL (ref 3.5–5.0)
Alkaline Phosphatase: 121 U/L (ref 38–126)
Anion gap: 9 (ref 5–15)
BUN: 20 mg/dL (ref 8–23)
CO2: 27 mmol/L (ref 22–32)
Calcium: 9.3 mg/dL (ref 8.9–10.3)
Chloride: 103 mmol/L (ref 98–111)
Creatinine, Ser: 0.64 mg/dL (ref 0.44–1.00)
GFR calc Af Amer: >60 mL/min (ref >60)
GFR calc non Af Amer: >60 mL/min (ref >60)
Glucose, Bld: 128 mg/dL — ABNORMAL HIGH (ref 70–99)
Potassium: 4.1 mmol/L (ref 3.5–5.1)
Sodium: 139 mmol/L (ref 135–145)
Total Bilirubin: 0.6 mg/dL (ref 0.3–1.2)
Total Protein: 7.1 g/dL (ref 6.5–8.1)

## 2019-11-17 LAB — CBC WITH DIFFERENTIAL/PLATELET
Abs Immature Granulocytes: 0.01 10*3/uL (ref 0.00–0.07)
Basophils Absolute: 0 10*3/uL (ref 0.0–0.1)
Basophils Relative: 0 %
Eosinophils Absolute: 0 10*3/uL (ref 0.0–0.5)
Eosinophils Relative: 1 %
HCT: 33.8 % — ABNORMAL LOW (ref 36.0–46.0)
Hemoglobin: 11.3 g/dL — ABNORMAL LOW (ref 12.0–15.0)
Immature Granulocytes: 0 %
Lymphocytes Relative: 43 %
Lymphs Abs: 1.4 10*3/uL (ref 0.7–4.0)
MCH: 31.7 pg (ref 26.0–34.0)
MCHC: 33.4 g/dL (ref 30.0–36.0)
MCV: 94.9 fL (ref 80.0–100.0)
Monocytes Absolute: 0.3 10*3/uL (ref 0.1–1.0)
Monocytes Relative: 10 %
Neutro Abs: 1.4 10*3/uL — ABNORMAL LOW (ref 1.7–7.7)
Neutrophils Relative %: 46 %
Platelets: 149 10*3/uL — ABNORMAL LOW (ref 150–400)
RBC: 3.56 MIL/uL — ABNORMAL LOW (ref 3.87–5.11)
RDW: 14 % (ref 11.5–15.5)
WBC: 3.1 10*3/uL — ABNORMAL LOW (ref 4.0–10.5)
nRBC: 0 % (ref 0.0–0.2)

## 2019-11-17 MED ORDER — FAMOTIDINE IN NACL 20-0.9 MG/50ML-% IV SOLN
20.0000 mg | Freq: Once | INTRAVENOUS | Status: AC
  Administered 2019-11-17: 20 mg via INTRAVENOUS
  Filled 2019-11-17: qty 50

## 2019-11-17 MED ORDER — PEGFILGRASTIM 6 MG/0.6ML ~~LOC~~ PSKT
6.0000 mg | PREFILLED_SYRINGE | Freq: Once | SUBCUTANEOUS | Status: AC
  Administered 2019-11-17: 6 mg via SUBCUTANEOUS
  Filled 2019-11-17: qty 0.6

## 2019-11-17 MED ORDER — SODIUM CHLORIDE 0.9 % IV SOLN
520.0000 mg | Freq: Once | INTRAVENOUS | Status: AC
  Administered 2019-11-17: 520 mg via INTRAVENOUS
  Filled 2019-11-17: qty 52

## 2019-11-17 MED ORDER — HEPARIN SOD (PORK) LOCK FLUSH 100 UNIT/ML IV SOLN
INTRAVENOUS | Status: AC
  Filled 2019-11-17: qty 5

## 2019-11-17 MED ORDER — SODIUM CHLORIDE 0.9 % IV SOLN
Freq: Once | INTRAVENOUS | Status: AC
  Filled 2019-11-17: qty 5

## 2019-11-17 MED ORDER — HEPARIN SOD (PORK) LOCK FLUSH 100 UNIT/ML IV SOLN
500.0000 [IU] | Freq: Once | INTRAVENOUS | Status: AC
  Administered 2019-11-17: 500 [IU] via INTRAVENOUS
  Filled 2019-11-17: qty 5

## 2019-11-17 MED ORDER — DIPHENHYDRAMINE HCL 50 MG/ML IJ SOLN
25.0000 mg | Freq: Once | INTRAMUSCULAR | Status: AC
  Administered 2019-11-17: 25 mg via INTRAVENOUS
  Filled 2019-11-17: qty 1

## 2019-11-17 MED ORDER — ALPRAZOLAM 0.5 MG PO TABS: 0.5000 mg | ORAL_TABLET | Freq: Two times a day (BID) | ORAL | 0 refills | Status: DC | PRN

## 2019-11-17 MED ORDER — SERTRALINE HCL 25 MG PO TABS
25.0000 mg | ORAL_TABLET | Freq: Every day | ORAL | 3 refills | Status: DC
Start: 2019-11-17 — End: 2019-12-11

## 2019-11-17 MED ORDER — PALONOSETRON HCL INJECTION 0.25 MG/5ML
0.2500 mg | Freq: Once | INTRAVENOUS | Status: AC
  Administered 2019-11-17: 0.25 mg via INTRAVENOUS
  Filled 2019-11-17: qty 5

## 2019-11-17 MED ORDER — SODIUM CHLORIDE 0.9 % IV SOLN
Freq: Once | INTRAVENOUS | Status: AC
  Filled 2019-11-17: qty 250

## 2019-11-17 MED ORDER — SODIUM CHLORIDE 0.9 % IV SOLN
175.0000 mg/m2 | Freq: Once | INTRAVENOUS | Status: AC
  Administered 2019-11-17: 288 mg via INTRAVENOUS
  Filled 2019-11-17: qty 48

## 2019-11-17 MED ORDER — SODIUM CHLORIDE 0.9% FLUSH
10.0000 mL | INTRAVENOUS | Status: DC | PRN
  Administered 2019-11-17: 10 mL via INTRAVENOUS
  Filled 2019-11-17: qty 10

## 2019-11-17 NOTE — Progress Notes (Signed)
Pt needs refill of xanax, and wants to talk about inc. In zoloft. No pain put her guts her a lot. She wears pad and depends when she goes out she poops in her pants and does not realize it. She Is concerned for poss. Inc. Risk of UTI. She has about 10 BM a day and some are tiny and sometimes maybe 2-3 inches long and they are soft-not hard balls of stool. eats good

## 2019-11-17 NOTE — Progress Notes (Signed)
ANC: 1400. MD, Dr. Janese Banks, notified and already aware. Per MD order: proceed with scheduled Taxol and Carboplatin treatment today; MD adding on for patient to receive Neulasta Onpro post treatment today.

## 2019-11-18 LAB — CA 125: Cancer Antigen (CA) 125: 91.2 U/mL — ABNORMAL HIGH (ref 0.0–38.1)

## 2019-11-20 ENCOUNTER — Encounter: Payer: Self-pay | Admitting: Oncology

## 2019-11-20 NOTE — Progress Notes (Signed)
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  CT scan chest-bilateral lung nodules; 3.8 cm soft tissue mass adjacent to GE junction  # DEC 11TH 2020-CARBO-Taxol [chemo consent] x 3 cycles; February 2021-improvement of the peritoneal carcinomatosis; lung lesions.. 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  Partial colectomy  # #Genetic counseling-s/p NEG myRiskMyriad.   # Thyroid cancer at 41y s/p thyroidectomy [no RAIU]  # NGS/MOLECULAR TESTS: My risk myriad negative; HRD negative; NGS-P  # PALLIATIVE CARE EVALUATION:P  # PAIN MANAGEMENT: P   DIAGNOSIS: Bilateral ovarian cancer/tube/peritoneal  STAGE: IV   ;  GOALS: Control  CURRENT/MOST RECENT THERAPY : Carbotaxol [C]    Peritoneal carcinoma ([place])  06/23/2019 Initial Diagnosis   Peritoneal carcinoma ([place])   06/30/2019 -  Chemotherapy   The patient had palonosetron (ALOXI) injection 0.25 mg, 0.25 mg, Intravenous,  Once, 6 of 6 cycles Administration: 0.25 mg (06/30/2019), 0.25 mg (07/24/2019), 0.25 mg (08/14/2019), 0.25 mg  (10/06/2019), 0.25 mg (10/27/2019), 0.25 mg (11/17/2019) pegfilgrastim (NEULASTA ONPRO KIT) injection 6 mg, 6 mg, Subcutaneous, Once, 1 of 1 cycle Administration: 6 mg (11/17/2019) CARBOplatin (PARAPLATIN) 570 mg in sodium chloride 0.9 % 250 mL chemo infusion, 570 mg (100 % of original dose 567 mg), Intravenous,  Once, 6 of 6 cycles Dose modification:   (original dose 567 mg, Cycle 1) Administration: 570 mg (06/30/2019), 570 mg (07/24/2019), 570 mg (08/14/2019), 520 mg (10/27/2019), 520 mg (11/17/2019) fosaprepitant (EMEND) 150 mg in sodium chloride 0.9 % 145 mL IVPB, 150 mg, Intravenous,  Once, 1 of 1 cycle PACLitaxel (TAXOL) 288 mg in sodium chloride 0.9 % 250 mL chemo infusion (> 69m/m2), 175 mg/m2 = 288 mg, Intravenous,  Once, 6 of 6 cycles Administration: 288 mg (06/30/2019), 288 mg (07/24/2019), 288 mg (08/14/2019), 288 mg (10/06/2019), 288 mg (10/27/2019), 288 mg (11/17/2019) fosaprepitant (EMEND) 150 mg, dexamethasone (DECADRON) 12 mg in sodium chloride 0.9 % 145 mL IVPB, , Intravenous,  Once, 5 of 5 cycles Administration:  (07/24/2019),  (08/14/2019),  (10/06/2019),  (10/27/2019),  (11/17/2019)  for chemotherapy treatment.     Genetic Testing   Negative genetic testing. No pathogenic variants identified on the Myriad MyRisk+HRD. The report date is 10/02/2019.  The [facility] panel offered by [employer] sequencing and deletion/duplication testing of the following 35 genes: APC, ATM, AXIN2, BARD1, BMPR1A, BRCA1, BRCA2, BRIP1, CHD1, CDK4, CDKN2A, CHEK2, EPCAM (large rearrangement only), HOXB13, GALNT12, MLH1, MSH2, MSH3, MSH6, MUTYH, NBN, NTHL1, PALB2, PMS2, PTEN, RAD51C, RAD51D, RNF43, RPS20, SMAD4, STK11, and TP53. Sequencing was performed for select regions of POLE and POLD1, and large rearrangement analysis was performed for select regions of GREM1.       Interval history-patient has been overwhelmed during this course of chemotherapy.  She has been having uncontrolled anxiety  and  the present dose of Zoloft and Xanax is not helping her much.  Reports some tingling numbness in her hands and feet which is mild and essentially stable.She has mild nausea but no significant vomiting.  ECOG PS- 1 Pain scale- 0   Review of systems- Review of Systems  Constitutional: Positive for malaise/fatigue. Negative for chills, fever and weight loss.  HENT: Negative for congestion, ear discharge and nosebleeds.   Eyes: Negative for blurred vision.  Respiratory: Negative for cough, hemoptysis, sputum production, shortness of breath and wheezing.   Cardiovascular: Negative for chest pain, palpitations, orthopnea and claudication.  Gastrointestinal: Positive for nausea. Negative for abdominal pain, blood in stool, constipation, diarrhea, heartburn, melena and vomiting.  Genitourinary: Negative for dysuria, flank pain, frequency, hematuria and urgency.  Musculoskeletal: Negative for back pain, joint pain and myalgias.  Skin: Negative for rash.  Neurological: Positive for sensory change (Peripheral neuropathy). Negative for dizziness, tingling, focal weakness, seizures, weakness and headaches.  Endo/Heme/Allergies: Does not bruise/bleed easily.  Psychiatric/Behavioral: Negative for depression and suicidal ideas. The patient is nervous/anxious. The patient does not have insomnia.       No Known Allergies   Past Medical History:  Diagnosis Date  . Cancer ([place])   . Family history of breast cancer   . Family history of lung cancer   . HYPERLIPIDEMIA 04/09/2008   Qualifier: Diagnosis of  By: Wynona Luna   . HYPOTHYROIDISM 04/09/2008   Qualifier: Diagnosis of  By: Wynona Luna   . INSOMNIA, CHRONIC 08/21/2010   Qualifier: Diagnosis of  By: Wynona Luna   . PERSONAL HISTORY MALIGNANT NEOPLASM THYROID 08/21/2010   Qualifier: Diagnosis of  By: Wynona Luna      Past Surgical History:  Procedure Laterality Date  . IR IMAGING GUIDED PORT INSERTION  07/18/2019  .  THYROIDECTOMY, PARTIAL      Social History   Socioeconomic History  . Marital status: Married    Spouse name: Not on file  . Number of children: Not on file  . Years of education: Not on file  . Highest education level: Not on file  Occupational History  . Not on file  Tobacco Use  . Smoking status: Never Smoker  . Smokeless tobacco: Never Used  Substance and Sexual Activity  . Alcohol use: Not Currently  . Drug use: No  . Sexual activity: Yes  Other Topics Concern  . 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 Difficulty of Paying Living Expenses:   Food Insecurity:   . Worried About Charity fundraiser in the Last Year:   . Arboriculturist in the Last Year:   Transportation Needs:   . Film/video editor (Medical):   Marland Kitchen Lack of Transportation (Non-Medical):   Physical Activity:   . Days of Exercise per Week:   . Minutes of Exercise per Session:   Stress:   . Feeling of Stress :   Social Connections:   . Frequency of Communication with Friends and Family:   . Frequency of Social Gatherings with Friends and Family:   . Attends Religious Services:   . Active Member of Clubs or Organizations:   . Attends Archivist Meetings:   Marland Kitchen Marital Status:  Intimate Partner Violence:   . Fear of Current or Ex-Partner:   . Emotionally Abused:   Marland Kitchen Physically Abused:   . Sexually Abused:     Family History  Problem Relation Age of Onset  . Arthritis Mother   . Hyperlipidemia Mother   . Hyperlipidemia Father   . Diabetes Father   . Hypertension Father   . Heart disease Father 90       CAD  . Lung cancer Father   . Breast cancer Maternal Grandmother        in 62s  . Cancer Maternal Aunt        unk type  . Cancer Maternal Uncle        unk type  . Cancer  Paternal Uncle        unk type, d. 48s  . Skin cancer Daughter        basal cell      Current Outpatient Medications:  .  ALPRAZolam (XANAX) 0.5 MG tablet, Take 1 tablet (0.5 mg total) by mouth 2 (two) times daily as needed for anxiety., Disp: 60 tablet, Rfl: 0 .  Artificial Tear Solution (20/20 ARTIFICIAL TEARS OP), Apply 2 drops to eye daily as needed. , Disp: , Rfl:  .  Ascorbic Acid (VITAMIN C) 1000 MG tablet, Take 1,000 mg by mouth in the morning and at bedtime. , Disp: , Rfl:  .  Cholecalciferol (VITAMIN D3) 50 MCG (2000 UT) TABS, Take 1 tablet by mouth in the morning and at bedtime., Disp: , Rfl:  .  lidocaine-prilocaine (EMLA) cream, Apply generously to the port area 45-60 mins prior., Disp: 30 g, Rfl: 0 .  MELATONIN PO, Take 5 mg by mouth daily as needed. , Disp: , Rfl:  .  Omega-3 Fatty Acids (FISH OIL) 1000 MG CAPS, Take 2 capsules by mouth daily., Disp: , Rfl:  .  ondansetron (ZOFRAN) 8 MG tablet, One pill every 8 hours as needed for nausea/vomitting., Disp: 40 tablet, Rfl: 1 .  polyethylene glycol (MIRALAX / GLYCOLAX) 17 g packet, Take 17 g by mouth daily as needed. , Disp: , Rfl:  .  Probiotic Product (PROBIOTIC-10) CHEW, Chew 1 capsule by mouth., Disp: , Rfl:  .  promethazine (PHENERGAN) 25 MG tablet, Take 1 tablet (25 mg total) by mouth every 6 (six) hours as needed for nausea or vomiting., Disp: 30 tablet, Rfl: 0 .  sertraline (ZOLOFT) 50 MG tablet, TAKE 1 TABLET BY MOUTH EVERY DAY, Disp: 90 tablet, Rfl: 2 .  SYNTHROID 75 MCG tablet, Take 1 tablet (75 mcg total) by mouth daily before breakfast., Disp: 90 tablet, Rfl: 3 .  zolpidem (AMBIEN) 10 MG tablet, TAKE 1 TABLET BY MOUTH EVERY DAY AT BEDTIME AS NEEDED FOR SLEEP, Disp: 30 tablet, Rfl: 3 .  HYDROcodone-acetaminophen (NORCO/VICODIN) 5-325 MG tablet, Take 1 tablet by mouth every 6 (six) hours as needed for severe pain. (Patient not taking: Reported on 11/17/2019), Disp: 45 tablet, Rfl: 0 .  prochlorperazine (COMPAZINE) 10 MG  tablet, Take 1 tablet (10 mg total) by mouth every 6 (six) hours as needed for nausea or vomiting. (Patient not taking: Reported on 11/17/2019), Disp: 40 tablet, Rfl: 1 .  sertraline (ZOLOFT) 25 MG tablet, Take 1 tablet (25 mg total) by mouth daily., Disp: 30 tablet, Rfl: 3 .  traMADol (ULTRAM) 50 MG tablet, Take 1 tablet (50 mg total) by mouth every 6 (six) hours as needed for moderate pain or severe pain. (Patient not taking: Reported on 11/17/2019), Disp:  30 tablet, Rfl: 0  Physical exam:  Vitals:   11/17/19 0845  BP: 93/75  Pulse: 96  Resp: 16  Temp: (!) 97.3 F (36.3 C)  TempSrc: Tympanic  Weight: 118 lb 6.4 oz (53.7 kg)   Physical Exam Constitutional:      General: She is not in acute distress. Cardiovascular:     Rate and Rhythm: Normal rate and regular rhythm.     Heart sounds: Normal heart sounds.  Pulmonary:     Effort: Pulmonary effort is normal.     Breath sounds: Normal breath sounds.  Abdominal:     General: Bowel sounds are normal.     Palpations: Abdomen is soft.  Skin:    General: Skin is warm and dry.  Neurological:     Mental Status: She is alert and oriented to person, place, and time.      CMP Latest Ref Rng & Units 11/17/2019  Glucose 70 - 99 mg/dL 128(H)  BUN 8 - 23 mg/dL 20  Creatinine 0.44 - 1.00 mg/dL 0.64  Sodium 135 - 145 mmol/L 139  Potassium 3.5 - 5.1 mmol/L 4.1  Chloride 98 - 111 mmol/L 103  CO2 22 - 32 mmol/L 27  Calcium 8.9 - 10.3 mg/dL 9.3  Total Protein 6.5 - 8.1 g/dL 7.1  Total Bilirubin 0.3 - 1.2 mg/dL 0.6  Alkaline Phos 38 - 126 U/L 121  AST 15 - 41 U/L 36  ALT 0 - 44 U/L 45(H)   CBC Latest Ref Rng & Units 11/17/2019  WBC 4.0 - 10.5 K/uL 3.1(L)  Hemoglobin 12.0 - 15.0 g/dL 11.3(L)  Hematocrit 36.0 - 46.0 % 33.8(L)  Platelets 150 - 400 K/uL 149(L)      Assessment and plan- Patient is a 64 y.o. female with high-grade serous carcinoma of GYN origin and peritoneal carcinomatosis.  She is here for on treatment assessment prior to  cycle 6 of carbotaxol chemotherapy.  1.  Patient's white cell count is 3.1 today with an ANC of 1.4.  This is the lowest it has ever been.  She has been getting carboplatin at AUC 6.  I would therefore like to give her chemotherapy today with on pro-Neulasta support to reduce the complications of possible neutropenic fever.  She has mild thrombocytopenia but counts are otherwise acceptable for chemotherapy today.    2.  After this chemotherapy decisions need to be made regarding maintenance therapy.  Discussed with patient that both maintenance bevacizumab which showed improvement in progression free survival with GOG 218 trial versus niraparib which has a PFS benefit of about 4 to 5 months in HRD negative patients are both acceptable options.  They have not been compared head-to-head.  Bevacizumab would be an infusion which is given every 3 weeks up to month 15 whereas niraparib will be given until progression or toxicity.  Patient would like to meet with Dr. Theora Gianotti before making these decisions.  3. Patient has had a mild increase in her CA-125 from a level of 32.2 in January 20 21-77 in April 2021 91.2 presently.  I will therefore be repeating CT chest abdomen and pelvis with contrast to assess response to treatment so far  4.  Patient wanted to know what her options would be if she has a recurrence of her cancer say after 1 to 2 years.  I explained to her that typically if recurrence occurs after 6 months of initial chemotherapy the standard of care would be to rechallenge with platinum based chemotherapy.  Patient  was extremely upset to hear about this as she has had a hard time going through chemotherapy the first time.  I discussed with her that bevacizumab or Avastin is usually added to chemotherapy at recurrence but not given as a single agent.  If she had a good response to treatment it could be continued as monotherapy after a few cycles of chemotherapy.   But again we are not at that situation at  this time.  We will need to see what her CT scan show to decide about maintenance treatment down the line   Also discussed the fact that her CT shows stable disease but CA-125 is rising , we do not change treatment unless there is evidence of clinical or radiological progression.   5.  Anxiety: Will increase the dose of Zoloft from 50 to 75 mg and she will continue taking as needed Xanax at present dose  6.  Chemo-induced peripheral neuropathy: Mild grade 1.  Continue to monitor  I will see her back in 3 weeks time with CBC with differential CMP and CA-125 with CT chest abdomen and pelvis with contrast prior   Visit Diagnosis 1. Malignant neoplasm of ovary, unspecified laterality ([place])   2. Anxiety state   3. Peritoneal carcinoma ([place])   4. Encounter for antineoplastic chemotherapy   5. Chemotherapy induced neutropenia ([facility])   6. Chemotherapy-induced peripheral neuropathy ([facility])      Dr. Randa Evens, MD, MPH [facility] [phone] 11/20/2019 3:44 PM

## 2019-11-21 ENCOUNTER — Encounter: Payer: Self-pay | Admitting: Pharmacy Technician

## 2019-11-21 NOTE — Progress Notes (Signed)
Patient has been approved for drug assistance by [employer] for Mvasi. The enrollment period is from 11/21/19-11/20/20 based on self pay. First DOS covered is TBA.

## 2019-11-21 NOTE — Progress Notes (Signed)
Patient has been approved for drug assistance by [employer] for Neulasta. The enrollment period is from 11/21/19-11/20/20 based on self pay. First DOS covered is 11/17/19 with retro.

## 2019-11-27 ENCOUNTER — Telehealth: Payer: Self-pay

## 2019-11-27 NOTE — Telephone Encounter (Signed)
Received message from [facility] regarding a triage call from Ms. Amanda Pena. Spoke with Dr. Janese Pena. At her last visit she was offered a GI consult and was again offered today by myself. Amanda Pena and wants Dr. Theora Pena to review her message as she reports this is a new type of drainage. I have sent message to [facility] regarding her request. If it is determined that she needs to be seen before her 5-19 appointment with gyn onc at [facility], Amanda Pena is willing to go to [facility] clinic for evaluation. Encouraged Amanda Pena to call back if she has not heard from the [facility] team.

## 2019-11-28 ENCOUNTER — Telehealth: Payer: Self-pay

## 2019-11-28 NOTE — Telephone Encounter (Signed)
Call placed to Ms. Fluet to follow up regarding symptoms. She has a phone visit scheduled for tomorrow with [place] Smith NP at [facility] gyn onc. Dr Fransisca Connors will also be in clinic on 5/12 and an appointment was offered. She will speak with [place] first and keep [place] updated if they feel she  needs to be seen before 5/19.

## 2019-12-01 ENCOUNTER — Ambulatory Visit
Admission: RE | Admit: 2019-12-01 | Discharge: 2019-12-01 | Disposition: A | Discharge status: Home / Self Care | Payer: Self-pay | Source: Ambulatory Visit | Attending: Oncology | Admitting: Oncology

## 2019-12-01 ENCOUNTER — Encounter: Payer: Self-pay | Admitting: Oncology

## 2019-12-01 ENCOUNTER — Other Ambulatory Visit: Payer: Self-pay

## 2019-12-01 DIAGNOSIS — C569 Malignant neoplasm of unspecified ovary: Secondary | ICD-10-CM | POA: Insufficient documentation

## 2019-12-01 MED ORDER — IOHEXOL 300 MG/ML  SOLN
85.0000 mL | Freq: Once | INTRAMUSCULAR | Status: AC | PRN
  Administered 2019-12-01: 85 mL via INTRAVENOUS

## 2019-12-04 ENCOUNTER — Inpatient Hospital Stay: Payer: Self-pay | Attending: Oncology | Admitting: Oncology

## 2019-12-04 ENCOUNTER — Encounter: Payer: Self-pay | Admitting: Oncology

## 2019-12-04 ENCOUNTER — Other Ambulatory Visit: Payer: Self-pay

## 2019-12-04 DIAGNOSIS — Z79899 Other long term (current) drug therapy: Secondary | ICD-10-CM | POA: Insufficient documentation

## 2019-12-04 DIAGNOSIS — C7800 Secondary malignant neoplasm of unspecified lung: Secondary | ICD-10-CM | POA: Insufficient documentation

## 2019-12-04 DIAGNOSIS — C562 Malignant neoplasm of left ovary: Secondary | ICD-10-CM | POA: Insufficient documentation

## 2019-12-04 DIAGNOSIS — C786 Secondary malignant neoplasm of retroperitoneum and peritoneum: Secondary | ICD-10-CM | POA: Insufficient documentation

## 2019-12-04 DIAGNOSIS — Z7189 Other specified counseling: Secondary | ICD-10-CM

## 2019-12-04 DIAGNOSIS — Z90722 Acquired absence of ovaries, bilateral: Secondary | ICD-10-CM | POA: Insufficient documentation

## 2019-12-04 DIAGNOSIS — C482 Malignant neoplasm of peritoneum, unspecified: Secondary | ICD-10-CM

## 2019-12-04 DIAGNOSIS — C561 Malignant neoplasm of right ovary: Secondary | ICD-10-CM | POA: Insufficient documentation

## 2019-12-04 DIAGNOSIS — Z9079 Acquired absence of other genital organ(s): Secondary | ICD-10-CM | POA: Insufficient documentation

## 2019-12-04 DIAGNOSIS — Z85850 Personal history of malignant neoplasm of thyroid: Secondary | ICD-10-CM | POA: Insufficient documentation

## 2019-12-04 DIAGNOSIS — C569 Malignant neoplasm of unspecified ovary: Secondary | ICD-10-CM

## 2019-12-04 DIAGNOSIS — Z90710 Acquired absence of both cervix and uterus: Secondary | ICD-10-CM | POA: Insufficient documentation

## 2019-12-04 DIAGNOSIS — Z9221 Personal history of antineoplastic chemotherapy: Secondary | ICD-10-CM | POA: Insufficient documentation

## 2019-12-04 NOTE — Progress Notes (Signed)
Pt was added on due to her ct results. And pt did not want to wait to get results because she has anxiety

## 2019-12-06 ENCOUNTER — Other Ambulatory Visit: Payer: Self-pay

## 2019-12-06 ENCOUNTER — Inpatient Hospital Stay (HOSPITAL_BASED_OUTPATIENT_CLINIC_OR_DEPARTMENT_OTHER): Payer: Self-pay | Admitting: Obstetrics and Gynecology

## 2019-12-06 VITALS — BP 112/56 | HR 88 | Temp 99.1°F | Resp 16 | Ht 66.0 in | Wt 117.8 lb

## 2019-12-06 DIAGNOSIS — C562 Malignant neoplasm of left ovary: Secondary | ICD-10-CM

## 2019-12-06 DIAGNOSIS — C561 Malignant neoplasm of right ovary: Secondary | ICD-10-CM

## 2019-12-06 DIAGNOSIS — C569 Malignant neoplasm of unspecified ovary: Secondary | ICD-10-CM

## 2019-12-06 DIAGNOSIS — C786 Secondary malignant neoplasm of retroperitoneum and peritoneum: Secondary | ICD-10-CM

## 2019-12-06 NOTE — Progress Notes (Signed)
Pt has had last week 4 days In a row that she had looks like clear water come out from rectum and then stopped and she had a good BM and then none since then. No vaginal issues

## 2019-12-06 NOTE — Progress Notes (Signed)
I connected with Amanda Pena on 12/06/19 at  2:00 PM EDT by video enabled telemedicine visit and verified that I am speaking with the correct person using two identifiers.   I discussed the limitations, risks, security and privacy concerns of performing an evaluation and management service by telemedicine and the availability of in-person appointments. I also discussed with the patient that there may be a patient responsible charge related to this service. The patient expressed understanding and agreed to proceed.  Other persons participating in the visit and their role in the encounter:  Patients daughter  Patient's location:  home Provider's location:  work  Risk analyst Complaint: Discuss CT scan results and further management  History of present illness:  Oncology History Overview Note  #November 2020-omental caking/peritoneal carcinomatosis; bilateral adnexal masses 4-5 cm in size; multiple lung nodules;   # dec 8th 2020-omental biopsy; positive for high-grade serous adenocarcinoma; GYN origin.  CT scan chest-bilateral lung nodules; 3.8 cm soft tissue mass adjacent to GE junction  # DEC 11TH 2020-CARBO-Taxol [chemo consent] x 3 cycles; February 2021-improvement of the peritoneal carcinomatosis; lung lesions.. 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  Partial colectomy  # #Genetic counseling-s/p NEG myRiskMyriad.   # Thyroid cancer at 69y s/p thyroidectomy [no RAIU]  # NGS/MOLECULAR TESTS: My risk myriad negative; HRD negative; NGS-P  # PALLIATIVE CARE EVALUATION:P  # PAIN MANAGEMENT: P   DIAGNOSIS: Bilateral ovarian cancer/tube/peritoneal  STAGE: IV   ;  GOALS: Control  CURRENT/MOST RECENT THERAPY : Carbotaxol [C]    Peritoneal carcinoma ([place])  06/23/2019 Initial Diagnosis   Peritoneal carcinoma ([place])   06/30/2019 -  Chemotherapy   The patient had palonosetron (ALOXI) injection  0.25 mg, 0.25 mg, Intravenous,  Once, 6 of 6 cycles Administration: 0.25 mg (06/30/2019), 0.25 mg (07/24/2019), 0.25 mg (08/14/2019), 0.25 mg (10/06/2019), 0.25 mg (10/27/2019), 0.25 mg (11/17/2019) pegfilgrastim (NEULASTA ONPRO KIT) injection 6 mg, 6 mg, Subcutaneous, Once, 1 of 1 cycle Administration: 6 mg (11/17/2019) CARBOplatin (PARAPLATIN) 570 mg in sodium chloride 0.9 % 250 mL chemo infusion, 570 mg (100 % of original dose 567 mg), Intravenous,  Once, 6 of 6 cycles Dose modification:   (original dose 567 mg, Cycle 1) Administration: 570 mg (06/30/2019), 570 mg (07/24/2019), 570 mg (08/14/2019), 520 mg (10/27/2019), 520 mg (11/17/2019) fosaprepitant (EMEND) 150 mg in sodium chloride 0.9 % 145 mL IVPB, 150 mg, Intravenous,  Once, 1 of 1 cycle PACLitaxel (TAXOL) 288 mg in sodium chloride 0.9 % 250 mL chemo infusion (> 43m/m2), 175 mg/m2 = 288 mg, Intravenous,  Once, 6 of 6 cycles Administration: 288 mg (06/30/2019), 288 mg (07/24/2019), 288 mg (08/14/2019), 288 mg (10/06/2019), 288 mg (10/27/2019), 288 mg (11/17/2019) fosaprepitant (EMEND) 150 mg, dexamethasone (DECADRON) 12 mg in sodium chloride 0.9 % 145 mL IVPB, , Intravenous,  Once, 5 of 5 cycles Administration:  (07/24/2019),  (08/14/2019),  (10/06/2019),  (10/27/2019),  (11/17/2019)  for chemotherapy treatment.     Genetic Testing   Negative genetic testing. No pathogenic variants identified on the Myriad MyRisk+HRD. The report date is 10/02/2019.  The [facility] panel offered by [employer] sequencing and deletion/duplication testing of the following 35 genes: APC, ATM, AXIN2, BARD1, BMPR1A, BRCA1, BRCA2, BRIP1, CHD1, CDK4, CDKN2A, CHEK2, EPCAM (large rearrangement only), HOXB13, GALNT12, MLH1, MSH2, MSH3, MSH6, MUTYH, NBN, NTHL1, PALB2, PMS2, PTEN, RAD51C, RAD51D, RNF43, RPS20, SMAD4, STK11, and TP53. Sequencing was performed for select regions of POLE and POLD1, and large rearrangement analysis  was performed for select regions of GREM1.      Patient completed 6 cycle of carbo/taxol chemotherapy on 11/17/19. She was optimally debulked  Interval history: Patient has ongoing symptoms of fatigue and anxiety.  She also reports intermittent episodes of small-volume rectal incontinence for which she has to wear pads at all times.    Review of Systems  Constitutional: Positive for malaise/fatigue. Negative for chills, fever and weight loss.  HENT: Negative for congestion, ear discharge and nosebleeds.   Eyes: Negative for blurred vision.  Respiratory: Negative for cough, hemoptysis, sputum production, shortness of breath and wheezing.   Cardiovascular: Negative for chest pain, palpitations, orthopnea and claudication.  Gastrointestinal: Negative for abdominal pain, blood in stool, constipation, diarrhea, heartburn, melena, nausea and vomiting.       Rectal incontinence  Genitourinary: Negative for dysuria, flank pain, frequency, hematuria and urgency.  Musculoskeletal: Negative for back pain, joint pain and myalgias.  Skin: Negative for rash.  Neurological: Negative for dizziness, tingling, focal weakness, seizures, weakness and headaches.  Endo/Heme/Allergies: Does not bruise/bleed easily.  Psychiatric/Behavioral: Negative for depression and suicidal ideas. The patient is nervous/anxious. The patient does not have insomnia.     No Known Allergies  Past Medical History:  Diagnosis Date  . Cancer ([place])   . Family history of breast cancer   . Family history of lung cancer   . HYPERLIPIDEMIA 04/09/2008   Qualifier: Diagnosis of  By: Wynona Luna   . HYPOTHYROIDISM 04/09/2008   Qualifier: Diagnosis of  By: Wynona Luna   . INSOMNIA, CHRONIC 08/21/2010   Qualifier: Diagnosis of  By: Wynona Luna   . 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 IR IMAGING GUIDED PORT INSERTION  07/18/2019  .  THYROIDECTOMY, PARTIAL      Social History   Socioeconomic History  . Marital status: Married    Spouse name: Not on file  . Number of children: Not on file  . Years of education: Not on file  . Highest education level: Not on file  Occupational History  . Not on file  Tobacco Use  . Smoking status: Never Smoker  . Smokeless tobacco: Never Used  Substance and Sexual Activity  . Alcohol use: Not Currently  . Drug use: No  . Sexual activity: Yes  Other Topics Concern  . 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 Difficulty of Paying Living Expenses:   Food Insecurity:   . Worried About Charity fundraiser in the Last Year:   . Arboriculturist in the Last Year:   Transportation Needs:   . Film/video editor (Medical):   Marland Kitchen Lack of Transportation (Non-Medical):   Physical Activity:   . Days of Exercise per Week:   . Minutes of Exercise per Session:   Stress:   . Feeling of Stress :   Social Connections:   . Frequency of Communication with Friends and Family:   . Frequency of Social Gatherings with Friends and Family:   . Attends Religious Services:   . Active Member of Clubs or Organizations:   . Attends Archivist Meetings:   .  Marital Status:   Intimate Partner Violence:   . Fear of Current or Ex-Partner:   . Emotionally Abused:   Marland Kitchen Physically Abused:   . Sexually Abused:     Family History  Problem Relation Age of Onset  . Arthritis Mother   . Hyperlipidemia Mother   . Hyperlipidemia Father   . Diabetes Father   . Hypertension Father   . Heart disease Father 29       CAD  . Lung cancer Father   . Breast cancer Maternal Grandmother        in 30s  . Cancer Maternal Aunt        unk type  . Cancer Maternal Uncle        unk type  . Cancer  Paternal Uncle        unk type, d. 8s  . Skin cancer Daughter        basal cell      Current Outpatient Medications:  .  ALPRAZolam (XANAX) 0.5 MG tablet, Take 1 tablet (0.5 mg total) by mouth 2 (two) times daily as needed for anxiety., Disp: 60 tablet, Rfl: 0 .  Artificial Tear Solution (20/20 ARTIFICIAL TEARS OP), Apply 2 drops to eye daily as needed. , Disp: , Rfl:  .  Ascorbic Acid (VITAMIN C) 1000 MG tablet, Take 1,000 mg by mouth in the morning and at bedtime. , Disp: , Rfl:  .  Cholecalciferol (VITAMIN D3) 50 MCG (2000 UT) TABS, Take 1 tablet by mouth in the morning and at bedtime., Disp: , Rfl:  .  lidocaine-prilocaine (EMLA) cream, Apply generously to the port area 45-60 mins prior., Disp: 30 g, Rfl: 0 .  MELATONIN PO, Take 5 mg by mouth daily as needed. , Disp: , Rfl:  .  Omega-3 Fatty Acids (FISH OIL) 1000 MG CAPS, Take 2 capsules by mouth daily., Disp: , Rfl:  .  ondansetron (ZOFRAN) 8 MG tablet, One pill every 8 hours as needed for nausea/vomitting., Disp: 40 tablet, Rfl: 1 .  polyethylene glycol (MIRALAX / GLYCOLAX) 17 g packet, Take 17 g by mouth daily as needed. , Disp: , Rfl:  .  Probiotic Product (PROBIOTIC-10) CHEW, Chew 1 capsule by mouth., Disp: , Rfl:  .  prochlorperazine (COMPAZINE) 10 MG tablet, Take 1 tablet (10 mg total) by mouth every 6 (six) hours as needed for nausea or vomiting., Disp: 40 tablet, Rfl: 1 .  promethazine (PHENERGAN) 25 MG tablet, Take 1 tablet (25 mg total) by mouth every 6 (six) hours as needed for nausea or vomiting., Disp: 30 tablet, Rfl: 0 .  sertraline (ZOLOFT) 25 MG tablet, Take 1 tablet (25 mg total) by mouth daily., Disp: 30 tablet, Rfl: 3 .  sertraline (ZOLOFT) 50 MG tablet, TAKE 1 TABLET BY MOUTH EVERY DAY, Disp: 90 tablet, Rfl: 2 .  SYNTHROID 75 MCG tablet, Take 1 tablet (75 mcg total) by mouth daily before breakfast., Disp: 90 tablet, Rfl: 3 .  zolpidem (AMBIEN) 10 MG tablet, TAKE 1 TABLET BY MOUTH EVERY DAY AT BEDTIME AS NEEDED FOR  SLEEP, Disp: 30 tablet, Rfl: 3 .  HYDROcodone-acetaminophen (NORCO/VICODIN) 5-325 MG tablet, Take 1 tablet by mouth every 6 (six) hours as needed for severe pain. (Patient not taking: Reported on 11/17/2019), Disp: 45 tablet, Rfl: 0 .  traMADol (ULTRAM) 50 MG tablet, Take 1 tablet (50 mg total) by mouth every 6 (six) hours as needed for moderate pain or severe pain. (Patient not taking: Reported on 11/17/2019), Disp: 30 tablet,  Rfl: 0  CT Chest W Contrast  Result Date: 12/02/2019 CLINICAL DATA:  Restaging ovarian cancer diagnosed 6 months ago. Post total hysterectomy, appendectomy, omentectomy and partial colectomy. Chemotherapy ongoing. EXAM: CT CHEST, ABDOMEN, AND PELVIS WITH CONTRAST TECHNIQUE: Multidetector CT imaging of the chest, abdomen and pelvis was performed following the standard protocol during bolus administration of intravenous contrast. CONTRAST:  41m OMNIPAQUE IOHEXOL 300 MG/ML  SOLN COMPARISON:  CT 10/02/2019 and 08/29/2019. FINDINGS: CT CHEST FINDINGS Cardiovascular: Minimal coronary artery atherosclerosis. No acute vascular findings. Right IJ Port-A-Cath extends to the superior cavoatrial junction. The heart size is normal. There is no pericardial effusion. Mediastinum/Nodes: There are no enlarged mediastinal, hilar or axillary lymph nodes. Probable previous resection of the left thyroid lobe. The trachea and esophagus demonstrate no significant findings. Lungs/Pleura: There is no pleural effusion. The previously demonstrated small ground-glass opacities within the right lower lobe demonstrate continued improvement, largest measuring 4 mm on image 106/3. Additional scattered small nodules bilaterally are unchanged. There is a small perifissural nodule along the minor fissure on image 73/3. No new or enlarging nodules. Musculoskeletal/Chest wall: No chest wall mass or suspicious osseous findings. Mild thoracolumbar scoliosis. CT ABDOMEN AND PELVIS FINDINGS Hepatobiliary: The liver is normal in  density without suspicious focal abnormality. No evidence of gallstones, gallbladder wall thickening or biliary dilatation. Pancreas: Unremarkable. No pancreatic ductal dilatation or surrounding inflammatory changes. Spleen: Normal in size without focal abnormality. Adrenals/Urinary Tract: Both adrenal glands appear normal. Stable small cyst in the anterior interpolar region of the right kidney. The kidneys otherwise appear normal. No evidence of urinary tract calculus or hydronephrosis. The bladder appears unremarkable for its degree of distention. Stomach/Bowel: No evidence of bowel wall thickening, distention or surrounding inflammatory change. The stomach is incompletely distended with mucosal redundancy and a possible small diverticulum in the posterior fundus. A large amount of stool is present throughout the colon. Rectal anastomosis appears patent. Vascular/Lymphatic: There are no enlarged abdominal or pelvic lymph nodes. Mild aortic and branch vessel atherosclerosis. The portal, superior mesenteric and splenic veins are patent. Reproductive: Hysterectomy. No adnexal mass. Other: Stable postsurgical changes within the anterior abdominal wall. Low-density fluid collection to the left of the rectal anastomosis measures 2.2 cm on image 107/2, previously 1.4 cm. This demonstrates no definite solid components or surrounding inflammation. There is increased peritoneal nodularity superior to the transverse colon and adjacent to the pancreatic tail, most notable on sagittal images 78, 97 and 134 of series 6, suspicious for progressive peritoneal carcinomatosis. Stable mild nodular thickening in the subhepatic space, also best seen on the sagittal images. No peritoneal implants seen within the pelvis. Musculoskeletal: No acute or significant osseous findings. IMPRESSION: 1. Increased peritoneal nodularity superior to the transverse colon and adjacent to the pancreatic tail suspicious for progressive peritoneal  carcinomatosis. Stable nodular thickening in the subhepatic space. 2. Fluid collection to the left of the rectal anastomosis has mildly enlarged, although demonstrates no solid components. This is indeterminate. No generalized ascites. 3. No other evidence of metastatic disease within the chest, abdomen or pelvis. 4. Further improvement in previously demonstrated small ground-glass pulmonary opacities within the right lower lobe, likely inflammatory. Additional small pulmonary nodules are unchanged. 5. Large amount of stool throughout the colon consistent with constipation. 6. Aortic Atherosclerosis (ICD10-I70.0). Electronically Signed   By: WRichardean SaleM.D.   On: 12/02/2019 11:36   CT Abdomen Pelvis W Contrast  Result Date: 12/02/2019 CLINICAL DATA:  Restaging ovarian cancer diagnosed 6 months ago. Post total hysterectomy, appendectomy,  omentectomy and partial colectomy. Chemotherapy ongoing. EXAM: CT CHEST, ABDOMEN, AND PELVIS WITH CONTRAST TECHNIQUE: Multidetector CT imaging of the chest, abdomen and pelvis was performed following the standard protocol during bolus administration of intravenous contrast. CONTRAST:  72m OMNIPAQUE IOHEXOL 300 MG/ML  SOLN COMPARISON:  CT 10/02/2019 and 08/29/2019. FINDINGS: CT CHEST FINDINGS Cardiovascular: Minimal coronary artery atherosclerosis. No acute vascular findings. Right IJ Port-A-Cath extends to the superior cavoatrial junction. The heart size is normal. There is no pericardial effusion. Mediastinum/Nodes: There are no enlarged mediastinal, hilar or axillary lymph nodes. Probable previous resection of the left thyroid lobe. The trachea and esophagus demonstrate no significant findings. Lungs/Pleura: There is no pleural effusion. The previously demonstrated small ground-glass opacities within the right lower lobe demonstrate continued improvement, largest measuring 4 mm on image 106/3. Additional scattered small nodules bilaterally are unchanged. There is a small  perifissural nodule along the minor fissure on image 73/3. No new or enlarging nodules. Musculoskeletal/Chest wall: No chest wall mass or suspicious osseous findings. Mild thoracolumbar scoliosis. CT ABDOMEN AND PELVIS FINDINGS Hepatobiliary: The liver is normal in density without suspicious focal abnormality. No evidence of gallstones, gallbladder wall thickening or biliary dilatation. Pancreas: Unremarkable. No pancreatic ductal dilatation or surrounding inflammatory changes. Spleen: Normal in size without focal abnormality. Adrenals/Urinary Tract: Both adrenal glands appear normal. Stable small cyst in the anterior interpolar region of the right kidney. The kidneys otherwise appear normal. No evidence of urinary tract calculus or hydronephrosis. The bladder appears unremarkable for its degree of distention. Stomach/Bowel: No evidence of bowel wall thickening, distention or surrounding inflammatory change. The stomach is incompletely distended with mucosal redundancy and a possible small diverticulum in the posterior fundus. A large amount of stool is present throughout the colon. Rectal anastomosis appears patent. Vascular/Lymphatic: There are no enlarged abdominal or pelvic lymph nodes. Mild aortic and branch vessel atherosclerosis. The portal, superior mesenteric and splenic veins are patent. Reproductive: Hysterectomy. No adnexal mass. Other: Stable postsurgical changes within the anterior abdominal wall. Low-density fluid collection to the left of the rectal anastomosis measures 2.2 cm on image 107/2, previously 1.4 cm. This demonstrates no definite solid components or surrounding inflammation. There is increased peritoneal nodularity superior to the transverse colon and adjacent to the pancreatic tail, most notable on sagittal images 78, 97 and 134 of series 6, suspicious for progressive peritoneal carcinomatosis. Stable mild nodular thickening in the subhepatic space, also best seen on the sagittal images.  No peritoneal implants seen within the pelvis. Musculoskeletal: No acute or significant osseous findings. IMPRESSION: 1. Increased peritoneal nodularity superior to the transverse colon and adjacent to the pancreatic tail suspicious for progressive peritoneal carcinomatosis. Stable nodular thickening in the subhepatic space. 2. Fluid collection to the left of the rectal anastomosis has mildly enlarged, although demonstrates no solid components. This is indeterminate. No generalized ascites. 3. No other evidence of metastatic disease within the chest, abdomen or pelvis. 4. Further improvement in previously demonstrated small ground-glass pulmonary opacities within the right lower lobe, likely inflammatory. Additional small pulmonary nodules are unchanged. 5. Large amount of stool throughout the colon consistent with constipation. 6. Aortic Atherosclerosis (ICD10-I70.0). Electronically Signed   By: WRichardean SaleM.D.   On: 12/02/2019 11:36    No images are attached to the encounter.   CMP Latest Ref Rng & Units 11/17/2019  Glucose 70 - 99 mg/dL 128(H)  BUN 8 - 23 mg/dL 20  Creatinine 0.44 - 1.00 mg/dL 0.64  Sodium 135 - 145 mmol/L 139  Potassium 3.5 -  5.1 mmol/L 4.1  Chloride 98 - 111 mmol/L 103  CO2 22 - 32 mmol/L 27  Calcium 8.9 - 10.3 mg/dL 9.3  Total Protein 6.5 - 8.1 g/dL 7.1  Total Bilirubin 0.3 - 1.2 mg/dL 0.6  Alkaline Phos 38 - 126 U/L 121  AST 15 - 41 U/L 36  ALT 0 - 44 U/L 45(H)   CBC Latest Ref Rng & Units 11/17/2019  WBC 4.0 - 10.5 K/uL 3.1(L)  Hemoglobin 12.0 - 15.0 g/dL 11.3(L)  Hematocrit 36.0 - 46.0 % 33.8(L)  Platelets 150 - 400 K/uL 149(L)     Observation/objective: Appears in no acute distress of a video visit today.  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  She is s/p optimal debulking surgery and 6 cycles of carbotaxol chemotherapy.  Last  chemotherapy given on 11/17/2019.  This is a visit to discuss CT scan results and further management  1.  I have reviewed CT chest abdomen pelvis images independently.  I have shown the images to the patient as well as discussed the findings in detail.Most recent CT scan show increasing peritoneal thickening especially in the region of transverse colon concerning for disease progression.  Also her tumor marker CA-125 has been steadily increasing and was up to 91 on 11/17/2019 as compared to 32.2 on 08/14/2019 after surgery.  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  2.  We will look into getting foundation 1 testing covered by financial assistance if possible  3.  I also discussed her case with Dr. Theora Gianotti.  Outside of clinical trial I would recommend second line chemotherapy with rituximab 50 mg per metered squared every [redacted] weeks along with Avastin every 2 weeks.  This would need to be given until progression or toxicity.  Discussed risks and benefits of Doxil including all but not limited to fatigue, low blood counts, risk of infections and hospitalization as well as cardiotoxicity.  Discussed risks and benefits of a Avastin including all but not limited to hypertension, risk of thromboembolism leg swelling and proteinuria.  Treatment will be given with a palliative intent.  4.  An alternative option would be to see if she would be a candidate for clinical trial and Dr. Theora Gianotti will discuss this more with her during her visit on 12/06/2019.  If patient decides to participate and is eligible for clinical trial she will have to get that at [facility]  Patient and her daughter and understanding of the plan and we will make further decisions after her discussion with Dr. Theora Gianotti this week.  Follow-up instructions: to be determined  I discussed the assessment and treatment plan with the patient. The patient was provided an opportunity to ask questions and all  were answered. The patient agreed with the plan and demonstrated an understanding of the instructions.   The patient was advised to call back or seek an in-person evaluation if the symptoms worsen or if the condition fails to improve as anticipated.   Visit Diagnosis: 1. Goals of care, counseling/discussion   2. Malignant neoplasm of ovary, unspecified laterality ([place])   3. Peritoneal carcinoma ([place])     Dr. Randa Evens, MD, MPH [facility] Tel- [phone] 12/06/2019 9:48 AM

## 2019-12-06 NOTE — Progress Notes (Signed)
Gynecologic Oncology Inteval Visit   Referring Provider: Dr. Alain Marion  Chief Complaint: High grade serous ovarian cancer, advanced stage  Subjective:  Amanda Pena is a 64 y.o. P1 post menopausal female, initially seen in consultation from Dr. Alain Marion for adnexal masses with carcinomatosis, who returns to clinic for evaluation of abdominal pain.   After her optimal debulking surgery she had 3 more cycles of carboplatin paclitaxel chemotherapy for a total of 6 treatments.  Last chemotherapy given on 11/17/2019.  She appears to have progressive disease based on rising CA125 and possible nodularity on imaging.   CT A/P 12/01/2019 Lungs/Pleura: Right lower lobe nodules demonstrate continued improvement, largest measuring 4 mm on image 106/3. Additional scattered small nodules bilaterally are unchanged. There is a small perifissural nodule along the minor fissure on image 73/3.   Stable postsurgical changes within the anterior abdominal wall. Low-density fluid collection to the left of the rectal anastomosis measures 2.2 cm on image 107/2, previously 1.4 cm. This demonstrates no definite solid components or surrounding inflammation. There is increased peritoneal nodularity superior to the transverse colon and adjacent to the pancreatic tail, most notable on sagittal images 78, 97 and 134 of series 6, suspicious for progressive peritoneal carcinomatosis. Stable mild nodular thickening in the subhepatic space, also best seen on the sagittal images.   IMPRESSION: 1. Increased peritoneal nodularity superior to the transverse colon and adjacent to the pancreatic tail suspicious for progressive peritoneal carcinomatosis. Stable nodular thickening in the subhepatic space. 2. Fluid collection to the left of the rectal anastomosis has mildly enlarged, although demonstrates no solid components. This is indeterminate. No generalized ascites.  3. No other evidence of metastatic disease within the chest,  abdomen or pelvis. 4. Further improvement in previously demonstrated small ground-glass pulmonary opacities within the right lower lobe, likely inflammatory. Additional small pulmonary nodules are unchanged. 5. Large amount of stool throughout the colon consistent with constipation. 6. Aortic Atherosclerosis (ICD10-I70.0).  CA125 11/17/19 91.2High   10/27/19 77.0High  CM  08/14/19 32.2  Gynecologic Oncology History:  Amanda Pena is a pleasant P1 post menopausal female, initially seen in consultation from Dr. Alain Marion for adnexal masses with carcinomatosis, diagnosed with advanced high grade serous ovarian cancer. Her history is as follows:  In November 2020 she complained of severe lower quadrant pain with abdominal distention/bloating and saw her PCP who ordered CT scan 11/26, which showed heterogeneous soft tissue masses bilateral adnexa, measuring 4.3 x 3.6 cm on left, 4.6 x 2.7 cm on right. Diffuse omental soft tissue caking and diffuse peritoneal thickening are seen throughout the abdomen and pelvis with mild ascites. Findings consistent with peritoneal carcinomatosis.  Additionally, multiple pulmonary nodules in right lower lobe largest measuring 12 mm.   Menopause age 49.  No HRT. Last pap- 07/26/2017- NILM.  She complained of night sweats, intermittent constipation, and chronic back pain at initial visit. She takes multiple supplements.   06/16/19 - CT showed: heterogeneous soft tissue masses bilateral adnexa, measuring 4.3 x 3.6 cm on left, 4.6 x 2.7 cm on right. Diffuse omental soft tissue caking and diffuse peritoneal thickening are seen throughout the abdomen and pelvis with mild ascites. Findings consistent with peritoneal carcinomatosis. Additionally, multiple pulmonary nodules in right lower lobe largest measuring 12 mm.   [id number] on 06/21/19  06/27/2019 CT Chest to complete staging showed multiple bilateral pulmonary nodules suspicious for metastatic disease larges measuring  1.0 cm in RLL. Soft tissue mass adjacent to GE junction suspicious for metastasis, right pleural effusion, right  basilar atelectasis, ascites, CAD.   Omental mass was biopsied on 06/27/2019 which showed:  HIGH-GRADE SEROUS CARCINOMA OF GYNECOLOGIC ORIGIN.   In view of concern for lung metastases, she initiated carbo-taxol chemotherapy on 06/30/2019  Instead of undergoing primary debulking.  She received 3 cycles. After first cycle she experienced bone pain and bone scan was performed which was negative for osseous metastatic disease. Port was placed for chemotherapy for patient preference on 07/18/2019.   08/29/2019- CT Chest/Abdomen/Pelvis for restaging 1. Interval response to therapy, decrease in peritoneal carcinomatosis with decreased tumor volume within the omentum. Previously noted bilateral enlarged ovaries have decreased in size from previous exam. Thickened appendix identified along th eright pericolic gutter has decreased in caliber in the interval.  2. Interval improvement in previously noted right lower lobe pulmonary nodularity which is favored to represent sequelae of inflammatory or infectious process. A few persistent, stable milli metric lung nodules are nonspecific but likely represent a benign process. Attention on follow-up imaging advised.   CA -125 was elevated at time of diagnosis and has been followed:  06/21/2019 628 07/24/19  145 08/14/19 32.2  CEA was 1.2 on 06/21/2019.   CA125 normalized after three cycles of carboplatin/taxol. She had interval debulking surgery at [facility] 2/23 with TAH,BSO, omentectomy, appendectomy, rectosigmoid resection and reanastamosis, argon beam coagulation of diaphragm with no residual disease left behind.  She had an uneventful postoperative course.  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  09/12/2019 She had interval debulking surgery at [facility] with TAH,BSO, omentectomy, appendectomy, rectosigmoid resection and reanastamosis, argon beam  coagulation of diaphragm with no residual disease left behind.  She had an uneventful postoperative course.  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    CT A/P 10/02/2019 IMPRESSION: 1. Interval hysterectomy and bilateral oophorectomy, omentectomy, and rectosigmoid resection for serous ovarian cancer. Small 14 mm fluid density structure in the left pelvis abutting the sigmoid colonic suture line has no surrounding inflammatory change or peripheral enhancement, and favors postoperative seroma. 2. Resection of previous omental nodularity. No evidence of new or recurrent disease. 3. Previous prominent appendix is not seen, question interval appendectomy. 4. Moderate volume of stool in the left colon and rectum, can be seen with constipation. No bowel obstruction or bowel inflammation. 5. Hepatic steatosis.   Germline genetic testing negative.  HRD negative for somatic BRCA1/2 mutations. GIS negative.   Problem List: Patient Active Problem List   Diagnosis Date Noted  . Genetic testing 11/16/2019  . Family history of breast cancer   . Family history of lung cancer   . Postoperative pain 10/04/2019  . Other constipation 10/04/2019  . Peritoneal carcinoma ([place]) 06/23/2019  . Malignant neoplasm of ovary ([place]) 06/23/2019  . Abdominal pain 06/13/2019  . LLQ abdominal pain 06/13/2019  . Calcific bursitis of shoulder 01/29/2016  . Cough 10/15/2014  . Cervical pain (neck) 10/15/2014  . Situational anxiety 03/07/2014  . Goals of care, counseling/discussion 02/10/2013  . 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 Preventative health care 11/09/2011  . INSOMNIA, CHRONIC 08/21/2010  . PERSONAL HISTORY MALIGNANT NEOPLASM THYROID 08/21/2010  . Hypothyroidism 04/09/2008  . Hyperlipidemia 04/09/2008  . Sinusitis 04/09/2008    Past Medical History: Past Medical History:  Diagnosis Date  . Cancer ([place])   . Family history of breast cancer   . Family history of lung cancer    . HYPERLIPIDEMIA 04/09/2008   Qualifier: Diagnosis of  By: Wynona Luna   . HYPOTHYROIDISM 04/09/2008   Qualifier:  Diagnosis of  By: Wynona Luna   . INSOMNIA, CHRONIC 08/21/2010   Qualifier: Diagnosis of  By: Wynona Luna   . PERSONAL HISTORY MALIGNANT NEOPLASM THYROID 08/21/2010   Qualifier: Diagnosis of  By: Wynona Luna     Past Surgical History: Past Surgical History:  Procedure Laterality Date  . IR IMAGING GUIDED PORT INSERTION  07/18/2019  . THYROIDECTOMY, PARTIAL      Family History: Family History  Problem Relation Age of Onset  . Arthritis Mother   . Hyperlipidemia Mother   . Hyperlipidemia Father   . Diabetes Father   . Hypertension Father   . Heart disease Father 21       CAD  . Lung cancer Father   . Breast cancer Maternal Grandmother        in 27s  . Cancer Maternal Aunt        unk type  . Cancer Maternal Uncle        unk type  . Cancer Paternal Uncle        unk type, d. 38s  . Skin cancer Daughter        basal cell     Social History: Social History   Socioeconomic History  . Marital status: Married    Spouse name: Not on file  . Number of children: Not on file  . Years of education: Not on file  . Highest education level: Not on file  Occupational History  . Not on file  Tobacco Use  . Smoking status: Never Smoker  . Smokeless tobacco: Never Used  Substance and Sexual Activity  . Alcohol use: Not Currently  . Drug use: No  . Sexual activity: Yes  Other Topics Concern  . 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 Difficulty of Paying Living Expenses:   Food Insecurity:   . Worried About Charity fundraiser in the Last Year:   . Arboriculturist in the Last Year:   Transportation Needs:   . Film/video editor  (Medical):   Marland Kitchen Lack of Transportation (Non-Medical):   Physical Activity:   . Days of Exercise per Week:   . Minutes of Exercise per Session:   Stress:   . Feeling of Stress :   Social Connections:   . Frequency of Communication with Friends and Family:   . Frequency of Social Gatherings with Friends and Family:   . Attends Religious Services:   . Active Member of Clubs or Organizations:   . Attends Archivist Meetings:   Marland Kitchen Marital Status:   Intimate Partner Violence:   . Fear of Current or Ex-Partner:   . Emotionally Abused:   Marland Kitchen Physically Abused:   . Sexually Abused:     Allergies: No Known Allergies  Current Medications: Current Outpatient Medications  Medication Sig Dispense Refill  . ALPRAZolam (XANAX) 0.5 MG tablet Take 1 tablet (0.5 mg total) by mouth 2 (two) times daily as needed for anxiety. 60 tablet 0  . Artificial Tear Solution (20/20 ARTIFICIAL TEARS OP) Apply 2 drops to eye daily as needed.     . Ascorbic Acid (VITAMIN C) 1000 MG tablet Take 1,000 mg by mouth in the morning and at bedtime.     Marland Kitchen  Cholecalciferol (VITAMIN D3) 50 MCG (2000 UT) TABS Take 1 tablet by mouth in the morning and at bedtime.    Marland Kitchen HYDROcodone-acetaminophen (NORCO/VICODIN) 5-325 MG tablet Take 1 tablet by mouth every 6 (six) hours as needed for severe pain. (Patient not taking: Reported on 11/17/2019) 45 tablet 0  . lidocaine-prilocaine (EMLA) cream Apply generously to the port area 45-60 mins prior. 30 g 0  . MELATONIN PO Take 5 mg by mouth daily as needed.     . Omega-3 Fatty Acids (FISH OIL) 1000 MG CAPS Take 2 capsules by mouth daily.    . ondansetron (ZOFRAN) 8 MG tablet One pill every 8 hours as needed for nausea/vomitting. 40 tablet 1  . polyethylene glycol (MIRALAX / GLYCOLAX) 17 g packet Take 17 g by mouth daily as needed.     . Probiotic Product (PROBIOTIC-10) CHEW Chew 1 capsule by mouth.    . prochlorperazine (COMPAZINE) 10 MG tablet Take 1 tablet (10 mg total) by mouth  every 6 (six) hours as needed for nausea or vomiting. 40 tablet 1  . promethazine (PHENERGAN) 25 MG tablet Take 1 tablet (25 mg total) by mouth every 6 (six) hours as needed for nausea or vomiting. 30 tablet 0  . sertraline (ZOLOFT) 25 MG tablet Take 1 tablet (25 mg total) by mouth daily. 30 tablet 3  . sertraline (ZOLOFT) 50 MG tablet TAKE 1 TABLET BY MOUTH EVERY DAY 90 tablet 2  . SYNTHROID 75 MCG tablet Take 1 tablet (75 mcg total) by mouth daily before breakfast. 90 tablet 3  . traMADol (ULTRAM) 50 MG tablet Take 1 tablet (50 mg total) by mouth every 6 (six) hours as needed for moderate pain or severe pain. (Patient not taking: Reported on 11/17/2019) 30 tablet 0  . zolpidem (AMBIEN) 10 MG tablet TAKE 1 TABLET BY MOUTH EVERY DAY AT BEDTIME AS NEEDED FOR SLEEP 30 tablet 3   No current facility-administered medications for this visit.   Review of Systems She declined to complete ROS  General: no complaints  HEENT: no complaints  Lungs: no complaints  Cardiac: no complaints  GI: abdominal pain and bloating, alternating constipation and diarrhea  GU: no complaints  Musculoskeletal: no complaints  Extremities: no complaints  Skin: no complaints  Neuro: peripheral neuropathy feet, grade 1 not affecting ADLs; feeling sad  Endocrine: no complaints  Psych: no complaints       Objective:  Physical Examination:  There were no vitals taken for this visit.    ECOG Performance Status: 1 - Symptomatic but completely ambulatory  GENERAL: Patient is a well appearing female in no acute distress HEENT:  Atraumatic and normocephalic NODES:  No cervical, supraclavicular, axillary, or inguinal lymphadenopathy palpated.  LUNGS:  Clear to auscultation bilaterally.  HEART:  Regular rate and rhythm.  ABDOMEN:  Soft, nontender, no obvious ascites, hernia, masses. No hepatosplenomegaly.  MSK:  No focal spinal tenderness to palpation. Full range of motion bilaterally in the upper  extremities. EXTREMITIES:  No peripheral edema.   SKIN:  Clear with no obvious rashes or skin changes. No nail dyscrasia. NEURO:  Nonfocal. Well oriented.  Appropriate affect.  Pelvic: chaperoned by RN EGBUS: no lesions Cervix: absent Vagina: no lesions, no discharge or bleeding, vaginal cuff healing Uterus: absent Adnexa: absent BME: no palpable masses Rectovaginal: confirmatory   Radiologic Imaging:  As noted per interval history. Images personally reviewed CBC    Component Value Date/Time   WBC 3.1 (L) 11/17/2019 0810   RBC 3.56 (L)  11/17/2019 0810   HGB 11.3 (L) 11/17/2019 0810   HCT 33.8 (L) 11/17/2019 0810   PLT 149 (L) 11/17/2019 0810   MCV 94.9 11/17/2019 0810   MCH 31.7 11/17/2019 0810   MCHC 33.4 11/17/2019 0810   RDW 14.0 11/17/2019 0810   LYMPHSABS 1.4 11/17/2019 0810   MONOABS 0.3 11/17/2019 0810   EOSABS 0.0 11/17/2019 0810   BASOSABS 0.0 11/17/2019 0810   CMP Latest Ref Rng & Units 11/17/2019 10/27/2019 10/02/2019  Glucose 70 - 99 mg/dL 128(H) 146(H) 109(H)  BUN 8 - 23 mg/dL 20 17 17   Creatinine 0.44 - 1.00 mg/dL 0.64 0.84 0.66  Sodium 135 - 145 mmol/L 139 138 137  Potassium 3.5 - 5.1 mmol/L 4.1 3.9 3.8  Chloride 98 - 111 mmol/L 103 102 103  CO2 22 - 32 mmol/L 27 26 26   Calcium 8.9 - 10.3 mg/dL 9.3 9.4 9.2  Total Protein 6.5 - 8.1 g/dL 7.1 7.2 7.4  Total Bilirubin 0.3 - 1.2 mg/dL 0.6 0.6 0.5  Alkaline Phos 38 - 126 U/L 121 112 116  AST 15 - 41 U/L 36 38 23  ALT 0 - 44 U/L 45(H) 46(H) 28      Assessment:  Amanda Pena is a 64 y.o. female diagnosed with high grade serous cancer of ovarian/tubal origin.  Thought initially to have lung metastases and treated with three cycles of neoadjuvant chemotherapy. Subsequent  CT suggested the lung findings may not have been lung mets.  CA125 regressed to normal and CT scan 1/21 showed minimal residual disease.  Underwent interval debulking 2/23 and had residual disease that required laparotomy with TAH/BSO,  omentectomy, debulking argon beam coagulation of right diaphragm, appendectomy and rectosigmoid resection and anastamosis.   No residual disease left at the end of surgery. S/p 3 additional cycles of chemotherapy with evidence of rising CA125 and concern for recurrence. No definitive evidence of disease on imaging.  Abdominal symptoms are concerning.   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   Germline MyRisk testing negative for mutations.  Somatic testing with [employer] somatic mutations for  BRCA1/2 negative. HRD - GIS negative.  Medical co-morbidities complicating care: not significant Plan:   Problem List Items Addressed This Visit      Endocrine   Malignant neoplasm of ovary ([place]) - Primary      I recommending obtaining PET scan to assess extent of disease and status of cystic lesion near anastomosis.   She may be a candidate for clinical trial of Doxil and bevacizumab +/- atezolizumab GY009 trial versus Doxil +/- bevacizumab off trial. The PET will help determine if that cystic fluid lesion represents malignancy. If so she may not want to pursue anti-angiogenic therapy if close proximity disease to the anastomosis.   She has an appt next Thursday for chemotherapy that she wants to delay to go on a beach trip. We will let Dr. Janese Banks know.   The patient's diagnosis, an outline of the further diagnostic and laboratory studies which will be required, the recommendation for surgery, and alternatives were discussed with her and her accompanying family members.  All questions were answered to their satisfaction.    Darnise Montag Gaetana Michaelis, MD

## 2019-12-08 ENCOUNTER — Inpatient Hospital Stay: Payer: Self-pay

## 2019-12-08 ENCOUNTER — Inpatient Hospital Stay: Payer: Self-pay | Admitting: Oncology

## 2019-12-10 ENCOUNTER — Other Ambulatory Visit: Payer: Self-pay | Admitting: Oncology

## 2019-12-13 ENCOUNTER — Other Ambulatory Visit: Payer: Self-pay

## 2019-12-13 ENCOUNTER — Encounter
Admission: RE | Admit: 2019-12-13 | Discharge: 2019-12-13 | Disposition: A | Discharge status: Home / Self Care | Payer: Self-pay | Source: Ambulatory Visit | Attending: Obstetrics and Gynecology | Admitting: Obstetrics and Gynecology

## 2019-12-13 DIAGNOSIS — C569 Malignant neoplasm of unspecified ovary: Secondary | ICD-10-CM | POA: Insufficient documentation

## 2019-12-13 LAB — GLUCOSE, CAPILLARY: Glucose-Capillary: 89 mg/dL (ref 70–99)

## 2019-12-13 MED ORDER — FLUDEOXYGLUCOSE F - 18 (FDG) INJECTION
6.5600 | Freq: Once | INTRAVENOUS | Status: AC | PRN
  Administered 2019-12-13: 6.56 via INTRAVENOUS

## 2019-12-14 ENCOUNTER — Inpatient Hospital Stay: Payer: Self-pay | Admitting: Oncology

## 2019-12-14 ENCOUNTER — Inpatient Hospital Stay: Payer: Self-pay

## 2019-12-14 ENCOUNTER — Telehealth: Payer: Self-pay

## 2019-12-14 NOTE — Telephone Encounter (Signed)
PET results reviewed with Dr. Janese Banks. Called results to Ms. Amanda Pena. She is interested in the GY009 study. I have contacted Anderson Malta at [facility] with clinical trials and she is going to arrange an appointment in 12/19/19 with Dr. Theora Gianotti to review the trial further.   IMPRESSION: 1. Multiple hypermetabolic nodular and plaque-like foci of hypermetabolism throughout the peritoneal cavity with associated ill-defined regions of soft tissue attenuation on the CT images, compatible with metabolically active peritoneal carcinomatosis, most prominent in the perihepatic space as detailed. 2. No hypermetabolism associated with the small cystic focus at the left lateral margin of the colorectal anastomosis, considered benign. 3. No hypermetabolic extraperitoneal metastatic disease. 4.  Aortic Atherosclerosis (ICD10-I70.0).

## 2019-12-19 ENCOUNTER — Other Ambulatory Visit: Payer: Self-pay | Admitting: Oncology

## 2019-12-19 DIAGNOSIS — C786 Secondary malignant neoplasm of retroperitoneum and peritoneum: Secondary | ICD-10-CM | POA: Insufficient documentation

## 2019-12-19 NOTE — Progress Notes (Signed)
DISCONTINUE ON PATHWAY REGIMEN - Ovarian     A cycle is every 21 days:     Paclitaxel      Carboplatin   **Always confirm dose/schedule in your pharmacy ordering system**  REASON: Continuation Of Treatment PRIOR TREATMENT: [id number]: Carboplatin AUC=6 + Paclitaxel 175 mg/m2 q21 Days x 2-4 Cycles TREATMENT RESPONSE: Progressive Disease (PD)  START OFF PATHWAY REGIMEN - Ovarian   [id number]:Liposomal Doxorubicin (Doxil) 40 mg/m2  D1 + Bevacizumab 10 mg/kg D1, 15 q28 Days:   A cycle is every 28 days:     Liposomal doxorubicin      Bevacizumab-xxxx   **Always confirm dose/schedule in your pharmacy ordering system**  Patient Characteristics: Preoperative or Nonsurgical Candidate (Clinical Staging), Newly Diagnosed, Surgery followed by Adjuvant Therapy Therapeutic Status: Preoperative or Nonsurgical Candidate (Clinical Staging) BRCA Mutation Status: Awaiting Test Results AJCC T Category: cT3c AJCC 8 Stage Grouping: IVB AJCC N Category: cNX AJCC M Category: pM1b Therapy Plan: Surgery followed by Adjuvant Therapy Intent of Therapy: Non-Curative / Palliative Intent, Discussed with Patient

## 2019-12-20 ENCOUNTER — Telehealth: Payer: Self-pay | Admitting: *Deleted

## 2019-12-20 NOTE — Telephone Encounter (Signed)
I called pt to ask about if she had made decision about chemo study at [facility]. Dr. Theora Gianotti had told me this evening that she signed the consents for study and had ECHO today . So we should cancel the appts for tom. I asked pt how she felt and she said the same thing. If her echo was good she could join the study. She  Really likes dr secord and looking forward to starting. She will let us know if there are issues and need our help in future. I told her that Theora Gianotti and Steffanie Dunn usually keep [place] updates but please call if she needs our services.

## 2019-12-21 ENCOUNTER — Inpatient Hospital Stay: Payer: Self-pay

## 2019-12-21 ENCOUNTER — Inpatient Hospital Stay: Payer: Self-pay | Admitting: Oncology

## 2019-12-27 ENCOUNTER — Ambulatory Visit: Payer: Self-pay

## 2020-01-24 ENCOUNTER — Other Ambulatory Visit: Payer: Self-pay | Admitting: *Deleted

## 2020-01-24 DIAGNOSIS — C569 Malignant neoplasm of unspecified ovary: Secondary | ICD-10-CM

## 2020-01-29 ENCOUNTER — Inpatient Hospital Stay: Payer: Self-pay | Attending: Obstetrics and Gynecology

## 2020-01-29 ENCOUNTER — Other Ambulatory Visit: Payer: Self-pay

## 2020-01-29 DIAGNOSIS — C7800 Secondary malignant neoplasm of unspecified lung: Secondary | ICD-10-CM | POA: Insufficient documentation

## 2020-01-29 DIAGNOSIS — Z9221 Personal history of antineoplastic chemotherapy: Secondary | ICD-10-CM | POA: Insufficient documentation

## 2020-01-29 DIAGNOSIS — Z85850 Personal history of malignant neoplasm of thyroid: Secondary | ICD-10-CM | POA: Insufficient documentation

## 2020-01-29 DIAGNOSIS — Z79899 Other long term (current) drug therapy: Secondary | ICD-10-CM | POA: Insufficient documentation

## 2020-01-29 DIAGNOSIS — Z9079 Acquired absence of other genital organ(s): Secondary | ICD-10-CM | POA: Insufficient documentation

## 2020-01-29 DIAGNOSIS — C569 Malignant neoplasm of unspecified ovary: Secondary | ICD-10-CM

## 2020-01-29 DIAGNOSIS — Z90722 Acquired absence of ovaries, bilateral: Secondary | ICD-10-CM | POA: Insufficient documentation

## 2020-01-29 DIAGNOSIS — C562 Malignant neoplasm of left ovary: Secondary | ICD-10-CM | POA: Insufficient documentation

## 2020-01-29 DIAGNOSIS — Z90710 Acquired absence of both cervix and uterus: Secondary | ICD-10-CM | POA: Insufficient documentation

## 2020-01-29 DIAGNOSIS — C786 Secondary malignant neoplasm of retroperitoneum and peritoneum: Secondary | ICD-10-CM | POA: Insufficient documentation

## 2020-01-29 DIAGNOSIS — C561 Malignant neoplasm of right ovary: Secondary | ICD-10-CM | POA: Insufficient documentation

## 2020-01-29 LAB — CBC WITH DIFFERENTIAL/PLATELET
Abs Immature Granulocytes: 0 10*3/uL (ref 0.00–0.07)
Basophils Absolute: 0 10*3/uL (ref 0.0–0.1)
Basophils Relative: 0 %
Eosinophils Absolute: 0 10*3/uL (ref 0.0–0.5)
Eosinophils Relative: 1 %
HCT: 34.4 % — ABNORMAL LOW (ref 36.0–46.0)
Hemoglobin: 11.7 g/dL — ABNORMAL LOW (ref 12.0–15.0)
Immature Granulocytes: 0 %
Lymphocytes Relative: 38 %
Lymphs Abs: 1.2 10*3/uL (ref 0.7–4.0)
MCH: 32.4 pg (ref 26.0–34.0)
MCHC: 34 g/dL (ref 30.0–36.0)
MCV: 95.3 fL (ref 80.0–100.0)
Monocytes Absolute: 0.4 10*3/uL (ref 0.1–1.0)
Monocytes Relative: 12 %
Neutro Abs: 1.5 10*3/uL — ABNORMAL LOW (ref 1.7–7.7)
Neutrophils Relative %: 49 %
Platelets: 154 10*3/uL (ref 150–400)
RBC: 3.61 MIL/uL — ABNORMAL LOW (ref 3.87–5.11)
RDW: 13.9 % (ref 11.5–15.5)
WBC: 3 10*3/uL — ABNORMAL LOW (ref 4.0–10.5)
nRBC: 0 % (ref 0.0–0.2)

## 2020-01-29 LAB — COMPREHENSIVE METABOLIC PANEL
ALT: 19 U/L (ref 0–44)
AST: 24 U/L (ref 15–41)
Albumin: 4.1 g/dL (ref 3.5–5.0)
Alkaline Phosphatase: 74 U/L (ref 38–126)
Anion gap: 8 (ref 5–15)
BUN: 19 mg/dL (ref 8–23)
CO2: 27 mmol/L (ref 22–32)
Calcium: 9.1 mg/dL (ref 8.9–10.3)
Chloride: 102 mmol/L (ref 98–111)
Creatinine, Ser: 0.81 mg/dL (ref 0.44–1.00)
GFR calc Af Amer: >60 mL/min (ref >60)
GFR calc non Af Amer: >60 mL/min (ref >60)
Glucose, Bld: 136 mg/dL — ABNORMAL HIGH (ref 70–99)
Potassium: 3.9 mmol/L (ref 3.5–5.1)
Sodium: 137 mmol/L (ref 135–145)
Total Bilirubin: 0.6 mg/dL (ref 0.3–1.2)
Total Protein: 7.3 g/dL (ref 6.5–8.1)

## 2020-02-09 ENCOUNTER — Other Ambulatory Visit: Payer: Self-pay | Admitting: Internal Medicine

## 2020-02-09 MED ORDER — SERTRALINE HCL 100 MG PO TABS
100.0000 mg | ORAL_TABLET | Freq: Every day | ORAL | 3 refills | Status: DC
Start: 2020-02-09 — End: 2020-08-14

## 2020-02-23 ENCOUNTER — Telehealth: Payer: Self-pay | Admitting: Nurse Practitioner

## 2020-02-23 NOTE — Telephone Encounter (Signed)
Writer spoke with patient and was informed that patient is in a clinical trial at [facility] and receiving care there. Writer then spoke with Beckey Rutter, NP regarding this and was advised to cancel appt at [facility] as patient is receiving care at [facility] Appts cancelled and advised.

## 2020-02-23 NOTE — Telephone Encounter (Signed)
Spoke to patient re: follow up appt. She is currently on trial at [facility]. All appointments, scans, etc. Scheduled through [facility] at this time. She requests we cancel appt at [facility]. She will call back if/when she needs appointment locally. She thanked me for the care she received at the cancer center and continues to see Dr. Theora Gianotti for her care.

## 2020-03-06 ENCOUNTER — Inpatient Hospital Stay: Payer: Self-pay

## 2020-03-10 ENCOUNTER — Other Ambulatory Visit: Payer: Self-pay | Admitting: Oncology

## 2020-03-10 DIAGNOSIS — F411 Generalized anxiety disorder: Secondary | ICD-10-CM

## 2020-03-11 NOTE — Telephone Encounter (Signed)
Patient is under clinical trial at [facility].  She should be requesting GYN oncology and due to refill her Xanax.  I have not seen her for a while now

## 2020-03-13 ENCOUNTER — Ambulatory Visit: Payer: Self-pay

## 2020-03-14 ENCOUNTER — Other Ambulatory Visit: Payer: Self-pay | Admitting: Internal Medicine

## 2020-03-14 DIAGNOSIS — F411 Generalized anxiety disorder: Secondary | ICD-10-CM

## 2020-03-14 MED ORDER — ALPRAZOLAM 0.5 MG PO TABS: 0.5000 mg | ORAL_TABLET | Freq: Two times a day (BID) | ORAL | 2 refills | Status: AC | PRN

## 2020-04-05 ENCOUNTER — Telehealth: Payer: Self-pay

## 2020-04-05 NOTE — Telephone Encounter (Signed)
Copy of Neulasta coverage from [employer] mailed to Ms. Amanda Pena.

## 2020-05-22 NOTE — Progress Notes (Signed)
The following Assist/Replace Program for Neulasta from [place] has been terminated due to patient transferred care to [facility].  Last DOS:11/17/2019

## 2020-05-22 NOTE — Progress Notes (Signed)
The following Assist/Replace Program for Mvasi from [place] has been terminated due to patient transferred care to [facility].  Last XMD:YJWL on record.

## 2020-05-28 DIAGNOSIS — Z2821 Immunization not carried out because of patient refusal: Secondary | ICD-10-CM | POA: Insufficient documentation

## 2020-05-28 DIAGNOSIS — Z28310 Unvaccinated for covid-19: Secondary | ICD-10-CM | POA: Insufficient documentation

## 2020-06-19 ENCOUNTER — Telehealth: Payer: Self-pay

## 2020-06-19 DIAGNOSIS — C569 Malignant neoplasm of unspecified ovary: Secondary | ICD-10-CM

## 2020-06-19 NOTE — Telephone Encounter (Signed)
Works for me 

## 2020-06-19 NOTE — Telephone Encounter (Signed)
Received message from [place] that she has progression of disease on scan today (06/18/20).  She is starting weekly paclitaxel and bevacizumab here (Cycle 1 Day 1 of Paclitaxel and MVASI 06/19/20). but would like to transition her chemo back to [facility]. She is requesting to see Dr. Grayland Ormond.  Previously seen by Dr. Rogue Bussing, followed by Dr. Janese Banks, followed by clinical trial at [facility]. Referral sent.

## 2020-06-21 ENCOUNTER — Telehealth: Payer: Self-pay

## 2020-06-21 ENCOUNTER — Encounter: Payer: Self-pay | Admitting: Obstetrics and Gynecology

## 2020-06-21 NOTE — Telephone Encounter (Signed)
Spoke with Ms. Amanda Pena. Through coordination with [facility], she will complete cycle 1 at [facility] and will start with cycle 2 at [facility]. Cycle 2 is due to start 12/28. She would like to have her treatments on Wednesdays. Message sent to Dr. Gary Fleet team.

## 2020-06-24 ENCOUNTER — Other Ambulatory Visit: Payer: Self-pay | Admitting: Oncology

## 2020-06-24 DIAGNOSIS — C786 Secondary malignant neoplasm of retroperitoneum and peritoneum: Secondary | ICD-10-CM

## 2020-06-24 DIAGNOSIS — C482 Malignant neoplasm of peritoneum, unspecified: Secondary | ICD-10-CM

## 2020-06-24 DIAGNOSIS — C569 Malignant neoplasm of unspecified ovary: Secondary | ICD-10-CM

## 2020-06-24 NOTE — Progress Notes (Signed)
DISCONTINUE OFF PATHWAY REGIMEN - Ovarian   [id number]:Liposomal Doxorubicin (Doxil) 40 mg/m2  D1 + Bevacizumab 10 mg/kg D1, 15 q28 Days:   A cycle is every 28 days:     Liposomal doxorubicin      Bevacizumab-xxxx   **Always confirm dose/schedule in your pharmacy ordering system**  REASON: Other Reason PRIOR TREATMENT: Off Pathway: Liposomal Doxorubicin (Doxil) 40 mg/m2  D1 + Bevacizumab 10 mg/kg D1, 15 q28 Days TREATMENT RESPONSE: Unable to Evaluate  START OFF PATHWAY REGIMEN - Ovarian   [id number]:Bevacizumab 10 mg/kg IV D1,15 + Paclitaxel 80 mg/m2 IV I2,0,35,59 q28 Days:   A cycle is every 28 days:     Bevacizumab-xxxx      Paclitaxel   **Always confirm dose/schedule in your pharmacy ordering system**  Patient Characteristics: Recurrent or Progressive Disease, Second Line, Platinum Sensitive and ? 6 Months Since Last Therapy, Not a Candidate for Secondary Debulking Surgery BRCA Mutation Status: Awaiting Test Results Therapeutic Status: Recurrent or Progressive Disease Line of Therapy: Second Line  Intent of Therapy: Non-Curative / Palliative Intent, Discussed with Patient

## 2020-06-25 ENCOUNTER — Other Ambulatory Visit: Payer: Self-pay | Admitting: Oncology

## 2020-07-10 NOTE — Progress Notes (Signed)
..  The following Medication: Mvasi is approved for drug replacement program by [place]. The enrollment period is from 11/21/2019 to 11/20/2020.  Reason for Assistance: Self Pay. ID: [id number] First DOS:07/17/2020.

## 2020-07-14 NOTE — Progress Notes (Signed)
[place]  [phone]) [phone] [phone]) [phone]  ID: Amanda Pena OB: 06-Apr-1956  MR#: [id number]  [id number]  Patient Care Team: Cassandria Anger, MD as PCP - General (Internal Medicine) Arvella Nigh, MD as Consulting Physician (Obstetrics and Gynecology) Clent Jacks, RN as Oncology Nurse Navigator  CHIEF COMPLAINT: Recurrent platinum resistant ovarian cancer.  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  She recently initiated third line treatment with Taxol and Avastin and is transferring care to be closer to home.  She has a mild peripheral neuropathy, but otherwise is tolerating her treatments well.  She has no other neurologic complaints.  She denies any recent fevers or illnesses.  She has a good appetite denies weight loss.  She has no chest pain, shortness of breath, cough, or hemoptysis.  She denies any nausea, vomiting, constipation, or diarrhea.  She has no urinary complaints.  Patient offers no further specific complaints today.  REVIEW OF SYSTEMS:   Review of Systems  Constitutional: Negative.  Negative for fever, malaise/fatigue and weight loss.  Respiratory: Negative.  Negative for cough, hemoptysis and shortness of breath.   Cardiovascular: Negative.  Negative for chest pain and leg swelling.  Gastrointestinal: Negative.  Negative for abdominal pain and nausea.  Genitourinary: Negative.  Negative for dysuria.  Musculoskeletal: Negative.  Negative for back pain.  Skin: Negative.  Negative for rash.  Neurological: Positive for sensory change. Negative for dizziness, focal weakness, weakness and headaches.  Psychiatric/Behavioral: The patient is nervous/anxious.     As per HPI. Otherwise, a complete review of systems is  negative.  PAST MEDICAL HISTORY: Past Medical History:  Diagnosis Date  . Cancer ([place])   . Family history of breast cancer   . Family history of lung cancer   . HYPERLIPIDEMIA 04/09/2008   Qualifier: Diagnosis of  By: Wynona Luna   . HYPOTHYROIDISM 04/09/2008   Qualifier: Diagnosis of  By: Wynona Luna   . INSOMNIA, CHRONIC 08/21/2010   Qualifier: Diagnosis of  By: Wynona Luna   . PERSONAL HISTORY MALIGNANT NEOPLASM THYROID 08/21/2010   Qualifier: Diagnosis of  By: Wynona Luna     PAST SURGICAL HISTORY: Past Surgical History:  Procedure Laterality Date  . IR IMAGING GUIDED PORT INSERTION  07/18/2019  . THYROIDECTOMY, PARTIAL      FAMILY HISTORY: Family History  Problem Relation Age of Onset  . Arthritis Mother   . Hyperlipidemia Mother   . Hyperlipidemia Father   . Diabetes Father   . Hypertension Father   . Heart disease Father 53       CAD  . Lung cancer Father   . Breast cancer Maternal Grandmother        in 65s  . Cancer Maternal Aunt        unk type  . Cancer Maternal Uncle        unk type  . Cancer Paternal Uncle        unk type, d. 69s  . Skin cancer Daughter        basal cell     ADVANCED DIRECTIVES (Y/N):  N  HEALTH MAINTENANCE: Social History   Tobacco Use  . Smoking status: Never Smoker  . Smokeless tobacco: Never Used  Vaping Use  . Vaping Use: Never used  Substance Use Topics  .  Alcohol use: Not Currently  . Drug use: No     Colonoscopy:  PAP:  Bone density:  Lipid panel:  No Known Allergies  Current Outpatient Medications  Medication Sig Dispense Refill  . ALPRAZolam (XANAX) 0.5 MG tablet Take 1 tablet (0.5 mg total) by mouth 2 (two) times daily as needed for anxiety. 60 tablet 2  . Artificial Tear Solution (20/20 ARTIFICIAL TEARS OP) Apply 2 drops to eye daily as needed.     . Ascorbic Acid (VITAMIN C) 1000 MG tablet Take 1,000 mg by mouth in the morning and at bedtime.     . Cholecalciferol (VITAMIN D3) 50 MCG  (2000 UT) TABS Take 1 tablet by mouth in the morning and at bedtime.    . lidocaine-prilocaine (EMLA) cream Apply generously to the port area 45-60 mins prior. 30 g 0  . MELATONIN PO Take 5 mg by mouth daily as needed.     . Omega-3 Fatty Acids (FISH OIL) 1000 MG CAPS Take 2 capsules by mouth daily.    . ondansetron (ZOFRAN) 8 MG tablet One pill every 8 hours as needed for nausea/vomitting. 40 tablet 1  . polyethylene glycol (MIRALAX / GLYCOLAX) 17 g packet Take 17 g by mouth daily as needed.     . Probiotic Product (PROBIOTIC-10) CHEW Chew 1 capsule by mouth.    . prochlorperazine (COMPAZINE) 10 MG tablet Take 1 tablet (10 mg total) by mouth every 6 (six) hours as needed for nausea or vomiting. 40 tablet 1  . promethazine (PHENERGAN) 25 MG tablet Take 1 tablet (25 mg total) by mouth every 6 (six) hours as needed for nausea or vomiting. 30 tablet 0  . sertraline (ZOLOFT) 100 MG tablet Take 1 tablet (100 mg total) by mouth daily. 90 tablet 3  . SYNTHROID 75 MCG tablet Take 1 tablet (75 mcg total) by mouth daily before breakfast. 90 tablet 3  . traMADol (ULTRAM) 50 MG tablet Take 1 tablet (50 mg total) by mouth every 6 (six) hours as needed for moderate pain or severe pain. 30 tablet 0  . zolpidem (AMBIEN) 10 MG tablet TAKE 1 TABLET BY MOUTH EVERY DAY AT BEDTIME AS NEEDED FOR SLEEP 30 tablet 1   No current facility-administered medications for this visit.   Facility-Administered Medications Ordered in Other Visits  Medication Dose Route Frequency Provider Last Rate Last Admin  . bevacizumab-awwb (MVASI) 600 mg in sodium chloride 0.9 % 100 mL chemo infusion  10 mg/kg (Order-Specific) Intravenous Once Lloyd Huger, MD 372 mL/hr at 07/17/20 1309 600 mg at 07/17/20 1309    OBJECTIVE: Vitals:   07/17/20 0957  BP: 128/80  Pulse: 88  Temp: 98.7 F (37.1 C)  SpO2: 99%     Body mass index is 19.64 kg/m.    ECOG FS:1 - Symptomatic but completely ambulatory  General: Well-developed,  well-nourished, no acute distress. Eyes: Pink conjunctiva, anicteric sclera. HEENT: Normocephalic, moist mucous membranes. Lungs: No audible wheezing or coughing. Heart: Regular rate and rhythm. Abdomen: Soft, nontender, no obvious distention. Musculoskeletal: No edema, cyanosis, or clubbing. Neuro: Alert, answering all questions appropriately. Cranial nerves grossly intact. Skin: No rashes or petechiae noted. Psych: Normal affect. Lymphatics: No cervical, calvicular, axillary or inguinal LAD.   LAB RESULTS:  Lab Results  Component Value Date   NA 138 07/17/2020   K 4.4 07/17/2020   CL 103 07/17/2020   CO2 27 07/17/2020   GLUCOSE 82 07/17/2020   BUN 17 07/17/2020   CREATININE 0.69 07/17/2020  CALCIUM 9.3 07/17/2020   PROT 7.4 07/17/2020   ALBUMIN 3.8 07/17/2020   AST 25 07/17/2020   ALT 20 07/17/2020   ALKPHOS 91 07/17/2020   BILITOT 0.5 07/17/2020   GFRNONAA >60 07/17/2020   GFRAA >60 01/29/2020    Lab Results  Component Value Date   WBC 4.5 07/17/2020   NEUTROABS 2.9 07/17/2020   HGB 11.2 (L) 07/17/2020   HCT 34.5 (L) 07/17/2020   MCV 93.2 07/17/2020   PLT 161 07/17/2020     STUDIES: No results found.  ASSESSMENT:  Recurrent platinum resistant ovarian cancer.  PLAN:   1.  Recurrent platinum resistant ovarian cancer: Patient's most recent CA-125 at [facility] was reported at 352.6.  CT scan results from June 18, 2020 reviewed independently revealing large volume ascites with peritoneal thickening and enlarging retroperitoneal lymphadenopathy concerning for progressive disease.  She also had indeterminate enlarging of a left supraclavicular lymph node as well as a new right lower lobe pulmonary nodule.  Previously patient has been treated with surgery, carboplatinum and Taxol, liposomal doxorubicin with Avastin plus or minus atezolizumab on trial would GY009.  She received cycle 1 of weekly Taxol on days 1, 8, and 15 along with Avastin on days 1 and 15  at [facility].  Proceed with cycle 2, day 1 of treatment today.  Return to clinic in 1 week for further evaluation and consideration of cycle 2, day 8 which will be Taxol only. 2.  Neuropathy: Patient admits to occasional balance issues and problems sleeping, but does not wish to initiate treatment with gabapentin at this time.  Can consider occupational therapy referral in the near future. 3.  Anemia: Mild, monitor. 4.  Insomnia: Patient was given a refill for Ambien today.  Patient expressed understanding and was in agreement with this plan. She also understands that She can call clinic at any time with any questions, concerns, or complaints.   Cancer Staging Malignant neoplasm of ovary ([place]) Staging form: Ovary, Fallopian Tube, and Primary Peritoneal Carcinoma, AJCC 8th Edition - Clinical: FIGO Stage IVA (pM1a) - Signed by Lloyd Huger, MD on 07/17/2020   Lloyd Huger, MD   07/17/2020 1:14 PM

## 2020-07-17 ENCOUNTER — Encounter: Payer: Self-pay | Admitting: Oncology

## 2020-07-17 ENCOUNTER — Inpatient Hospital Stay: Payer: Self-pay

## 2020-07-17 ENCOUNTER — Inpatient Hospital Stay: Payer: Self-pay | Attending: Oncology | Admitting: Oncology

## 2020-07-17 VITALS — BP 128/80 | HR 88 | Temp 98.7°F | Wt 121.7 lb

## 2020-07-17 DIAGNOSIS — G629 Polyneuropathy, unspecified: Secondary | ICD-10-CM | POA: Insufficient documentation

## 2020-07-17 DIAGNOSIS — Z79899 Other long term (current) drug therapy: Secondary | ICD-10-CM | POA: Insufficient documentation

## 2020-07-17 DIAGNOSIS — Z5111 Encounter for antineoplastic chemotherapy: Secondary | ICD-10-CM | POA: Insufficient documentation

## 2020-07-17 DIAGNOSIS — G4700 Insomnia, unspecified: Secondary | ICD-10-CM | POA: Insufficient documentation

## 2020-07-17 DIAGNOSIS — C569 Malignant neoplasm of unspecified ovary: Secondary | ICD-10-CM

## 2020-07-17 DIAGNOSIS — C786 Secondary malignant neoplasm of retroperitoneum and peritoneum: Secondary | ICD-10-CM

## 2020-07-17 DIAGNOSIS — D649 Anemia, unspecified: Secondary | ICD-10-CM | POA: Insufficient documentation

## 2020-07-17 DIAGNOSIS — C482 Malignant neoplasm of peritoneum, unspecified: Secondary | ICD-10-CM

## 2020-07-17 LAB — CBC WITH DIFFERENTIAL/PLATELET
Abs Immature Granulocytes: 0.01 10*3/uL (ref 0.00–0.07)
Basophils Absolute: 0 10*3/uL (ref 0.0–0.1)
Basophils Relative: 0 %
Eosinophils Absolute: 0.1 10*3/uL (ref 0.0–0.5)
Eosinophils Relative: 3 %
HCT: 34.5 % — ABNORMAL LOW (ref 36.0–46.0)
Hemoglobin: 11.2 g/dL — ABNORMAL LOW (ref 12.0–15.0)
Immature Granulocytes: 0 %
Lymphocytes Relative: 23 %
Lymphs Abs: 1 10*3/uL (ref 0.7–4.0)
MCH: 30.3 pg (ref 26.0–34.0)
MCHC: 32.5 g/dL (ref 30.0–36.0)
MCV: 93.2 fL (ref 80.0–100.0)
Monocytes Absolute: 0.4 10*3/uL (ref 0.1–1.0)
Monocytes Relative: 9 %
Neutro Abs: 2.9 10*3/uL (ref 1.7–7.7)
Neutrophils Relative %: 65 %
Platelets: 161 10*3/uL (ref 150–400)
RBC: 3.7 MIL/uL — ABNORMAL LOW (ref 3.87–5.11)
RDW: 16.7 % — ABNORMAL HIGH (ref 11.5–15.5)
WBC: 4.5 10*3/uL (ref 4.0–10.5)
nRBC: 0 % (ref 0.0–0.2)

## 2020-07-17 LAB — URINALYSIS, DIPSTICK ONLY
Bilirubin Urine: NEGATIVE
Glucose, UA: NEGATIVE mg/dL
Hgb urine dipstick: NEGATIVE
Ketones, ur: NEGATIVE mg/dL
Nitrite: NEGATIVE
Protein, ur: NEGATIVE mg/dL
Specific Gravity, Urine: 1.023 (ref 1.005–1.030)
pH: 5 (ref 5.0–8.0)

## 2020-07-17 LAB — COMPREHENSIVE METABOLIC PANEL
ALT: 20 U/L (ref 0–44)
AST: 25 U/L (ref 15–41)
Albumin: 3.8 g/dL (ref 3.5–5.0)
Alkaline Phosphatase: 91 U/L (ref 38–126)
Anion gap: 8 (ref 5–15)
BUN: 17 mg/dL (ref 8–23)
CO2: 27 mmol/L (ref 22–32)
Calcium: 9.3 mg/dL (ref 8.9–10.3)
Chloride: 103 mmol/L (ref 98–111)
Creatinine, Ser: 0.69 mg/dL (ref 0.44–1.00)
GFR, Estimated: >60 mL/min (ref >60)
Glucose, Bld: 82 mg/dL (ref 70–99)
Potassium: 4.4 mmol/L (ref 3.5–5.1)
Sodium: 138 mmol/L (ref 135–145)
Total Bilirubin: 0.5 mg/dL (ref 0.3–1.2)
Total Protein: 7.4 g/dL (ref 6.5–8.1)

## 2020-07-17 MED ORDER — ZOLPIDEM TARTRATE 10 MG PO TABS: ORAL_TABLET | ORAL | 1 refills | Status: DC

## 2020-07-17 MED ORDER — SODIUM CHLORIDE 0.9 % IV SOLN
10.0000 mg | Freq: Once | INTRAVENOUS | Status: AC
  Administered 2020-07-17: 10 mg via INTRAVENOUS
  Filled 2020-07-17: qty 10

## 2020-07-17 MED ORDER — HEPARIN SOD (PORK) LOCK FLUSH 100 UNIT/ML IV SOLN
INTRAVENOUS | Status: AC
  Filled 2020-07-17: qty 5

## 2020-07-17 MED ORDER — DIPHENHYDRAMINE HCL 50 MG/ML IJ SOLN
25.0000 mg | Freq: Once | INTRAMUSCULAR | Status: AC
  Administered 2020-07-17: 25 mg via INTRAVENOUS
  Filled 2020-07-17: qty 1

## 2020-07-17 MED ORDER — SODIUM CHLORIDE 0.9 % IV SOLN
Freq: Once | INTRAVENOUS | Status: AC
  Filled 2020-07-17: qty 250

## 2020-07-17 MED ORDER — FAMOTIDINE IN NACL 20-0.9 MG/50ML-% IV SOLN
20.0000 mg | Freq: Once | INTRAVENOUS | Status: AC
  Administered 2020-07-17: 20 mg via INTRAVENOUS
  Filled 2020-07-17: qty 50

## 2020-07-17 MED ORDER — HEPARIN SOD (PORK) LOCK FLUSH 100 UNIT/ML IV SOLN
500.0000 [IU] | Freq: Once | INTRAVENOUS | Status: AC | PRN
  Administered 2020-07-17: 500 [IU]
  Filled 2020-07-17: qty 5

## 2020-07-17 MED ORDER — SODIUM CHLORIDE 0.9 % IV SOLN
80.0000 mg/m2 | Freq: Once | INTRAVENOUS | Status: AC
  Administered 2020-07-17: 126 mg via INTRAVENOUS
  Filled 2020-07-17: qty 21

## 2020-07-17 MED ORDER — SODIUM CHLORIDE 0.9 % IV SOLN
10.0000 mg/kg | Freq: Once | INTRAVENOUS | Status: AC
  Administered 2020-07-17: 600 mg via INTRAVENOUS
  Filled 2020-07-17: qty 16

## 2020-07-17 NOTE — Progress Notes (Signed)
Patient here today for follow up regarding ovarian cancer. Patient reports worsening neuropathy in feet. Patient also reports nosebleeds daily in the morning.

## 2020-07-18 LAB — CA 125: Cancer Antigen (CA) 125: 239 U/mL — ABNORMAL HIGH (ref 0.0–38.1)

## 2020-07-19 ENCOUNTER — Other Ambulatory Visit: Payer: Self-pay | Admitting: Internal Medicine

## 2020-07-21 NOTE — Progress Notes (Signed)
[place]  Telephone:(336) [phone] [phone]) [phone]  ID: Amanda Pena OB: January 27, 1956  MR#: [id number]  [id number]  Patient Care Team: Cassandria Anger, MD as PCP - General (Internal Medicine) Arvella Nigh, MD as Consulting Physician (Obstetrics and Gynecology) Clent Jacks, RN as Oncology Nurse Navigator  CHIEF COMPLAINT: Recurrent platinum resistant ovarian cancer.  INTERVAL HISTORY: Patient returns to clinic today for further evaluation and consideration of cycle 2, day 8 of Taxol and Avastin.  Taxol only today.  She is tolerating her treatments well without significant side effects.  The neuropathy in her hands is worse and she is willing to initiate gabapentin.  She has no other neurologic complaints.  She denies any recent fevers or illnesses.  She has a good appetite denies weight loss.  She has no chest pain, shortness of breath, cough, or hemoptysis.  She denies any nausea, vomiting, constipation, or diarrhea.  She has no urinary complaints.  Patient offers no further specific complaints today.  REVIEW OF SYSTEMS:   Review of Systems  Constitutional: Negative.  Negative for fever, malaise/fatigue and weight loss.  Respiratory: Negative.  Negative for cough, hemoptysis and shortness of breath.   Cardiovascular: Negative.  Negative for chest pain and leg swelling.  Gastrointestinal: Negative.  Negative for abdominal pain and nausea.  Genitourinary: Negative.  Negative for dysuria.  Musculoskeletal: Negative.  Negative for back pain.  Skin: Negative.  Negative for rash.  Neurological: Positive for sensory change. Negative for dizziness, focal weakness, weakness and headaches.  Psychiatric/Behavioral: The patient is nervous/anxious.     As per HPI. Otherwise, a complete review of systems is negative.  PAST MEDICAL HISTORY: Past Medical History:  Diagnosis Date  . Cancer ([place])   . Family history of breast cancer   . Family history of  lung cancer   . HYPERLIPIDEMIA 04/09/2008   Qualifier: Diagnosis of  By: Wynona Luna   . HYPOTHYROIDISM 04/09/2008   Qualifier: Diagnosis of  By: Wynona Luna   . INSOMNIA, CHRONIC 08/21/2010   Qualifier: Diagnosis of  By: Wynona Luna   . PERSONAL HISTORY MALIGNANT NEOPLASM THYROID 08/21/2010   Qualifier: Diagnosis of  By: Wynona Luna     PAST SURGICAL HISTORY: Past Surgical History:  Procedure Laterality Date  . IR IMAGING GUIDED PORT INSERTION  07/18/2019  . THYROIDECTOMY, PARTIAL      FAMILY HISTORY: Family History  Problem Relation Age of Onset  . Arthritis Mother   . Hyperlipidemia Mother   . Hyperlipidemia Father   . Diabetes Father   . Hypertension Father   . Heart disease Father 61       CAD  . Lung cancer Father   . Breast cancer Maternal Grandmother        in 54s  . Cancer Maternal Aunt        unk type  . Cancer Maternal Uncle        unk type  . Cancer Paternal Uncle        unk type, d. 49s  . Skin cancer Daughter        basal cell     ADVANCED DIRECTIVES (Y/N):  N  HEALTH MAINTENANCE: Social History   Tobacco Use  . Smoking status: Never Smoker  . Smokeless tobacco: Never Used  Vaping Use  . Vaping Use: Never used  Substance Use Topics  . Alcohol use: Not Currently  . Drug use: No     Colonoscopy:  PAP:  Bone density:  Lipid panel:  No Known Allergies  Current Outpatient Medications  Medication Sig Dispense Refill  . ALPRAZolam (XANAX) 0.5 MG tablet Take 1 tablet (0.5 mg total) by mouth 2 (two) times daily as needed for anxiety. 60 tablet 2  . Artificial Tear Solution (20/20 ARTIFICIAL TEARS OP) Apply 2 drops to eye daily as needed.     . Ascorbic Acid (VITAMIN C) 1000 MG tablet Take 1,000 mg by mouth in the morning and at bedtime.     . Cholecalciferol (VITAMIN D3) 50 MCG (2000 UT) TABS Take 1 tablet by mouth in the morning and at bedtime.    . gabapentin (NEURONTIN) 300 MG capsule Take 1 capsule (300 mg total) by mouth  at bedtime. 30 capsule 1  . lidocaine-prilocaine (EMLA) cream Apply generously to the port area 45-60 mins prior. 30 g 0  . MELATONIN PO Take 5 mg by mouth daily as needed.     . Omega-3 Fatty Acids (FISH OIL) 1000 MG CAPS Take 2 capsules by mouth daily.    . ondansetron (ZOFRAN) 8 MG tablet One pill every 8 hours as needed for nausea/vomitting. 40 tablet 1  . polyethylene glycol (MIRALAX / GLYCOLAX) 17 g packet Take 17 g by mouth daily as needed.     . Probiotic Product (PROBIOTIC-10) CHEW Chew 1 capsule by mouth.    . prochlorperazine (COMPAZINE) 10 MG tablet Take 1 tablet (10 mg total) by mouth every 6 (six) hours as needed for nausea or vomiting. 40 tablet 1  . promethazine (PHENERGAN) 25 MG tablet Take 1 tablet (25 mg total) by mouth every 6 (six) hours as needed for nausea or vomiting. 30 tablet 0  . sertraline (ZOLOFT) 100 MG tablet Take 1 tablet (100 mg total) by mouth daily. 90 tablet 3  . SYNTHROID 75 MCG tablet TAKE 1 TABLET (75 MCG TOTAL) BY MOUTH DAILY BEFORE BREAKFAST. 90 tablet 3  . traMADol (ULTRAM) 50 MG tablet Take 1 tablet (50 mg total) by mouth every 6 (six) hours as needed for moderate pain or severe pain. 30 tablet 0  . zolpidem (AMBIEN) 10 MG tablet TAKE 1 TABLET BY MOUTH EVERY DAY AT BEDTIME AS NEEDED FOR SLEEP 30 tablet 1   No current facility-administered medications for this visit.    OBJECTIVE: Vitals:   07/24/20 0942  BP: 117/81  Pulse: 85  Temp: 99.1 F (37.3 C)  SpO2: 100%     Body mass index is 19.45 kg/m.    ECOG FS:1 - Symptomatic but completely ambulatory  General: Well-developed, well-nourished, no acute distress. Eyes: Pink conjunctiva, anicteric sclera. HEENT: Normocephalic, moist mucous membranes. Lungs: No audible wheezing or coughing. Heart: Regular rate and rhythm. Abdomen: Soft, nontender, no obvious distention. Musculoskeletal: No edema, cyanosis, or clubbing. Neuro: Alert, answering all questions appropriately. Cranial nerves grossly  intact. Skin: No rashes or petechiae noted. Psych: Normal affect.   LAB RESULTS:  Lab Results  Component Value Date   NA 135 07/24/2020   K 4.2 07/24/2020   CL 100 07/24/2020   CO2 26 07/24/2020   GLUCOSE 116 (H) 07/24/2020   BUN 19 07/24/2020   CREATININE 0.88 07/24/2020   CALCIUM 9.1 07/24/2020   PROT 7.0 07/24/2020   ALBUMIN 3.8 07/24/2020   AST 26 07/24/2020   ALT 19 07/24/2020   ALKPHOS 86 07/24/2020   BILITOT 0.6 07/24/2020   GFRNONAA >60 07/24/2020   GFRAA >60 01/29/2020    Lab Results  Component Value Date  WBC 4.8 07/24/2020   NEUTROABS 3.3 07/24/2020   HGB 10.8 (L) 07/24/2020   HCT 33.4 (L) 07/24/2020   MCV 92.0 07/24/2020   PLT 164 07/24/2020     STUDIES: No results found.  ASSESSMENT:  Recurrent platinum resistant ovarian cancer.  PLAN:   1.  Recurrent platinum resistant ovarian cancer: Patient's CA-125 at [facility] prior to initiating treatment was 352.6.  Her most recent result was reported up to 39.0. CT scan results from June 18, 2020 reviewed independently revealing large volume ascites with peritoneal thickening and enlarging retroperitoneal lymphadenopathy concerning for progressive disease.  She also had indeterminate enlarging of a left supraclavicular lymph node as well as a new right lower lobe pulmonary nodule.  Previously patient has been treated with surgery, carboplatinum and Taxol, liposomal doxorubicin with Avastin plus or minus atezolizumab on clinical trial GY009.  She received cycle 1 of weekly Taxol on days 1, 8, and 15 along with Avastin on days 1 and 15 at [facility].  Proceed with cycle 2, day 8 of treatment today.  Taxol only.  Return to clinic in 1 week for further evaluation and consideration of cycle 2, day 15. 2.  Neuropathy: Patient admits to occasional balance issues and problems sleeping.  She has agreed to initiate 300 mg Avastin at night and will increase dose to 300 mg twice daily if tolerated. Can consider  occupational therapy referral in the near future. 3.  Anemia: Chronic and unchanged.  Patient's hemoglobin was 10.8. 4.  Insomnia: Continue Ambien.  Gabapentin as above.  Patient expressed understanding and was in agreement with this plan. She also understands that She can call clinic at any time with any questions, concerns, or complaints.   Cancer Staging Malignant neoplasm of ovary ([place]) Staging form: Ovary, Fallopian Tube, and Primary Peritoneal Carcinoma, AJCC 8th Edition - Clinical: FIGO Stage IVA (pM1a) - Signed by Lloyd Huger, MD on 07/17/2020   Lloyd Huger, MD   07/25/2020 6:27 AM

## 2020-07-24 ENCOUNTER — Inpatient Hospital Stay: Payer: Self-pay | Attending: Oncology

## 2020-07-24 ENCOUNTER — Inpatient Hospital Stay (HOSPITAL_BASED_OUTPATIENT_CLINIC_OR_DEPARTMENT_OTHER): Payer: Self-pay | Admitting: Oncology

## 2020-07-24 ENCOUNTER — Inpatient Hospital Stay (HOSPITAL_BASED_OUTPATIENT_CLINIC_OR_DEPARTMENT_OTHER): Payer: Self-pay | Admitting: Hospice and Palliative Medicine

## 2020-07-24 ENCOUNTER — Inpatient Hospital Stay: Payer: Self-pay

## 2020-07-24 ENCOUNTER — Encounter: Payer: Self-pay | Admitting: Oncology

## 2020-07-24 VITALS — BP 133/72 | HR 75 | Resp 16

## 2020-07-24 VITALS — BP 117/81 | HR 85 | Temp 99.1°F | Wt 120.5 lb

## 2020-07-24 DIAGNOSIS — Z85850 Personal history of malignant neoplasm of thyroid: Secondary | ICD-10-CM | POA: Insufficient documentation

## 2020-07-24 DIAGNOSIS — D649 Anemia, unspecified: Secondary | ICD-10-CM | POA: Insufficient documentation

## 2020-07-24 DIAGNOSIS — G4700 Insomnia, unspecified: Secondary | ICD-10-CM | POA: Insufficient documentation

## 2020-07-24 DIAGNOSIS — Z79899 Other long term (current) drug therapy: Secondary | ICD-10-CM | POA: Insufficient documentation

## 2020-07-24 DIAGNOSIS — E039 Hypothyroidism, unspecified: Secondary | ICD-10-CM | POA: Insufficient documentation

## 2020-07-24 DIAGNOSIS — Z5111 Encounter for antineoplastic chemotherapy: Secondary | ICD-10-CM | POA: Insufficient documentation

## 2020-07-24 DIAGNOSIS — Z90710 Acquired absence of both cervix and uterus: Secondary | ICD-10-CM | POA: Insufficient documentation

## 2020-07-24 DIAGNOSIS — C786 Secondary malignant neoplasm of retroperitoneum and peritoneum: Secondary | ICD-10-CM

## 2020-07-24 DIAGNOSIS — C482 Malignant neoplasm of peritoneum, unspecified: Secondary | ICD-10-CM

## 2020-07-24 DIAGNOSIS — C569 Malignant neoplasm of unspecified ovary: Secondary | ICD-10-CM | POA: Insufficient documentation

## 2020-07-24 DIAGNOSIS — Z515 Encounter for palliative care: Secondary | ICD-10-CM

## 2020-07-24 DIAGNOSIS — G620 Drug-induced polyneuropathy: Secondary | ICD-10-CM | POA: Insufficient documentation

## 2020-07-24 DIAGNOSIS — E785 Hyperlipidemia, unspecified: Secondary | ICD-10-CM | POA: Insufficient documentation

## 2020-07-24 DIAGNOSIS — Z90722 Acquired absence of ovaries, bilateral: Secondary | ICD-10-CM | POA: Insufficient documentation

## 2020-07-24 LAB — CBC WITH DIFFERENTIAL/PLATELET
Abs Immature Granulocytes: 0.04 10*3/uL (ref 0.00–0.07)
Basophils Absolute: 0 10*3/uL (ref 0.0–0.1)
Basophils Relative: 1 %
Eosinophils Absolute: 0.3 10*3/uL (ref 0.0–0.5)
Eosinophils Relative: 6 %
HCT: 33.4 % — ABNORMAL LOW (ref 36.0–46.0)
Hemoglobin: 10.8 g/dL — ABNORMAL LOW (ref 12.0–15.0)
Immature Granulocytes: 1 %
Lymphocytes Relative: 20 %
Lymphs Abs: 1 10*3/uL (ref 0.7–4.0)
MCH: 29.8 pg (ref 26.0–34.0)
MCHC: 32.3 g/dL (ref 30.0–36.0)
MCV: 92 fL (ref 80.0–100.0)
Monocytes Absolute: 0.3 10*3/uL (ref 0.1–1.0)
Monocytes Relative: 6 %
Neutro Abs: 3.3 10*3/uL (ref 1.7–7.7)
Neutrophils Relative %: 66 %
Platelets: 164 10*3/uL (ref 150–400)
RBC: 3.63 MIL/uL — ABNORMAL LOW (ref 3.87–5.11)
RDW: 16.5 % — ABNORMAL HIGH (ref 11.5–15.5)
WBC: 4.8 10*3/uL (ref 4.0–10.5)
nRBC: 0 % (ref 0.0–0.2)

## 2020-07-24 LAB — COMPREHENSIVE METABOLIC PANEL
ALT: 19 U/L (ref 0–44)
AST: 26 U/L (ref 15–41)
Albumin: 3.8 g/dL (ref 3.5–5.0)
Alkaline Phosphatase: 86 U/L (ref 38–126)
Anion gap: 9 (ref 5–15)
BUN: 19 mg/dL (ref 8–23)
CO2: 26 mmol/L (ref 22–32)
Calcium: 9.1 mg/dL (ref 8.9–10.3)
Chloride: 100 mmol/L (ref 98–111)
Creatinine, Ser: 0.88 mg/dL (ref 0.44–1.00)
GFR, Estimated: >60 mL/min (ref >60)
Glucose, Bld: 116 mg/dL — ABNORMAL HIGH (ref 70–99)
Potassium: 4.2 mmol/L (ref 3.5–5.1)
Sodium: 135 mmol/L (ref 135–145)
Total Bilirubin: 0.6 mg/dL (ref 0.3–1.2)
Total Protein: 7 g/dL (ref 6.5–8.1)

## 2020-07-24 LAB — URINALYSIS, DIPSTICK ONLY
Bilirubin Urine: NEGATIVE
Glucose, UA: NEGATIVE mg/dL
Hgb urine dipstick: NEGATIVE
Ketones, ur: NEGATIVE mg/dL
Leukocytes,Ua: NEGATIVE
Nitrite: NEGATIVE
Protein, ur: NEGATIVE mg/dL
Specific Gravity, Urine: 1.024 (ref 1.005–1.030)
pH: 5 (ref 5.0–8.0)

## 2020-07-24 MED ORDER — SODIUM CHLORIDE 0.9% FLUSH
10.0000 mL | INTRAVENOUS | Status: DC | PRN
  Administered 2020-07-24: 10 mL via INTRAVENOUS
  Filled 2020-07-24: qty 10

## 2020-07-24 MED ORDER — SODIUM CHLORIDE 0.9 % IV SOLN
Freq: Once | INTRAVENOUS | Status: AC
  Filled 2020-07-24: qty 250

## 2020-07-24 MED ORDER — FAMOTIDINE IN NACL 20-0.9 MG/50ML-% IV SOLN
20.0000 mg | Freq: Once | INTRAVENOUS | Status: AC
  Administered 2020-07-24: 20 mg via INTRAVENOUS
  Filled 2020-07-24: qty 50

## 2020-07-24 MED ORDER — HEPARIN SOD (PORK) LOCK FLUSH 100 UNIT/ML IV SOLN
500.0000 [IU] | Freq: Once | INTRAVENOUS | Status: AC
  Administered 2020-07-24: 500 [IU] via INTRAVENOUS
  Filled 2020-07-24: qty 5

## 2020-07-24 MED ORDER — SODIUM CHLORIDE 0.9 % IV SOLN
80.0000 mg/m2 | Freq: Once | INTRAVENOUS | Status: AC
  Administered 2020-07-24: 126 mg via INTRAVENOUS
  Filled 2020-07-24: qty 21

## 2020-07-24 MED ORDER — DIPHENHYDRAMINE HCL 50 MG/ML IJ SOLN
25.0000 mg | Freq: Once | INTRAMUSCULAR | Status: AC
  Administered 2020-07-24: 25 mg via INTRAVENOUS
  Filled 2020-07-24: qty 1

## 2020-07-24 MED ORDER — SODIUM CHLORIDE 0.9 % IV SOLN
10.0000 mg | Freq: Once | INTRAVENOUS | Status: AC
  Administered 2020-07-24: 10 mg via INTRAVENOUS
  Filled 2020-07-24: qty 1

## 2020-07-24 MED ORDER — GABAPENTIN 300 MG PO CAPS
300.0000 mg | ORAL_CAPSULE | Freq: Every day | ORAL | 1 refills | Status: DC
Start: 2020-07-24 — End: 2020-08-28

## 2020-07-24 MED ORDER — HEPARIN SOD (PORK) LOCK FLUSH 100 UNIT/ML IV SOLN
INTRAVENOUS | Status: AC
  Filled 2020-07-24: qty 5

## 2020-07-24 NOTE — Progress Notes (Signed)
[place]  [phone] [phone]   Name: Amanda Pena Date: 07/24/2020 MRN: [id number]  DOB: Mar 13, 1956  Patient Care Team: Cassandria Anger, MD as PCP - General (Internal Medicine) Arvella Nigh, MD as Consulting Physician (Obstetrics and Gynecology) Clent Jacks, RN as Oncology Nurse Navigator    REASON FOR CONSULTATION: Amanda Pena is a 65 y.o. 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  She is currently on third line Taxol/Avastin.  Patient was referred to palliative care to help address goals and manage ongoing symptoms.  SOCIAL HISTORY:     reports that she has never smoked. She has never used smokeless tobacco. She reports previous alcohol use. She reports that she does not use drugs.  Patient is married and lives at home with her husband.  She has a daughter.  Patient is active and still plays tennis socially.  ADVANCE DIRECTIVES:  Not on file  CODE STATUS:   PAST MEDICAL HISTORY: Past Medical History:  Diagnosis Date  . Cancer ([place])   . Family history of breast cancer   . Family history of lung cancer   . HYPERLIPIDEMIA 04/09/2008   Qualifier: Diagnosis of  By: Wynona Luna   . HYPOTHYROIDISM 04/09/2008   Qualifier: Diagnosis of  By: Wynona Luna   . INSOMNIA, CHRONIC 08/21/2010   Qualifier: Diagnosis of  By: Wynona Luna   . PERSONAL HISTORY MALIGNANT NEOPLASM THYROID 08/21/2010   Qualifier: Diagnosis of  By: Wynona Luna     PAST SURGICAL HISTORY:  Past Surgical History:  Procedure Laterality Date  . IR IMAGING GUIDED PORT INSERTION  07/18/2019  . THYROIDECTOMY, PARTIAL      HEMATOLOGY/ONCOLOGY HISTORY:  Oncology History Overview Note  #November 2020-omental caking/peritoneal carcinomatosis;  bilateral adnexal masses 4-5 cm in size; multiple lung nodules;   # dec 8th 2020-omental biopsy; positive for high-grade serous adenocarcinoma; GYN origin.  CT scan chest-bilateral lung nodules; 3.8 cm soft tissue mass adjacent to GE junction  # DEC 11TH 2020-CARBO-Taxol [chemo consent] x 3 cycles; February 2021-improvement of the peritoneal carcinomatosis; lung lesions.. 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  Partial colectomy  # #Genetic counseling-s/p NEG myRiskMyriad.   # Thyroid cancer at 26y s/p thyroidectomy [no RAIU]  # NGS/MOLECULAR TESTS: My risk myriad negative; HRD negative; NGS-P  # PALLIATIVE CARE EVALUATION:P  # PAIN MANAGEMENT: P   DIAGNOSIS: Bilateral ovarian cancer/tube/peritoneal  STAGE: IV   ;  GOALS: Control  CURRENT/MOST RECENT THERAPY : Carbotaxol [C]    Peritoneal carcinoma ([place])  06/23/2019 Initial Diagnosis   Peritoneal carcinoma ([place])   06/30/2019 - 11/17/2019 Chemotherapy   The patient had dexamethasone (DECADRON) 4 MG tablet, 8 mg, Oral, Daily, 1 of 1 cycle, Start date: --, End date: -- palonosetron (ALOXI) injection 0.25 mg, 0.25 mg, Intravenous,  Once, 6 of 6 cycles Administration: 0.25 mg (06/30/2019), 0.25 mg (07/24/2019), 0.25 mg (08/14/2019), 0.25 mg (10/06/2019), 0.25 mg (10/27/2019), 0.25 mg (11/17/2019) pegfilgrastim (NEULASTA ONPRO KIT) injection 6 mg, 6 mg, Subcutaneous, Once, 1 of 1 cycle Administration: 6 mg (11/17/2019) CARBOplatin (PARAPLATIN) 570 mg in sodium chloride 0.9 % 250 mL chemo infusion, 570 mg (100 % of original dose 567 mg), Intravenous,  Once, 6 of 6 cycles Dose modification:   (original dose  567 mg, Cycle 1) Administration: 570 mg (06/30/2019), 570 mg (07/24/2019), 570 mg (08/14/2019), 520 mg (10/27/2019), 520 mg (11/17/2019) fosaprepitant (EMEND) 150 mg in sodium chloride 0.9 % 145 mL IVPB, 150 mg, Intravenous,  Once, 1 of 1  cycle PACLitaxel (TAXOL) 288 mg in sodium chloride 0.9 % 250 mL chemo infusion (> $RemoveBef'80mg'KmNpDRLEKT$ /m2), 175 mg/m2 = 288 mg, Intravenous,  Once, 6 of 6 cycles Administration: 288 mg (06/30/2019), 288 mg (07/24/2019), 288 mg (08/14/2019), 288 mg (10/06/2019), 288 mg (10/27/2019), 288 mg (11/17/2019) fosaprepitant (EMEND) 150 mg, dexamethasone (DECADRON) 12 mg in sodium chloride 0.9 % 145 mL IVPB, , Intravenous,  Once, 5 of 5 cycles Administration:  (07/24/2019),  (08/14/2019),  (10/06/2019),  (10/27/2019),  (11/17/2019)  for chemotherapy treatment.     Genetic Testing   Negative genetic testing. No pathogenic variants identified on the Myriad MyRisk+HRD. The report date is 10/02/2019.  The [facility] gene panel offered by [employer] includes sequencing and deletion/duplication testing of the following 35 genes: APC, ATM, AXIN2, BARD1, BMPR1A, BRCA1, BRCA2, BRIP1, CHD1, CDK4, CDKN2A, CHEK2, EPCAM (large rearrangement only), HOXB13, GALNT12, MLH1, MSH2, MSH3, MSH6, MUTYH, NBN, NTHL1, PALB2, PMS2, PTEN, RAD51C, RAD51D, RNF43, RPS20, SMAD4, STK11, and TP53. Sequencing was performed for select regions of POLE and POLD1, and large rearrangement analysis was performed for select regions of GREM1.    12/19/2019 - 12/19/2019 Chemotherapy   The patient had DOXOrubicin HCL LIPOSOMAL (DOXIL) 64 mg in dextrose 5 % 250 mL chemo infusion, 40 mg/m2, Intravenous,  Once, 0 of 6 cycles bevacizumab-bvzr (ZIRABEV) 500 mg in sodium chloride 0.9 % 100 mL chemo infusion, 10 mg/kg, Intravenous,  Once, 0 of 6 cycles  for chemotherapy treatment.    07/17/2020 -  Chemotherapy   The patient had PACLitaxel (TAXOL) 126 mg in sodium chloride 0.9 % 250 mL chemo infusion (</= $RemoveBefor'80mg'FNJGbRqwxNCl$ /m2), 80 mg/m2 = 126 mg, Intravenous,  Once, 1 of 4 cycles Administration: 126 mg (07/17/2020) bevacizumab-awwb (MVASI) 600 mg in sodium chloride 0.9 % 100 mL chemo infusion, 10 mg/kg = 600 mg (100 % of original dose 10 mg/kg), Intravenous,  Once, 1 of 4 cycles Dose  modification: 10 mg/kg (original dose 10 mg/kg, Cycle 1, Reason: Other (see comments), Comment: For replacement drug) Administration: 600 mg (07/17/2020)  for chemotherapy treatment.    Malignant neoplasm of ovary ([place])  06/23/2019 Initial Diagnosis   Malignant neoplasm of ovary ([place])   12/19/2019 - 12/19/2019 Chemotherapy   The patient had DOXOrubicin HCL LIPOSOMAL (DOXIL) 64 mg in dextrose 5 % 250 mL chemo infusion, 40 mg/m2, Intravenous,  Once, 0 of 6 cycles bevacizumab-bvzr (ZIRABEV) 500 mg in sodium chloride 0.9 % 100 mL chemo infusion, 10 mg/kg, Intravenous,  Once, 0 of 6 cycles  for chemotherapy treatment.    07/17/2020 -  Chemotherapy   The patient had PACLitaxel (TAXOL) 126 mg in sodium chloride 0.9 % 250 mL chemo infusion (</= $RemoveBefor'80mg'dkeDHfSjWKlZ$ /m2), 80 mg/m2 = 126 mg, Intravenous,  Once, 1 of 4 cycles Administration: 126 mg (07/17/2020) bevacizumab-awwb (MVASI) 600 mg in sodium chloride 0.9 % 100 mL chemo infusion, 10 mg/kg = 600 mg (100 % of original dose 10 mg/kg), Intravenous,  Once, 1 of 4 cycles Dose modification: 10 mg/kg (original dose 10 mg/kg, Cycle 1, Reason: Other (see comments), Comment: For replacement drug) Administration: 600 mg (07/17/2020)  for chemotherapy treatment.    07/17/2020 Cancer Staging   Staging form: Ovary, Fallopian Tube, and Primary Peritoneal Carcinoma, AJCC 8th Edition - Clinical: FIGO Stage IVA (pM1a) - Signed by  Lloyd Huger, MD on 07/17/2020   Peritoneal carcinomatosis ([place])  12/19/2019 - 12/19/2019 Chemotherapy   The patient had DOXOrubicin HCL LIPOSOMAL (DOXIL) 64 mg in dextrose 5 % 250 mL chemo infusion, 40 mg/m2, Intravenous,  Once, 0 of 6 cycles bevacizumab-bvzr (ZIRABEV) 500 mg in sodium chloride 0.9 % 100 mL chemo infusion, 10 mg/kg, Intravenous,  Once, 0 of 6 cycles  for chemotherapy treatment.    12/19/2019 Initial Diagnosis   Peritoneal carcinomatosis ([place])   07/17/2020 -  Chemotherapy   The patient had PACLitaxel (TAXOL) 126 mg in sodium  chloride 0.9 % 250 mL chemo infusion (</= $RemoveBefor'80mg'RDtfdfgDAbix$ /m2), 80 mg/m2 = 126 mg, Intravenous,  Once, 0 of 4 cycles bevacizumab-bvzr (ZIRABEV) 500 mg in sodium chloride 0.9 % 100 mL chemo infusion, 10 mg/kg = 500 mg, Intravenous,  Once, 0 of 4 cycles  for chemotherapy treatment.    07/17/2020 -  Chemotherapy   The patient had PACLitaxel (TAXOL) 126 mg in sodium chloride 0.9 % 250 mL chemo infusion (</= $RemoveBefor'80mg'gNLJzveBbeza$ /m2), 80 mg/m2 = 126 mg, Intravenous,  Once, 1 of 4 cycles Administration: 126 mg (07/17/2020) bevacizumab-awwb (MVASI) 600 mg in sodium chloride 0.9 % 100 mL chemo infusion, 10 mg/kg = 600 mg (100 % of original dose 10 mg/kg), Intravenous,  Once, 1 of 4 cycles Dose modification: 10 mg/kg (original dose 10 mg/kg, Cycle 1, Reason: Other (see comments), Comment: For replacement drug) Administration: 600 mg (07/17/2020)  for chemotherapy treatment.      ALLERGIES:  has No Known Allergies.  MEDICATIONS:  Current Outpatient Medications  Medication Sig Dispense Refill  . ALPRAZolam (XANAX) 0.5 MG tablet Take 1 tablet (0.5 mg total) by mouth 2 (two) times daily as needed for anxiety. 60 tablet 2  . Artificial Tear Solution (20/20 ARTIFICIAL TEARS OP) Apply 2 drops to eye daily as needed.     . Ascorbic Acid (VITAMIN C) 1000 MG tablet Take 1,000 mg by mouth in the morning and at bedtime.     . Cholecalciferol (VITAMIN D3) 50 MCG (2000 UT) TABS Take 1 tablet by mouth in the morning and at bedtime.    . gabapentin (NEURONTIN) 300 MG capsule Take 1 capsule (300 mg total) by mouth at bedtime. 30 capsule 1  . lidocaine-prilocaine (EMLA) cream Apply generously to the port area 45-60 mins prior. 30 g 0  . MELATONIN PO Take 5 mg by mouth daily as needed.     . Omega-3 Fatty Acids (FISH OIL) 1000 MG CAPS Take 2 capsules by mouth daily.    . ondansetron (ZOFRAN) 8 MG tablet One pill every 8 hours as needed for nausea/vomitting. 40 tablet 1  . polyethylene glycol (MIRALAX / GLYCOLAX) 17 g packet Take 17 g by mouth  daily as needed.     . Probiotic Product (PROBIOTIC-10) CHEW Chew 1 capsule by mouth.    . prochlorperazine (COMPAZINE) 10 MG tablet Take 1 tablet (10 mg total) by mouth every 6 (six) hours as needed for nausea or vomiting. 40 tablet 1  . promethazine (PHENERGAN) 25 MG tablet Take 1 tablet (25 mg total) by mouth every 6 (six) hours as needed for nausea or vomiting. 30 tablet 0  . sertraline (ZOLOFT) 100 MG tablet Take 1 tablet (100 mg total) by mouth daily. 90 tablet 3  . SYNTHROID 75 MCG tablet TAKE 1 TABLET (75 MCG TOTAL) BY MOUTH DAILY BEFORE BREAKFAST. 90 tablet 3  . traMADol (ULTRAM) 50 MG tablet Take 1 tablet (50 mg total) by mouth every 6 (  six) hours as needed for moderate pain or severe pain. 30 tablet 0  . zolpidem (AMBIEN) 10 MG tablet TAKE 1 TABLET BY MOUTH EVERY DAY AT BEDTIME AS NEEDED FOR SLEEP 30 tablet 1   No current facility-administered medications for this visit.   Facility-Administered Medications Ordered in Other Visits  Medication Dose Route Frequency Provider Last Rate Last Admin  . heparin lock flush 100 unit/mL  500 Units Intravenous Once Lloyd Huger, MD      . PACLitaxel (TAXOL) 126 mg in sodium chloride 0.9 % 250 mL chemo infusion (</= $RemoveBefor'80mg'YMOWhCGeIgDy$ /m2)  80 mg/m2 (Order-Specific) Intravenous Once Lloyd Huger, MD 271 mL/hr at 07/24/20 1127 126 mg at 07/24/20 1127  . sodium chloride flush (NS) 0.9 % injection 10 mL  10 mL Intravenous PRN Lloyd Huger, MD   10 mL at 07/24/20 0909    VITAL SIGNS: There were no vitals taken for this visit. There were no vitals filed for this visit.  Estimated body mass index is 19.45 kg/m as calculated from the following:   Height as of 12/06/19: $RemoveBef'5\' 6"'LexXNbvvEE$  (1.676 m).   Weight as of an earlier encounter on 07/24/20: 120 lb 8 oz (54.7 kg).  LABS: CBC:    Component Value Date/Time   WBC 4.8 07/24/2020 0909   HGB 10.8 (L) 07/24/2020 0909   HCT 33.4 (L) 07/24/2020 0909   PLT 164 07/24/2020 0909   MCV 92.0 07/24/2020 0909    NEUTROABS 3.3 07/24/2020 0909   LYMPHSABS 1.0 07/24/2020 0909   MONOABS 0.3 07/24/2020 0909   EOSABS 0.3 07/24/2020 0909   BASOSABS 0.0 07/24/2020 0909   Comprehensive Metabolic Panel:    Component Value Date/Time   NA 135 07/24/2020 0909   K 4.2 07/24/2020 0909   CL 100 07/24/2020 0909   CO2 26 07/24/2020 0909   BUN 19 07/24/2020 0909   CREATININE 0.88 07/24/2020 0909   CREATININE 0.78 03/08/2014 0822   GLUCOSE 116 (H) 07/24/2020 0909   CALCIUM 9.1 07/24/2020 0909   AST 26 07/24/2020 0909   ALT 19 07/24/2020 0909   ALKPHOS 86 07/24/2020 0909   BILITOT 0.6 07/24/2020 0909   PROT 7.0 07/24/2020 0909   ALBUMIN 3.8 07/24/2020 0909    RADIOGRAPHIC STUDIES: No results found.  PERFORMANCE STATUS (ECOG) : 1 - Symptomatic but completely ambulatory  Review of Systems Unless otherwise noted, a complete review of systems is negative.  Physical Exam General: NAD Pulmonary: Unlabored Skin: no rashes Neurological: Grossly nonfocal  IMPRESSION: I met with patient in the infusion area while she was receiving treatment.  I introduced palliative care services and the concept of supportive care at the cancer center.  Patient states that she had significant anxiety when Dr. Grayland Ormond mentioned the term palliative care as she associated it with hospice.    At baseline, patient reports that she is doing reasonably well.  She is functionally independent at home.  She reports having good support from her family.  She is physically active and still plays tennis.  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  This is bothering her particularly at night.  She was started today on gabapentin by Dr. Grayland Ormond.  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  Patient denies other distressing symptoms.  Patient's goals seem clearly aligned with ongoing treatment.  PLAN: -Continue current scope of  treatment -Agree with gabapentin at bedtime.  Can increase frequency during the day if needed  and tolerated.   -Will benefit from future conversation regarding ACP -Follow-up as needed  Case and plan discussed with Dr. Grayland Ormond  Patient expressed understanding and was in agreement with this plan. She also understands that She can call the clinic at any time with any questions, concerns, or complaints.     Time Total: 15 minutes  Visit consisted of counseling and education dealing with the complex and emotionally intense issues of symptom management and palliative care in the setting of serious and potentially life-threatening illness.Greater than 50%  of this time was spent counseling and coordinating care related to the above assessment and plan.  Signed by: Altha Harm, PhD, NP-C

## 2020-07-24 NOTE — Progress Notes (Signed)
Patient tolerated infusion well. Patient and VSS. Discharged home  

## 2020-07-27 NOTE — Progress Notes (Signed)
[place]  [phone] [phone]  ID: Amanda Pena OB: [birth year]  MR#: [id number]  VFI#:[id number]  Patient Care Team: Cassandria Anger, MD as PCP - General (Internal Medicine) Arvella Nigh, MD as Consulting Physician (Obstetrics and Gynecology) Clent Jacks, RN as Oncology Nurse Navigator  CHIEF COMPLAINT: Recurrent platinum resistant ovarian cancer.  INTERVAL HISTORY: Patient returns to clinic today for further evaluation and consideration of cycle 2, day 15 of Taxol and Avastin.  She continues to tolerate her treatments well without significant side effects.  The neuropathy in her hands is unchanged, but she only initiated gabapentin 2 to 3 days ago. She has no other neurologic complaints.  She denies any recent fevers or illnesses.  She has a good appetite denies weight loss.  She has no chest pain, shortness of breath, cough, or hemoptysis.  She denies any nausea, vomiting, constipation, or diarrhea.  She has no urinary complaints.  Patient offers no further specific complaints today.  REVIEW OF SYSTEMS:   Review of Systems  Constitutional: Negative.  Negative for fever, malaise/fatigue and weight loss.  Respiratory: Negative.  Negative for cough, hemoptysis and shortness of breath.   Cardiovascular: Negative.  Negative for chest pain and leg swelling.  Gastrointestinal: Negative.  Negative for abdominal pain and nausea.  Genitourinary: Negative.  Negative for dysuria.  Musculoskeletal: Negative.  Negative for back pain.  Skin: Negative.  Negative for rash.  Neurological: Positive for sensory change. Negative for dizziness, focal weakness, weakness and headaches.  Psychiatric/Behavioral: Negative.  The patient is not nervous/anxious.     As per HPI. Otherwise, a complete review of systems is negative.  PAST MEDICAL HISTORY: Past Medical History:  Diagnosis Date  . Cancer ([place])   . Family history of breast cancer   .  Family history of lung cancer   . HYPERLIPIDEMIA 04/09/2008   Qualifier: Diagnosis of  By: Wynona Luna   . HYPOTHYROIDISM 04/09/2008   Qualifier: Diagnosis of  By: Wynona Luna   . INSOMNIA, CHRONIC 08/21/2010   Qualifier: Diagnosis of  By: Wynona Luna   . PERSONAL HISTORY MALIGNANT NEOPLASM THYROID 08/21/2010   Qualifier: Diagnosis of  By: Wynona Luna     PAST SURGICAL HISTORY: Past Surgical History:  Procedure Laterality Date  . IR IMAGING GUIDED PORT INSERTION  07/18/2019  . THYROIDECTOMY, PARTIAL      FAMILY HISTORY: Family History  Problem Relation Age of Onset  . Arthritis Mother   . Hyperlipidemia Mother   . Hyperlipidemia Father   . Diabetes Father   . Hypertension Father   . Heart disease Father 73       CAD  . Lung cancer Father   . Breast cancer Maternal Grandmother        in 71s  . Cancer Maternal Aunt        unk type  . Cancer Maternal Uncle        unk type  . Cancer Paternal Uncle        unk type, d. 3s  . Skin cancer Daughter        basal cell     ADVANCED DIRECTIVES (Y/N):  N  HEALTH MAINTENANCE: Social History   Tobacco Use  . Smoking status: Never Smoker  . Smokeless tobacco: Never Used  Vaping Use  . Vaping Use: Never used  Substance Use Topics  . Alcohol use: Not Currently  . Drug use: No  Colonoscopy:  PAP:  Bone density:  Lipid panel:  No Known Allergies  Current Outpatient Medications  Medication Sig Dispense Refill  . ALPRAZolam (XANAX) 0.5 MG tablet Take 1 tablet (0.5 mg total) by mouth 2 (two) times daily as needed for anxiety. 60 tablet 2  . Artificial Tear Solution (20/20 ARTIFICIAL TEARS OP) Apply 2 drops to eye daily as needed.     . Ascorbic Acid (VITAMIN C) 1000 MG tablet Take 1,000 mg by mouth in the morning and at bedtime.     . Cholecalciferol (VITAMIN D3) 50 MCG (2000 UT) TABS Take 1 tablet by mouth in the morning and at bedtime.    . gabapentin (NEURONTIN) 300 MG capsule Take 1 capsule (300  mg total) by mouth at bedtime. 30 capsule 1  . lidocaine-prilocaine (EMLA) cream Apply generously to the port area 45-60 mins prior. 30 g 0  . MELATONIN PO Take 5 mg by mouth daily as needed.     . Omega-3 Fatty Acids (FISH OIL) 1000 MG CAPS Take 2 capsules by mouth daily.    . ondansetron (ZOFRAN) 8 MG tablet One pill every 8 hours as needed for nausea/vomitting. 40 tablet 1  . polyethylene glycol (MIRALAX / GLYCOLAX) 17 g packet Take 17 g by mouth daily as needed.     . Probiotic Product (PROBIOTIC-10) CHEW Chew 1 capsule by mouth.    . prochlorperazine (COMPAZINE) 10 MG tablet Take 1 tablet (10 mg total) by mouth every 6 (six) hours as needed for nausea or vomiting. 40 tablet 1  . promethazine (PHENERGAN) 25 MG tablet Take 1 tablet (25 mg total) by mouth every 6 (six) hours as needed for nausea or vomiting. 30 tablet 0  . sertraline (ZOLOFT) 100 MG tablet Take 1 tablet (100 mg total) by mouth daily. 90 tablet 3  . SYNTHROID 75 MCG tablet TAKE 1 TABLET (75 MCG TOTAL) BY MOUTH DAILY BEFORE BREAKFAST. 90 tablet 3  . traMADol (ULTRAM) 50 MG tablet Take 1 tablet (50 mg total) by mouth every 6 (six) hours as needed for moderate pain or severe pain. 30 tablet 0  . zolpidem (AMBIEN) 10 MG tablet TAKE 1 TABLET BY MOUTH EVERY DAY AT BEDTIME AS NEEDED FOR SLEEP 30 tablet 1   No current facility-administered medications for this visit.    OBJECTIVE: Vitals:   07/31/20 0919  BP: 127/87  Pulse: 89  Resp: 20  Temp: 98.2 F (36.8 C)     Body mass index is 20.64 kg/m.    ECOG FS:1 - Symptomatic but completely ambulatory  General: Well-developed, well-nourished, no acute distress. Eyes: Pink conjunctiva, anicteric sclera. HEENT: Normocephalic, moist mucous membranes. Lungs: No audible wheezing or coughing. Heart: Regular rate and rhythm. Abdomen: Soft, nontender, no obvious distention. Musculoskeletal: No edema, cyanosis, or clubbing. Neuro: Alert, answering all questions appropriately. Cranial  nerves grossly intact. Skin: No rashes or petechiae noted. Psych: Normal affect.  LAB RESULTS:  Lab Results  Component Value Date   NA 139 07/31/2020   K 4.0 07/31/2020   CL 104 07/31/2020   CO2 26 07/31/2020   GLUCOSE 110 (H) 07/31/2020   BUN 23 07/31/2020   CREATININE 0.85 07/31/2020   CALCIUM 9.3 07/31/2020   PROT 7.3 07/31/2020   ALBUMIN 3.8 07/31/2020   AST 33 07/31/2020   ALT 24 07/31/2020   ALKPHOS 89 07/31/2020   BILITOT 0.4 07/31/2020   GFRNONAA >60 07/31/2020   GFRAA >60 01/29/2020    Lab Results  Component Value Date  WBC 4.5 07/31/2020   NEUTROABS 2.9 07/31/2020   HGB 10.9 (L) 07/31/2020   HCT 33.3 (L) 07/31/2020   MCV 92.8 07/31/2020   PLT 181 07/31/2020     STUDIES: No results found.  ASSESSMENT:  Recurrent platinum resistant ovarian cancer.  PLAN:   1.  Recurrent platinum resistant ovarian cancer: Patient's CA-[facility] at [facility] prior to initiating treatment was 352.6.  Her most recent result was reported at 239.0. CT scan results from June 18, 2020 reviewed independently revealing large volume ascites with peritoneal thickening and enlarging retroperitoneal lymphadenopathy concerning for progressive disease.  She also had indeterminate enlarging of a left supraclavicular lymph node as well as a new right lower lobe pulmonary nodule.  Previously patient has been treated with surgery, carboplatinum and Taxol, liposomal doxorubicin with Avastin plus or minus atezolizumab on clinical trial GY009.  She received cycle 1 of weekly Taxol on days 1, 8, and 15 along with Avastin on days 1 and 15 at [facility].  Proceed with cycle 2, day 15 of treatment today.  Patient has also requested a second opinion at [facility].  Return to clinic in 2 weeks for further evaluation and consideration of cycle 3, day 1.   2.  Neuropathy: Patient admits to occasional balance issues and problems sleeping.  Patient only started 300 mg gabapentin several days ago  therefore has not had time to have an effect.  She has been instructed that she can increase it to 300 mg twice daily if tolerated.  Consider occupational therapy referral in the near future. 3.  Anemia: Chronic and unchanged.  Patient's hemoglobin is 10.9. 4.  Insomnia: Continue Ambien.  Gabapentin as above.  Patient expressed understanding and was in agreement with this plan. She also understands that She can call clinic at any time with any questions, concerns, or complaints.   Cancer Staging Malignant neoplasm of ovary ([place]) Staging form: Ovary, Fallopian Tube, and Primary Peritoneal Carcinoma, AJCC 8th Edition - Clinical: FIGO Stage IVA (pM1a) - Signed by Lloyd Huger, MD on 07/17/2020   Lloyd Huger, MD   07/31/2020 2:51 PM

## 2020-07-31 ENCOUNTER — Inpatient Hospital Stay: Payer: Self-pay

## 2020-07-31 ENCOUNTER — Inpatient Hospital Stay (HOSPITAL_BASED_OUTPATIENT_CLINIC_OR_DEPARTMENT_OTHER): Payer: Self-pay | Admitting: Oncology

## 2020-07-31 ENCOUNTER — Encounter: Payer: Self-pay | Admitting: Oncology

## 2020-07-31 VITALS — BP 127/87 | HR 89 | Temp 98.2°F | Resp 20 | Wt 127.9 lb

## 2020-07-31 DIAGNOSIS — C482 Malignant neoplasm of peritoneum, unspecified: Secondary | ICD-10-CM

## 2020-07-31 DIAGNOSIS — C786 Secondary malignant neoplasm of retroperitoneum and peritoneum: Secondary | ICD-10-CM

## 2020-07-31 DIAGNOSIS — C569 Malignant neoplasm of unspecified ovary: Secondary | ICD-10-CM

## 2020-07-31 LAB — CBC WITH DIFFERENTIAL/PLATELET
Abs Immature Granulocytes: 0.03 10*3/uL (ref 0.00–0.07)
Basophils Absolute: 0 10*3/uL (ref 0.0–0.1)
Basophils Relative: 1 %
Eosinophils Absolute: 0.2 10*3/uL (ref 0.0–0.5)
Eosinophils Relative: 4 %
HCT: 33.3 % — ABNORMAL LOW (ref 36.0–46.0)
Hemoglobin: 10.9 g/dL — ABNORMAL LOW (ref 12.0–15.0)
Immature Granulocytes: 1 %
Lymphocytes Relative: 25 %
Lymphs Abs: 1.1 10*3/uL (ref 0.7–4.0)
MCH: 30.4 pg (ref 26.0–34.0)
MCHC: 32.7 g/dL (ref 30.0–36.0)
MCV: 92.8 fL (ref 80.0–100.0)
Monocytes Absolute: 0.2 10*3/uL (ref 0.1–1.0)
Monocytes Relative: 5 %
Neutro Abs: 2.9 10*3/uL (ref 1.7–7.7)
Neutrophils Relative %: 64 %
Platelets: 181 10*3/uL (ref 150–400)
RBC: 3.59 MIL/uL — ABNORMAL LOW (ref 3.87–5.11)
RDW: 16.7 % — ABNORMAL HIGH (ref 11.5–15.5)
WBC: 4.5 10*3/uL (ref 4.0–10.5)
nRBC: 0 % (ref 0.0–0.2)

## 2020-07-31 LAB — COMPREHENSIVE METABOLIC PANEL
ALT: 24 U/L (ref 0–44)
AST: 33 U/L (ref 15–41)
Albumin: 3.8 g/dL (ref 3.5–5.0)
Alkaline Phosphatase: 89 U/L (ref 38–126)
Anion gap: 9 (ref 5–15)
BUN: 23 mg/dL (ref 8–23)
CO2: 26 mmol/L (ref 22–32)
Calcium: 9.3 mg/dL (ref 8.9–10.3)
Chloride: 104 mmol/L (ref 98–111)
Creatinine, Ser: 0.85 mg/dL (ref 0.44–1.00)
GFR, Estimated: >60 mL/min (ref >60)
Glucose, Bld: 110 mg/dL — ABNORMAL HIGH (ref 70–99)
Potassium: 4 mmol/L (ref 3.5–5.1)
Sodium: 139 mmol/L (ref 135–145)
Total Bilirubin: 0.4 mg/dL (ref 0.3–1.2)
Total Protein: 7.3 g/dL (ref 6.5–8.1)

## 2020-07-31 LAB — URINALYSIS, DIPSTICK ONLY
Bilirubin Urine: NEGATIVE
Glucose, UA: NEGATIVE mg/dL
Hgb urine dipstick: NEGATIVE
Ketones, ur: NEGATIVE mg/dL
Nitrite: NEGATIVE
Protein, ur: 30 mg/dL — AB
Specific Gravity, Urine: 1.027 (ref 1.005–1.030)
pH: 5 (ref 5.0–8.0)

## 2020-07-31 MED ORDER — SODIUM CHLORIDE 0.9% FLUSH
10.0000 mL | Freq: Once | INTRAVENOUS | Status: AC
  Administered 2020-07-31: 10 mL via INTRAVENOUS
  Filled 2020-07-31: qty 10

## 2020-07-31 MED ORDER — SODIUM CHLORIDE 0.9 % IV SOLN
10.0000 mg/kg | Freq: Once | INTRAVENOUS | Status: AC
  Administered 2020-07-31: 600 mg via INTRAVENOUS
  Filled 2020-07-31: qty 16

## 2020-07-31 MED ORDER — SODIUM CHLORIDE 0.9 % IV SOLN
Freq: Once | INTRAVENOUS | Status: AC
  Filled 2020-07-31: qty 250

## 2020-07-31 MED ORDER — HEPARIN SOD (PORK) LOCK FLUSH 100 UNIT/ML IV SOLN
500.0000 [IU] | Freq: Once | INTRAVENOUS | Status: AC | PRN
  Administered 2020-07-31: 500 [IU]
  Filled 2020-07-31: qty 5

## 2020-07-31 MED ORDER — HEPARIN SOD (PORK) LOCK FLUSH 100 UNIT/ML IV SOLN
INTRAVENOUS | Status: AC
  Filled 2020-07-31: qty 5

## 2020-07-31 MED ORDER — SODIUM CHLORIDE 0.9 % IV SOLN
80.0000 mg/m2 | Freq: Once | INTRAVENOUS | Status: AC
  Administered 2020-07-31: 126 mg via INTRAVENOUS
  Filled 2020-07-31: qty 21

## 2020-07-31 MED ORDER — FAMOTIDINE IN NACL 20-0.9 MG/50ML-% IV SOLN
20.0000 mg | Freq: Once | INTRAVENOUS | Status: AC
  Administered 2020-07-31: 20 mg via INTRAVENOUS
  Filled 2020-07-31: qty 50

## 2020-07-31 MED ORDER — DIPHENHYDRAMINE HCL 50 MG/ML IJ SOLN
25.0000 mg | Freq: Once | INTRAMUSCULAR | Status: AC
  Administered 2020-07-31: 25 mg via INTRAVENOUS
  Filled 2020-07-31: qty 1

## 2020-07-31 MED ORDER — SODIUM CHLORIDE 0.9 % IV SOLN
10.0000 mg | Freq: Once | INTRAVENOUS | Status: AC
  Administered 2020-07-31: 10 mg via INTRAVENOUS
  Filled 2020-07-31: qty 10

## 2020-07-31 NOTE — Progress Notes (Signed)
Patient denies any concerns today.  

## 2020-07-31 NOTE — Progress Notes (Signed)
1302- Patient tolerated treatment well. Patient stable and discharged to home at this time.

## 2020-08-10 NOTE — Progress Notes (Signed)
[place]  [phone] [phone]  ID: Amanda Pena OB: 05-Feb-1956  MR#: [id number]  DVV#:[id number]  Patient Care Team: Cassandria Anger, MD as PCP - General (Internal Medicine) Arvella Nigh, MD as Consulting Physician (Obstetrics and Gynecology) Clent Jacks, RN as Oncology Nurse Navigator  CHIEF COMPLAINT: Recurrent platinum resistant ovarian cancer.  INTERVAL HISTORY: Patient returns to clinic today for further evaluation and consideration of cycle 3, day 1 of Taxol and Avastin. She continues to tolerate her treatments well without significant side effects. She recently had second opinion at [facility] who did not recommend any changes. 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 She has no other neurologic complaints.  She denies any recent fevers or illnesses.  She has a good appetite denies weight loss.  She has no chest pain, shortness of breath, cough, or hemoptysis.  She denies any nausea, vomiting, constipation, or diarrhea.  She has no urinary complaints. Patient offers no further specific complaints today.  REVIEW OF SYSTEMS:   Review of Systems  Constitutional: Negative.  Negative for fever, malaise/fatigue and weight loss.  Respiratory: Negative.  Negative for cough, hemoptysis and shortness of breath.   Cardiovascular: Negative.  Negative for chest pain and leg swelling.  Gastrointestinal: Negative.  Negative for abdominal pain and nausea.  Genitourinary: Negative.  Negative for dysuria.  Musculoskeletal: Negative.  Negative for back pain.  Skin: Negative.  Negative for rash.  Neurological: Positive for sensory change. Negative for dizziness, focal weakness, weakness and headaches.  Psychiatric/Behavioral: Negative.  The patient is not nervous/anxious.     As per HPI. Otherwise, a complete review of systems is negative.  PAST MEDICAL  HISTORY: Past Medical History:  Diagnosis Date  . Cancer ([place])   . Family history of breast cancer   . Family history of lung cancer   . HYPERLIPIDEMIA 04/09/2008   Qualifier: Diagnosis of  By: Wynona Luna   . HYPOTHYROIDISM 04/09/2008   Qualifier: Diagnosis of  By: Wynona Luna   . INSOMNIA, CHRONIC 08/21/2010   Qualifier: Diagnosis of  By: Wynona Luna   . PERSONAL HISTORY MALIGNANT NEOPLASM THYROID 08/21/2010   Qualifier: Diagnosis of  By: Wynona Luna     PAST SURGICAL HISTORY: Past Surgical History:  Procedure Laterality Date  . IR IMAGING GUIDED PORT INSERTION  07/18/2019  . THYROIDECTOMY, PARTIAL      FAMILY HISTORY: Family History  Problem Relation Age of Onset  . Arthritis Mother   . Hyperlipidemia Mother   . Hyperlipidemia Father   . Diabetes Father   . Hypertension Father   . Heart disease Father 107       CAD  . Lung cancer Father   . Breast cancer Maternal Grandmother        in 95s  . Cancer Maternal Aunt        unk type  . Cancer Maternal Uncle        unk type  . Cancer Paternal Uncle        unk type, d. 10s  . Skin cancer Daughter        basal cell     ADVANCED DIRECTIVES (Y/N):  N  HEALTH MAINTENANCE: Social History   Tobacco Use  . Smoking status: Never Smoker  . Smokeless tobacco: Never Used  Vaping Use  . Vaping Use: Never used  Substance Use Topics  . Alcohol  use: Not Currently  . Drug use: No     Colonoscopy:  PAP:  Bone density:  Lipid panel:  No Known Allergies  Current Outpatient Medications  Medication Sig Dispense Refill  . ALPRAZolam (XANAX) 0.5 MG tablet Take 1 tablet (0.5 mg total) by mouth 2 (two) times daily as needed for anxiety. 60 tablet 2  . Artificial Tear Solution (20/20 ARTIFICIAL TEARS OP) Apply 2 drops to eye daily as needed.     . Ascorbic Acid (VITAMIN C) 1000 MG tablet Take 1,000 mg by mouth in the morning and at bedtime.     . Cholecalciferol (VITAMIN D3) 50 MCG (2000 UT) TABS Take 1  tablet by mouth in the morning and at bedtime.    . DULoxetine (CYMBALTA) 20 MG capsule Take 1 capsule (20 mg total) by mouth daily. 30 capsule 2  . gabapentin (NEURONTIN) 300 MG capsule Take 1 capsule (300 mg total) by mouth at bedtime. 30 capsule 1  . lidocaine-prilocaine (EMLA) cream Apply generously to the port area 45-60 mins prior. 30 g 0  . MELATONIN PO Take 5 mg by mouth daily as needed.     . Omega-3 Fatty Acids (FISH OIL) 1000 MG CAPS Take 2 capsules by mouth daily.    . ondansetron (ZOFRAN) 8 MG tablet One pill every 8 hours as needed for nausea/vomitting. 40 tablet 1  . polyethylene glycol (MIRALAX / GLYCOLAX) 17 g packet Take 17 g by mouth daily as needed.     . Probiotic Product (PROBIOTIC-10) CHEW Chew 1 capsule by mouth.    . prochlorperazine (COMPAZINE) 10 MG tablet Take 1 tablet (10 mg total) by mouth every 6 (six) hours as needed for nausea or vomiting. 40 tablet 1  . promethazine (PHENERGAN) 25 MG tablet Take 1 tablet (25 mg total) by mouth every 6 (six) hours as needed for nausea or vomiting. 30 tablet 0  . senna-docusate (SENOKOT-S) 8.6-50 MG tablet Take by mouth.    . SYNTHROID 75 MCG tablet TAKE 1 TABLET (75 MCG TOTAL) BY MOUTH DAILY BEFORE BREAKFAST. 90 tablet 3  . traMADol (ULTRAM) 50 MG tablet Take 1 tablet (50 mg total) by mouth every 6 (six) hours as needed for moderate pain or severe pain. 30 tablet 0  . zolpidem (AMBIEN) 10 MG tablet TAKE 1 TABLET BY MOUTH EVERY DAY AT BEDTIME AS NEEDED FOR SLEEP 30 tablet 1   No current facility-administered medications for this visit.    OBJECTIVE: Vitals:   08/14/20 0921  BP: 115/73  Pulse: 89  Temp: 98.7 F (37.1 C)  SpO2: 99%     Body mass index is 19.97 kg/m.    ECOG FS:1 - Symptomatic but completely ambulatory  General: Well-developed, well-nourished, no acute distress. Eyes: Pink conjunctiva, anicteric sclera. HEENT: Normocephalic, moist mucous membranes. Lungs: No audible wheezing or coughing. Heart: Regular  rate and rhythm. Abdomen: Soft, nontender, no obvious distention. Musculoskeletal: No edema, cyanosis, or clubbing. Neuro: Alert, answering all questions appropriately. Cranial nerves grossly intact. Skin: No rashes or petechiae noted. Psych: Normal affect.   LAB RESULTS:  Lab Results  Component Value Date   NA 137 08/14/2020   K 4.1 08/14/2020   CL 102 08/14/2020   CO2 26 08/14/2020   GLUCOSE 143 (H) 08/14/2020   BUN 17 08/14/2020   CREATININE 0.77 08/14/2020   CALCIUM 9.0 08/14/2020   PROT 7.4 08/14/2020   ALBUMIN 3.8 08/14/2020   AST 28 08/14/2020   ALT 24 08/14/2020   ALKPHOS 105 08/14/2020  BILITOT 0.6 08/14/2020   GFRNONAA >60 08/14/2020   GFRAA >60 01/29/2020    Lab Results  Component Value Date   WBC 4.5 08/14/2020   NEUTROABS 2.9 08/14/2020   HGB 11.1 (L) 08/14/2020   HCT 33.8 (L) 08/14/2020   MCV 92.6 08/14/2020   PLT 183 08/14/2020     STUDIES: No results found.  ASSESSMENT:  Recurrent platinum resistant ovarian cancer.  PLAN:   1.  Recurrent platinum resistant ovarian cancer: Patient's CA-125 at [facility] prior to initiating treatment was 352.6.  Her most recent result was reported at 239.0. Today's result is pending at time of dictation. CT scan results from June 18, 2020 reviewed independently revealing large volume ascites with peritoneal thickening and enlarging retroperitoneal lymphadenopathy concerning for progressive disease.  She also had indeterminate enlarging of a left supraclavicular lymph node as well as a new right lower lobe pulmonary nodule.  Previously patient has been treated with surgery, carboplatinum and Taxol, liposomal doxorubicin with Avastin plus or minus atezolizumab on clinical trial GY009.  She received cycle 1 of weekly Taxol on days 1, 8, and 15 along with Avastin on days 1 and 15 at [facility]. Proceed with cycle 3, day 1 of treatment today. Return to clinic in 1 week for further evaluation and consideration of  cycle 3, day 8 which is Taxol only. Will reimage with PET scan at the conclusion of cycle 3.  2.  Neuropathy: Chronic and unchanged. Patient admits to occasional balance issues and problems sleeping. Patient is asked to be switched to Cymbalta. Gabapentin has been discontinued. Consider occupational therapy referral in the near future. 3.  Anemia: Chronic and unchanged. Patient's hemoglobin is 11.1 today. 4.  Insomnia: Chronic and unchanged. Continue Ambien as needed.  Patient expressed understanding and was in agreement with this plan. She also understands that She can call clinic at any time with any questions, concerns, or complaints.   Cancer Staging Malignant neoplasm of ovary ([place]) Staging form: Ovary, Fallopian Tube, and Primary Peritoneal Carcinoma, AJCC 8th Edition - Clinical: FIGO Stage IVA (pM1a) - Signed by Lloyd Huger, MD on 07/17/2020   Lloyd Huger, MD   08/15/2020 6:19 AM

## 2020-08-14 ENCOUNTER — Inpatient Hospital Stay: Payer: Self-pay

## 2020-08-14 ENCOUNTER — Inpatient Hospital Stay (HOSPITAL_BASED_OUTPATIENT_CLINIC_OR_DEPARTMENT_OTHER): Payer: Self-pay | Admitting: Oncology

## 2020-08-14 VITALS — BP 123/81 | HR 77 | Temp 99.0°F | Resp 20

## 2020-08-14 VITALS — BP 115/73 | HR 89 | Temp 98.7°F | Wt 123.7 lb

## 2020-08-14 DIAGNOSIS — C569 Malignant neoplasm of unspecified ovary: Secondary | ICD-10-CM

## 2020-08-14 DIAGNOSIS — C482 Malignant neoplasm of peritoneum, unspecified: Secondary | ICD-10-CM

## 2020-08-14 DIAGNOSIS — C786 Secondary malignant neoplasm of retroperitoneum and peritoneum: Secondary | ICD-10-CM

## 2020-08-14 LAB — URINALYSIS, DIPSTICK ONLY
Bilirubin Urine: NEGATIVE
Glucose, UA: NEGATIVE mg/dL
Hgb urine dipstick: NEGATIVE
Ketones, ur: 5 mg/dL — AB
Nitrite: NEGATIVE
Protein, ur: 30 mg/dL — AB
Specific Gravity, Urine: 1.023 (ref 1.005–1.030)
pH: 5 (ref 5.0–8.0)

## 2020-08-14 LAB — CBC WITH DIFFERENTIAL/PLATELET
Abs Immature Granulocytes: 0.01 10*3/uL (ref 0.00–0.07)
Basophils Absolute: 0 10*3/uL (ref 0.0–0.1)
Basophils Relative: 0 %
Eosinophils Absolute: 0.1 10*3/uL (ref 0.0–0.5)
Eosinophils Relative: 1 %
HCT: 33.8 % — ABNORMAL LOW (ref 36.0–46.0)
Hemoglobin: 11.1 g/dL — ABNORMAL LOW (ref 12.0–15.0)
Immature Granulocytes: 0 %
Lymphocytes Relative: 26 %
Lymphs Abs: 1.2 10*3/uL (ref 0.7–4.0)
MCH: 30.4 pg (ref 26.0–34.0)
MCHC: 32.8 g/dL (ref 30.0–36.0)
MCV: 92.6 fL (ref 80.0–100.0)
Monocytes Absolute: 0.4 10*3/uL (ref 0.1–1.0)
Monocytes Relative: 9 %
Neutro Abs: 2.9 10*3/uL (ref 1.7–7.7)
Neutrophils Relative %: 64 %
Platelets: 183 10*3/uL (ref 150–400)
RBC: 3.65 MIL/uL — ABNORMAL LOW (ref 3.87–5.11)
RDW: 17 % — ABNORMAL HIGH (ref 11.5–15.5)
WBC: 4.5 10*3/uL (ref 4.0–10.5)
nRBC: 0 % (ref 0.0–0.2)

## 2020-08-14 LAB — COMPREHENSIVE METABOLIC PANEL
ALT: 24 U/L (ref 0–44)
AST: 28 U/L (ref 15–41)
Albumin: 3.8 g/dL (ref 3.5–5.0)
Alkaline Phosphatase: 105 U/L (ref 38–126)
Anion gap: 9 (ref 5–15)
BUN: 17 mg/dL (ref 8–23)
CO2: 26 mmol/L (ref 22–32)
Calcium: 9 mg/dL (ref 8.9–10.3)
Chloride: 102 mmol/L (ref 98–111)
Creatinine, Ser: 0.77 mg/dL (ref 0.44–1.00)
GFR, Estimated: >60 mL/min (ref >60)
Glucose, Bld: 143 mg/dL — ABNORMAL HIGH (ref 70–99)
Potassium: 4.1 mmol/L (ref 3.5–5.1)
Sodium: 137 mmol/L (ref 135–145)
Total Bilirubin: 0.6 mg/dL (ref 0.3–1.2)
Total Protein: 7.4 g/dL (ref 6.5–8.1)

## 2020-08-14 MED ORDER — SODIUM CHLORIDE 0.9 % IV SOLN
80.0000 mg/m2 | Freq: Once | INTRAVENOUS | Status: AC
  Administered 2020-08-14: 126 mg via INTRAVENOUS
  Filled 2020-08-14: qty 21

## 2020-08-14 MED ORDER — HEPARIN SOD (PORK) LOCK FLUSH 100 UNIT/ML IV SOLN
500.0000 [IU] | Freq: Once | INTRAVENOUS | Status: AC | PRN
  Administered 2020-08-14: 500 [IU]
  Filled 2020-08-14: qty 5

## 2020-08-14 MED ORDER — SODIUM CHLORIDE 0.9 % IV SOLN
Freq: Once | INTRAVENOUS | Status: AC
  Filled 2020-08-14: qty 250

## 2020-08-14 MED ORDER — SODIUM CHLORIDE 0.9 % IV SOLN
10.0000 mg | Freq: Once | INTRAVENOUS | Status: AC
  Administered 2020-08-14: 10 mg via INTRAVENOUS
  Filled 2020-08-14: qty 10

## 2020-08-14 MED ORDER — SODIUM CHLORIDE 0.9 % IV SOLN
10.0000 mg/kg | Freq: Once | INTRAVENOUS | Status: AC
Start: 2020-08-14 — End: 2020-08-14
  Administered 2020-08-14: 600 mg via INTRAVENOUS
  Filled 2020-08-14: qty 16

## 2020-08-14 MED ORDER — DULOXETINE HCL 20 MG PO CPEP: 20.0000 mg | ORAL_CAPSULE | Freq: Every day | ORAL | 2 refills | Status: DC

## 2020-08-14 MED ORDER — HEPARIN SOD (PORK) LOCK FLUSH 100 UNIT/ML IV SOLN
INTRAVENOUS | Status: AC
  Filled 2020-08-14: qty 5

## 2020-08-14 MED ORDER — FAMOTIDINE IN NACL 20-0.9 MG/50ML-% IV SOLN
20.0000 mg | Freq: Once | INTRAVENOUS | Status: AC
Start: 2020-08-14 — End: 2020-08-14
  Administered 2020-08-14: 20 mg via INTRAVENOUS
  Filled 2020-08-14: qty 50

## 2020-08-14 MED ORDER — DIPHENHYDRAMINE HCL 50 MG/ML IJ SOLN
25.0000 mg | Freq: Once | INTRAMUSCULAR | Status: AC
  Administered 2020-08-14: 25 mg via INTRAVENOUS
  Filled 2020-08-14: qty 1

## 2020-08-14 NOTE — Progress Notes (Signed)
Patient stable at discharge.

## 2020-08-15 LAB — CA 125: Cancer Antigen (CA) 125: 255 U/mL — ABNORMAL HIGH (ref 0.0–38.1)

## 2020-08-16 NOTE — Progress Notes (Signed)
[place]  [phone]) [phone] [phone]) [phone]  ID: [name] OB: Jul 22, 1955  MR#: [id number]  IOE#:[id number]  Patient Care Team: Cassandria Anger, MD as PCP - General (Internal Medicine) Arvella Nigh, MD as Consulting Physician (Obstetrics and Gynecology) Clent Jacks, RN as Oncology Nurse Navigator  CHIEF COMPLAINT: Recurrent platinum resistant ovarian cancer.  INTERVAL HISTORY: Patient returns to clinic today for further evaluation and consideration of cycle 3, day 8 of Taxol and Avastin, Taxol only today.  She feels slightly more bloating in her abdomen and increased back pain, but otherwise feels well.  She continues to tolerate her treatments without significant side effects.  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  She has no other neurologic complaints.  She denies any recent fevers or illnesses.  She has a good appetite denies weight loss.  She has no chest pain, shortness of breath, cough, or hemoptysis.  She denies any nausea, vomiting, constipation, or diarrhea.  She has no urinary complaints.  Patient offers no further specific complaints today.  REVIEW OF SYSTEMS:   Review of Systems  Constitutional: Negative.  Negative for fever, malaise/fatigue and weight loss.  Respiratory: Negative.  Negative for cough, hemoptysis and shortness of breath.   Cardiovascular: Negative.  Negative for chest pain and leg swelling.  Gastrointestinal: Negative.  Negative for abdominal pain and nausea.  Genitourinary: Negative.  Negative for dysuria.  Musculoskeletal: Positive for back pain.  Skin: Negative.  Negative for rash.  Neurological: Positive for sensory change. Negative for dizziness, focal weakness, weakness and headaches.  Psychiatric/Behavioral: Negative.  The patient is not nervous/anxious.     As per HPI. Otherwise, a complete review of systems is negative.  PAST MEDICAL HISTORY: Past Medical History:  Diagnosis  Date  . Cancer ([place])   . Family history of breast cancer   . Family history of lung cancer   . HYPERLIPIDEMIA 04/09/2008   Qualifier: Diagnosis of  By: Wynona Luna   . HYPOTHYROIDISM 04/09/2008   Qualifier: Diagnosis of  By: Wynona Luna   . INSOMNIA, CHRONIC 08/21/2010   Qualifier: Diagnosis of  By: Wynona Luna   . PERSONAL HISTORY MALIGNANT NEOPLASM THYROID 08/21/2010   Qualifier: Diagnosis of  By: Wynona Luna     PAST SURGICAL HISTORY: Past Surgical History:  Procedure Laterality Date  . IR IMAGING GUIDED PORT INSERTION  07/18/2019  . THYROIDECTOMY, PARTIAL      FAMILY HISTORY: Family History  Problem Relation Age of Onset  . Arthritis Mother   . Hyperlipidemia Mother   . Hyperlipidemia Father   . Diabetes Father   . Hypertension Father   . Heart disease Father 26       CAD  . Lung cancer Father   . Breast cancer Maternal Grandmother        in 53s  . Cancer Maternal Aunt        unk type  . Cancer Maternal Uncle        unk type  . Cancer Paternal Uncle        unk type, d. 73s  . Skin cancer Daughter        basal cell     ADVANCED DIRECTIVES (Y/N):  N  HEALTH MAINTENANCE: Social History   Tobacco Use  . Smoking status: Never Smoker  . Smokeless tobacco: Never Used  Vaping Use  . Vaping Use: Never used  Substance Use Topics  . Alcohol use:  Not Currently  . Drug use: No     Colonoscopy:  PAP:  Bone density:  Lipid panel:  No Known Allergies  Current Outpatient Medications  Medication Sig Dispense Refill  . ALPRAZolam (XANAX) 0.5 MG tablet Take 1 tablet (0.5 mg total) by mouth 2 (two) times daily as needed for anxiety. 60 tablet 2  . Artificial Tear Solution (20/20 ARTIFICIAL TEARS OP) Apply 2 drops to eye daily as needed.     . Ascorbic Acid (VITAMIN C) 1000 MG tablet Take 1,000 mg by mouth in the morning and at bedtime.     . Cholecalciferol (VITAMIN D3) 50 MCG (2000 UT) TABS Take 1 tablet by mouth in the morning and at bedtime.     . DULoxetine (CYMBALTA) 20 MG capsule Take 1 capsule (20 mg total) by mouth daily. 30 capsule 2  . gabapentin (NEURONTIN) 300 MG capsule Take 1 capsule (300 mg total) by mouth at bedtime. 30 capsule 1  . lidocaine-prilocaine (EMLA) cream Apply generously to the port area 45-60 mins prior. 30 g 0  . MELATONIN PO Take 5 mg by mouth daily as needed.     . Omega-3 Fatty Acids (FISH OIL) 1000 MG CAPS Take 2 capsules by mouth daily.    . ondansetron (ZOFRAN) 8 MG tablet One pill every 8 hours as needed for nausea/vomitting. 40 tablet 1  . polyethylene glycol (MIRALAX / GLYCOLAX) 17 g packet Take 17 g by mouth daily as needed.     . Probiotic Product (PROBIOTIC-10) CHEW Chew 1 capsule by mouth.    . prochlorperazine (COMPAZINE) 10 MG tablet Take 1 tablet (10 mg total) by mouth every 6 (six) hours as needed for nausea or vomiting. 40 tablet 1  . promethazine (PHENERGAN) 25 MG tablet Take 1 tablet (25 mg total) by mouth every 6 (six) hours as needed for nausea or vomiting. 30 tablet 0  . senna-docusate (SENOKOT-S) 8.6-50 MG tablet Take by mouth.    . SYNTHROID 75 MCG tablet TAKE 1 TABLET (75 MCG TOTAL) BY MOUTH DAILY BEFORE BREAKFAST. 90 tablet 3  . traMADol (ULTRAM) 50 MG tablet Take 1 tablet (50 mg total) by mouth every 6 (six) hours as needed for moderate pain or severe pain. 30 tablet 0  . zolpidem (AMBIEN) 10 MG tablet TAKE 1 TABLET BY MOUTH EVERY DAY AT BEDTIME AS NEEDED FOR SLEEP 30 tablet 1   No current facility-administered medications for this visit.    OBJECTIVE: Vitals:   08/21/20 1020  BP: 126/76  Pulse: 99  Temp: (!) 97.1 F (36.2 C)  SpO2: 99%     Body mass index is 19.84 kg/m.    ECOG FS:1 - Symptomatic but completely ambulatory  General: Well-developed, well-nourished, no acute distress. Eyes: Pink conjunctiva, anicteric sclera. HEENT: Normocephalic, moist mucous membranes. Lungs: No audible wheezing or coughing. Heart: Regular rate and rhythm. Abdomen: Soft, nontender,  no obvious distention. Musculoskeletal: No edema, cyanosis, or clubbing. Neuro: Alert, answering all questions appropriately. Cranial nerves grossly intact. Skin: No rashes or petechiae noted. Psych: Normal affect.   LAB RESULTS:  Lab Results  Component Value Date   NA 138 08/21/2020   K 4.1 08/21/2020   CL 102 08/21/2020   CO2 25 08/21/2020   GLUCOSE 132 (H) 08/21/2020   BUN 21 08/21/2020   CREATININE 0.82 08/21/2020   CALCIUM 9.5 08/21/2020   PROT 7.6 08/21/2020   ALBUMIN 4.0 08/21/2020   AST 29 08/21/2020   ALT 24 08/21/2020   ALKPHOS 97 08/21/2020  BILITOT 0.8 08/21/2020   GFRNONAA >60 08/21/2020   GFRAA >60 01/29/2020    Lab Results  Component Value Date   WBC 5.4 08/21/2020   NEUTROABS 3.6 08/21/2020   HGB 11.5 (L) 08/21/2020   HCT 34.6 (L) 08/21/2020   MCV 91.5 08/21/2020   PLT 207 08/21/2020     STUDIES: No results found.  ASSESSMENT:  Recurrent platinum resistant ovarian cancer.  PLAN:   1.  Recurrent platinum resistant ovarian cancer: Patient's CA-125 at [facility] prior to initiating treatment was 352.6.  She initially trended down to 239, but now has trended up slightly to 261. CT scan results from June 18, 2020 reviewed independently revealing large volume ascites with peritoneal thickening and enlarging retroperitoneal lymphadenopathy concerning for progressive disease.  She also had indeterminate enlarging of a left supraclavicular lymph node as well as a new right lower lobe pulmonary nodule.  Previously patient has been treated with surgery, carboplatinum and Taxol, liposomal doxorubicin with Avastin plus or minus atezolizumab on clinical trial GY009.  She received cycle 1 of weekly Taxol on days 1, 8, and 15 along with Avastin on days 1 and 15 at [facility].  Proceed with cycle 3, day 8 of treatment today.  Taxol only.  Return to clinic in 1 week for further evaluation and consideration of cycle 3, day 15.  She will also have  consultation with gynecology oncology next week. Will reimage with PET scan at the conclusion of cycle 3.  2.  Neuropathy: Chronic and unchanged. Patient admits to occasional balance issues and problems sleeping. Patient is asked to be switched to Cymbalta. Gabapentin has been discontinued. Consider occupational therapy referral in the near future. 3.  Anemia: Mildly improved.  Patient's hemoglobin is 11.5 today. 4.  Insomnia: Chronic and unchanged.  Continue Ambien as needed. 5.  Abdominal bloating: We discussed the possibility of a paracentesis, patient declined at this time.  Patient expressed understanding and was in agreement with this plan. She also understands that She can call clinic at any time with any questions, concerns, or complaints.   Cancer Staging Malignant neoplasm of ovary ([place]) Staging form: Ovary, Fallopian Tube, and Primary Peritoneal Carcinoma, AJCC 8th Edition - Clinical: FIGO Stage IVA (pM1a) - Signed by Lloyd Huger, MD on 07/17/2020   Lloyd Huger, MD   08/22/2020 6:37 AM

## 2020-08-21 ENCOUNTER — Inpatient Hospital Stay: Payer: Self-pay | Attending: Oncology

## 2020-08-21 ENCOUNTER — Inpatient Hospital Stay (HOSPITAL_BASED_OUTPATIENT_CLINIC_OR_DEPARTMENT_OTHER): Payer: Self-pay | Admitting: Oncology

## 2020-08-21 ENCOUNTER — Inpatient Hospital Stay: Payer: Self-pay

## 2020-08-21 VITALS — BP 126/76 | HR 99 | Temp 97.1°F | Wt 122.9 lb

## 2020-08-21 DIAGNOSIS — R918 Other nonspecific abnormal finding of lung field: Secondary | ICD-10-CM | POA: Insufficient documentation

## 2020-08-21 DIAGNOSIS — C786 Secondary malignant neoplasm of retroperitoneum and peritoneum: Secondary | ICD-10-CM

## 2020-08-21 DIAGNOSIS — D649 Anemia, unspecified: Secondary | ICD-10-CM | POA: Insufficient documentation

## 2020-08-21 DIAGNOSIS — C569 Malignant neoplasm of unspecified ovary: Secondary | ICD-10-CM | POA: Insufficient documentation

## 2020-08-21 DIAGNOSIS — Z5111 Encounter for antineoplastic chemotherapy: Secondary | ICD-10-CM | POA: Insufficient documentation

## 2020-08-21 DIAGNOSIS — E785 Hyperlipidemia, unspecified: Secondary | ICD-10-CM | POA: Insufficient documentation

## 2020-08-21 DIAGNOSIS — Z90710 Acquired absence of both cervix and uterus: Secondary | ICD-10-CM | POA: Insufficient documentation

## 2020-08-21 DIAGNOSIS — C482 Malignant neoplasm of peritoneum, unspecified: Secondary | ICD-10-CM

## 2020-08-21 DIAGNOSIS — R590 Localized enlarged lymph nodes: Secondary | ICD-10-CM | POA: Insufficient documentation

## 2020-08-21 DIAGNOSIS — G4700 Insomnia, unspecified: Secondary | ICD-10-CM | POA: Insufficient documentation

## 2020-08-21 DIAGNOSIS — Z85850 Personal history of malignant neoplasm of thyroid: Secondary | ICD-10-CM | POA: Insufficient documentation

## 2020-08-21 DIAGNOSIS — G620 Drug-induced polyneuropathy: Secondary | ICD-10-CM | POA: Insufficient documentation

## 2020-08-21 DIAGNOSIS — G893 Neoplasm related pain (acute) (chronic): Secondary | ICD-10-CM | POA: Insufficient documentation

## 2020-08-21 DIAGNOSIS — Z90722 Acquired absence of ovaries, bilateral: Secondary | ICD-10-CM | POA: Insufficient documentation

## 2020-08-21 DIAGNOSIS — Z79899 Other long term (current) drug therapy: Secondary | ICD-10-CM | POA: Insufficient documentation

## 2020-08-21 DIAGNOSIS — R180 Malignant ascites: Secondary | ICD-10-CM | POA: Insufficient documentation

## 2020-08-21 DIAGNOSIS — E039 Hypothyroidism, unspecified: Secondary | ICD-10-CM | POA: Insufficient documentation

## 2020-08-21 LAB — COMPREHENSIVE METABOLIC PANEL
ALT: 24 U/L (ref 0–44)
AST: 29 U/L (ref 15–41)
Albumin: 4 g/dL (ref 3.5–5.0)
Alkaline Phosphatase: 97 U/L (ref 38–126)
Anion gap: 11 (ref 5–15)
BUN: 21 mg/dL (ref 8–23)
CO2: 25 mmol/L (ref 22–32)
Calcium: 9.5 mg/dL (ref 8.9–10.3)
Chloride: 102 mmol/L (ref 98–111)
Creatinine, Ser: 0.82 mg/dL (ref 0.44–1.00)
GFR, Estimated: >60 mL/min (ref >60)
Glucose, Bld: 132 mg/dL — ABNORMAL HIGH (ref 70–99)
Potassium: 4.1 mmol/L (ref 3.5–5.1)
Sodium: 138 mmol/L (ref 135–145)
Total Bilirubin: 0.8 mg/dL (ref 0.3–1.2)
Total Protein: 7.6 g/dL (ref 6.5–8.1)

## 2020-08-21 LAB — CBC WITH DIFFERENTIAL/PLATELET
Abs Immature Granulocytes: 0.03 10*3/uL (ref 0.00–0.07)
Basophils Absolute: 0 10*3/uL (ref 0.0–0.1)
Basophils Relative: 1 %
Eosinophils Absolute: 0.1 10*3/uL (ref 0.0–0.5)
Eosinophils Relative: 1 %
HCT: 34.6 % — ABNORMAL LOW (ref 36.0–46.0)
Hemoglobin: 11.5 g/dL — ABNORMAL LOW (ref 12.0–15.0)
Immature Granulocytes: 1 %
Lymphocytes Relative: 26 %
Lymphs Abs: 1.4 10*3/uL (ref 0.7–4.0)
MCH: 30.4 pg (ref 26.0–34.0)
MCHC: 33.2 g/dL (ref 30.0–36.0)
MCV: 91.5 fL (ref 80.0–100.0)
Monocytes Absolute: 0.3 10*3/uL (ref 0.1–1.0)
Monocytes Relative: 5 %
Neutro Abs: 3.6 10*3/uL (ref 1.7–7.7)
Neutrophils Relative %: 66 %
Platelets: 207 10*3/uL (ref 150–400)
RBC: 3.78 MIL/uL — ABNORMAL LOW (ref 3.87–5.11)
RDW: 16.5 % — ABNORMAL HIGH (ref 11.5–15.5)
WBC: 5.4 10*3/uL (ref 4.0–10.5)
nRBC: 0 % (ref 0.0–0.2)

## 2020-08-21 LAB — URINALYSIS, DIPSTICK ONLY
Bilirubin Urine: NEGATIVE
Glucose, UA: NEGATIVE mg/dL
Hgb urine dipstick: NEGATIVE
Ketones, ur: 5 mg/dL — AB
Nitrite: NEGATIVE
Protein, ur: NEGATIVE mg/dL
Specific Gravity, Urine: 1.026 (ref 1.005–1.030)
pH: 5 (ref 5.0–8.0)

## 2020-08-21 MED ORDER — FAMOTIDINE IN NACL 20-0.9 MG/50ML-% IV SOLN
20.0000 mg | Freq: Once | INTRAVENOUS | Status: AC
  Administered 2020-08-21: 20 mg via INTRAVENOUS
  Filled 2020-08-21: qty 50

## 2020-08-21 MED ORDER — SODIUM CHLORIDE 0.9 % IV SOLN
80.0000 mg/m2 | Freq: Once | INTRAVENOUS | Status: AC
  Administered 2020-08-21: 126 mg via INTRAVENOUS
  Filled 2020-08-21: qty 21

## 2020-08-21 MED ORDER — SODIUM CHLORIDE 0.9 % IV SOLN
10.0000 mg | Freq: Once | INTRAVENOUS | Status: AC
  Administered 2020-08-21: 10 mg via INTRAVENOUS
  Filled 2020-08-21: qty 10

## 2020-08-21 MED ORDER — SODIUM CHLORIDE 0.9% FLUSH
10.0000 mL | INTRAVENOUS | Status: DC | PRN
  Administered 2020-08-21 (×2): 10 mL via INTRAVENOUS
  Filled 2020-08-21: qty 10

## 2020-08-21 MED ORDER — HEPARIN SOD (PORK) LOCK FLUSH 100 UNIT/ML IV SOLN
500.0000 [IU] | Freq: Once | INTRAVENOUS | Status: DC
  Filled 2020-08-21: qty 5

## 2020-08-21 MED ORDER — HEPARIN SOD (PORK) LOCK FLUSH 100 UNIT/ML IV SOLN
INTRAVENOUS | Status: AC
  Filled 2020-08-21: qty 5

## 2020-08-21 MED ORDER — SODIUM CHLORIDE 0.9 % IV SOLN
Freq: Once | INTRAVENOUS | Status: AC
  Filled 2020-08-21: qty 250

## 2020-08-21 MED ORDER — HEPARIN SOD (PORK) LOCK FLUSH 100 UNIT/ML IV SOLN
500.0000 [IU] | Freq: Once | INTRAVENOUS | Status: AC | PRN
  Administered 2020-08-21: 500 [IU]
  Filled 2020-08-21: qty 5

## 2020-08-21 MED ORDER — DIPHENHYDRAMINE HCL 50 MG/ML IJ SOLN
25.0000 mg | Freq: Once | INTRAMUSCULAR | Status: AC
  Administered 2020-08-21: 25 mg via INTRAVENOUS
  Filled 2020-08-21: qty 1

## 2020-08-21 MED ORDER — SODIUM CHLORIDE 0.9% FLUSH
10.0000 mL | INTRAVENOUS | Status: DC | PRN
Start: 2020-08-21 — End: 2020-08-21
  Filled 2020-08-21: qty 10

## 2020-08-21 NOTE — Progress Notes (Signed)
Tolerated Taxol well. Discharged home in stable condition.

## 2020-08-21 NOTE — Progress Notes (Signed)
Patient here today for follow up, treatment consideration regarding ovarian cancer. Patient reports worsening ascites, intermittent back and abdominal pain. Patient also has noticed worsening acid reflux, worse in the evening. Patient reports more shortness of breath with exertion, notices it going up stairs.

## 2020-08-22 LAB — CA 125: Cancer Antigen (CA) 125: 261 U/mL — ABNORMAL HIGH (ref 0.0–38.1)

## 2020-08-26 NOTE — Progress Notes (Signed)
[place]  [phone]) [phone] Fax:(336) [phone]  ID: Amanda Pena OB: Nov 01, 1955  MR#: [id number]  [id number]  Patient Care Team: Cassandria Anger, MD as PCP - General (Internal Medicine) Arvella Nigh, MD as Consulting Physician (Obstetrics and Gynecology) Clent Jacks, RN as Oncology Nurse Navigator  CHIEF COMPLAINT: Recurrent platinum resistant ovarian cancer.  INTERVAL HISTORY: Patient returns to clinic today for further evaluation and consideration of cycle 3, day 15 of she currently feels well and is asymptomatic.  She does not complain of abdominal bloating or back pain today.  She is tolerating her treatments well without significant side effects.  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  She has no other neurologic complaints.  She denies any recent fevers or illnesses.  She has a good appetite denies weight loss.  She has no chest pain, shortness of breath, cough, or hemoptysis.  She denies any nausea, vomiting, constipation, or diarrhea.  She has no urinary complaints.  Patient offers no further specific complaints   REVIEW OF SYSTEMS:   Review of Systems  Constitutional: Negative.  Negative for fever, malaise/fatigue and weight loss.  Respiratory: Negative.  Negative for cough, hemoptysis and shortness of breath.   Cardiovascular: Negative.  Negative for chest pain and leg swelling.  Gastrointestinal: Negative.  Negative for abdominal pain and nausea.  Genitourinary: Negative.  Negative for dysuria.  Musculoskeletal: Negative.  Negative for back pain.  Skin: Negative.  Negative for rash.  Neurological: Positive for sensory change. Negative for dizziness, focal weakness, weakness and headaches.  Psychiatric/Behavioral: Negative.  The patient is not nervous/anxious.     As per HPI. Otherwise, a complete review of systems is negative.  PAST MEDICAL HISTORY: Past Medical History:  Diagnosis Date  . Cancer ([place])    . Family history of breast cancer   . Family history of lung cancer   . HYPERLIPIDEMIA 04/09/2008   Qualifier: Diagnosis of  By: Wynona Luna   . HYPOTHYROIDISM 04/09/2008   Qualifier: Diagnosis of  By: Wynona Luna   . INSOMNIA, CHRONIC 08/21/2010   Qualifier: Diagnosis of  By: Wynona Luna   . PERSONAL HISTORY MALIGNANT NEOPLASM THYROID 08/21/2010   Qualifier: Diagnosis of  By: Wynona Luna     PAST SURGICAL HISTORY: Past Surgical History:  Procedure Laterality Date  . IR IMAGING GUIDED PORT INSERTION  07/18/2019  . THYROIDECTOMY, PARTIAL      FAMILY HISTORY: Family History  Problem Relation Age of Onset  . Arthritis Mother   . Hyperlipidemia Mother   . Hyperlipidemia Father   . Diabetes Father   . Hypertension Father   . Heart disease Father 45       CAD  . Lung cancer Father   . Breast cancer Maternal Grandmother        in 73s  . Cancer Maternal Aunt        unk type  . Cancer Maternal Uncle        unk type  . Cancer Paternal Uncle        unk type, d. 59s  . Skin cancer Daughter        basal cell     ADVANCED DIRECTIVES (Y/N):  N  HEALTH MAINTENANCE: Social History   Tobacco Use  . Smoking status: Never Smoker  . Smokeless tobacco: Never Used  Vaping Use  . Vaping Use: Never used  Substance Use Topics  . Alcohol use: Not Currently  .  Drug use: No     Colonoscopy:  PAP:  Bone density:  Lipid panel:  No Known Allergies  Current Outpatient Medications  Medication Sig Dispense Refill  . ALPRAZolam (XANAX) 0.5 MG tablet Take 1 tablet (0.5 mg total) by mouth 2 (two) times daily as needed for anxiety. 60 tablet 2  . Artificial Tear Solution (20/20 ARTIFICIAL TEARS OP) Apply 2 drops to eye daily as needed.     . Ascorbic Acid (VITAMIN C) 1000 MG tablet Take 1,000 mg by mouth in the morning and at bedtime.     . Cholecalciferol (VITAMIN D3) 50 MCG (2000 UT) TABS Take 1 tablet by mouth in the morning and at bedtime.    . DULoxetine  (CYMBALTA) 20 MG capsule Take 1 capsule (20 mg total) by mouth daily. 30 capsule 2  . lidocaine-prilocaine (EMLA) cream Apply generously to the port area 45-60 mins prior. 30 g 0  . MELATONIN PO Take 5 mg by mouth daily as needed.     . Omega-3 Fatty Acids (FISH OIL) 1000 MG CAPS Take 2 capsules by mouth daily.    . ondansetron (ZOFRAN) 8 MG tablet One pill every 8 hours as needed for nausea/vomitting. 40 tablet 1  . polyethylene glycol (MIRALAX / GLYCOLAX) 17 g packet Take 17 g by mouth daily as needed.     . Probiotic Product (PROBIOTIC-10) CHEW Chew 1 capsule by mouth.    . prochlorperazine (COMPAZINE) 10 MG tablet Take 1 tablet (10 mg total) by mouth every 6 (six) hours as needed for nausea or vomiting. 40 tablet 1  . promethazine (PHENERGAN) 25 MG tablet Take 1 tablet (25 mg total) by mouth every 6 (six) hours as needed for nausea or vomiting. 30 tablet 0  . senna-docusate (SENOKOT-S) 8.6-50 MG tablet Take by mouth.    . SYNTHROID 75 MCG tablet TAKE 1 TABLET (75 MCG TOTAL) BY MOUTH DAILY BEFORE BREAKFAST. 90 tablet 3  . zolpidem (AMBIEN) 10 MG tablet TAKE 1 TABLET BY MOUTH EVERY DAY AT BEDTIME AS NEEDED FOR SLEEP 30 tablet 1   No current facility-administered medications for this visit.    OBJECTIVE: Vitals:   08/28/20 0951  BP: 110/74  Pulse: 99  Temp: 99.3 F (37.4 C)  SpO2: 99%     Body mass index is 19.74 kg/m.    ECOG FS:1 - Symptomatic but completely ambulatory  General: Well-developed, well-nourished, no acute distress. Eyes: Pink conjunctiva, anicteric sclera. HEENT: Normocephalic, moist mucous membranes. Lungs: No audible wheezing or coughing. Heart: Regular rate and rhythm. Abdomen: Soft, nontender, no obvious distention. Musculoskeletal: No edema, cyanosis, or clubbing. Neuro: Alert, answering all questions appropriately. Cranial nerves grossly intact. Skin: No rashes or petechiae noted. Psych: Normal affect.  LAB RESULTS:  Lab Results  Component Value Date    NA 135 08/28/2020   K 3.8 08/28/2020   CL 101 08/28/2020   CO2 24 08/28/2020   GLUCOSE 179 (H) 08/28/2020   BUN 21 08/28/2020   CREATININE 0.74 08/28/2020   CALCIUM 9.0 08/28/2020   PROT 7.2 08/28/2020   ALBUMIN 3.7 08/28/2020   AST 24 08/28/2020   ALT 18 08/28/2020   ALKPHOS 83 08/28/2020   BILITOT 0.5 08/28/2020   GFRNONAA >60 08/28/2020   GFRAA >60 01/29/2020    Lab Results  Component Value Date   WBC 4.7 08/28/2020   NEUTROABS 3.3 08/28/2020   HGB 10.5 (L) 08/28/2020   HCT 31.4 (L) 08/28/2020   MCV 91.5 08/28/2020   PLT 206 08/28/2020  STUDIES: No results found.  ASSESSMENT:  Recurrent platinum resistant ovarian cancer.  PLAN:   1.  Recurrent platinum resistant ovarian cancer: Patient's CA-125 at [facility] prior to initiating treatment was 352.6.  She initially trended down to 239, but now has trended up slightly to 261. CT scan results from June 18, 2020 reviewed independently revealing large volume ascites with peritoneal thickening and enlarging retroperitoneal lymphadenopathy concerning for progressive disease.  She also had indeterminate enlarging of a left supraclavicular lymph node as well as a new right lower lobe pulmonary nodule.  Previously patient has been treated with surgery, carboplatinum and Taxol, liposomal doxorubicin with Avastin plus or minus atezolizumab on clinical trial GY009.  She received cycle 1 of weekly Taxol on days 1, 8, and 15 along with Avastin on days 1 and 15 at [facility].  Proceed with cycle 3, day 15 of treatment today.  Return to clinic in 2 weeks for further evaluation and consideration of cycle we will get PET scan prior to next treatment.  Appreciate gynecology oncology input 2.  Neuropathy: Chronic and unchanged. Patient admits to occasional balance issues and problems sleeping.  Continue Cymbalta.  Consider occupational therapy referral in the near future. 3.  Anemia: Hemoglobin slightly worse today at 10.5.   Monitor. 4.  Insomnia: Chronic and unchanged.  Continue Ambien as needed. 5.  Abdominal bloating: Patient does not complain of this today.  We previously discussed the possibility of a paracentesis, but patient declined.  Patient expressed understanding and was in agreement with this plan. She also understands that She can call clinic at any time with any questions, concerns, or complaints.   Cancer Staging Malignant neoplasm of ovary ([place]) Staging form: Ovary, Fallopian Tube, and Primary Peritoneal Carcinoma, AJCC 8th Edition - Clinical: FIGO Stage IVA (pM1a) - Signed by Lloyd Huger, MD on 07/17/2020   Lloyd Huger, MD   08/29/2020 6:53 AM

## 2020-08-28 ENCOUNTER — Encounter: Payer: Self-pay | Admitting: Oncology

## 2020-08-28 ENCOUNTER — Inpatient Hospital Stay (HOSPITAL_BASED_OUTPATIENT_CLINIC_OR_DEPARTMENT_OTHER): Payer: Self-pay | Admitting: Obstetrics and Gynecology

## 2020-08-28 ENCOUNTER — Inpatient Hospital Stay (HOSPITAL_BASED_OUTPATIENT_CLINIC_OR_DEPARTMENT_OTHER): Payer: Self-pay | Admitting: Oncology

## 2020-08-28 ENCOUNTER — Inpatient Hospital Stay: Payer: Self-pay

## 2020-08-28 VITALS — BP 110/74 | HR 99 | Temp 99.3°F | Wt 122.5 lb

## 2020-08-28 VITALS — BP 110/74 | HR 99 | Temp 99.3°F | Wt 122.3 lb

## 2020-08-28 DIAGNOSIS — C482 Malignant neoplasm of peritoneum, unspecified: Secondary | ICD-10-CM

## 2020-08-28 DIAGNOSIS — C786 Secondary malignant neoplasm of retroperitoneum and peritoneum: Secondary | ICD-10-CM

## 2020-08-28 DIAGNOSIS — C569 Malignant neoplasm of unspecified ovary: Secondary | ICD-10-CM

## 2020-08-28 DIAGNOSIS — C7800 Secondary malignant neoplasm of unspecified lung: Secondary | ICD-10-CM

## 2020-08-28 LAB — CBC WITH DIFFERENTIAL/PLATELET
Abs Immature Granulocytes: 0.03 10*3/uL (ref 0.00–0.07)
Basophils Absolute: 0 10*3/uL (ref 0.0–0.1)
Basophils Relative: 0 %
Eosinophils Absolute: 0.1 10*3/uL (ref 0.0–0.5)
Eosinophils Relative: 1 %
HCT: 31.4 % — ABNORMAL LOW (ref 36.0–46.0)
Hemoglobin: 10.5 g/dL — ABNORMAL LOW (ref 12.0–15.0)
Immature Granulocytes: 1 %
Lymphocytes Relative: 23 %
Lymphs Abs: 1.1 10*3/uL (ref 0.7–4.0)
MCH: 30.6 pg (ref 26.0–34.0)
MCHC: 33.4 g/dL (ref 30.0–36.0)
MCV: 91.5 fL (ref 80.0–100.0)
Monocytes Absolute: 0.2 10*3/uL (ref 0.1–1.0)
Monocytes Relative: 5 %
Neutro Abs: 3.3 10*3/uL (ref 1.7–7.7)
Neutrophils Relative %: 70 %
Platelets: 206 10*3/uL (ref 150–400)
RBC: 3.43 MIL/uL — ABNORMAL LOW (ref 3.87–5.11)
RDW: 16.6 % — ABNORMAL HIGH (ref 11.5–15.5)
WBC: 4.7 10*3/uL (ref 4.0–10.5)
nRBC: 0 % (ref 0.0–0.2)

## 2020-08-28 LAB — COMPREHENSIVE METABOLIC PANEL
ALT: 18 U/L (ref 0–44)
AST: 24 U/L (ref 15–41)
Albumin: 3.7 g/dL (ref 3.5–5.0)
Alkaline Phosphatase: 83 U/L (ref 38–126)
Anion gap: 10 (ref 5–15)
BUN: 21 mg/dL (ref 8–23)
CO2: 24 mmol/L (ref 22–32)
Calcium: 9 mg/dL (ref 8.9–10.3)
Chloride: 101 mmol/L (ref 98–111)
Creatinine, Ser: 0.74 mg/dL (ref 0.44–1.00)
GFR, Estimated: >60 mL/min (ref >60)
Glucose, Bld: 179 mg/dL — ABNORMAL HIGH (ref 70–99)
Potassium: 3.8 mmol/L (ref 3.5–5.1)
Sodium: 135 mmol/L (ref 135–145)
Total Bilirubin: 0.5 mg/dL (ref 0.3–1.2)
Total Protein: 7.2 g/dL (ref 6.5–8.1)

## 2020-08-28 LAB — URINALYSIS, DIPSTICK ONLY
Bilirubin Urine: NEGATIVE
Glucose, UA: NEGATIVE mg/dL
Hgb urine dipstick: NEGATIVE
Ketones, ur: NEGATIVE mg/dL
Nitrite: NEGATIVE
Protein, ur: NEGATIVE mg/dL
Specific Gravity, Urine: 1.018 (ref 1.005–1.030)
pH: 5 (ref 5.0–8.0)

## 2020-08-28 MED ORDER — SODIUM CHLORIDE 0.9 % IV SOLN
80.0000 mg/m2 | Freq: Once | INTRAVENOUS | Status: AC
  Administered 2020-08-28: 126 mg via INTRAVENOUS
  Filled 2020-08-28: qty 21

## 2020-08-28 MED ORDER — SODIUM CHLORIDE 0.9 % IV SOLN
10.0000 mg/kg | Freq: Once | INTRAVENOUS | Status: AC
  Administered 2020-08-28: 600 mg via INTRAVENOUS
  Filled 2020-08-28: qty 16

## 2020-08-28 MED ORDER — SODIUM CHLORIDE 0.9 % IV SOLN
Freq: Once | INTRAVENOUS | Status: AC
  Filled 2020-08-28: qty 250

## 2020-08-28 MED ORDER — HEPARIN SOD (PORK) LOCK FLUSH 100 UNIT/ML IV SOLN
500.0000 [IU] | Freq: Once | INTRAVENOUS | Status: AC | PRN
  Administered 2020-08-28: 500 [IU]
  Filled 2020-08-28: qty 5

## 2020-08-28 MED ORDER — SODIUM CHLORIDE 0.9 % IV SOLN
10.0000 mg | Freq: Once | INTRAVENOUS | Status: AC
  Administered 2020-08-28: 10 mg via INTRAVENOUS
  Filled 2020-08-28: qty 10

## 2020-08-28 MED ORDER — FAMOTIDINE IN NACL 20-0.9 MG/50ML-% IV SOLN
20.0000 mg | Freq: Once | INTRAVENOUS | Status: AC
  Administered 2020-08-28: 20 mg via INTRAVENOUS
  Filled 2020-08-28: qty 50

## 2020-08-28 MED ORDER — DIPHENHYDRAMINE HCL 50 MG/ML IJ SOLN
25.0000 mg | Freq: Once | INTRAMUSCULAR | Status: AC
  Administered 2020-08-28: 25 mg via INTRAVENOUS
  Filled 2020-08-28: qty 1

## 2020-08-28 MED ORDER — HEPARIN SOD (PORK) LOCK FLUSH 100 UNIT/ML IV SOLN
INTRAVENOUS | Status: AC
  Filled 2020-08-28: qty 5

## 2020-08-28 NOTE — Progress Notes (Signed)
Patient here today for follow up, treatment consideration regarding ovarian cancer. Patient denies any concerns today.

## 2020-08-28 NOTE — Progress Notes (Signed)
Gynecologic Oncology Inteval Visit   Referring Provider: Dr. Alain Marion  Chief Complaint: High grade serous ovarian cancer, advanced stage  Subjective:  Amanda Pena is a 65 y.o. P1 post menopausal female, initially seen in consultation from Dr. Alain Marion for adnexal masses with carcinomatosis, who returns to clinic for evaluation.   She is receiving weekly paclitaxel and bevacizumab therapy for platinum-resistant disease.    She received cycle 1 of weekly Taxol on days 1, 8, and 15 along with bevacizumab on days 1 and 15 at [facility].  Her first cycle was 06/19/2020. She saw Dr. Grayland Ormond today and plan to proceed with cycle 3, day 8 of treatment and plan to reimage with PET scan at the conclusion of cycle 3.    Ref Range & Units 7 d ago  (08/21/20) 2 wk ago  (08/14/20) 1 mo ago  (07/17/20)      Cancer Antigen (CA) 125 0.0 - 38.1 U/mL 261.0High  255.0High CM  239.0         Gynecologic Oncology History:  Amanda Pena is a pleasant P1 post menopausal female, initially seen in consultation from Dr. Alain Marion for adnexal masses with carcinomatosis, diagnosed with advanced high grade serous ovarian cancer. Her history is as follows:  In November 2020 she complained of severe lower quadrant pain with abdominal distention/bloating and saw her PCP who ordered CT scan 11/26, which showed heterogeneous soft tissue masses bilateral adnexa, measuring 4.3 x 3.6 cm on left, 4.6 x 2.7 cm on right. Diffuse omental soft tissue caking and diffuse peritoneal thickening are seen throughout the abdomen and pelvis with mild ascites. Findings consistent with peritoneal carcinomatosis.  Additionally, multiple pulmonary nodules in right lower lobe largest measuring 12 mm.   Menopause age 42.  No HRT. Last pap- 07/26/2017- NILM.  She complained of night sweats, intermittent constipation, and chronic back pain at initial visit. She takes multiple supplements.   06/16/19 - CT showed: heterogeneous  soft tissue masses bilateral adnexa, measuring 4.3 x 3.6 cm on left, 4.6 x 2.7 cm on right. Diffuse omental soft tissue caking and diffuse peritoneal thickening are seen throughout the abdomen and pelvis with mild ascites. Findings consistent with peritoneal carcinomatosis. Additionally, multiple pulmonary nodules in right lower lobe largest measuring 12 mm.   [id number] on 06/21/19  06/27/2019 CT Chest to complete staging showed multiple bilateral pulmonary nodules suspicious for metastatic disease larges measuring 1.0 cm in RLL. Soft tissue mass adjacent to GE junction suspicious for metastasis, right pleural effusion, right basilar atelectasis, ascites, CAD.   Omental mass was biopsied on 06/27/2019 which showed:  HIGH-GRADE SEROUS CARCINOMA OF GYNECOLOGIC ORIGIN.   In view of concern for lung metastases, she initiated carbo-taxol chemotherapy on 06/30/2019  Instead of undergoing primary debulking.  She received 3 cycles. After first cycle she experienced bone pain and bone scan was performed which was negative for osseous metastatic disease. Port was placed for chemotherapy for patient preference on 07/18/2019.   08/29/2019- CT Chest/Abdomen/Pelvis for restaging 1. Interval response to therapy, decrease in peritoneal carcinomatosis with decreased tumor volume within the omentum. Previously noted bilateral enlarged ovaries have decreased in size from previous exam. Thickened appendix identified along th eright pericolic gutter has decreased in caliber in the interval.  2. Interval improvement in previously noted right lower lobe pulmonary nodularity which is favored to represent sequelae of inflammatory or infectious process. A few persistent, stable milli metric lung nodules are nonspecific but likely represent a benign process. Attention on follow-up imaging advised.  CA -125 was elevated at time of diagnosis and has been followed:  06/21/2019 628 07/24/19  145 08/14/19 32.2  CEA was 1.2 on 06/21/2019.    CA125 normalized after three cycles of carboplatin/taxol. She had interval debulking surgery at [facility] 2/23 with TAH,BSO, omentectomy, appendectomy, rectosigmoid resection and reanastamosis, argon beam coagulation of diaphragm with no residual disease left behind.  She had an uneventful postoperative course.  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  09/12/2019 She had interval debulking surgery at [facility] with TAH,BSO, omentectomy, appendectomy, rectosigmoid resection and reanastamosis, argon beam coagulation of diaphragm with no residual disease left behind.  She had an uneventful postoperative course.  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    CT A/P 10/02/2019 IMPRESSION: 1. Interval hysterectomy and bilateral oophorectomy, omentectomy, and rectosigmoid resection for serous ovarian cancer. Small 14 mm fluid density structure in the left pelvis abutting the sigmoid colonic suture line has no surrounding inflammatory change or peripheral enhancement, and favors postoperative seroma. 2. Resection of previous omental nodularity. No evidence of new or recurrent disease. 3. Previous prominent appendix is not seen, question interval appendectomy. 4. Moderate volume of stool in the left colon and rectum, can be seen with constipation. No bowel obstruction or bowel inflammation. 5. Hepatic steatosis.  After her optimal debulking surgery she had 3 more cycles of carboplatin paclitaxel chemotherapy for a total of 6 treatments.  Last chemotherapy given on 11/17/2019.  She appeared to have progressive disease based on rising CA125 and possible nodularity on imaging.   CT A/P 12/01/2019 Lungs/Pleura: Right lower lobe nodules demonstrate continued improvement, largest measuring 4 mm on image 106/3. Additional scattered small nodules bilaterally are unchanged. There is a small perifissural nodule along the minor fissure on image 73/3.   Stable postsurgical changes within the anterior  abdominal wall. Low-density fluid collection to the left of the rectal anastomosis measures 2.2 cm on image 107/2, previously 1.4 cm. This demonstrates no definite solid components or surrounding inflammation. There is increased peritoneal nodularity superior to the transverse colon and adjacent to the pancreatic tail, most notable on sagittal images 78, 97 and 134 of series 6, suspicious for progressive peritoneal carcinomatosis. Stable mild nodular thickening in the subhepatic space, also best seen on the sagittal images.   IMPRESSION: 1. Increased peritoneal nodularity superior to the transverse colon and adjacent to the pancreatic tail suspicious for progressive peritoneal carcinomatosis. Stable nodular thickening in the subhepatic space. 2. Fluid collection to the left of the rectal anastomosis has mildly enlarged, although demonstrates no solid components. This is indeterminate. No generalized ascites.  3. No other evidence of metastatic disease within the chest, abdomen or pelvis. 4. Further improvement in previously demonstrated small ground-glass pulmonary opacities within the right lower lobe, likely inflammatory. Additional small pulmonary nodules are unchanged. 5. Large amount of stool throughout the colon consistent with constipation. 6. Aortic Atherosclerosis (ICD10-I70.0).  12/13/19 PET/CT Scan  1. Multiple hypermetabolic nodular and plaque-like foci of hypermetabolism throughout the peritoneal cavity with associated ill-defined regions of soft tissue attenuation on the CT images, compatible with metabolically active peritoneal carcinomatosis, most prominent in the perihepatic space as detailed. 2. No hypermetabolism associated with the small cystic focus at the left lateral margin of the colorectal anastomosis, considered benign. 3. No hypermetabolic extraperitoneal metastatic disease.   She opted for treatment on the GY009 trial and was randomized to Doxil and bevacizumab +/-  atezolizumab  12/27/2019 cycle #1 on Doxil 40 mg/m2 and bevacizumab 10 mg/kg (D1&15)  02/27/20: C3  Doxil 40 mg/m2 and bevacizumab 10 mg/kg (D1&15) + Neulasta  CT C/A/P with overall stable disease    03/26/20: C4 Doxil 40 mg/m2 and bevacizumab 10 mg/kg (D1&15) + Neulasta; CA 125 493  04/23/20: CT c/a/p: CT c/a/p 04/23/20: 1. Barely discernable peritoneal soft tissue implants are similar to prior, with persistent thickening/enhancement of the peritoneum. Small volume abdominopelvic ascites is increased. 2. New mild periportal edema is nonspecific. 3. Stable bilateral subcentimeter pulmonary nodules.   04/23/20: C5 Doxil 40 mg/m2 and bevacizumab 10 mg/kg (D1&15) + Neulasta; CA 125: 468 05/21/20: C6 Doxil 40 mg/m2 and bevacizumab 10 mg/kg (D1&15) + Neulasta; CA 125: 352  CT c/a/p 06/18/20: Impression: 1. Large volume ascites with peritoneal thickening concerning for metastatic disease. Confirmation can be obtained with ascitic fluid analysis. 2. Indeterminant enlarging left supraclavicular lymph node, recommend attention on follow-up. 3. New right lower lobe pulmonary nodule. 4. Enlarging retroperitoneal lymphadenopathy.  Recist read of the CT consistent with progression of disease given new pelvic adenopathy, increased ascites, and 5 mm right lower lobe nodule  CA125 [facility] at [facility]      Lab Results  Component Value Date   CA125 352.6 (H) 06/18/2020   CA125 375.9 (H) 05/21/2020   CA125 468.5 (H) 04/23/2020      Germline genetic testing negative.  HRD negative for somatic BRCA1/2 mutations. GIS negative.   Problem List: Patient Active Problem List   Diagnosis Date Noted  . COVID-19 vaccine series declined 05/28/2020  . Peritoneal carcinomatosis ([place]) 12/19/2019  . Genetic testing 11/16/2019  . Family history of breast cancer   . Family history of lung cancer   . Postoperative pain 10/04/2019  . Other constipation 10/04/2019  . Peritoneal carcinoma ([place]) 06/23/2019   . Malignant neoplasm of ovary ([place]) 06/23/2019  . Abdominal pain 06/13/2019  . LLQ abdominal pain 06/13/2019  . Calcific bursitis of shoulder 01/29/2016  . Cough 10/15/2014  . Cervical pain (neck) 10/15/2014  . Situational anxiety 03/07/2014  . Goals of care, counseling/discussion 02/10/2013  . 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 Preventative health care 11/09/2011  . INSOMNIA, CHRONIC 08/21/2010  . PERSONAL HISTORY MALIGNANT NEOPLASM THYROID 08/21/2010  . Hypothyroidism 04/09/2008  . Hyperlipidemia 04/09/2008  . Sinusitis 04/09/2008    Past Medical History: Past Medical History:  Diagnosis Date  . Cancer ([place])   . Family history of breast cancer   . Family history of lung cancer   . HYPERLIPIDEMIA 04/09/2008   Qualifier: Diagnosis of  By: Wynona Luna   . HYPOTHYROIDISM 04/09/2008   Qualifier: Diagnosis of  By: Wynona Luna   . INSOMNIA, CHRONIC 08/21/2010   Qualifier: Diagnosis of  By: Wynona Luna   . PERSONAL HISTORY MALIGNANT NEOPLASM THYROID 08/21/2010   Qualifier: Diagnosis of  By: Wynona Luna     Past Surgical History: Past Surgical History:  Procedure Laterality Date  . IR IMAGING GUIDED PORT INSERTION  07/18/2019  . THYROIDECTOMY, PARTIAL      Family History: Family History  Problem Relation Age of Onset  . Arthritis Mother   . Hyperlipidemia Mother   . Hyperlipidemia Father   . Diabetes Father   . Hypertension Father   . Heart disease Father 56       CAD  . Lung cancer Father   . Breast cancer Maternal Grandmother        in 74s  . Cancer Maternal Aunt        unk type  .  Cancer Maternal Uncle        unk type  . Cancer Paternal Uncle        unk type, d. 81s  . Skin cancer Daughter        basal cell     Social History: Social History   Socioeconomic History  . Marital status: Married    Spouse name: Not on file  . Number of children: Not on file  . Years of education: Not on file  . Highest education  level: Not on file  Occupational History  . Not on file  Tobacco Use  . Smoking status: Never Smoker  . Smokeless tobacco: Never Used  Vaping Use  . Vaping Use: Never used  Substance and Sexual Activity  . Alcohol use: Not Currently  . Drug use: No  . Sexual activity: Yes  Other Topics Concern  . 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 Moderna Sars-Covid-2 Vaccination 12/08/2019  . Td 10/06/2006   Allergies: No Known Allergies  Current Medications: Current Outpatient Medications  Medication Sig Dispense Refill  . ALPRAZolam (XANAX) 0.5 MG tablet Take 1 tablet (0.5 mg total) by mouth 2 (two) times daily as needed for anxiety. 60 tablet 2  . Artificial Tear Solution (20/20 ARTIFICIAL TEARS OP) Apply 2 drops to eye daily as needed.     . Ascorbic Acid (VITAMIN C) 1000 MG tablet Take 1,000 mg by mouth in the morning and at bedtime.     . Cholecalciferol (VITAMIN D3) 50 MCG (2000 UT) TABS Take 1 tablet by mouth in the morning and at bedtime.    . DULoxetine (CYMBALTA) 20 MG capsule Take 1 capsule (20 mg total) by mouth daily. 30 capsule 2  . lidocaine-prilocaine (EMLA) cream Apply generously to the port area 45-60 mins prior. 30 g 0  . MELATONIN PO Take 5 mg by mouth daily as needed.     . Omega-3 Fatty Acids (FISH OIL) 1000 MG CAPS Take 2 capsules by mouth daily.    . ondansetron (ZOFRAN) 8 MG tablet One pill every 8 hours as needed for nausea/vomitting. 40 tablet 1  . polyethylene glycol (MIRALAX / GLYCOLAX) 17 g  packet Take 17 g by mouth daily as needed.     . Probiotic Product (PROBIOTIC-10) CHEW Chew 1 capsule by mouth.    . prochlorperazine (COMPAZINE) 10 MG tablet Take 1 tablet (10 mg total) by mouth every 6 (six) hours as needed for nausea or vomiting. 40 tablet 1  . promethazine (PHENERGAN) 25 MG tablet Take 1 tablet (25 mg total) by mouth every 6 (six) hours as needed for nausea or vomiting. 30 tablet 0  . senna-docusate (SENOKOT-S) 8.6-50 MG tablet Take by mouth.    . SYNTHROID 75 MCG tablet TAKE 1 TABLET (75 MCG TOTAL) BY MOUTH DAILY BEFORE BREAKFAST. 90 tablet 3  . zolpidem (AMBIEN) 10 MG tablet TAKE 1 TABLET BY MOUTH EVERY DAY AT BEDTIME AS NEEDED FOR SLEEP 30 tablet 1   No current facility-administered medications for this  visit.   Review of Systems General:  weakness Skin: no complaints Eyes: no complaints HEENT: no complaints Breasts: no complaints Pulmonary: shortness of breath Cardiac: no complaints Gastrointestinal: abdominal bloating/distention, constipation Genitourinary/Sexual: no complaints Ob/Gyn: no complaints Musculoskeletal: back pain Hematology: no complaints Neurologic/Psych: numbness & tingling   Objective:  Physical Examination:  BP 110/74   Pulse 99   Temp 99.3 F (37.4 C) (Tympanic)   Wt 122 lb 8 oz (55.6 kg)   SpO2 100%   BMI 19.77 kg/m     ECOG Performance Status: 1 - Symptomatic but completely ambulatory  GENERAL: Patient is a well appearing female in no acute distress HEENT:  Atraumatic and normocephalic NODES:  No cervical, supraclavicular, axillary, or inguinal lymphadenopathy palpated.  LUNGS:  Clear to auscultation bilaterally HEART:  Regular rate and rhythm.  ABDOMEN:  Soft, nontender. Well healed midline surgical incision. Mildly rounded abdomen; question ascites MSK:  No focal spinal tenderness to palpation. Full range of motion bilaterally in the upper extremities. EXTREMITIES:  No peripheral edema.   SKIN:  Clear with no obvious  rashes or skin changes. NEURO:  Nonfocal. Well oriented.  Appropriate affect.  Pelvic: chaperoned by RN EGBUS: no lesions Cervix: absent Vagina: no lesions, no discharge or bleeding, vaginal cuff healing Uterus: absent Adnexa: absent BME: no palpable masses Rectovaginal: confirmatory   Radiologic Imaging: As noted per history.  CBC    Component Value Date/Time   WBC 4.7 08/28/2020 0924   RBC 3.43 (L) 08/28/2020 0924   HGB 10.5 (L) 08/28/2020 0924   HCT 31.4 (L) 08/28/2020 0924   PLT 206 08/28/2020 0924   MCV 91.5 08/28/2020 0924   MCH 30.6 08/28/2020 0924   MCHC 33.4 08/28/2020 0924   RDW 16.6 (H) 08/28/2020 0924   LYMPHSABS 1.1 08/28/2020 0924   MONOABS 0.2 08/28/2020 0924   EOSABS 0.1 08/28/2020 0924   BASOSABS 0.0 08/28/2020 0924   CMP Latest Ref Rng & Units 08/28/2020 08/21/2020 08/14/2020  Glucose 70 - 99 mg/dL 179(H) 132(H) 143(H)  BUN 8 - 23 mg/dL _0 Creatinine 0.44 - 1.00 mg/dL 0.74 0.82 0.77  Sodium 135 - 145 mmol/L 135 138 137  Potassium 3.5 - 5.1 mmol/L 3.8 4.1 4.1  Chloride 98 - 111 mmol/L 101 102 102  CO2 22 - 32 mmol/L _1 Calcium 8.9 - 10.3 mg/dL 9.0 9.5 9.0  Total Protein 6.5 - 8.1 g/dL 7.2 7.6 7.4  Total Bilirubin 0.3 - 1.2 mg/dL 0.5 0.8 0.6  Alkaline Phos 38 - 126 U/L 83 97 105  AST 15 - 41 U/L _2 ALT 0 - 44 U/L _3 Assessment:  Amanda Pena is a 65 y.o. female diagnosed with high grade serous cancer of ovarian/tubal origin.  Thought initially to have lung metastases and treated with three cycles of neoadjuvant chemotherapy. Subsequent  CT suggested the lung findings may not have been lung mets.  CA125 regressed to normal and CT scan 1/21 showed minimal residual disease.  Underwent interval debulking 2/23 and had residual disease that required laparotomy with TAH/BSO, omentectomy, debulking argon beam coagulation of right diaphragm, appendectomy and rectosigmoid resection and anastamosis.   No residual disease left at  the end of surgery. S/p 3 additional cycles of chemotherapy with evidence of rising CA125 and progression with platinum resistant high grade ovarian cancer.  PET CT completed 12/13/19 with evidence of carcinomatosis. 12/27/19 initiated treatment on GY009: Doxil and bevacizumab.  Progressed after cycle #6 currently on paclitaxel and bevacizumab. Tolerating therapy. Overall improved CA125 after one cycle of paclitaxel/bevacizumab ( 352.6 (H) to 239.0); now with a slight increase but overall stable. Agree with 3 cycles of therapy and then PET scan to assess response.   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   Germline MyRisk testing negative for mutations.  Somatic testing with [employer] somatic mutations for  BRCA1/2 negative. HRD - GIS negative.  Medical co-morbidities complicating care: not significant Plan:   Problem List Items Addressed This Visit      Endocrine   Malignant neoplasm of ovary ([place]) - Primary     I recommended continuation of therapy until disease progression. If she has stable disease or PR she can discuss the paclitaxel schedule with Dr. Grayland Ormond to have more flexibility and enhance QoL. We discussed alternative treatments and the Soraya trial results with mirvetuximab. Awaiting FDA approval and will need to send tissue to [facility] for FRalpha staining. I can also reassess clinical trial options for her. We hope to open a Zentalis trial with a WEE1 inhibitor but I do not know how long that will take to open.   I also answered her questions for her about her pathology, prior treatments, IO therapy and the previous trial she was on at [facility].   The patient's diagnosis and alternatives were discussed with her and her accompanying family members.  All questions were answered to their satisfaction.  A total of >45 minutes were spent with the patient/family today; >50% was spent in education, counseling and coordination of care for ovarian  cancer.  I personally had a face to face interaction and evaluated the patient jointly with the NP, Ms. Beckey Rutter.  I have reviewed her history and available records and have performed the key portions of the physical exam including ymph node survey, abdominal exam, pelvic exam with my findings confirming those documented above by the APP.  I have discussed the case with the APP and the patient.  I agree with the above documentation, assessment and plan which was fully formulated by me.  Counseling was completed by me.   I personally saw the patient and performed a substantive portion of this encounter in conjunction with the listed APP as documented above.   Ang[place] Gaetana Michaelis, MD

## 2020-08-29 LAB — CA 125: Cancer Antigen (CA) 125: 249 U/mL — ABNORMAL HIGH (ref 0.0–38.1)

## 2020-09-09 MED ORDER — SYNTHROID 75 MCG PO TABS: 75.0000 ug | ORAL_TABLET | Freq: Every day | ORAL | 0 refills | Status: AC

## 2020-09-10 ENCOUNTER — Encounter: Payer: Self-pay | Admitting: Nurse Practitioner

## 2020-09-10 ENCOUNTER — Encounter: Payer: Self-pay | Admitting: Oncology

## 2020-09-10 ENCOUNTER — Telehealth: Payer: Self-pay | Admitting: *Deleted

## 2020-09-10 NOTE — Telephone Encounter (Signed)
Spoke with patient via telephone. Patient sent mychart message to request for 09/12/20 pet scan results sooner than 09/17/20. Pt expressed emotional distress in waiting for the test results. I personally reached out to Dr. Grayland Ormond, who kindly agreed to sch a virtual visit on 09/12/20. Apt scheduled. Patient agreeable to mychart visit. I explained that we scheduled the apt later in the afternoon on 2/24 to allow plenty of time for her test results to be read. She was appreciative of the active listening by RN.

## 2020-09-11 ENCOUNTER — Other Ambulatory Visit: Payer: Self-pay

## 2020-09-11 ENCOUNTER — Ambulatory Visit: Payer: Self-pay | Admitting: Oncology

## 2020-09-11 ENCOUNTER — Ambulatory Visit: Payer: Self-pay

## 2020-09-12 ENCOUNTER — Other Ambulatory Visit: Payer: Self-pay

## 2020-09-12 ENCOUNTER — Inpatient Hospital Stay (HOSPITAL_BASED_OUTPATIENT_CLINIC_OR_DEPARTMENT_OTHER): Payer: Self-pay | Admitting: Oncology

## 2020-09-12 ENCOUNTER — Telehealth: Payer: Self-pay | Admitting: *Deleted

## 2020-09-12 ENCOUNTER — Ambulatory Visit
Admission: RE | Admit: 2020-09-12 | Discharge: 2020-09-12 | Disposition: A | Discharge status: Home / Self Care | Payer: Self-pay | Source: Ambulatory Visit | Attending: Oncology | Admitting: Oncology

## 2020-09-12 DIAGNOSIS — R140 Abdominal distension (gaseous): Secondary | ICD-10-CM | POA: Insufficient documentation

## 2020-09-12 DIAGNOSIS — R188 Other ascites: Secondary | ICD-10-CM | POA: Insufficient documentation

## 2020-09-12 DIAGNOSIS — C569 Malignant neoplasm of unspecified ovary: Secondary | ICD-10-CM | POA: Insufficient documentation

## 2020-09-12 DIAGNOSIS — I700 Atherosclerosis of aorta: Secondary | ICD-10-CM | POA: Insufficient documentation

## 2020-09-12 LAB — GLUCOSE, CAPILLARY: Glucose-Capillary: 64 mg/dL — ABNORMAL LOW (ref 70–99)

## 2020-09-12 MED ORDER — FLUDEOXYGLUCOSE F - 18 (FDG) INJECTION
6.3000 | Freq: Once | INTRAVENOUS | Status: AC | PRN
  Administered 2020-09-12: 6.513 via INTRAVENOUS

## 2020-09-12 NOTE — Telephone Encounter (Signed)
RN called and spoke with pt and explained that her scan results have not resulted, or been read and released yet.  Instructed pt that Dr Grayland Ormond has called and request a verbal report be called to him "stat" or as soon as possible.  Pt verbalized understanding and requested MD called pt with results rather doing video visit.  RN to inform MD.

## 2020-09-13 NOTE — Progress Notes (Signed)
[facility]  [phone] Fax:([phone]) [phone]  ID: Amanda Pena OB: [birth year]  MR#: [id number]  ESP#:[id number]  Patient Care Team: Cassandria Anger, MD as PCP - General (Internal Medicine) Arvella Nigh, MD as Consulting Physician (Obstetrics and Gynecology) Clent Jacks, RN as Oncology Nurse Navigator  I connected with [name] on 09/13/20 at  3:45 PM EST by telephone visit and verified that I am speaking with the correct person using two identifiers.   I discussed the limitations, risks, security and privacy concerns of performing an evaluation and management service by telemedicine and the availability of in-person appointments. I also discussed with the patient that there may be a patient responsible charge related to this service. The patient expressed understanding and agreed to proceed.   Other persons participating in the visit and their role in the encounter: Patient, MD.  Patient's location: Home. Provider's location: Clinic.  CHIEF COMPLAINT: Recurrent platinum resistant ovarian cancer.  INTERVAL HISTORY: Patient agreed to telephone visit as an add-on to discuss her PET scan results.  She currently feels well.  She does not complain of abdominal bloating or back pain today.  She is tolerating her treatments well without significant side effects.  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  She has no other neurologic complaints.  She denies any recent fevers or illnesses.  She has a good appetite denies weight loss.  She has no chest pain, shortness of breath, cough, or hemoptysis.  She denies any nausea, vomiting, constipation, or diarrhea.  She has no urinary complaints.  Patient offers no further specific complaints today.  REVIEW OF SYSTEMS:   Review of Systems  Constitutional: Negative.  Negative for fever, malaise/fatigue and weight loss.  Respiratory: Negative.  Negative for cough, hemoptysis and  shortness of breath.   Cardiovascular: Negative.  Negative for chest pain and leg swelling.  Gastrointestinal: Negative.  Negative for abdominal pain and nausea.  Genitourinary: Negative.  Negative for dysuria.  Musculoskeletal: Negative.  Negative for back pain.  Skin: Negative.  Negative for rash.  Neurological: Positive for sensory change. Negative for dizziness, focal weakness, weakness and headaches.  Psychiatric/Behavioral: Negative.  The patient is not nervous/anxious.     As per HPI. Otherwise, a complete review of systems is negative.  PAST MEDICAL HISTORY: Past Medical History:  Diagnosis Date  . Cancer ([place])   . Family history of breast cancer   . Family history of lung cancer   . HYPERLIPIDEMIA 04/09/2008   Qualifier: Diagnosis of  By: Wynona Luna   . HYPOTHYROIDISM 04/09/2008   Qualifier: Diagnosis of  By: Wynona Luna   . INSOMNIA, CHRONIC 08/21/2010   Qualifier: Diagnosis of  By: Wynona Luna   . PERSONAL HISTORY MALIGNANT NEOPLASM THYROID 08/21/2010   Qualifier: Diagnosis of  By: Wynona Luna     PAST SURGICAL HISTORY: Past Surgical History:  Procedure Laterality Date  . IR IMAGING GUIDED PORT INSERTION  07/18/2019  . THYROIDECTOMY, PARTIAL      FAMILY HISTORY: Family History  Problem Relation Age of Onset  . Arthritis Mother   . Hyperlipidemia Mother   . Hyperlipidemia Father   . Diabetes Father   . Hypertension Father   . Heart disease Father 9       CAD  . Lung cancer Father   . Breast cancer Maternal Grandmother        in 46s  . Cancer Maternal Aunt  unk type  . Cancer Maternal Uncle        unk type  . Cancer Paternal Uncle        unk type, d. 62s  . Skin cancer Daughter        basal cell     ADVANCED DIRECTIVES (Y/N):  N  HEALTH MAINTENANCE: Social History   Tobacco Use  . Smoking status: Never Smoker  . Smokeless tobacco: Never Used  Vaping Use  . Vaping Use: Never used  Substance Use Topics  . Alcohol  use: Not Currently  . Drug use: No     Colonoscopy:  PAP:  Bone density:  Lipid panel:  No Known Allergies  Current Outpatient Medications  Medication Sig Dispense Refill  . ALPRAZolam (XANAX) 0.5 MG tablet Take 1 tablet (0.5 mg total) by mouth 2 (two) times daily as needed for anxiety. 60 tablet 2  . Artificial Tear Solution (20/20 ARTIFICIAL TEARS OP) Apply 2 drops to eye daily as needed.     . Ascorbic Acid (VITAMIN C) 1000 MG tablet Take 1,000 mg by mouth in the morning and at bedtime.     . Cholecalciferol (VITAMIN D3) 50 MCG (2000 UT) TABS Take 1 tablet by mouth in the morning and at bedtime.    . DULoxetine (CYMBALTA) 20 MG capsule Take 1 capsule (20 mg total) by mouth daily. 30 capsule 2  . lidocaine-prilocaine (EMLA) cream Apply generously to the port area 45-60 mins prior. 30 g 0  . MELATONIN PO Take 5 mg by mouth daily as needed.     . Omega-3 Fatty Acids (FISH OIL) 1000 MG CAPS Take 2 capsules by mouth daily.    . ondansetron (ZOFRAN) 8 MG tablet One pill every 8 hours as needed for nausea/vomitting. 40 tablet 1  . polyethylene glycol (MIRALAX / GLYCOLAX) 17 g packet Take 17 g by mouth daily as needed.     . Probiotic Product (PROBIOTIC-10) CHEW Chew 1 capsule by mouth.    . prochlorperazine (COMPAZINE) 10 MG tablet Take 1 tablet (10 mg total) by mouth every 6 (six) hours as needed for nausea or vomiting. 40 tablet 1  . promethazine (PHENERGAN) 25 MG tablet Take 1 tablet (25 mg total) by mouth every 6 (six) hours as needed for nausea or vomiting. 30 tablet 0  . senna-docusate (SENOKOT-S) 8.6-50 MG tablet Take by mouth.    . SYNTHROID 75 MCG tablet Take 1 tablet (75 mcg total) by mouth daily before breakfast. 30 tablet 0  . zolpidem (AMBIEN) 10 MG tablet TAKE 1 TABLET BY MOUTH EVERY DAY AT BEDTIME AS NEEDED FOR SLEEP 30 tablet 1   No current facility-administered medications for this visit.    OBJECTIVE: There were no vitals filed for this visit.   There is no height or  weight on file to calculate BMI.    ECOG FS:1 - Symptomatic but completely ambulatory  LAB RESULTS:  Lab Results  Component Value Date   NA 135 08/28/2020   K 3.8 08/28/2020   CL 101 08/28/2020   CO2 24 08/28/2020   GLUCOSE 179 (H) 08/28/2020   BUN 21 08/28/2020   CREATININE 0.74 08/28/2020   CALCIUM 9.0 08/28/2020   PROT 7.2 08/28/2020   ALBUMIN 3.7 08/28/2020   AST 24 08/28/2020   ALT 18 08/28/2020   ALKPHOS 83 08/28/2020   BILITOT 0.5 08/28/2020   GFRNONAA >60 08/28/2020   GFRAA >60 01/29/2020    Lab Results  Component Value Date   WBC  4.7 08/28/2020   NEUTROABS 3.3 08/28/2020   HGB 10.5 (L) 08/28/2020   HCT 31.4 (L) 08/28/2020   MCV 91.5 08/28/2020   PLT 206 08/28/2020     STUDIES: NM PET Image Restag (PS) Skull Base To Thigh  Result Date: 09/12/2020 CLINICAL DATA:  Subsequent treatment strategy for ovarian cancer. EXAM: NUCLEAR MEDICINE PET SKULL BASE TO THIGH TECHNIQUE: 6.513 mCi F-18 FDG was injected intravenously. Full-ring PET imaging was performed from the skull base to thigh after the radiotracer. CT data was obtained and used for attenuation correction and anatomic localization. Fasting blood glucose: 64 mg/dl COMPARISON:  Multiple prior studies, most recent Dec 13, 2019 FINDINGS: Mediastinal blood pool activity: SUV max 2.44 Liver activity: SUV max NA NECK: LEFT level IV lymph node (image 56, series 3) new from the previous study measuring approximately 8 mm short axis with a maximum SUV of 9.7. This lymph node shows calcification. Incidental CT findings: none CHEST: RIGHT paratracheal lymph node (image 81, series 3) low level uptake maximum SUV of 3.7. Esophageal uptake, segmental along the distal esophagus maximum SUV approximately 5.8. Esophagus surrounded by ascites. This ascites is new and tracks from the abdomen into the chest. No additional areas of increased metabolic activity in the chest Incidental CT findings: Calcified coronary artery disease. Heart size  normal without pericardial effusion. The central pulmonary vasculature normal caliber. Limited assessment of cardiovascular structures given lack of intravenous contrast. RIGHT-sided Port-A-Cath terminates in the upper RIGHT atrium. No effusion. No consolidation. Tiny juxtapleural nodule on image 114 of series 3 approximately 2-3 mm. Airways are patent. ABDOMEN/PELVIS: Some areas of disease along the liver margin show diminished activity as compared to previous imaging but there is increased activity in the inter lobar fissure along the false form ligament were ascites tracks into the ligament. (Image 137, series 3) maximum SUV of 8. Area expanding the inter lobar fissure measuring approximately 2.4 x 1.8 cm. Activity along the caudate margin and inferior RIGHT hepatic lobe with decreased size but with similar level of activity with a maximum SUV of approximately 6.3 as compared to 6.1 on the prior study on image 134 series 3. Areas along the anterior surface of the liver do not show the same level of uptake. Interval development of retroperitoneal adenopathy showing calcification and hypermetabolic features. Celiac lymph node on image 137 of series 3 measuring 0.9 cm with a maximum SUV of 6.2 Intra-aortocaval lymph node on image 152 of series 3 with a maximum SUV of 7.9 size slightly less than a cm. 0.9 cm lymph node on image 168 of series 3 with a maximum SUV of 13.1 Common iliac lymph node/low intra-aortocaval lymph nodes on image 175 through image 180 of series 3 with a maximum SUV of 13.2 RIGHT pelvic sidewall lymph node 1 cm on image 213 of series 3 with a maximum SUV of 5.3 Many of the plaque-like areas of uptake along the peritoneal surface are not as apparent including areas along the under surface of the LEFT hepatic lobe but there is generalized moderately large volume of ascites that has developed since the previous study. Cystic area adjacent to a rectal anastomosis in the pelvis is stable measuring  approximately 2 cm greatest axial dimension showing no increased metabolic activity. Incidental CT findings: Increase in ascites as described. Scalloping of the liver margin. Sludge in the gallbladder. Pancreas is unremarkable. Spleen, adrenal glands and kidneys without acute process. Mildly dilated central abdominal small bowel loops suspended in ascites. These are fluid-filled. Stool  and gas seen throughout the colon. No pericolonic stranding. Atheromatous plaque in the abdominal aorta. SKELETON: No focal hypermetabolic activity to suggest skeletal metastasis. Incidental CT findings: none IMPRESSION: 1. Interval development of nodal disease in the abdomen and pelvis, extension of nodal disease to the LEFT thoracic inlet and possible mediastinal nodal disease as described. 2. Large volume ascites which is developed in the interval. 3. Some areas of serosal and peritoneal implants have resolved in terms of FDG uptake though with new areas, particularly in the inter lobar fissure between medial and lateral segment of the LEFT hepatic lobe. 4. Ascites scallops the liver margins and extends into the chest. 5. Segmental esophageal uptake may represent implants extending to the esophagus as there is ascites which tracks through the esophageal hiatus into the chest or esophagitis, attention on follow-up and correlation with any symptoms. 6. Mild generalized bowel distension in the central abdomen without overt signs of obstruction. Correlate with any new abdominal symptoms and attention on follow-up as the patient may be at risk for developing malignant adhesions. Aortic Atherosclerosis (ICD10-I70.0). Electronically Signed   By: Zetta Bills M.D.   On: 09/12/2020 16:47    ASSESSMENT:  Recurrent platinum resistant ovarian cancer.  PLAN:   1.  Recurrent platinum resistant ovarian cancer: Patient's CA-125 at [facility] prior to initiating treatment was 352.6.  She initially trended down to 239, but then trended  up slightly to 261.  CA-125 is now trending back down to 49.0.  CT scan results from June 18, 2020 reviewed independently revealing large volume ascites with peritoneal thickening and enlarging retroperitoneal lymphadenopathy concerning for progressive disease.  PET scan results from earlier today, September 12, 2020 reviewed independently and report as above with progression of disease since May 2021, but appears to be stable since November 2021.  Will further discuss with gynecology oncology. Previously patient has been treated with surgery, carboplatinum and Taxol, liposomal doxorubicin with Avastin plus or minus atezolizumab on clinical trial GY009.  She received cycle 1 of weekly Taxol on days 1, 8, and 15 along with Avastin on days 1 and 15 at [facility].  Return to clinic next week as previously scheduled for further evaluation and consideration of cycle 4, day 1.  Appreciate gynecology oncology input 2.  Neuropathy: Chronic and unchanged. Patient admits to occasional balance issues and problems sleeping.  Continue Cymbalta.  Consider occupational therapy referral in the near future. 3.  Anemia: Hemoglobin slightly worse today at 10.5.  Monitor. 4.  Insomnia: Chronic and unchanged.  Continue Ambien as needed. 5.  Abdominal bloating: Patient does not complain of this today.  We previously discussed the possibility of a paracentesis, but patient declined.  I provided 22 minutes of non face-to-face telephone visit time during this encounter which included chart review, counseling, and coordination of care as documented above.   Patient expressed understanding and was in agreement with this plan. She also understands that She can call clinic at any time with any questions, concerns, or complaints.   Cancer Staging Malignant neoplasm of ovary ([place]) Staging form: Ovary, Fallopian Tube, and Primary Peritoneal Carcinoma, AJCC 8th Edition - Clinical: FIGO Stage IVA (pM1a) - Signed by Lloyd Huger, MD on 07/17/2020   Lloyd Huger, MD   09/13/2020 6:46 AM

## 2020-09-14 NOTE — Progress Notes (Signed)
[place]  [phone]) [phone] Fax:(336) [phone]  ID: Amanda Pena OB: 17-Jun-1956  MR#: [id number]  XLK#:[id number]  Patient Care Team: Cassandria Anger, MD as PCP - General (Internal Medicine) Arvella Nigh, MD as Consulting Physician (Obstetrics and Gynecology) Clent Jacks, RN as Oncology Nurse Navigator  CHIEF COMPLAINT: Recurrent platinum resistant ovarian cancer.  INTERVAL HISTORY: Patient returns to clinic today for further evaluation and consideration of cycle 4, day 1 of Taxol and Avastin. She continues to have mild abdominal bloating, but otherwise feels well. Her peripheral neuropathy is unchanged. She is tolerating her treatments well without significant side effects. She has no other neurologic complaints.  She denies any recent fevers or illnesses.  She has a good appetite denies weight loss.  She has no chest pain, shortness of breath, cough, or hemoptysis.  She denies any nausea, vomiting, constipation, or diarrhea.  She has no urinary complaints. Patient offers no further specific complaints today.  REVIEW OF SYSTEMS:   Review of Systems  Constitutional: Negative.  Negative for fever, malaise/fatigue and weight loss.  Respiratory: Negative.  Negative for cough, hemoptysis and shortness of breath.   Cardiovascular: Negative.  Negative for chest pain and leg swelling.  Gastrointestinal: Negative.  Negative for abdominal pain and nausea.  Genitourinary: Negative.  Negative for dysuria.  Musculoskeletal: Negative.  Negative for back pain.  Skin: Negative.  Negative for rash.  Neurological: Positive for sensory change. Negative for dizziness, focal weakness, weakness and headaches.  Psychiatric/Behavioral: Negative.  The patient is not nervous/anxious.     As per HPI. Otherwise, a complete review of systems is negative.  PAST MEDICAL HISTORY: Past Medical History:  Diagnosis Date  . Cancer ([place])   . Family history of breast cancer    . Family history of lung cancer   . HYPERLIPIDEMIA 04/09/2008   Qualifier: Diagnosis of  By: Wynona Luna   . HYPOTHYROIDISM 04/09/2008   Qualifier: Diagnosis of  By: Wynona Luna   . INSOMNIA, CHRONIC 08/21/2010   Qualifier: Diagnosis of  By: Wynona Luna   . PERSONAL HISTORY MALIGNANT NEOPLASM THYROID 08/21/2010   Qualifier: Diagnosis of  By: Wynona Luna     PAST SURGICAL HISTORY: Past Surgical History:  Procedure Laterality Date  . IR IMAGING GUIDED PORT INSERTION  07/18/2019  . THYROIDECTOMY, PARTIAL      FAMILY HISTORY: Family History  Problem Relation Age of Onset  . Arthritis Mother   . Hyperlipidemia Mother   . Hyperlipidemia Father   . Diabetes Father   . Hypertension Father   . Heart disease Father 42       CAD  . Lung cancer Father   . Breast cancer Maternal Grandmother        in 3s  . Cancer Maternal Aunt        unk type  . Cancer Maternal Uncle        unk type  . Cancer Paternal Uncle        unk type, d. 9s  . Skin cancer Daughter        basal cell     ADVANCED DIRECTIVES (Y/N):  N  HEALTH MAINTENANCE: Social History   Tobacco Use  . Smoking status: Never Smoker  . Smokeless tobacco: Never Used  Vaping Use  . Vaping Use: Never used  Substance Use Topics  . Alcohol use: Not Currently  . Drug use: No     Colonoscopy:  PAP:  Bone density:  Lipid panel:  No Known Allergies  Current Outpatient Medications  Medication Sig Dispense Refill  . ALPRAZolam (XANAX) 0.5 MG tablet Take 1 tablet (0.5 mg total) by mouth 2 (two) times daily as needed for anxiety. 60 tablet 2  . Artificial Tear Solution (20/20 ARTIFICIAL TEARS OP) Apply 2 drops to eye daily as needed.     . Ascorbic Acid (VITAMIN C) 1000 MG tablet Take 1,000 mg by mouth in the morning and at bedtime.     . Cholecalciferol (VITAMIN D3) 50 MCG (2000 UT) TABS Take 1 tablet by mouth in the morning and at bedtime.    . gabapentin (NEURONTIN) 300 MG capsule Take 300 mg by  mouth at bedtime.    . lidocaine-prilocaine (EMLA) cream Apply generously to the port area 45-60 mins prior. 30 g 0  . MELATONIN PO Take 5 mg by mouth daily as needed.     . Omega-3 Fatty Acids (FISH OIL) 1000 MG CAPS Take 2 capsules by mouth daily.    . ondansetron (ZOFRAN) 8 MG tablet One pill every 8 hours as needed for nausea/vomitting. 40 tablet 1  . polyethylene glycol (MIRALAX / GLYCOLAX) 17 g packet Take 17 g by mouth daily as needed.     . Probiotic Product (PROBIOTIC-10) CHEW Chew 1 capsule by mouth.    . prochlorperazine (COMPAZINE) 10 MG tablet Take 1 tablet (10 mg total) by mouth every 6 (six) hours as needed for nausea or vomiting. 40 tablet 1  . promethazine (PHENERGAN) 25 MG tablet Take 1 tablet (25 mg total) by mouth every 6 (six) hours as needed for nausea or vomiting. 30 tablet 0  . senna-docusate (SENOKOT-S) 8.6-50 MG tablet Take by mouth.    . SYNTHROID 75 MCG tablet Take 1 tablet (75 mcg total) by mouth daily before breakfast. 30 tablet 0  . zolpidem (AMBIEN) 10 MG tablet TAKE 1 TABLET BY MOUTH EVERY DAY AT BEDTIME AS NEEDED FOR SLEEP 30 tablet 1  . DULoxetine (CYMBALTA) 20 MG capsule Take 1 capsule (20 mg total) by mouth daily. (Patient not taking: Reported on 09/17/2020) 30 capsule 2   No current facility-administered medications for this visit.    OBJECTIVE: Vitals:   09/17/20 0929  BP: 135/76  Pulse: 87  Temp: 99.1 F (37.3 C)  SpO2: 100%     Body mass index is 19.93 kg/m.    ECOG FS:1 - Symptomatic but completely ambulatory   General: Well-developed, well-nourished, no acute distress. Eyes: Pink conjunctiva, anicteric sclera. HEENT: Normocephalic, moist mucous membranes. Lungs: No audible wheezing or coughing. Heart: Regular rate and rhythm. Abdomen: Soft, nontender, no obvious distention. Musculoskeletal: No edema, cyanosis, or clubbing. Neuro: Alert, answering all questions appropriately. Cranial nerves grossly intact. Skin: No rashes or petechiae  noted. Psych: Normal affect.   LAB RESULTS:  Lab Results  Component Value Date   NA 139 09/17/2020   K 4.3 09/17/2020   CL 103 09/17/2020   CO2 26 09/17/2020   GLUCOSE 104 (H) 09/17/2020   BUN 14 09/17/2020   CREATININE 0.64 09/17/2020   CALCIUM 9.2 09/17/2020   PROT 7.1 09/17/2020   ALBUMIN 3.6 09/17/2020   AST 21 09/17/2020   ALT 18 09/17/2020   ALKPHOS 106 09/17/2020   BILITOT 0.6 09/17/2020   GFRNONAA >60 09/17/2020   GFRAA >60 01/29/2020    Lab Results  Component Value Date   WBC 5.3 09/17/2020   NEUTROABS 3.6 09/17/2020   HGB 11.2 (L) 09/17/2020   HCT  35.2 (L) 09/17/2020   MCV 94.9 09/17/2020   PLT 213 09/17/2020     STUDIES: NM PET Image Restag (PS) Skull Base To Thigh  Result Date: 09/12/2020 CLINICAL DATA:  Subsequent treatment strategy for ovarian cancer. EXAM: NUCLEAR MEDICINE PET SKULL BASE TO THIGH TECHNIQUE: 6.513 mCi F-18 FDG was injected intravenously. Full-ring PET imaging was performed from the skull base to thigh after the radiotracer. CT data was obtained and used for attenuation correction and anatomic localization. Fasting blood glucose: 64 mg/dl COMPARISON:  Multiple prior studies, most recent Dec 13, 2019 FINDINGS: Mediastinal blood pool activity: SUV max 2.44 Liver activity: SUV max NA NECK: LEFT level IV lymph node (image 56, series 3) new from the previous study measuring approximately 8 mm short axis with a maximum SUV of 9.7. This lymph node shows calcification. Incidental CT findings: none CHEST: RIGHT paratracheal lymph node (image 81, series 3) low level uptake maximum SUV of 3.7. Esophageal uptake, segmental along the distal esophagus maximum SUV approximately 5.8. Esophagus surrounded by ascites. This ascites is new and tracks from the abdomen into the chest. No additional areas of increased metabolic activity in the chest Incidental CT findings: Calcified coronary artery disease. Heart size normal without pericardial effusion. The central  pulmonary vasculature normal caliber. Limited assessment of cardiovascular structures given lack of intravenous contrast. RIGHT-sided Port-A-Cath terminates in the upper RIGHT atrium. No effusion. No consolidation. Tiny juxtapleural nodule on image 114 of series 3 approximately 2-3 mm. Airways are patent. ABDOMEN/PELVIS: Some areas of disease along the liver margin show diminished activity as compared to previous imaging but there is increased activity in the inter lobar fissure along the false form ligament were ascites tracks into the ligament. (Image 137, series 3) maximum SUV of 8. Area expanding the inter lobar fissure measuring approximately 2.4 x 1.8 cm. Activity along the caudate margin and inferior RIGHT hepatic lobe with decreased size but with similar level of activity with a maximum SUV of approximately 6.3 as compared to 6.1 on the prior study on image 134 series 3. Areas along the anterior surface of the liver do not show the same level of uptake. Interval development of retroperitoneal adenopathy showing calcification and hypermetabolic features. Celiac lymph node on image 137 of series 3 measuring 0.9 cm with a maximum SUV of 6.2 Intra-aortocaval lymph node on image 152 of series 3 with a maximum SUV of 7.9 size slightly less than a cm. 0.9 cm lymph node on image 168 of series 3 with a maximum SUV of 13.1 Common iliac lymph node/low intra-aortocaval lymph nodes on image 175 through image 180 of series 3 with a maximum SUV of 13.2 RIGHT pelvic sidewall lymph node 1 cm on image 213 of series 3 with a maximum SUV of 5.3 Many of the plaque-like areas of uptake along the peritoneal surface are not as apparent including areas along the under surface of the LEFT hepatic lobe but there is generalized moderately large volume of ascites that has developed since the previous study. Cystic area adjacent to a rectal anastomosis in the pelvis is stable measuring approximately 2 cm greatest axial dimension showing  no increased metabolic activity. Incidental CT findings: Increase in ascites as described. Scalloping of the liver margin. Sludge in the gallbladder. Pancreas is unremarkable. Spleen, adrenal glands and kidneys without acute process. Mildly dilated central abdominal small bowel loops suspended in ascites. These are fluid-filled. Stool and gas seen throughout the colon. No pericolonic stranding. Atheromatous plaque in the abdominal aorta. SKELETON:  No focal hypermetabolic activity to suggest skeletal metastasis. Incidental CT findings: none IMPRESSION: 1. Interval development of nodal disease in the abdomen and pelvis, extension of nodal disease to the LEFT thoracic inlet and possible mediastinal nodal disease as described. 2. Large volume ascites which is developed in the interval. 3. Some areas of serosal and peritoneal implants have resolved in terms of FDG uptake though with new areas, particularly in the inter lobar fissure between medial and lateral segment of the LEFT hepatic lobe. 4. Ascites scallops the liver margins and extends into the chest. 5. Segmental esophageal uptake may represent implants extending to the esophagus as there is ascites which tracks through the esophageal hiatus into the chest or esophagitis, attention on follow-up and correlation with any symptoms. 6. Mild generalized bowel distension in the central abdomen without overt signs of obstruction. Correlate with any new abdominal symptoms and attention on follow-up as the patient may be at risk for developing malignant adhesions. Aortic Atherosclerosis (ICD10-I70.0). Electronically Signed   By: Zetta Bills M.D.   On: 09/12/2020 16:47    ASSESSMENT:  Recurrent platinum resistant ovarian cancer.  PLAN:   1.  Recurrent platinum resistant ovarian cancer: Patient's CA-125 at [facility] prior to initiating treatment was 352.6. Her tumor marker is now ranged between 239 and 268 since July 17, 2020.  CT scan results from  June 18, 2020 reviewed independently revealing large volume ascites with peritoneal thickening and enlarging retroperitoneal lymphadenopathy concerning for progressive disease.  PET scan results from September 12, 2020 reviewed independently and reported as above with progression of disease since May 2021, but appears to be stable since November 2021.  Will further discuss with gynecology oncology. Previously patient has been treated with surgery, carboplatinum and Taxol, liposomal doxorubicin with Avastin plus or minus atezolizumab on clinical trial GY009.  She received cycle 1 of weekly Taxol on days 1, 8, and 15 along with Avastin on days 1 and 15 at [facility]. After lengthy discussion with the patient and her daughter, it was agreed upon that despite the mild progression of disease there is not enough to change treatment at this time and will proceed with cycle 4, day 1 of Taxol and Avastin. Plan to repeat PET after cycle 6. Return to clinic in 1 week for further evaluation and consideration of cycle 4, day 8.  2.  Neuropathy: Chronic and unchanged. Patient admits to occasional balance issues and problems sleeping. She has discontinued Cymbalta and is now back on gabapentin. She is also requested an appointment for acupuncture.  3.  Anemia: Chronic and unchanged. Patient's hemoglobin is 11.2 today. 4.  Insomnia: Chronic and unchanged.  Continue Ambien as needed. 5.  Abdominal bloating: Patient is now requested ultrasound-guided paracentesis.  Patient expressed understanding and was in agreement with this plan. She also understands that She can call clinic at any time with any questions, concerns, or complaints.   Cancer Staging Malignant neoplasm of ovary ([place]) Staging form: Ovary, Fallopian Tube, and Primary Peritoneal Carcinoma, AJCC 8th Edition - Clinical: FIGO Stage IVA (pM1a) - Signed by Lloyd Huger, MD on 07/17/2020   Lloyd Huger, MD   09/18/2020 5:50 AM

## 2020-09-17 ENCOUNTER — Telehealth: Payer: Self-pay

## 2020-09-17 ENCOUNTER — Encounter: Payer: Self-pay | Admitting: Oncology

## 2020-09-17 ENCOUNTER — Inpatient Hospital Stay: Payer: Self-pay

## 2020-09-17 ENCOUNTER — Inpatient Hospital Stay: Payer: Self-pay | Attending: Oncology

## 2020-09-17 ENCOUNTER — Inpatient Hospital Stay (HOSPITAL_BASED_OUTPATIENT_CLINIC_OR_DEPARTMENT_OTHER): Payer: Self-pay | Admitting: Oncology

## 2020-09-17 VITALS — BP 135/76 | HR 87 | Temp 99.1°F | Wt 123.5 lb

## 2020-09-17 DIAGNOSIS — G4700 Insomnia, unspecified: Secondary | ICD-10-CM | POA: Insufficient documentation

## 2020-09-17 DIAGNOSIS — R180 Malignant ascites: Secondary | ICD-10-CM

## 2020-09-17 DIAGNOSIS — E039 Hypothyroidism, unspecified: Secondary | ICD-10-CM | POA: Insufficient documentation

## 2020-09-17 DIAGNOSIS — R188 Other ascites: Secondary | ICD-10-CM | POA: Insufficient documentation

## 2020-09-17 DIAGNOSIS — Z803 Family history of malignant neoplasm of breast: Secondary | ICD-10-CM | POA: Insufficient documentation

## 2020-09-17 DIAGNOSIS — C569 Malignant neoplasm of unspecified ovary: Secondary | ICD-10-CM

## 2020-09-17 DIAGNOSIS — Z8249 Family history of ischemic heart disease and other diseases of the circulatory system: Secondary | ICD-10-CM | POA: Insufficient documentation

## 2020-09-17 DIAGNOSIS — Z8261 Family history of arthritis: Secondary | ICD-10-CM | POA: Insufficient documentation

## 2020-09-17 DIAGNOSIS — C563 Malignant neoplasm of bilateral ovaries: Secondary | ICD-10-CM | POA: Insufficient documentation

## 2020-09-17 DIAGNOSIS — C786 Secondary malignant neoplasm of retroperitoneum and peritoneum: Secondary | ICD-10-CM

## 2020-09-17 DIAGNOSIS — Z5111 Encounter for antineoplastic chemotherapy: Secondary | ICD-10-CM | POA: Insufficient documentation

## 2020-09-17 DIAGNOSIS — Z79899 Other long term (current) drug therapy: Secondary | ICD-10-CM | POA: Insufficient documentation

## 2020-09-17 DIAGNOSIS — Z801 Family history of malignant neoplasm of trachea, bronchus and lung: Secondary | ICD-10-CM | POA: Insufficient documentation

## 2020-09-17 DIAGNOSIS — Z8349 Family history of other endocrine, nutritional and metabolic diseases: Secondary | ICD-10-CM | POA: Insufficient documentation

## 2020-09-17 DIAGNOSIS — Z833 Family history of diabetes mellitus: Secondary | ICD-10-CM | POA: Insufficient documentation

## 2020-09-17 DIAGNOSIS — C482 Malignant neoplasm of peritoneum, unspecified: Secondary | ICD-10-CM

## 2020-09-17 DIAGNOSIS — Z5112 Encounter for antineoplastic immunotherapy: Secondary | ICD-10-CM | POA: Insufficient documentation

## 2020-09-17 DIAGNOSIS — G629 Polyneuropathy, unspecified: Secondary | ICD-10-CM | POA: Insufficient documentation

## 2020-09-17 DIAGNOSIS — D649 Anemia, unspecified: Secondary | ICD-10-CM | POA: Insufficient documentation

## 2020-09-17 DIAGNOSIS — Z85850 Personal history of malignant neoplasm of thyroid: Secondary | ICD-10-CM | POA: Insufficient documentation

## 2020-09-17 LAB — COMPREHENSIVE METABOLIC PANEL
ALT: 18 U/L (ref 0–44)
AST: 21 U/L (ref 15–41)
Albumin: 3.6 g/dL (ref 3.5–5.0)
Alkaline Phosphatase: 106 U/L (ref 38–126)
Anion gap: 10 (ref 5–15)
BUN: 14 mg/dL (ref 8–23)
CO2: 26 mmol/L (ref 22–32)
Calcium: 9.2 mg/dL (ref 8.9–10.3)
Chloride: 103 mmol/L (ref 98–111)
Creatinine, Ser: 0.64 mg/dL (ref 0.44–1.00)
GFR, Estimated: >60 mL/min (ref >60)
Glucose, Bld: 104 mg/dL — ABNORMAL HIGH (ref 70–99)
Potassium: 4.3 mmol/L (ref 3.5–5.1)
Sodium: 139 mmol/L (ref 135–145)
Total Bilirubin: 0.6 mg/dL (ref 0.3–1.2)
Total Protein: 7.1 g/dL (ref 6.5–8.1)

## 2020-09-17 LAB — CBC WITH DIFFERENTIAL/PLATELET
Abs Immature Granulocytes: 0.01 10*3/uL (ref 0.00–0.07)
Basophils Absolute: 0 10*3/uL (ref 0.0–0.1)
Basophils Relative: 0 %
Eosinophils Absolute: 0 10*3/uL (ref 0.0–0.5)
Eosinophils Relative: 1 %
HCT: 35.2 % — ABNORMAL LOW (ref 36.0–46.0)
Hemoglobin: 11.2 g/dL — ABNORMAL LOW (ref 12.0–15.0)
Immature Granulocytes: 0 %
Lymphocytes Relative: 24 %
Lymphs Abs: 1.3 10*3/uL (ref 0.7–4.0)
MCH: 30.2 pg (ref 26.0–34.0)
MCHC: 31.8 g/dL (ref 30.0–36.0)
MCV: 94.9 fL (ref 80.0–100.0)
Monocytes Absolute: 0.4 10*3/uL (ref 0.1–1.0)
Monocytes Relative: 8 %
Neutro Abs: 3.6 10*3/uL (ref 1.7–7.7)
Neutrophils Relative %: 67 %
Platelets: 213 10*3/uL (ref 150–400)
RBC: 3.71 MIL/uL — ABNORMAL LOW (ref 3.87–5.11)
RDW: 16.7 % — ABNORMAL HIGH (ref 11.5–15.5)
WBC: 5.3 10*3/uL (ref 4.0–10.5)
nRBC: 0 % (ref 0.0–0.2)

## 2020-09-17 LAB — URINALYSIS, DIPSTICK ONLY
Bilirubin Urine: NEGATIVE
Glucose, UA: NEGATIVE mg/dL
Hgb urine dipstick: NEGATIVE
Ketones, ur: NEGATIVE mg/dL
Nitrite: NEGATIVE
Protein, ur: NEGATIVE mg/dL
Specific Gravity, Urine: 1.006 (ref 1.005–1.030)
pH: 5 (ref 5.0–8.0)

## 2020-09-17 MED ORDER — HEPARIN SOD (PORK) LOCK FLUSH 100 UNIT/ML IV SOLN
INTRAVENOUS | Status: AC
  Filled 2020-09-17: qty 5

## 2020-09-17 MED ORDER — DIPHENHYDRAMINE HCL 50 MG/ML IJ SOLN
25.0000 mg | Freq: Once | INTRAMUSCULAR | Status: AC
  Administered 2020-09-17: 25 mg via INTRAVENOUS
  Filled 2020-09-17: qty 1

## 2020-09-17 MED ORDER — FAMOTIDINE IN NACL 20-0.9 MG/50ML-% IV SOLN
20.0000 mg | Freq: Once | INTRAVENOUS | Status: AC
  Administered 2020-09-17: 20 mg via INTRAVENOUS
  Filled 2020-09-17: qty 50

## 2020-09-17 MED ORDER — HEPARIN SOD (PORK) LOCK FLUSH 100 UNIT/ML IV SOLN
500.0000 [IU] | Freq: Once | INTRAVENOUS | Status: AC
  Administered 2020-09-17: 500 [IU] via INTRAVENOUS
  Filled 2020-09-17: qty 5

## 2020-09-17 MED ORDER — HEPARIN SOD (PORK) LOCK FLUSH 100 UNIT/ML IV SOLN
500.0000 [IU] | Freq: Once | INTRAVENOUS | Status: DC | PRN
  Filled 2020-09-17: qty 5

## 2020-09-17 MED ORDER — SODIUM CHLORIDE 0.9% FLUSH
10.0000 mL | Freq: Once | INTRAVENOUS | Status: AC
  Administered 2020-09-17: 10 mL via INTRAVENOUS
  Filled 2020-09-17: qty 10

## 2020-09-17 MED ORDER — SODIUM CHLORIDE 0.9 % IV SOLN
10.0000 mg/kg | Freq: Once | INTRAVENOUS | Status: AC
  Administered 2020-09-17: 600 mg via INTRAVENOUS
  Filled 2020-09-17: qty 16

## 2020-09-17 MED ORDER — SODIUM CHLORIDE 0.9 % IV SOLN
80.0000 mg/m2 | Freq: Once | INTRAVENOUS | Status: AC
  Administered 2020-09-17: 126 mg via INTRAVENOUS
  Filled 2020-09-17: qty 21

## 2020-09-17 MED ORDER — SODIUM CHLORIDE 0.9 % IV SOLN
Freq: Once | INTRAVENOUS | Status: AC
  Filled 2020-09-17: qty 250

## 2020-09-17 MED ORDER — SODIUM CHLORIDE 0.9 % IV SOLN
10.0000 mg | Freq: Once | INTRAVENOUS | Status: AC
  Administered 2020-09-17: 10 mg via INTRAVENOUS
  Filled 2020-09-17: qty 10

## 2020-09-17 NOTE — Progress Notes (Signed)
Patient here for oncology follow-up appointment, expresses concerns of back pain, cough and constipation, requests thyroid test today.

## 2020-09-18 LAB — THYROID PANEL WITH TSH
Free Thyroxine Index: 2.3 (ref 1.2–4.9)
T3 Uptake Ratio: 25 % (ref 24–39)
T4, Total: 9.2 ug/dL (ref 4.5–12.0)
TSH: 1.75 u[IU]/mL (ref 0.450–4.500)

## 2020-09-18 LAB — CA 125: Cancer Antigen (CA) 125: 268 U/mL — ABNORMAL HIGH (ref 0.0–38.1)

## 2020-09-20 ENCOUNTER — Ambulatory Visit
Admission: RE | Admit: 2020-09-20 | Discharge: 2020-09-20 | Disposition: A | Discharge status: Home / Self Care | Payer: Self-pay | Source: Ambulatory Visit | Attending: Oncology | Admitting: Oncology

## 2020-09-20 ENCOUNTER — Other Ambulatory Visit: Payer: Self-pay

## 2020-09-20 DIAGNOSIS — R180 Malignant ascites: Secondary | ICD-10-CM | POA: Insufficient documentation

## 2020-09-20 NOTE — Procedures (Signed)
PROCEDURE SUMMARY:  Successful US guided paracentesis from right lateral abdomen.  Yielded 2.6 liters of hazy yellow fluid.  No immediate complications.  Patient tolerated well.  EBL = trace  WENDY S BLAIR PA-C 09/20/2020 9:34 AM

## 2020-09-21 ENCOUNTER — Telehealth: Payer: Self-pay | Admitting: Interventional Radiology

## 2020-09-21 NOTE — Progress Notes (Signed)
[place]  [phone] [phone]  ID: Amanda Pena OB: 1956/04/07  MR#: [id number]  URK#:[id number]  Patient Care Team: Cassandria Anger, MD as PCP - General (Internal Medicine) Arvella Nigh, MD as Consulting Physician (Obstetrics and Gynecology) Clent Jacks, RN as Oncology Nurse Navigator  CHIEF COMPLAINT: Recurrent platinum resistant ovarian cancer.  INTERVAL HISTORY: Patient returns to clinic today for further evaluation and consideration of cycle 4, day 8 of Taxol and Avastin.  Taxol only today.  She had a paracentesis last week which removed 2.6 L of fluid.  She initially had significant abdominal pain after her procedure, but this is resolved and she feels back to her baseline. Her peripheral neuropathy is unchanged. She is tolerating her treatments well without significant side effects. She has no other neurologic complaints.  She denies any recent fevers or illnesses.  She has a good appetite denies weight loss.  She has no chest pain, shortness of breath, cough, or hemoptysis.  She denies any nausea, vomiting, constipation, or diarrhea.  She has no urinary complaints.  Patient offers no further specific complaints today.  REVIEW OF SYSTEMS:   Review of Systems  Constitutional: Negative.  Negative for fever, malaise/fatigue and weight loss.  Respiratory: Negative.  Negative for cough, hemoptysis and shortness of breath.   Cardiovascular: Negative.  Negative for chest pain and leg swelling.  Gastrointestinal: Negative.  Negative for abdominal pain and nausea.  Genitourinary: Negative.  Negative for dysuria.  Musculoskeletal: Negative.  Negative for back pain.  Skin: Negative.  Negative for rash.  Neurological: Positive for sensory change. Negative for dizziness, focal weakness, weakness and headaches.  Psychiatric/Behavioral: Negative.  The patient is not nervous/anxious.     As per HPI. Otherwise, a complete review of  systems is negative.  PAST MEDICAL HISTORY: Past Medical History:  Diagnosis Date  . Cancer ([place])   . Family history of breast cancer   . Family history of lung cancer   . HYPERLIPIDEMIA 04/09/2008   Qualifier: Diagnosis of  By: Wynona Luna   . HYPOTHYROIDISM 04/09/2008   Qualifier: Diagnosis of  By: Wynona Luna   . INSOMNIA, CHRONIC 08/21/2010   Qualifier: Diagnosis of  By: Wynona Luna   . PERSONAL HISTORY MALIGNANT NEOPLASM THYROID 08/21/2010   Qualifier: Diagnosis of  By: Wynona Luna     PAST SURGICAL HISTORY: Past Surgical History:  Procedure Laterality Date  . IR IMAGING GUIDED PORT INSERTION  07/18/2019  . THYROIDECTOMY, PARTIAL      FAMILY HISTORY: Family History  Problem Relation Age of Onset  . Arthritis Mother   . Hyperlipidemia Mother   . Hyperlipidemia Father   . Diabetes Father   . Hypertension Father   . Heart disease Father 37       CAD  . Lung cancer Father   . Breast cancer Maternal Grandmother        in 36s  . Cancer Maternal Aunt        unk type  . Cancer Maternal Uncle        unk type  . Cancer Paternal Uncle        unk type, d. 43s  . Skin cancer Daughter        basal cell     ADVANCED DIRECTIVES (Y/N):  N  HEALTH MAINTENANCE: Social History   Tobacco Use  . Smoking status: Never Smoker  . Smokeless tobacco: Never Used  Vaping Use  .  Vaping Use: Never used  Substance Use Topics  . Alcohol use: Not Currently  . Drug use: No     Colonoscopy:  PAP:  Bone density:  Lipid panel:  No Known Allergies  Current Outpatient Medications  Medication Sig Dispense Refill  . ALPRAZolam (XANAX) 0.5 MG tablet Take 1 tablet (0.5 mg total) by mouth 2 (two) times daily as needed for anxiety. 60 tablet 2  . Artificial Tear Solution (20/20 ARTIFICIAL TEARS OP) Apply 2 drops to eye daily as needed.     . Ascorbic Acid (VITAMIN C) 1000 MG tablet Take 1,000 mg by mouth in the morning and at bedtime.     . Cholecalciferol (VITAMIN  D3) 50 MCG (2000 UT) TABS Take 1 tablet by mouth in the morning and at bedtime.    . gabapentin (NEURONTIN) 300 MG capsule Take 300 mg by mouth at bedtime.    . lidocaine-prilocaine (EMLA) cream Apply generously to the port area 45-60 mins prior. 30 g 0  . MELATONIN PO Take 5 mg by mouth daily as needed.     . Omega-3 Fatty Acids (FISH OIL) 1000 MG CAPS Take 2 capsules by mouth daily.    . ondansetron (ZOFRAN) 8 MG tablet One pill every 8 hours as needed for nausea/vomitting. 40 tablet 1  . polyethylene glycol (MIRALAX / GLYCOLAX) 17 g packet Take 17 g by mouth daily as needed.     . Probiotic Product (PROBIOTIC-10) CHEW Chew 1 capsule by mouth.    . prochlorperazine (COMPAZINE) 10 MG tablet Take 1 tablet (10 mg total) by mouth every 6 (six) hours as needed for nausea or vomiting. 40 tablet 1  . promethazine (PHENERGAN) 25 MG tablet Take 1 tablet (25 mg total) by mouth every 6 (six) hours as needed for nausea or vomiting. 30 tablet 0  . senna-docusate (SENOKOT-S) 8.6-50 MG tablet Take by mouth.    . SYNTHROID 75 MCG tablet Take 1 tablet (75 mcg total) by mouth daily before breakfast. 30 tablet 0  . zolpidem (AMBIEN) 10 MG tablet TAKE 1 TABLET BY MOUTH EVERY DAY AT BEDTIME AS NEEDED FOR SLEEP 30 tablet 1  . DULoxetine (CYMBALTA) 20 MG capsule Take 1 capsule (20 mg total) by mouth daily. (Patient not taking: No sig reported) 30 capsule 2   No current facility-administered medications for this visit.    OBJECTIVE: Vitals:   09/24/20 0918  BP: 113/83  Pulse: 82  Resp: 18  Temp: 98.9 F (37.2 C)  SpO2: 100%     Body mass index is 18.93 kg/m.    ECOG FS:1 - Symptomatic but completely ambulatory   General: Well-developed, well-nourished, no acute distress. Eyes: Pink conjunctiva, anicteric sclera. HEENT: Normocephalic, moist mucous membranes. Lungs: No audible wheezing or coughing. Heart: Regular rate and rhythm. Abdomen: Soft, nontender, no obvious distention. Musculoskeletal: No edema,  cyanosis, or clubbing. Neuro: Alert, answering all questions appropriately. Cranial nerves grossly intact. Skin: No rashes or petechiae noted. Psych: Normal affect.  LAB RESULTS:  Lab Results  Component Value Date   NA 138 09/24/2020   K 4.1 09/24/2020   CL 103 09/24/2020   CO2 26 09/24/2020   GLUCOSE 116 (H) 09/24/2020   BUN 20 09/24/2020   CREATININE 0.71 09/24/2020   CALCIUM 9.1 09/24/2020   PROT 7.4 09/24/2020   ALBUMIN 3.8 09/24/2020   AST 19 09/24/2020   ALT 17 09/24/2020   ALKPHOS 91 09/24/2020   BILITOT 0.9 09/24/2020   GFRNONAA >60 09/24/2020   GFRAA >60  01/29/2020    Lab Results  Component Value Date   WBC 5.9 09/24/2020   NEUTROABS 3.9 09/24/2020   HGB 11.4 (L) 09/24/2020   HCT 35.6 (L) 09/24/2020   MCV 93.7 09/24/2020   PLT 207 09/24/2020     STUDIES: NM PET Image Restag (PS) Skull Base To Thigh  Result Date: 09/12/2020 CLINICAL DATA:  Subsequent treatment strategy for ovarian cancer. EXAM: NUCLEAR MEDICINE PET SKULL BASE TO THIGH TECHNIQUE: 6.513 mCi F-18 FDG was injected intravenously. Full-ring PET imaging was performed from the skull base to thigh after the radiotracer. CT data was obtained and used for attenuation correction and anatomic localization. Fasting blood glucose: 64 mg/dl COMPARISON:  Multiple prior studies, most recent Dec 13, 2019 FINDINGS: Mediastinal blood pool activity: SUV max 2.44 Liver activity: SUV max NA NECK: LEFT level IV lymph node (image 56, series 3) new from the previous study measuring approximately 8 mm short axis with a maximum SUV of 9.7. This lymph node shows calcification. Incidental CT findings: none CHEST: RIGHT paratracheal lymph node (image 81, series 3) low level uptake maximum SUV of 3.7. Esophageal uptake, segmental along the distal esophagus maximum SUV approximately 5.8. Esophagus surrounded by ascites. This ascites is new and tracks from the abdomen into the chest. No additional areas of increased metabolic activity  in the chest Incidental CT findings: Calcified coronary artery disease. Heart size normal without pericardial effusion. The central pulmonary vasculature normal caliber. Limited assessment of cardiovascular structures given lack of intravenous contrast. RIGHT-sided Port-A-Cath terminates in the upper RIGHT atrium. No effusion. No consolidation. Tiny juxtapleural nodule on image 114 of series 3 approximately 2-3 mm. Airways are patent. ABDOMEN/PELVIS: Some areas of disease along the liver margin show diminished activity as compared to previous imaging but there is increased activity in the inter lobar fissure along the false form ligament were ascites tracks into the ligament. (Image 137, series 3) maximum SUV of 8. Area expanding the inter lobar fissure measuring approximately 2.4 x 1.8 cm. Activity along the caudate margin and inferior RIGHT hepatic lobe with decreased size but with similar level of activity with a maximum SUV of approximately 6.3 as compared to 6.1 on the prior study on image 134 series 3. Areas along the anterior surface of the liver do not show the same level of uptake. Interval development of retroperitoneal adenopathy showing calcification and hypermetabolic features. Celiac lymph node on image 137 of series 3 measuring 0.9 cm with a maximum SUV of 6.2 Intra-aortocaval lymph node on image 152 of series 3 with a maximum SUV of 7.9 size slightly less than a cm. 0.9 cm lymph node on image 168 of series 3 with a maximum SUV of 13.1 Common iliac lymph node/low intra-aortocaval lymph nodes on image 175 through image 180 of series 3 with a maximum SUV of 13.2 RIGHT pelvic sidewall lymph node 1 cm on image 213 of series 3 with a maximum SUV of 5.3 Many of the plaque-like areas of uptake along the peritoneal surface are not as apparent including areas along the under surface of the LEFT hepatic lobe but there is generalized moderately large volume of ascites that has developed since the previous study.  Cystic area adjacent to a rectal anastomosis in the pelvis is stable measuring approximately 2 cm greatest axial dimension showing no increased metabolic activity. Incidental CT findings: Increase in ascites as described. Scalloping of the liver margin. Sludge in the gallbladder. Pancreas is unremarkable. Spleen, adrenal glands and kidneys without acute process. Mildly  dilated central abdominal small bowel loops suspended in ascites. These are fluid-filled. Stool and gas seen throughout the colon. No pericolonic stranding. Atheromatous plaque in the abdominal aorta. SKELETON: No focal hypermetabolic activity to suggest skeletal metastasis. Incidental CT findings: none IMPRESSION: 1. Interval development of nodal disease in the abdomen and pelvis, extension of nodal disease to the LEFT thoracic inlet and possible mediastinal nodal disease as described. 2. Large volume ascites which is developed in the interval. 3. Some areas of serosal and peritoneal implants have resolved in terms of FDG uptake though with new areas, particularly in the inter lobar fissure between medial and lateral segment of the LEFT hepatic lobe. 4. Ascites scallops the liver margins and extends into the chest. 5. Segmental esophageal uptake may represent implants extending to the esophagus as there is ascites which tracks through the esophageal hiatus into the chest or esophagitis, attention on follow-up and correlation with any symptoms. 6. Mild generalized bowel distension in the central abdomen without overt signs of obstruction. Correlate with any new abdominal symptoms and attention on follow-up as the patient may be at risk for developing malignant adhesions. Aortic Atherosclerosis (ICD10-I70.0). Electronically Signed   By: Zetta Bills M.D.   On: 09/12/2020 16:47   US Paracentesis  Result Date: 09/20/2020 INDICATION: Ovarian Cancer with presumed malignant ascites. Request for therapeutic paracentesis. EXAM: ULTRASOUND GUIDED  PARACENTESIS MEDICATIONS: 1% lidocaine 10 mL COMPLICATIONS: None immediate. PROCEDURE: Informed written consent was obtained from the patient after a discussion of the risks, benefits and alternatives to treatment. A timeout was performed prior to the initiation of the procedure. Initial ultrasound scanning demonstrates a moderate amount of ascites within the right lateral abdomen. The right lateral abdomen was prepped and draped in the usual sterile fashion. 1% lidocaine was used for local anesthesia. Following this, a 19 gauge, 7-cm, Yueh catheter was introduced. An ultrasound image was saved for documentation purposes. The paracentesis was performed. The catheter was removed and a dressing was applied. The patient tolerated the procedure well without immediate post procedural complication. FINDINGS: A total of approximately 2.6 of hazy yellow fluid was removed. IMPRESSION: Successful ultrasound-guided paracentesis yielding 2.6 liters of peritoneal fluid. Read by: Gareth Eagle, PA-C Electronically Signed   By: Sandi Mariscal M.D.   On: 09/20/2020 09:33    ASSESSMENT:  Recurrent platinum resistant ovarian cancer.  PLAN:   1.  Recurrent platinum resistant ovarian cancer: Patient's CA-125 at [facility] prior to initiating treatment was 352.6. Her tumor marker is now ranged between 239 and 268 since July 17, 2020.  CT scan results from June 18, 2020 reviewed independently revealing large volume ascites with peritoneal thickening and enlarging retroperitoneal lymphadenopathy concerning for progressive disease.  PET scan results from September 12, 2020 reviewed independently with progression of disease since May 2021, but appears to be stable since November 2021.  Will further discuss with gynecology oncology. Previously patient has been treated with surgery, carboplatinum and Taxol, liposomal doxorubicin with Avastin plus or minus atezolizumab on clinical trial GY009.  She received cycle 1 of weekly  Taxol on days 1, 8, and 15 along with Avastin on days 1 and 15 at [facility]. After lengthy discussion with the patient and her daughter, it was agreed upon that despite the mild progression of disease there is not enough to change treatment at this time.  Proceed with cycle 4, day 8 of treatment which is Taxol only today.  Return to clinic in 1 week for further evaluation and consideration of cycle  4, day 15.  Plan to repeat PET scan at the conclusion of cycle 6.  2.  Neuropathy: Chronic and unchanged. Patient admits to occasional balance issues and problems sleeping. She has discontinued Cymbalta and is now back on gabapentin.  Referral has also been made for acupuncture. 3.  Anemia: Chronic and unchanged.  Patient's hemoglobin is 11.4 today. 4.  Insomnia: Chronic and unchanged.  Continue Ambien as needed. 5.  Abdominal bloating: Resolved.  Paracentesis on September 20, 2020 removed 2.6 L of fluid.  Patient expressed understanding and was in agreement with this plan. She also understands that She can call clinic at any time with any questions, concerns, or complaints.   Cancer Staging Malignant neoplasm of ovary ([place]) Staging form: Ovary, Fallopian Tube, and Primary Peritoneal Carcinoma, AJCC 8th Edition - Clinical: FIGO Stage IVA (pM1a) - Signed by Lloyd Huger, MD on 07/17/2020   Lloyd Huger, MD   09/25/2020 5:38 AM

## 2020-09-21 NOTE — Telephone Encounter (Signed)
Received call from Ms. Feagans via [facility] regarding abdominal pain after paracentesis yesterday.  RLQ para performed with 2.6 L fluid removed.  She states that immediately after the procedure and continuing into today she has had abdominal pain, fullness, and back pain that radiates across her back.  She states that she feels like she was a punching bag and is very sore.  The pain is exacerbated by moving, twisting.  She has also experienced some mild nausea.  She thinks her abdomen is more distended than it should be after the procedure, but attributes this to possibly being from her cancer.  Denies dizziness, confusion, lightheadedness.  Explained main concern is for intraabdominal or retroperitoneal hemorrhage after paracentesis.  She stated understanding and if she has any worsening pain, lightheadedness, dizziness, will present to ED.  Could also be generalized abdominal pain related to malignancy.  If she arrives in ED - recommend CTA abdomen pelvis GI bleed protocol to local source of potential hemorrhage.  Please notify IR in this case.   Ruthann Cancer, MD Pager: [phone]

## 2020-09-24 ENCOUNTER — Inpatient Hospital Stay: Payer: Self-pay

## 2020-09-24 ENCOUNTER — Inpatient Hospital Stay (HOSPITAL_BASED_OUTPATIENT_CLINIC_OR_DEPARTMENT_OTHER): Payer: Self-pay | Admitting: Oncology

## 2020-09-24 ENCOUNTER — Other Ambulatory Visit: Payer: Self-pay

## 2020-09-24 ENCOUNTER — Encounter: Payer: Self-pay | Admitting: Oncology

## 2020-09-24 VITALS — BP 113/83 | HR 82 | Temp 98.9°F | Resp 18 | Wt 117.3 lb

## 2020-09-24 DIAGNOSIS — C482 Malignant neoplasm of peritoneum, unspecified: Secondary | ICD-10-CM

## 2020-09-24 DIAGNOSIS — C569 Malignant neoplasm of unspecified ovary: Secondary | ICD-10-CM

## 2020-09-24 DIAGNOSIS — C786 Secondary malignant neoplasm of retroperitoneum and peritoneum: Secondary | ICD-10-CM

## 2020-09-24 DIAGNOSIS — Z95828 Presence of other vascular implants and grafts: Secondary | ICD-10-CM

## 2020-09-24 LAB — CBC WITH DIFFERENTIAL/PLATELET
Abs Immature Granulocytes: 0.07 10*3/uL (ref 0.00–0.07)
Basophils Absolute: 0 10*3/uL (ref 0.0–0.1)
Basophils Relative: 1 %
Eosinophils Absolute: 0.1 10*3/uL (ref 0.0–0.5)
Eosinophils Relative: 2 %
HCT: 35.6 % — ABNORMAL LOW (ref 36.0–46.0)
Hemoglobin: 11.4 g/dL — ABNORMAL LOW (ref 12.0–15.0)
Immature Granulocytes: 1 %
Lymphocytes Relative: 24 %
Lymphs Abs: 1.4 10*3/uL (ref 0.7–4.0)
MCH: 30 pg (ref 26.0–34.0)
MCHC: 32 g/dL (ref 30.0–36.0)
MCV: 93.7 fL (ref 80.0–100.0)
Monocytes Absolute: 0.3 10*3/uL (ref 0.1–1.0)
Monocytes Relative: 5 %
Neutro Abs: 3.9 10*3/uL (ref 1.7–7.7)
Neutrophils Relative %: 67 %
Platelets: 207 10*3/uL (ref 150–400)
RBC: 3.8 MIL/uL — ABNORMAL LOW (ref 3.87–5.11)
RDW: 15.9 % — ABNORMAL HIGH (ref 11.5–15.5)
WBC: 5.9 10*3/uL (ref 4.0–10.5)
nRBC: 0 % (ref 0.0–0.2)

## 2020-09-24 LAB — COMPREHENSIVE METABOLIC PANEL
ALT: 17 U/L (ref 0–44)
AST: 19 U/L (ref 15–41)
Albumin: 3.8 g/dL (ref 3.5–5.0)
Alkaline Phosphatase: 91 U/L (ref 38–126)
Anion gap: 9 (ref 5–15)
BUN: 20 mg/dL (ref 8–23)
CO2: 26 mmol/L (ref 22–32)
Calcium: 9.1 mg/dL (ref 8.9–10.3)
Chloride: 103 mmol/L (ref 98–111)
Creatinine, Ser: 0.71 mg/dL (ref 0.44–1.00)
GFR, Estimated: >60 mL/min (ref >60)
Glucose, Bld: 116 mg/dL — ABNORMAL HIGH (ref 70–99)
Potassium: 4.1 mmol/L (ref 3.5–5.1)
Sodium: 138 mmol/L (ref 135–145)
Total Bilirubin: 0.9 mg/dL (ref 0.3–1.2)
Total Protein: 7.4 g/dL (ref 6.5–8.1)

## 2020-09-24 MED ORDER — HEPARIN SOD (PORK) LOCK FLUSH 100 UNIT/ML IV SOLN
500.0000 [IU] | Freq: Once | INTRAVENOUS | Status: AC
  Administered 2020-09-24: 500 [IU] via INTRAVENOUS
  Filled 2020-09-24: qty 5

## 2020-09-24 MED ORDER — SODIUM CHLORIDE 0.9 % IV SOLN
Freq: Once | INTRAVENOUS | Status: AC
  Filled 2020-09-24: qty 250

## 2020-09-24 MED ORDER — SODIUM CHLORIDE 0.9% FLUSH
10.0000 mL | Freq: Once | INTRAVENOUS | Status: AC
  Administered 2020-09-24: 10 mL via INTRAVENOUS
  Filled 2020-09-24: qty 10

## 2020-09-24 MED ORDER — SODIUM CHLORIDE 0.9 % IV SOLN
10.0000 mg | Freq: Once | INTRAVENOUS | Status: AC
  Administered 2020-09-24: 10 mg via INTRAVENOUS
  Filled 2020-09-24: qty 10

## 2020-09-24 MED ORDER — DIPHENHYDRAMINE HCL 50 MG/ML IJ SOLN
25.0000 mg | Freq: Once | INTRAMUSCULAR | Status: AC
  Administered 2020-09-24: 25 mg via INTRAVENOUS
  Filled 2020-09-24: qty 1

## 2020-09-24 MED ORDER — FAMOTIDINE IN NACL 20-0.9 MG/50ML-% IV SOLN
20.0000 mg | Freq: Once | INTRAVENOUS | Status: AC
  Administered 2020-09-24: 20 mg via INTRAVENOUS
  Filled 2020-09-24: qty 50

## 2020-09-24 MED ORDER — HEPARIN SOD (PORK) LOCK FLUSH 100 UNIT/ML IV SOLN
INTRAVENOUS | Status: AC
  Filled 2020-09-24: qty 5

## 2020-09-24 MED ORDER — SODIUM CHLORIDE 0.9 % IV SOLN
80.0000 mg/m2 | Freq: Once | INTRAVENOUS | Status: AC
  Administered 2020-09-24: 126 mg via INTRAVENOUS
  Filled 2020-09-24: qty 21

## 2020-09-27 ENCOUNTER — Other Ambulatory Visit: Payer: Self-pay | Admitting: Internal Medicine

## 2020-09-27 DIAGNOSIS — F411 Generalized anxiety disorder: Secondary | ICD-10-CM

## 2020-09-28 NOTE — Progress Notes (Signed)
[place]  [phone]) [phone] [phone]) [phone]  ID: Amanda Pena OB: 1955-08-06  MR#: [id number]  ENI#:[id number]  Patient Care Team: Cassandria Anger, MD as PCP - General (Internal Medicine) Arvella Nigh, MD as Consulting Physician (Obstetrics and Gynecology) Clent Jacks, RN as Oncology Nurse Navigator  CHIEF COMPLAINT: Recurrent platinum resistant ovarian cancer.  INTERVAL HISTORY: Patient returns to clinic today for further evaluation and consideration of cycle 4, day 15 of Taxol and Avastin.  She currently feels well and is asymptomatic.  She no longer has abdominal pain or bloating.  Her peripheral neuropathy is unchanged. She is tolerating her treatments well without significant side effects. She has no other neurologic complaints.  She denies any recent fevers or illnesses. She has a good appetite denies weight loss.  She has no chest pain, shortness of breath, cough, or hemoptysis.  She denies any nausea, vomiting, constipation, or diarrhea.  She has no urinary complaints.  Patient offers no further specific complaints today.  REVIEW OF SYSTEMS:   Review of Systems  Constitutional: Negative.  Negative for fever, malaise/fatigue and weight loss.  Respiratory: Negative.  Negative for cough, hemoptysis and shortness of breath.   Cardiovascular: Negative.  Negative for chest pain and leg swelling.  Gastrointestinal: Negative.  Negative for abdominal pain and nausea.  Genitourinary: Negative.  Negative for dysuria.  Musculoskeletal: Negative.  Negative for back pain.  Skin: Negative.  Negative for rash.  Neurological: Positive for sensory change. Negative for dizziness, focal weakness, weakness and headaches.  Psychiatric/Behavioral: Negative.  The patient is not nervous/anxious.     As per HPI. Otherwise, a complete review of systems is negative.  PAST MEDICAL HISTORY: Past Medical History:  Diagnosis Date  . Cancer ([place])   . Family  history of breast cancer   . Family history of lung cancer   . HYPERLIPIDEMIA 04/09/2008   Qualifier: Diagnosis of  By: Wynona Luna   . HYPOTHYROIDISM 04/09/2008   Qualifier: Diagnosis of  By: Wynona Luna   . INSOMNIA, CHRONIC 08/21/2010   Qualifier: Diagnosis of  By: Wynona Luna   . PERSONAL HISTORY MALIGNANT NEOPLASM THYROID 08/21/2010   Qualifier: Diagnosis of  By: Wynona Luna     PAST SURGICAL HISTORY: Past Surgical History:  Procedure Laterality Date  . IR IMAGING GUIDED PORT INSERTION  07/18/2019  . THYROIDECTOMY, PARTIAL      FAMILY HISTORY: Family History  Problem Relation Age of Onset  . Arthritis Mother   . Hyperlipidemia Mother   . Hyperlipidemia Father   . Diabetes Father   . Hypertension Father   . Heart disease Father 66       CAD  . Lung cancer Father   . Breast cancer Maternal Grandmother        in 75s  . Cancer Maternal Aunt        unk type  . Cancer Maternal Uncle        unk type  . Cancer Paternal Uncle        unk type, d. 80s  . Skin cancer Daughter        basal cell     ADVANCED DIRECTIVES (Y/N):  N  HEALTH MAINTENANCE: Social History   Tobacco Use  . Smoking status: Never Smoker  . Smokeless tobacco: Never Used  Vaping Use  . Vaping Use: Never used  Substance Use Topics  . Alcohol use: Not Currently  . Drug use: No  Colonoscopy:  PAP:  Bone density:  Lipid panel:  No Known Allergies  Current Outpatient Medications  Medication Sig Dispense Refill  . ALPRAZolam (XANAX) 0.5 MG tablet Take 1 tablet (0.5 mg total) by mouth 2 (two) times daily as needed for anxiety. 60 tablet 2  . Artificial Tear Solution (20/20 ARTIFICIAL TEARS OP) Apply 2 drops to eye daily as needed.     . Ascorbic Acid (VITAMIN C) 1000 MG tablet Take 1,000 mg by mouth in the morning and at bedtime.     . Cholecalciferol (VITAMIN D3) 50 MCG (2000 UT) TABS Take 1 tablet by mouth in the morning and at bedtime.    . gabapentin (NEURONTIN) 300  MG capsule Take 300 mg by mouth at bedtime.    . lidocaine-prilocaine (EMLA) cream Apply generously to the port area 45-60 mins prior. 30 g 0  . MELATONIN PO Take 5 mg by mouth daily as needed.     . Omega-3 Fatty Acids (FISH OIL) 1000 MG CAPS Take 2 capsules by mouth daily.    . ondansetron (ZOFRAN) 8 MG tablet One pill every 8 hours as needed for nausea/vomitting. 40 tablet 1  . polyethylene glycol (MIRALAX / GLYCOLAX) 17 g packet Take 17 g by mouth daily as needed.     . Probiotic Product (PROBIOTIC-10) CHEW Chew 1 capsule by mouth.    . prochlorperazine (COMPAZINE) 10 MG tablet Take 1 tablet (10 mg total) by mouth every 6 (six) hours as needed for nausea or vomiting. 40 tablet 1  . promethazine (PHENERGAN) 25 MG tablet Take 1 tablet (25 mg total) by mouth every 6 (six) hours as needed for nausea or vomiting. 30 tablet 0  . senna-docusate (SENOKOT-S) 8.6-50 MG tablet Take by mouth.    . SYNTHROID 75 MCG tablet Take 1 tablet (75 mcg total) by mouth daily before breakfast. 30 tablet 0  . zolpidem (AMBIEN) 10 MG tablet TAKE 1 TABLET BY MOUTH EVERY DAY AT BEDTIME AS NEEDED FOR SLEEP 30 tablet 1  . DULoxetine (CYMBALTA) 20 MG capsule Take 1 capsule (20 mg total) by mouth daily. (Patient not taking: No sig reported) 30 capsule 2   No current facility-administered medications for this visit.    OBJECTIVE: Vitals:   10/01/20 0945  BP: 125/77  Pulse: 90  Resp: 18  Temp: 98.7 F (37.1 C)     Body mass index is 19.05 kg/m.    ECOG FS:1 - Symptomatic but completely ambulatory   General: Well-developed, well-nourished, no acute distress. Eyes: Pink conjunctiva, anicteric sclera. HEENT: Normocephalic, moist mucous membranes. Lungs: No audible wheezing or coughing. Heart: Regular rate and rhythm. Abdomen: Soft, nontender, no obvious distention. Musculoskeletal: No edema, cyanosis, or clubbing. Neuro: Alert, answering all questions appropriately. Cranial nerves grossly intact. Skin: No rashes  or petechiae noted. Psych: Normal affect.  LAB RESULTS:  Lab Results  Component Value Date   NA 137 10/01/2020   K 4.0 10/01/2020   CL 101 10/01/2020   CO2 26 10/01/2020   GLUCOSE 129 (H) 10/01/2020   BUN 20 10/01/2020   CREATININE 0.82 10/01/2020   CALCIUM 9.0 10/01/2020   PROT 7.1 10/01/2020   ALBUMIN 3.6 10/01/2020   AST 27 10/01/2020   ALT 18 10/01/2020   ALKPHOS 87 10/01/2020   BILITOT 0.6 10/01/2020   GFRNONAA >60 10/01/2020   GFRAA >60 01/29/2020    Lab Results  Component Value Date   WBC 4.8 10/01/2020   NEUTROABS 3.1 10/01/2020   HGB 11.1 (L) 10/01/2020  HCT 33.8 (L) 10/01/2020   MCV 92.6 10/01/2020   PLT 226 10/01/2020     STUDIES: NM PET Image Restag (PS) Skull Base To Thigh  Result Date: 09/12/2020 CLINICAL DATA:  Subsequent treatment strategy for ovarian cancer. EXAM: NUCLEAR MEDICINE PET SKULL BASE TO THIGH TECHNIQUE: 6.513 mCi F-18 FDG was injected intravenously. Full-ring PET imaging was performed from the skull base to thigh after the radiotracer. CT data was obtained and used for attenuation correction and anatomic localization. Fasting blood glucose: 64 mg/dl COMPARISON:  Multiple prior studies, most recent Dec 13, 2019 FINDINGS: Mediastinal blood pool activity: SUV max 2.44 Liver activity: SUV max NA NECK: LEFT level IV lymph node (image 56, series 3) new from the previous study measuring approximately 8 mm short axis with a maximum SUV of 9.7. This lymph node shows calcification. Incidental CT findings: none CHEST: RIGHT paratracheal lymph node (image 81, series 3) low level uptake maximum SUV of 3.7. Esophageal uptake, segmental along the distal esophagus maximum SUV approximately 5.8. Esophagus surrounded by ascites. This ascites is new and tracks from the abdomen into the chest. No additional areas of increased metabolic activity in the chest Incidental CT findings: Calcified coronary artery disease. Heart size normal without pericardial effusion. The  central pulmonary vasculature normal caliber. Limited assessment of cardiovascular structures given lack of intravenous contrast. RIGHT-sided Port-A-Cath terminates in the upper RIGHT atrium. No effusion. No consolidation. Tiny juxtapleural nodule on image 114 of series 3 approximately 2-3 mm. Airways are patent. ABDOMEN/PELVIS: Some areas of disease along the liver margin show diminished activity as compared to previous imaging but there is increased activity in the inter lobar fissure along the false form ligament were ascites tracks into the ligament. (Image 137, series 3) maximum SUV of 8. Area expanding the inter lobar fissure measuring approximately 2.4 x 1.8 cm. Activity along the caudate margin and inferior RIGHT hepatic lobe with decreased size but with similar level of activity with a maximum SUV of approximately 6.3 as compared to 6.1 on the prior study on image 134 series 3. Areas along the anterior surface of the liver do not show the same level of uptake. Interval development of retroperitoneal adenopathy showing calcification and hypermetabolic features. Celiac lymph node on image 137 of series 3 measuring 0.9 cm with a maximum SUV of 6.2 Intra-aortocaval lymph node on image 152 of series 3 with a maximum SUV of 7.9 size slightly less than a cm. 0.9 cm lymph node on image 168 of series 3 with a maximum SUV of 13.1 Common iliac lymph node/low intra-aortocaval lymph nodes on image 175 through image 180 of series 3 with a maximum SUV of 13.2 RIGHT pelvic sidewall lymph node 1 cm on image 213 of series 3 with a maximum SUV of 5.3 Many of the plaque-like areas of uptake along the peritoneal surface are not as apparent including areas along the under surface of the LEFT hepatic lobe but there is generalized moderately large volume of ascites that has developed since the previous study. Cystic area adjacent to a rectal anastomosis in the pelvis is stable measuring approximately 2 cm greatest axial dimension  showing no increased metabolic activity. Incidental CT findings: Increase in ascites as described. Scalloping of the liver margin. Sludge in the gallbladder. Pancreas is unremarkable. Spleen, adrenal glands and kidneys without acute process. Mildly dilated central abdominal small bowel loops suspended in ascites. These are fluid-filled. Stool and gas seen throughout the colon. No pericolonic stranding. Atheromatous plaque in the abdominal aorta.  SKELETON: No focal hypermetabolic activity to suggest skeletal metastasis. Incidental CT findings: none IMPRESSION: 1. Interval development of nodal disease in the abdomen and pelvis, extension of nodal disease to the LEFT thoracic inlet and possible mediastinal nodal disease as described. 2. Large volume ascites which is developed in the interval. 3. Some areas of serosal and peritoneal implants have resolved in terms of FDG uptake though with new areas, particularly in the inter lobar fissure between medial and lateral segment of the LEFT hepatic lobe. 4. Ascites scallops the liver margins and extends into the chest. 5. Segmental esophageal uptake may represent implants extending to the esophagus as there is ascites which tracks through the esophageal hiatus into the chest or esophagitis, attention on follow-up and correlation with any symptoms. 6. Mild generalized bowel distension in the central abdomen without overt signs of obstruction. Correlate with any new abdominal symptoms and attention on follow-up as the patient may be at risk for developing malignant adhesions. Aortic Atherosclerosis (ICD10-I70.0). Electronically Signed   By: Zetta Bills M.D.   On: 09/12/2020 16:47   US Paracentesis  Result Date: 09/20/2020 INDICATION: Ovarian Cancer with presumed malignant ascites. Request for therapeutic paracentesis. EXAM: ULTRASOUND GUIDED PARACENTESIS MEDICATIONS: 1% lidocaine 10 mL COMPLICATIONS: None immediate. PROCEDURE: Informed written consent was obtained from  the patient after a discussion of the risks, benefits and alternatives to treatment. A timeout was performed prior to the initiation of the procedure. Initial ultrasound scanning demonstrates a moderate amount of ascites within the right lateral abdomen. The right lateral abdomen was prepped and draped in the usual sterile fashion. 1% lidocaine was used for local anesthesia. Following this, a 19 gauge, 7-cm, Yueh catheter was introduced. An ultrasound image was saved for documentation purposes. The paracentesis was performed. The catheter was removed and a dressing was applied. The patient tolerated the procedure well without immediate post procedural complication. FINDINGS: A total of approximately 2.6 of hazy yellow fluid was removed. IMPRESSION: Successful ultrasound-guided paracentesis yielding 2.6 liters of peritoneal fluid. Read by: Gareth Eagle, PA-C Electronically Signed   By: Sandi Mariscal M.D.   On: 09/20/2020 09:33    ASSESSMENT:  Recurrent platinum resistant ovarian cancer.  PLAN:   1.  Recurrent platinum resistant ovarian cancer: Patient's CA-125 at [facility] prior to initiating treatment was 352.6. Her tumor marker is now ranged between 239 and 268 since July 17, 2020.  CT scan results from June 18, 2020 reviewed independently revealing large volume ascites with peritoneal thickening and enlarging retroperitoneal lymphadenopathy concerning for progressive disease.  PET scan results from September 12, 2020 reviewed independently with progression of disease since May 2021, but appears to be stable since November 2021.  Will further discuss with gynecology oncology. Previously patient has been treated with surgery, carboplatinum and Taxol, liposomal doxorubicin with Avastin plus or minus atezolizumab on clinical trial GY009.  She received cycle 1 of weekly Taxol on days 1, 8, and 15 along with Avastin on days 1 and 15 at [facility]. After lengthy discussion with the patient and her  daughter, it was agreed upon that despite the mild progression of disease there is not enough to change treatment at this time.  Proceed with cycle 4, day 15 of treatment today.  Return to clinic in 2 weeks for further evaluation and consideration of cycle 5, day 1.  Patient has requested to complete PET scan after cycle 6.  She also requests to be only evaluated on day 1 of each cycle. 2.  Neuropathy: Chronic  and unchanged. Patient admits to occasional balance issues and problems sleeping.  Despite this, she is still able to play tennis competitively.  She has discontinued Cymbalta and is now back on gabapentin.  Referral has also been made for acupuncture.  Patient was also given a referral to neuro oncology today. 3.  Anemia: Chronic and unchanged.  Patient's hemoglobin is 11.1 today. 4.  Insomnia: Chronic and unchanged.  Continue Ambien as needed. 5.  Abdominal bloating: Resolved.  Paracentesis on September 20, 2020 removed 2.6 L of fluid.  Patient expressed understanding and was in agreement with this plan. She also understands that She can call clinic at any time with any questions, concerns, or complaints.   Cancer Staging Malignant neoplasm of ovary ([place]) Staging form: Ovary, Fallopian Tube, and Primary Peritoneal Carcinoma, AJCC 8th Edition - Clinical: FIGO Stage IVA (pM1a) - Signed by Lloyd Huger, MD on 07/17/2020   Lloyd Huger, MD   10/01/2020 10:41 AM

## 2020-10-01 ENCOUNTER — Inpatient Hospital Stay: Payer: Self-pay

## 2020-10-01 ENCOUNTER — Encounter: Payer: Self-pay | Admitting: Oncology

## 2020-10-01 ENCOUNTER — Inpatient Hospital Stay (HOSPITAL_BASED_OUTPATIENT_CLINIC_OR_DEPARTMENT_OTHER): Payer: Self-pay | Admitting: Oncology

## 2020-10-01 VITALS — BP 125/77 | HR 90 | Temp 98.7°F | Resp 18 | Wt 118.0 lb

## 2020-10-01 DIAGNOSIS — C569 Malignant neoplasm of unspecified ovary: Secondary | ICD-10-CM

## 2020-10-01 DIAGNOSIS — C786 Secondary malignant neoplasm of retroperitoneum and peritoneum: Secondary | ICD-10-CM

## 2020-10-01 DIAGNOSIS — C482 Malignant neoplasm of peritoneum, unspecified: Secondary | ICD-10-CM

## 2020-10-01 LAB — CBC WITH DIFFERENTIAL/PLATELET
Abs Immature Granulocytes: 0.05 10*3/uL (ref 0.00–0.07)
Basophils Absolute: 0 10*3/uL (ref 0.0–0.1)
Basophils Relative: 1 %
Eosinophils Absolute: 0.1 10*3/uL (ref 0.0–0.5)
Eosinophils Relative: 2 %
HCT: 33.8 % — ABNORMAL LOW (ref 36.0–46.0)
Hemoglobin: 11.1 g/dL — ABNORMAL LOW (ref 12.0–15.0)
Immature Granulocytes: 1 %
Lymphocytes Relative: 26 %
Lymphs Abs: 1.2 10*3/uL (ref 0.7–4.0)
MCH: 30.4 pg (ref 26.0–34.0)
MCHC: 32.8 g/dL (ref 30.0–36.0)
MCV: 92.6 fL (ref 80.0–100.0)
Monocytes Absolute: 0.3 10*3/uL (ref 0.1–1.0)
Monocytes Relative: 6 %
Neutro Abs: 3.1 10*3/uL (ref 1.7–7.7)
Neutrophils Relative %: 64 %
Platelets: 226 10*3/uL (ref 150–400)
RBC: 3.65 MIL/uL — ABNORMAL LOW (ref 3.87–5.11)
RDW: 16.1 % — ABNORMAL HIGH (ref 11.5–15.5)
WBC: 4.8 10*3/uL (ref 4.0–10.5)
nRBC: 0 % (ref 0.0–0.2)

## 2020-10-01 LAB — COMPREHENSIVE METABOLIC PANEL
ALT: 18 U/L (ref 0–44)
AST: 27 U/L (ref 15–41)
Albumin: 3.6 g/dL (ref 3.5–5.0)
Alkaline Phosphatase: 87 U/L (ref 38–126)
Anion gap: 10 (ref 5–15)
BUN: 20 mg/dL (ref 8–23)
CO2: 26 mmol/L (ref 22–32)
Calcium: 9 mg/dL (ref 8.9–10.3)
Chloride: 101 mmol/L (ref 98–111)
Creatinine, Ser: 0.82 mg/dL (ref 0.44–1.00)
GFR, Estimated: >60 mL/min (ref >60)
Glucose, Bld: 129 mg/dL — ABNORMAL HIGH (ref 70–99)
Potassium: 4 mmol/L (ref 3.5–5.1)
Sodium: 137 mmol/L (ref 135–145)
Total Bilirubin: 0.6 mg/dL (ref 0.3–1.2)
Total Protein: 7.1 g/dL (ref 6.5–8.1)

## 2020-10-01 MED ORDER — SODIUM CHLORIDE 0.9 % IV SOLN: 10.0000 mg/kg | Freq: Once | INTRAVENOUS | Status: DC

## 2020-10-01 MED ORDER — SODIUM CHLORIDE 0.9 % IV SOLN
Freq: Once | INTRAVENOUS | Status: AC
  Filled 2020-10-01: qty 20

## 2020-10-01 MED ORDER — HEPARIN SOD (PORK) LOCK FLUSH 100 UNIT/ML IV SOLN
500.0000 [IU] | Freq: Once | INTRAVENOUS | Status: AC | PRN
Start: 2020-10-01 — End: 2020-10-01
  Administered 2020-10-01: 500 [IU]
  Filled 2020-10-01: qty 5

## 2020-10-01 MED ORDER — HEPARIN SOD (PORK) LOCK FLUSH 100 UNIT/ML IV SOLN
INTRAVENOUS | Status: AC
  Filled 2020-10-01: qty 5

## 2020-10-01 MED ORDER — SODIUM CHLORIDE 0.9 % IV SOLN
Freq: Once | INTRAVENOUS | Status: AC
  Filled 2020-10-01: qty 250

## 2020-10-01 MED ORDER — FAMOTIDINE IN NACL 20-0.9 MG/50ML-% IV SOLN: 20.0000 mg | Freq: Once | INTRAVENOUS | Status: DC

## 2020-10-01 MED ORDER — SODIUM CHLORIDE 0.9 % IV SOLN
80.0000 mg/m2 | Freq: Once | INTRAVENOUS | Status: AC
  Administered 2020-10-01: 126 mg via INTRAVENOUS
  Filled 2020-10-01: qty 21

## 2020-10-01 MED ORDER — SODIUM CHLORIDE 0.9 % IV SOLN
10.0000 mg/kg | Freq: Once | INTRAVENOUS | Status: AC
  Administered 2020-10-01: 500 mg via INTRAVENOUS
  Filled 2020-10-01: qty 16

## 2020-10-01 MED ORDER — SODIUM CHLORIDE 0.9 % IV SOLN
10.0000 mg | Freq: Once | INTRAVENOUS | Status: AC
  Administered 2020-10-01: 10 mg via INTRAVENOUS
  Filled 2020-10-01: qty 10

## 2020-10-01 MED ORDER — DIPHENHYDRAMINE HCL 50 MG/ML IJ SOLN
25.0000 mg | Freq: Once | INTRAMUSCULAR | Status: AC
  Administered 2020-10-01: 25 mg via INTRAVENOUS
  Filled 2020-10-01: qty 1

## 2020-10-01 NOTE — Progress Notes (Signed)
Per Dr Grayland Ormond, ok to proceed with mvasi based off on urine protein result from previous tx day.

## 2020-10-01 NOTE — Progress Notes (Signed)
Pt in for follow up, denies any difficulties today.  Pt request appts be changed to Wednesday or Thursday so she can play with her tennis group.

## 2020-10-01 NOTE — Addendum Note (Signed)
Addended by: Delice Bison E on: 10/01/2020 03:32 PM   Modules accepted: Orders

## 2020-10-06 ENCOUNTER — Encounter: Payer: Self-pay | Admitting: Oncology

## 2020-10-07 ENCOUNTER — Other Ambulatory Visit: Payer: Self-pay | Admitting: *Deleted

## 2020-10-07 MED ORDER — GABAPENTIN 300 MG PO CAPS: 300.0000 mg | ORAL_CAPSULE | Freq: Every day | ORAL | 1 refills | Status: DC

## 2020-10-10 NOTE — Progress Notes (Signed)
[facility]  [phone] [phone]) [phone]  ID: Amanda Pena OB: 12/03/1955  MR#: [id number]  QZE#:[id number]  Patient Care Team: Cassandria Anger, MD as PCP - General (Internal Medicine) Arvella Nigh, MD as Consulting Physician (Obstetrics and Gynecology) Clent Jacks, RN as Oncology Nurse Navigator  CHIEF COMPLAINT: Recurrent platinum resistant ovarian cancer.  INTERVAL HISTORY: Patient returns to clinic today for further evaluation and consideration of cycle 5, day 1 of Taxol and Avastin.  She continues to have a peripheral neuropathy that is chronic and unchanged.  Patient states acupuncture did not offer much benefit.  She otherwise feels well and is asymptomatic.  She is tolerating her treatments well without significant side effects. She has no other neurologic complaints.  She denies any recent fevers or illnesses. She has a good appetite denies weight loss.  She has no chest pain, shortness of breath, cough, or hemoptysis.  She denies any nausea, vomiting, constipation, or diarrhea.  She has no urinary complaints.  Patient offers no further specific complaints today.  REVIEW OF SYSTEMS:   Review of Systems  Constitutional: Negative.  Negative for fever, malaise/fatigue and weight loss.  Respiratory: Negative.  Negative for cough, hemoptysis and shortness of breath.   Cardiovascular: Negative.  Negative for chest pain and leg swelling.  Gastrointestinal: Negative.  Negative for abdominal pain and nausea.  Genitourinary: Negative.  Negative for dysuria.  Musculoskeletal: Negative.  Negative for back pain.  Skin: Negative.  Negative for rash.  Neurological: Positive for sensory change. Negative for dizziness, focal weakness, weakness and headaches.  Psychiatric/Behavioral: Negative.  The patient is not nervous/anxious.     As per HPI. Otherwise, a complete review of systems is negative.  PAST MEDICAL HISTORY: Past Medical History:   Diagnosis Date  . Cancer ([place])   . Family history of breast cancer   . Family history of lung cancer   . HYPERLIPIDEMIA 04/09/2008   Qualifier: Diagnosis of  By: Wynona Luna   . HYPOTHYROIDISM 04/09/2008   Qualifier: Diagnosis of  By: Wynona Luna   . INSOMNIA, CHRONIC 08/21/2010   Qualifier: Diagnosis of  By: Wynona Luna   . PERSONAL HISTORY MALIGNANT NEOPLASM THYROID 08/21/2010   Qualifier: Diagnosis of  By: Wynona Luna     PAST SURGICAL HISTORY: Past Surgical History:  Procedure Laterality Date  . IR IMAGING GUIDED PORT INSERTION  07/18/2019  . THYROIDECTOMY, PARTIAL      FAMILY HISTORY: Family History  Problem Relation Age of Onset  . Arthritis Mother   . Hyperlipidemia Mother   . Hyperlipidemia Father   . Diabetes Father   . Hypertension Father   . Heart disease Father 9       CAD  . Lung cancer Father   . Breast cancer Maternal Grandmother        in 58s  . Cancer Maternal Aunt        unk type  . Cancer Maternal Uncle        unk type  . Cancer Paternal Uncle        unk type, d. 41s  . Skin cancer Daughter        basal cell     ADVANCED DIRECTIVES (Y/N):  N  HEALTH MAINTENANCE: Social History   Tobacco Use  . Smoking status: Never Smoker  . Smokeless tobacco: Never Used  Vaping Use  . Vaping Use: Never used  Substance Use Topics  . Alcohol use:  Not Currently  . Drug use: No     Colonoscopy:  PAP:  Bone density:  Lipid panel:  No Known Allergies  Current Outpatient Medications  Medication Sig Dispense Refill  . ALPRAZolam (XANAX) 0.5 MG tablet Take 1 tablet (0.5 mg total) by mouth 2 (two) times daily as needed for anxiety. 60 tablet 2  . Artificial Tear Solution (20/20 ARTIFICIAL TEARS OP) Apply 2 drops to eye daily as needed.     . Ascorbic Acid (VITAMIN C) 1000 MG tablet Take 1,000 mg by mouth in the morning and at bedtime.     . Cholecalciferol (VITAMIN D3) 50 MCG (2000 UT) TABS Take 1 tablet by mouth in the morning and  at bedtime.    . gabapentin (NEURONTIN) 300 MG capsule Take 1 capsule (300 mg total) by mouth at bedtime. 30 capsule 1  . MELATONIN PO Take 5 mg by mouth daily as needed.     . Omega-3 Fatty Acids (FISH OIL) 1000 MG CAPS Take 2 capsules by mouth daily.    . ondansetron (ZOFRAN) 8 MG tablet One pill every 8 hours as needed for nausea/vomitting. 40 tablet 1  . polyethylene glycol (MIRALAX / GLYCOLAX) 17 g packet Take 17 g by mouth daily as needed.     . Probiotic Product (PROBIOTIC-10) CHEW Chew 1 capsule by mouth.    . prochlorperazine (COMPAZINE) 10 MG tablet Take 1 tablet (10 mg total) by mouth every 6 (six) hours as needed for nausea or vomiting. 40 tablet 1  . promethazine (PHENERGAN) 25 MG tablet Take 1 tablet (25 mg total) by mouth every 6 (six) hours as needed for nausea or vomiting. 30 tablet 0  . senna-docusate (SENOKOT-S) 8.6-50 MG tablet Take by mouth.    . SYNTHROID 75 MCG tablet Take 1 tablet (75 mcg total) by mouth daily before breakfast. 30 tablet 0  . zolpidem (AMBIEN) 10 MG tablet TAKE 1 TABLET BY MOUTH EVERY DAY AT BEDTIME AS NEEDED FOR SLEEP 30 tablet 1  . DULoxetine (CYMBALTA) 20 MG capsule Take 1 capsule (20 mg total) by mouth daily. (Patient not taking: Reported on 10/16/2020) 30 capsule 2  . lidocaine-prilocaine (EMLA) cream Apply generously to the port area 45-60 mins prior. 30 g 0   No current facility-administered medications for this visit.   Facility-Administered Medications Ordered in Other Visits  Medication Dose Route Frequency Provider Last Rate Last Admin  . sodium chloride flush (NS) 0.9 % injection 10 mL  10 mL Intracatheter PRN Lloyd Huger, MD        OBJECTIVE: Vitals:   10/16/20 0926  BP: 109/67  Pulse: 78  Resp: 16  Temp: 99 F (37.2 C)     Body mass index is 19.48 kg/m.    ECOG FS:1 - Symptomatic but completely ambulatory   General: Well-developed, well-nourished, no acute distress. Eyes: Pink conjunctiva, anicteric sclera. HEENT:  Normocephalic, moist mucous membranes. Lungs: No audible wheezing or coughing. Heart: Regular rate and rhythm. Abdomen: Soft, nontender, no obvious distention. Musculoskeletal: No edema, cyanosis, or clubbing. Neuro: Alert, answering all questions appropriately. Cranial nerves grossly intact. Skin: No rashes or petechiae noted. Psych: Normal affect.  LAB RESULTS:  Lab Results  Component Value Date   NA 137 10/16/2020   K 4.3 10/16/2020   CL 104 10/16/2020   CO2 27 10/16/2020   GLUCOSE 109 (H) 10/16/2020   BUN 19 10/16/2020   CREATININE 0.68 10/16/2020   CALCIUM 9.0 10/16/2020   PROT 6.8 10/16/2020   ALBUMIN  3.6 10/16/2020   AST 27 10/16/2020   ALT 21 10/16/2020   ALKPHOS 104 10/16/2020   BILITOT 0.7 10/16/2020   GFRNONAA >60 10/16/2020   GFRAA >60 01/29/2020    Lab Results  Component Value Date   WBC 5.3 10/16/2020   NEUTROABS 3.5 10/16/2020   HGB 11.5 (L) 10/16/2020   HCT 35.5 (L) 10/16/2020   MCV 94.2 10/16/2020   PLT 197 10/16/2020     STUDIES: US Paracentesis  Result Date: 09/20/2020 INDICATION: Ovarian Cancer with presumed malignant ascites. Request for therapeutic paracentesis. EXAM: ULTRASOUND GUIDED PARACENTESIS MEDICATIONS: 1% lidocaine 10 mL COMPLICATIONS: None immediate. PROCEDURE: Informed written consent was obtained from the patient after a discussion of the risks, benefits and alternatives to treatment. A timeout was performed prior to the initiation of the procedure. Initial ultrasound scanning demonstrates a moderate amount of ascites within the right lateral abdomen. The right lateral abdomen was prepped and draped in the usual sterile fashion. 1% lidocaine was used for local anesthesia. Following this, a 19 gauge, 7-cm, Yueh catheter was introduced. An ultrasound image was saved for documentation purposes. The paracentesis was performed. The catheter was removed and a dressing was applied. The patient tolerated the procedure well without immediate post  procedural complication. FINDINGS: A total of approximately 2.6 of hazy yellow fluid was removed. IMPRESSION: Successful ultrasound-guided paracentesis yielding 2.6 liters of peritoneal fluid. Read by: Gareth Eagle, PA-C Electronically Signed   By: Sandi Mariscal M.D.   On: 09/20/2020 09:33    ASSESSMENT:  Recurrent platinum resistant ovarian cancer.  PLAN:   1.  Recurrent platinum resistant ovarian cancer: Patient's CA-125 at [facility] prior to initiating treatment was 352.6. Her tumor marker is now ranged between 239 and 268 since July 17, 2020.  Today's result is pending.  CT scan results from June 18, 2020 reviewed independently revealing large volume ascites with peritoneal thickening and enlarging retroperitoneal lymphadenopathy concerning for progressive disease.  PET scan results from September 12, 2020 reviewed independently with progression of disease since May 2021, but appears to be stable since November 2021.  Case discussed with gynecology oncology.  Previously patient has been treated with surgery, carboplatinum and Taxol, liposomal doxorubicin with Avastin plus or minus atezolizumab on clinical trial GY009.  She received cycle 1 of weekly Taxol on days 1, 8, and 15 along with Avastin on days 1 and 15 at [facility]. After lengthy discussion with the patient and her daughter, it was agreed upon that despite the mild progression of disease there is not enough to change treatment at this time.  Proceed with cycle 5, day 1 of treatment today.  Return to clinic in 1 week for Taxol only and then in 2 weeks for both Taxol and Avastin.  Patient will then return to clinic in 3 weeks for further evaluation and consideration of cycle 6, day 1.  Patient has requested to complete PET scan after cycle 6.   2.  Neuropathy: Chronic and unchanged. Patient admits to occasional balance issues and problems sleeping.  Despite this, she is still able to play tennis competitively.  She has discontinued  Cymbalta and is now back on gabapentin.  Acupuncture offered no help.  Referral has been placed to neuro oncology. 3.  Anemia: Mildly improved.  Patient's hemoglobin is 11.1 today. 4.  Insomnia: Chronic and unchanged.  Continue Ambien as needed. 5.  Abdominal bloating: Resolved.  Paracentesis on September 20, 2020 removed 2.6 L of fluid.  Patient expressed understanding and was in  agreement with this plan. She also understands that She can call clinic at any time with any questions, concerns, or complaints.   Cancer Staging Malignant neoplasm of ovary ([place]) Staging form: Ovary, Fallopian Tube, and Primary Peritoneal Carcinoma, AJCC 8th Edition - Clinical: FIGO Stage IVA (pM1a) - Signed by Lloyd Huger, MD on 07/17/2020   Lloyd Huger, MD   10/16/2020 1:48 PM

## 2020-10-15 ENCOUNTER — Encounter: Payer: Self-pay | Admitting: Obstetrics and Gynecology

## 2020-10-16 ENCOUNTER — Inpatient Hospital Stay: Payer: Self-pay

## 2020-10-16 ENCOUNTER — Encounter: Payer: Self-pay | Admitting: Oncology

## 2020-10-16 ENCOUNTER — Inpatient Hospital Stay (HOSPITAL_BASED_OUTPATIENT_CLINIC_OR_DEPARTMENT_OTHER): Payer: Self-pay | Admitting: Oncology

## 2020-10-16 VITALS — BP 109/67 | HR 78 | Temp 99.0°F | Resp 16 | Wt 120.7 lb

## 2020-10-16 DIAGNOSIS — C569 Malignant neoplasm of unspecified ovary: Secondary | ICD-10-CM

## 2020-10-16 DIAGNOSIS — G620 Drug-induced polyneuropathy: Secondary | ICD-10-CM

## 2020-10-16 DIAGNOSIS — C786 Secondary malignant neoplasm of retroperitoneum and peritoneum: Secondary | ICD-10-CM

## 2020-10-16 DIAGNOSIS — T451X5A Adverse effect of antineoplastic and immunosuppressive drugs, initial encounter: Secondary | ICD-10-CM

## 2020-10-16 DIAGNOSIS — C482 Malignant neoplasm of peritoneum, unspecified: Secondary | ICD-10-CM

## 2020-10-16 LAB — COMPREHENSIVE METABOLIC PANEL
ALT: 21 U/L (ref 0–44)
AST: 27 U/L (ref 15–41)
Albumin: 3.6 g/dL (ref 3.5–5.0)
Alkaline Phosphatase: 104 U/L (ref 38–126)
Anion gap: 6 (ref 5–15)
BUN: 19 mg/dL (ref 8–23)
CO2: 27 mmol/L (ref 22–32)
Calcium: 9 mg/dL (ref 8.9–10.3)
Chloride: 104 mmol/L (ref 98–111)
Creatinine, Ser: 0.68 mg/dL (ref 0.44–1.00)
GFR, Estimated: >60 mL/min (ref >60)
Glucose, Bld: 109 mg/dL — ABNORMAL HIGH (ref 70–99)
Potassium: 4.3 mmol/L (ref 3.5–5.1)
Sodium: 137 mmol/L (ref 135–145)
Total Bilirubin: 0.7 mg/dL (ref 0.3–1.2)
Total Protein: 6.8 g/dL (ref 6.5–8.1)

## 2020-10-16 LAB — CBC WITH DIFFERENTIAL/PLATELET
Abs Immature Granulocytes: 0.02 10*3/uL (ref 0.00–0.07)
Basophils Absolute: 0 10*3/uL (ref 0.0–0.1)
Basophils Relative: 1 %
Eosinophils Absolute: 0 10*3/uL (ref 0.0–0.5)
Eosinophils Relative: 1 %
HCT: 35.5 % — ABNORMAL LOW (ref 36.0–46.0)
Hemoglobin: 11.5 g/dL — ABNORMAL LOW (ref 12.0–15.0)
Immature Granulocytes: 0 %
Lymphocytes Relative: 22 %
Lymphs Abs: 1.2 10*3/uL (ref 0.7–4.0)
MCH: 30.5 pg (ref 26.0–34.0)
MCHC: 32.4 g/dL (ref 30.0–36.0)
MCV: 94.2 fL (ref 80.0–100.0)
Monocytes Absolute: 0.5 10*3/uL (ref 0.1–1.0)
Monocytes Relative: 9 %
Neutro Abs: 3.5 10*3/uL (ref 1.7–7.7)
Neutrophils Relative %: 67 %
Platelets: 197 10*3/uL (ref 150–400)
RBC: 3.77 MIL/uL — ABNORMAL LOW (ref 3.87–5.11)
RDW: 16.7 % — ABNORMAL HIGH (ref 11.5–15.5)
WBC: 5.3 10*3/uL (ref 4.0–10.5)
nRBC: 0 % (ref 0.0–0.2)

## 2020-10-16 LAB — URINALYSIS, DIPSTICK ONLY
Bilirubin Urine: NEGATIVE
Glucose, UA: NEGATIVE mg/dL
Hgb urine dipstick: NEGATIVE
Ketones, ur: 5 mg/dL — AB
Nitrite: NEGATIVE
Protein, ur: NEGATIVE mg/dL
Specific Gravity, Urine: 1.017 (ref 1.005–1.030)
pH: 5 (ref 5.0–8.0)

## 2020-10-16 MED ORDER — DIPHENHYDRAMINE HCL 50 MG/ML IJ SOLN
25.0000 mg | Freq: Once | INTRAMUSCULAR | Status: AC
  Administered 2020-10-16: 25 mg via INTRAVENOUS
  Filled 2020-10-16: qty 1

## 2020-10-16 MED ORDER — HEPARIN SOD (PORK) LOCK FLUSH 100 UNIT/ML IV SOLN
500.0000 [IU] | Freq: Once | INTRAVENOUS | Status: AC | PRN
  Administered 2020-10-16: 500 [IU]
  Filled 2020-10-16: qty 5

## 2020-10-16 MED ORDER — LIDOCAINE-PRILOCAINE 2.5-2.5 % EX CREA: TOPICAL_CREAM | CUTANEOUS | 0 refills | Status: DC

## 2020-10-16 MED ORDER — SODIUM CHLORIDE 0.9 % IV SOLN
10.0000 mg | Freq: Once | INTRAVENOUS | Status: DC
  Filled 2020-10-16: qty 1

## 2020-10-16 MED ORDER — HEPARIN SOD (PORK) LOCK FLUSH 100 UNIT/ML IV SOLN
INTRAVENOUS | Status: AC
  Filled 2020-10-16: qty 5

## 2020-10-16 MED ORDER — SODIUM CHLORIDE 0.9 % IV SOLN
500.0000 mg | Freq: Once | INTRAVENOUS | Status: AC
  Administered 2020-10-16: 500 mg via INTRAVENOUS
  Filled 2020-10-16: qty 16

## 2020-10-16 MED ORDER — FAMOTIDINE 20 MG IN NS 100 ML IVPB
20.0000 mg | Freq: Once | INTRAVENOUS | Status: AC
  Administered 2020-10-16: 20 mg via INTRAVENOUS
  Filled 2020-10-16: qty 20

## 2020-10-16 MED ORDER — DEXAMETHASONE 4 MG PO TABS
4.0000 mg | ORAL_TABLET | Freq: Once | ORAL | Status: AC
  Administered 2020-10-16: 4 mg via ORAL
  Filled 2020-10-16: qty 1

## 2020-10-16 MED ORDER — SODIUM CHLORIDE 0.9 % IV SOLN
Freq: Once | INTRAVENOUS | Status: AC
  Filled 2020-10-16: qty 250

## 2020-10-16 MED ORDER — SODIUM CHLORIDE 0.9% FLUSH
10.0000 mL | INTRAVENOUS | Status: DC | PRN
  Filled 2020-10-16: qty 10

## 2020-10-16 MED ORDER — SODIUM CHLORIDE 0.9 % IV SOLN
80.0000 mg/m2 | Freq: Once | INTRAVENOUS | Status: AC
  Administered 2020-10-16: 126 mg via INTRAVENOUS
  Filled 2020-10-16: qty 21

## 2020-10-16 NOTE — Progress Notes (Signed)
Patient here for treatment check, has not heard from neurologist, also she is having reactions from prednisone can her be decreased

## 2020-10-17 LAB — CA 125: Cancer Antigen (CA) 125: 260 U/mL — ABNORMAL HIGH (ref 0.0–38.1)

## 2020-10-21 ENCOUNTER — Encounter: Payer: Self-pay | Admitting: Nurse Practitioner

## 2020-10-23 ENCOUNTER — Inpatient Hospital Stay: Payer: Self-pay

## 2020-10-23 ENCOUNTER — Other Ambulatory Visit: Payer: Self-pay

## 2020-10-23 ENCOUNTER — Inpatient Hospital Stay: Payer: Self-pay | Attending: Oncology

## 2020-10-23 VITALS — BP 107/72 | HR 83 | Temp 98.0°F | Resp 18 | Wt 117.2 lb

## 2020-10-23 DIAGNOSIS — G620 Drug-induced polyneuropathy: Secondary | ICD-10-CM | POA: Insufficient documentation

## 2020-10-23 DIAGNOSIS — Z5111 Encounter for antineoplastic chemotherapy: Secondary | ICD-10-CM | POA: Insufficient documentation

## 2020-10-23 DIAGNOSIS — C786 Secondary malignant neoplasm of retroperitoneum and peritoneum: Secondary | ICD-10-CM

## 2020-10-23 DIAGNOSIS — C482 Malignant neoplasm of peritoneum, unspecified: Secondary | ICD-10-CM

## 2020-10-23 DIAGNOSIS — C563 Malignant neoplasm of bilateral ovaries: Secondary | ICD-10-CM | POA: Insufficient documentation

## 2020-10-23 DIAGNOSIS — C569 Malignant neoplasm of unspecified ovary: Secondary | ICD-10-CM

## 2020-10-23 LAB — CBC WITH DIFFERENTIAL/PLATELET
Abs Immature Granulocytes: 0.04 10*3/uL (ref 0.00–0.07)
Basophils Absolute: 0 10*3/uL (ref 0.0–0.1)
Basophils Relative: 0 %
Eosinophils Absolute: 0.1 10*3/uL (ref 0.0–0.5)
Eosinophils Relative: 1 %
HCT: 34.6 % — ABNORMAL LOW (ref 36.0–46.0)
Hemoglobin: 11.3 g/dL — ABNORMAL LOW (ref 12.0–15.0)
Immature Granulocytes: 1 %
Lymphocytes Relative: 18 %
Lymphs Abs: 1.2 10*3/uL (ref 0.7–4.0)
MCH: 30.6 pg (ref 26.0–34.0)
MCHC: 32.7 g/dL (ref 30.0–36.0)
MCV: 93.8 fL (ref 80.0–100.0)
Monocytes Absolute: 0.3 10*3/uL (ref 0.1–1.0)
Monocytes Relative: 5 %
Neutro Abs: 4.8 10*3/uL (ref 1.7–7.7)
Neutrophils Relative %: 75 %
Platelets: 198 10*3/uL (ref 150–400)
RBC: 3.69 MIL/uL — ABNORMAL LOW (ref 3.87–5.11)
RDW: 16.1 % — ABNORMAL HIGH (ref 11.5–15.5)
WBC: 6.4 10*3/uL (ref 4.0–10.5)
nRBC: 0 % (ref 0.0–0.2)

## 2020-10-23 LAB — COMPREHENSIVE METABOLIC PANEL
ALT: 14 U/L (ref 0–44)
AST: 20 U/L (ref 15–41)
Albumin: 3.6 g/dL (ref 3.5–5.0)
Alkaline Phosphatase: 78 U/L (ref 38–126)
Anion gap: 9 (ref 5–15)
BUN: 15 mg/dL (ref 8–23)
CO2: 25 mmol/L (ref 22–32)
Calcium: 8.8 mg/dL — ABNORMAL LOW (ref 8.9–10.3)
Chloride: 104 mmol/L (ref 98–111)
Creatinine, Ser: 0.78 mg/dL (ref 0.44–1.00)
GFR, Estimated: >60 mL/min (ref >60)
Glucose, Bld: 106 mg/dL — ABNORMAL HIGH (ref 70–99)
Potassium: 4 mmol/L (ref 3.5–5.1)
Sodium: 138 mmol/L (ref 135–145)
Total Bilirubin: 0.5 mg/dL (ref 0.3–1.2)
Total Protein: 6.7 g/dL (ref 6.5–8.1)

## 2020-10-23 MED ORDER — HEPARIN SOD (PORK) LOCK FLUSH 100 UNIT/ML IV SOLN
500.0000 [IU] | Freq: Once | INTRAVENOUS | Status: AC | PRN
  Administered 2020-10-23: 500 [IU]
  Filled 2020-10-23: qty 5

## 2020-10-23 MED ORDER — DIPHENHYDRAMINE HCL 50 MG/ML IJ SOLN
25.0000 mg | Freq: Once | INTRAMUSCULAR | Status: AC
  Administered 2020-10-23: 25 mg via INTRAVENOUS
  Filled 2020-10-23: qty 1

## 2020-10-23 MED ORDER — SODIUM CHLORIDE 0.9 % IV SOLN
Freq: Once | INTRAVENOUS | Status: AC
  Filled 2020-10-23: qty 250

## 2020-10-23 MED ORDER — SODIUM CHLORIDE 0.9 % IV SOLN: 10.0000 mg | Freq: Once | INTRAVENOUS | Status: DC

## 2020-10-23 MED ORDER — FAMOTIDINE 20 MG IN NS 100 ML IVPB
20.0000 mg | Freq: Once | INTRAVENOUS | Status: AC
  Administered 2020-10-23: 20 mg via INTRAVENOUS
  Filled 2020-10-23: qty 20

## 2020-10-23 MED ORDER — DEXAMETHASONE 4 MG PO TABS
8.0000 mg | ORAL_TABLET | Freq: Once | ORAL | Status: AC
  Administered 2020-10-23: 8 mg via ORAL
  Filled 2020-10-23: qty 2

## 2020-10-23 MED ORDER — PACLITAXEL CHEMO INJECTION 300 MG/50ML
80.0000 mg/m2 | Freq: Once | INTRAVENOUS | Status: AC
  Administered 2020-10-23: 126 mg via INTRAVENOUS
  Filled 2020-10-23: qty 21

## 2020-10-23 MED ORDER — DEXAMETHASONE 4 MG PO TABS: 4.0000 mg | ORAL_TABLET | Freq: Once | ORAL | Status: DC

## 2020-10-25 ENCOUNTER — Inpatient Hospital Stay (HOSPITAL_BASED_OUTPATIENT_CLINIC_OR_DEPARTMENT_OTHER): Payer: Self-pay | Admitting: Internal Medicine

## 2020-10-25 ENCOUNTER — Encounter: Payer: Self-pay | Admitting: Internal Medicine

## 2020-10-25 DIAGNOSIS — G620 Drug-induced polyneuropathy: Secondary | ICD-10-CM

## 2020-10-25 DIAGNOSIS — T451X5A Adverse effect of antineoplastic and immunosuppressive drugs, initial encounter: Secondary | ICD-10-CM

## 2020-10-25 MED ORDER — AMITRIPTYLINE HCL 50 MG PO TABS: 50.0000 mg | ORAL_TABLET | Freq: Every day | ORAL | 3 refills | Status: DC

## 2020-10-25 NOTE — Progress Notes (Signed)
[facility] [place], [place] [address] [phone]   New Patient Evaluation  Date of Service: 10/25/20 Patient Name: Amanda Pena Patient MRN: [id number] Patient DOB: 08/28/1955 Provider: Ventura Sellers, MD  Identifying Statement:  Amanda Pena is a 65 y.o. female with Peripheral neuropathy due to chemotherapy [facility]) who presents for initial consultation and evaluation regarding cancer associated neurologic deficits.    Referring Provider: Plotnikov, [name], MD [address] [place],  [place] [address]  Primary Cancer:  Oncologic History: Oncology History Overview Note  #November 2020-omental caking/peritoneal carcinomatosis; bilateral adnexal masses 4-5 cm in size; multiple lung nodules;   # dec 8th 2020-omental biopsy; positive for high-grade serous adenocarcinoma; GYN origin.  CT scan chest-bilateral lung nodules; 3.8 cm soft tissue mass adjacent to GE junction  # DEC 11TH 2020-CARBO-Taxol [chemo consent] x 3 cycles; February 2021-improvement of the peritoneal carcinomatosis; lung lesions.. 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  Partial colectomy  # #Genetic counseling-s/p NEG myRiskMyriad.   # Thyroid cancer at 45y s/p thyroidectomy [no RAIU]  # NGS/MOLECULAR TESTS: My risk myriad negative; HRD negative; NGS-P  # PALLIATIVE CARE EVALUATION:P  # PAIN MANAGEMENT: P   DIAGNOSIS: Bilateral ovarian cancer/tube/peritoneal  STAGE: IV   ;  GOALS: Control  CURRENT/MOST RECENT THERAPY : Carbotaxol [C]    Peritoneal carcinoma ([place])  06/23/2019 Initial Diagnosis   Peritoneal carcinoma ([place])   06/30/2019 - 11/17/2019 Chemotherapy   The patient had dexamethasone (DECADRON) 4 MG tablet, 8 mg, Oral, Daily, 1 of 1 cycle, Start date: --, End date: -- palonosetron (ALOXI) injection 0.25 mg, 0.25 mg,  Intravenous,  Once, 6 of 6 cycles Administration: 0.25 mg (06/30/2019), 0.25 mg (07/24/2019), 0.25 mg (08/14/2019), 0.25 mg (10/06/2019), 0.25 mg (10/27/2019), 0.25 mg (11/17/2019) pegfilgrastim (NEULASTA ONPRO KIT) injection 6 mg, 6 mg, Subcutaneous, Once, 1 of 1 cycle Administration: 6 mg (11/17/2019) CARBOplatin (PARAPLATIN) 570 mg in sodium chloride 0.9 % 250 mL chemo infusion, 570 mg (100 % of original dose 567 mg), Intravenous,  Once, 6 of 6 cycles Dose modification:   (original dose 567 mg, Cycle 1) Administration: 570 mg (06/30/2019), 570 mg (07/24/2019), 570 mg (08/14/2019), 520 mg (10/27/2019), 520 mg (11/17/2019) fosaprepitant (EMEND) 150 mg in sodium chloride 0.9 % 145 mL IVPB, 150 mg, Intravenous,  Once, 1 of 1 cycle PACLitaxel (TAXOL) 288 mg in sodium chloride 0.9 % 250 mL chemo infusion (> 9m/m2), 175 mg/m2 = 288 mg, Intravenous,  Once, 6 of 6 cycles Administration: 288 mg (06/30/2019), 288 mg (07/24/2019), 288 mg (08/14/2019), 288 mg (10/06/2019), 288 mg (10/27/2019), 288 mg (11/17/2019) fosaprepitant (EMEND) 150 mg, dexamethasone (DECADRON) 12 mg in sodium chloride 0.9 % 145 mL IVPB, , Intravenous,  Once, 5 of 5 cycles Administration:  (07/24/2019),  (08/14/2019),  (10/06/2019),  (10/27/2019),  (11/17/2019)  for chemotherapy treatment.     Genetic Testing   Negative genetic testing. No pathogenic variants identified on the Myriad MyRisk+HRD. The report date is 10/02/2019.  The [facility] panel offered by [employer] sequencing and deletion/duplication testing of the following 35 genes: APC, ATM, AXIN2, BARD1, BMPR1A, BRCA1, BRCA2, BRIP1, CHD1, CDK4, CDKN2A, CHEK2, EPCAM (large rearrangement only), HOXB13, GALNT12, MLH1, MSH2, MSH3, MSH6, MUTYH, NBN, NTHL1, PALB2, PMS2, PTEN, RAD51C, RAD51D, RNF43, RPS20, SMAD4, STK11, and TP53. Sequencing was performed for select regions of POLE and POLD1, and large rearrangement analysis was performed for select regions of  GREM1.    12/19/2019 -  12/19/2019 Chemotherapy   The patient had DOXOrubicin HCL LIPOSOMAL (DOXIL) 64 mg in dextrose 5 % 250 mL chemo infusion, 40 mg/m2, Intravenous,  Once, 0 of 6 cycles bevacizumab-bvzr (ZIRABEV) 500 mg in sodium chloride 0.9 % 100 mL chemo infusion, 10 mg/kg, Intravenous,  Once, 0 of 6 cycles  for chemotherapy treatment.    07/17/2020 -  Chemotherapy    Patient is on Treatment Plan: OVARIAN PACLITAXEL  Q2,5,95,63 + BEVACIZUMAB D1,15 Q28D      Malignant neoplasm of ovary ([place])  06/23/2019 Initial Diagnosis   Malignant neoplasm of ovary ([place])   12/19/2019 - 12/19/2019 Chemotherapy   The patient had DOXOrubicin HCL LIPOSOMAL (DOXIL) 64 mg in dextrose 5 % 250 mL chemo infusion, 40 mg/m2, Intravenous,  Once, 0 of 6 cycles bevacizumab-bvzr (ZIRABEV) 500 mg in sodium chloride 0.9 % 100 mL chemo infusion, 10 mg/kg, Intravenous,  Once, 0 of 6 cycles  for chemotherapy treatment.    07/17/2020 -  Chemotherapy    Patient is on Treatment Plan: OVARIAN PACLITAXEL  O7,5,64,33 + BEVACIZUMAB D1,15 Q28D      07/17/2020 Cancer Staging   Staging form: Ovary, Fallopian Tube, and Primary Peritoneal Carcinoma, AJCC 8th Edition - Clinical: FIGO Stage IVA (pM1a) - Signed by Lloyd Huger, MD on 07/17/2020   Peritoneal carcinomatosis ([place])  12/19/2019 - 12/19/2019 Chemotherapy   The patient had DOXOrubicin HCL LIPOSOMAL (DOXIL) 64 mg in dextrose 5 % 250 mL chemo infusion, 40 mg/m2, Intravenous,  Once, 0 of 6 cycles bevacizumab-bvzr (ZIRABEV) 500 mg in sodium chloride 0.9 % 100 mL chemo infusion, 10 mg/kg, Intravenous,  Once, 0 of 6 cycles  for chemotherapy treatment.    12/19/2019 Initial Diagnosis   Peritoneal carcinomatosis ([place])   07/17/2020 -  Chemotherapy   The patient had PACLitaxel (TAXOL) 126 mg in sodium chloride 0.9 % 250 mL chemo infusion (</= 19m/m2), 80 mg/m2 = 126 mg, Intravenous,  Once, 0 of 4 cycles bevacizumab-bvzr (ZIRABEV) 500 mg in sodium chloride 0.9 % 100 mL chemo infusion, 10 mg/kg = 500  mg, Intravenous,  Once, 0 of 4 cycles  for chemotherapy treatment.    07/17/2020 -  Chemotherapy    Patient is on Treatment Plan: OVARIAN PACLITAXEL  DI9,5,18,84+ BEVACIZUMAB D1,15 Q28D        History of Present Illness: The patient's records from the referring physician were obtained and reviewed and the patient interviewed to confirm this HPI.  Amanda Pena today to review neuropathy symptoms.  She describes months long history of tingling, numbness and burning affecting her feet up to the ankles.  She also describes some balance issues lately, though she is not significantly affected in terms of functional deficit.  She continues to play high level tennis without major issue, only mild imbalance.  Symptoms are more pronounced at night time.  Overall she does not feel deficits are progressing despite ongoing taxol infusions, and there are no symptoms at all in her hands or arms.  Does complain of insomnia, which is chronic.  She does ambien PRN, especially after getting steroids on chemo days.      Medications: Current Outpatient Medications on File Prior to Visit  Medication Sig Dispense Refill  . ALPRAZolam (XANAX) 0.5 MG tablet Take 1 tablet (0.5 mg total) by mouth 2 (two) times daily as needed for anxiety. 60 tablet 2  . Artificial Tear Solution (20/20 ARTIFICIAL TEARS OP) Apply 2 drops to eye daily as needed.     .Marland Kitchen  Ascorbic Acid (VITAMIN C) 1000 MG tablet Take 1,000 mg by mouth in the morning and at bedtime.     . Cholecalciferol (VITAMIN D3) 50 MCG (2000 UT) TABS Take 1 tablet by mouth in the morning and at bedtime.    . DULoxetine (CYMBALTA) 20 MG capsule Take 1 capsule (20 mg total) by mouth daily. (Patient not taking: Reported on 10/16/2020) 30 capsule 2  . gabapentin (NEURONTIN) 300 MG capsule Take 1 capsule (300 mg total) by mouth at bedtime. 30 capsule 1  . lidocaine-prilocaine (EMLA) cream Apply generously to the port area 45-60 mins prior. 30 g 0  . MELATONIN PO  Take 5 mg by mouth daily as needed.     . Omega-3 Fatty Acids (FISH OIL) 1000 MG CAPS Take 2 capsules by mouth daily.    . ondansetron (ZOFRAN) 8 MG tablet One pill every 8 hours as needed for nausea/vomitting. 40 tablet 1  . polyethylene glycol (MIRALAX / GLYCOLAX) 17 g packet Take 17 g by mouth daily as needed.     . Probiotic Product (PROBIOTIC-10) CHEW Chew 1 capsule by mouth.    . prochlorperazine (COMPAZINE) 10 MG tablet Take 1 tablet (10 mg total) by mouth every 6 (six) hours as needed for nausea or vomiting. 40 tablet 1  . promethazine (PHENERGAN) 25 MG tablet Take 1 tablet (25 mg total) by mouth every 6 (six) hours as needed for nausea or vomiting. 30 tablet 0  . senna-docusate (SENOKOT-S) 8.6-50 MG tablet Take by mouth.    . SYNTHROID 75 MCG tablet Take 1 tablet (75 mcg total) by mouth daily before breakfast. 30 tablet 0  . zolpidem (AMBIEN) 10 MG tablet TAKE 1 TABLET BY MOUTH EVERY DAY AT BEDTIME AS NEEDED FOR SLEEP 30 tablet 1  . [DISCONTINUED] rosuvastatin (CRESTOR) 5 MG tablet Take 1 tablet (5 mg total) by mouth daily. 90 tablet 3   No current facility-administered medications on file prior to visit.    Allergies: No Known Allergies Past Medical History:  Past Medical History:  Diagnosis Date  . Cancer ([place])   . Family history of breast cancer   . Family history of lung cancer   . HYPERLIPIDEMIA 04/09/2008   Qualifier: Diagnosis of  By: Wynona Luna   . HYPOTHYROIDISM 04/09/2008   Qualifier: Diagnosis of  By: Wynona Luna   . INSOMNIA, CHRONIC 08/21/2010   Qualifier: Diagnosis of  By: Wynona Luna   . PERSONAL HISTORY MALIGNANT NEOPLASM THYROID 08/21/2010   Qualifier: Diagnosis of  By: Wynona Luna    Past Surgical History:  Past Surgical History:  Procedure Laterality Date  . IR IMAGING GUIDED PORT INSERTION  07/18/2019  . THYROIDECTOMY, PARTIAL     Social History:  Social History   Socioeconomic History  . Marital status: Married    Spouse name:  Not on file  . Number of children: Not on file  . Years of education: Not on file  . Highest education level: Not on file  Occupational History  . Not on file  Tobacco Use  . Smoking status: Never Smoker  . Smokeless tobacco: Never Used  Vaping Use  . Vaping Use: Never used  Substance and Sexual Activity  . Alcohol use: Not Currently  . Drug use: No  . Sexual activity: Yes  Other Topics Concern  . 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 Arthritis Mother   . Hyperlipidemia Mother   . Hyperlipidemia Father   . Diabetes Father   . Hypertension Father   . Heart disease Father 66       CAD  . Lung cancer Father   . Breast cancer Maternal Grandmother        in 70s  . Cancer Maternal Aunt        unk type  . Cancer Maternal Uncle        unk type  . Cancer Paternal Uncle        unk type, d. 56s  . Skin cancer Daughter        basal cell     Review of Systems: Constitutional: Doesn't report fevers, chills or abnormal weight loss Eyes: Doesn't report blurriness of vision Ears, nose, mouth, throat, and face: Doesn't report sore throat Respiratory: Doesn't report cough, dyspnea or wheezes Cardiovascular: Doesn't report palpitation, chest discomfort  Gastrointestinal:  +recent vomiting GU: Doesn't report incontinence Skin: Doesn't report skin rashes Neurological: Per HPI Musculoskeletal: Doesn't report joint pain Behavioral/Psych: Doesn't report anxiety  Physical Exam: Vitals:   10/25/20 1109  BP:  91/64  Pulse: 85  Resp: 20  Temp: 98.4 F (36.9 C)   KPS: 90. General: Alert, cooperative, pleasant, in no acute distress Head: Normal EENT: No conjunctival injection or scleral icterus.  Lungs: Resp effort normal Cardiac: Regular rate Abdomen: Non-distended abdomen Skin: No rashes cyanosis or petechiae. Extremities: No clubbing or edema  Neurologic Exam: Mental Status: Awake, alert, attentive to examiner. Oriented to self and environment. Language is fluent with intact comprehension.  Cranial Nerves: Visual acuity is grossly normal. Visual fields are full. Extra-ocular movements intact. No ptosis. Face is symmetric Motor: Tone and bulk are normal. Power is full in both arms and legs. Reflexes are symmetric, no pathologic reflexes present.  Sensory: Stocking sensory impairment Gait: Normal.   Labs: I have reviewed the data as listed    Component Value Date/Time   NA 138 10/23/2020 0857   K 4.0 10/23/2020 0857   CL 104 10/23/2020 0857   CO2 25 10/23/2020 0857   GLUCOSE 106 (H) 10/23/2020 0857   BUN 15 10/23/2020 0857   CREATININE 0.78 10/23/2020 0857   CREATININE 0.78 03/08/2014 0822   CALCIUM 8.8 (L) 10/23/2020 0857   PROT 6.7 10/23/2020 0857   ALBUMIN 3.6 10/23/2020 0857   AST 20 10/23/2020 0857   ALT 14 10/23/2020 0857   ALKPHOS 78 10/23/2020 0857   BILITOT 0.5 10/23/2020 0857   GFRNONAA >60 10/23/2020 0857   GFRAA >60 01/29/2020 0959   Lab Results  Component Value Date   WBC 6.4 10/23/2020   NEUTROABS 4.8 10/23/2020   HGB 11.3 (L) 10/23/2020   HCT 34.6 (L) 10/23/2020   MCV 93.8 10/23/2020   PLT 198 10/23/2020     Assessment/Plan Peripheral neuropathy due to chemotherapy ([place])  [name] presents with clinical syndrome consistent with symmetric, length dependent, small and large fiber sensory neuropathy.  No motor component is appreciated.  Etiology is exposure to taxol chemotherapy.  She has dosed gabapentin and cymbalta without great  efficacy.    Because of nighttime predominance, issues with insomnia, we recommended a trial of Amytryptiline 9m HS.  We reviewed possible side effects including dry mouth, constipation.  She has already discontinued Cymbalta.  Gabapentin may be continued at 3041mBID if desired, or stopped if patient prefers due to relative lack of efficacy.  Because symptoms are not progressive, no need to hold or dose reduce taxol at this time.  We will follow up with her via phone in 2-3 weeks for further characterization of response to Amytryptiline.       We spent twenty additional minutes teaching regarding the natural history, biology, and historical experience in the treatment of neurologic complications of cancer.   We appreciate the opportunity to participate in the care of SaTuyet Pena  All questions were answered. The patient knows to call the clinic with any problems, questions or concerns. No barriers to learning were detected.  The total time spent in the encounter was 30 minutes and more than 50% was on counseling and review of test results   ZaVentura SellersMD Medical Director of Neuro-Oncology [facility]4/08/22 10:55 AM

## 2020-10-25 NOTE — Progress Notes (Signed)
Patient here today for initial evaluation regarding neuropathy. Patient reports neuropathy in feet, has been on Gabapentin 300 mg daily. Patient reports neuropathy is causing her issues with balance at this time.

## 2020-10-27 ENCOUNTER — Other Ambulatory Visit: Payer: Self-pay

## 2020-10-27 ENCOUNTER — Emergency Department: Payer: Self-pay

## 2020-10-27 ENCOUNTER — Encounter: Payer: Self-pay | Admitting: Emergency Medicine

## 2020-10-27 ENCOUNTER — Inpatient Hospital Stay
Admission: EM | Admit: 2020-10-27 | Discharge: 2020-10-31 | DRG: 389 | Disposition: A | Discharge status: Home / Self Care | Payer: Self-pay | Attending: Internal Medicine | Admitting: Internal Medicine

## 2020-10-27 DIAGNOSIS — R109 Unspecified abdominal pain: Secondary | ICD-10-CM | POA: Diagnosis present

## 2020-10-27 DIAGNOSIS — Z4659 Encounter for fitting and adjustment of other gastrointestinal appliance and device: Secondary | ICD-10-CM

## 2020-10-27 DIAGNOSIS — Z9049 Acquired absence of other specified parts of digestive tract: Secondary | ICD-10-CM

## 2020-10-27 DIAGNOSIS — Z90710 Acquired absence of both cervix and uterus: Secondary | ICD-10-CM

## 2020-10-27 DIAGNOSIS — K565 Intestinal adhesions [bands], unspecified as to partial versus complete obstruction: Principal | ICD-10-CM | POA: Diagnosis present

## 2020-10-27 DIAGNOSIS — Z28311 Partially vaccinated for covid-19: Secondary | ICD-10-CM

## 2020-10-27 DIAGNOSIS — E785 Hyperlipidemia, unspecified: Secondary | ICD-10-CM | POA: Diagnosis present

## 2020-10-27 DIAGNOSIS — Z801 Family history of malignant neoplasm of trachea, bronchus and lung: Secondary | ICD-10-CM

## 2020-10-27 DIAGNOSIS — N179 Acute kidney failure, unspecified: Secondary | ICD-10-CM | POA: Diagnosis present

## 2020-10-27 DIAGNOSIS — Z20822 Contact with and (suspected) exposure to covid-19: Secondary | ICD-10-CM | POA: Diagnosis present

## 2020-10-27 DIAGNOSIS — C563 Malignant neoplasm of bilateral ovaries: Secondary | ICD-10-CM | POA: Diagnosis present

## 2020-10-27 DIAGNOSIS — C569 Malignant neoplasm of unspecified ovary: Secondary | ICD-10-CM | POA: Diagnosis present

## 2020-10-27 DIAGNOSIS — Z28310 Unvaccinated for covid-19: Secondary | ICD-10-CM | POA: Diagnosis present

## 2020-10-27 DIAGNOSIS — Z85850 Personal history of malignant neoplasm of thyroid: Secondary | ICD-10-CM

## 2020-10-27 DIAGNOSIS — K56609 Unspecified intestinal obstruction, unspecified as to partial versus complete obstruction: Secondary | ICD-10-CM | POA: Diagnosis present

## 2020-10-27 DIAGNOSIS — Z90722 Acquired absence of ovaries, bilateral: Secondary | ICD-10-CM

## 2020-10-27 DIAGNOSIS — G620 Drug-induced polyneuropathy: Secondary | ICD-10-CM | POA: Diagnosis present

## 2020-10-27 DIAGNOSIS — Z83438 Family history of other disorder of lipoprotein metabolism and other lipidemia: Secondary | ICD-10-CM

## 2020-10-27 DIAGNOSIS — F419 Anxiety disorder, unspecified: Secondary | ICD-10-CM | POA: Diagnosis present

## 2020-10-27 DIAGNOSIS — E890 Postprocedural hypothyroidism: Secondary | ICD-10-CM | POA: Diagnosis present

## 2020-10-27 DIAGNOSIS — C786 Secondary malignant neoplasm of retroperitoneum and peritoneum: Secondary | ICD-10-CM | POA: Diagnosis present

## 2020-10-27 DIAGNOSIS — T451X5A Adverse effect of antineoplastic and immunosuppressive drugs, initial encounter: Secondary | ICD-10-CM | POA: Diagnosis present

## 2020-10-27 DIAGNOSIS — R059 Cough, unspecified: Secondary | ICD-10-CM | POA: Diagnosis present

## 2020-10-27 DIAGNOSIS — E440 Moderate protein-calorie malnutrition: Secondary | ICD-10-CM | POA: Insufficient documentation

## 2020-10-27 DIAGNOSIS — G4700 Insomnia, unspecified: Secondary | ICD-10-CM | POA: Diagnosis present

## 2020-10-27 DIAGNOSIS — Z66 Do not resuscitate: Secondary | ICD-10-CM | POA: Diagnosis present

## 2020-10-27 DIAGNOSIS — Z808 Family history of malignant neoplasm of other organs or systems: Secondary | ICD-10-CM

## 2020-10-27 DIAGNOSIS — Z515 Encounter for palliative care: Secondary | ICD-10-CM

## 2020-10-27 DIAGNOSIS — F32A Depression, unspecified: Secondary | ICD-10-CM | POA: Diagnosis present

## 2020-10-27 DIAGNOSIS — Z79899 Other long term (current) drug therapy: Secondary | ICD-10-CM

## 2020-10-27 DIAGNOSIS — Z681 Body mass index (BMI) 19 or less, adult: Secondary | ICD-10-CM

## 2020-10-27 DIAGNOSIS — Z803 Family history of malignant neoplasm of breast: Secondary | ICD-10-CM

## 2020-10-27 DIAGNOSIS — E039 Hypothyroidism, unspecified: Secondary | ICD-10-CM | POA: Diagnosis present

## 2020-10-27 DIAGNOSIS — C785 Secondary malignant neoplasm of large intestine and rectum: Secondary | ICD-10-CM | POA: Diagnosis present

## 2020-10-27 DIAGNOSIS — Z2821 Immunization not carried out because of patient refusal: Secondary | ICD-10-CM | POA: Diagnosis present

## 2020-10-27 LAB — URINALYSIS, COMPLETE (UACMP) WITH MICROSCOPIC
Bacteria, UA: NONE SEEN
Bilirubin Urine: NEGATIVE
Glucose, UA: NEGATIVE mg/dL
Hgb urine dipstick: NEGATIVE
Ketones, ur: 80 mg/dL — AB
Leukocytes,Ua: NEGATIVE
Nitrite: NEGATIVE
Protein, ur: 30 mg/dL — AB
Specific Gravity, Urine: >1.046 — ABNORMAL HIGH (ref 1.005–1.030)
pH: 5 (ref 5.0–8.0)

## 2020-10-27 LAB — COMPREHENSIVE METABOLIC PANEL
ALT: 15 U/L (ref 0–44)
AST: 20 U/L (ref 15–41)
Albumin: 3.9 g/dL (ref 3.5–5.0)
Alkaline Phosphatase: 85 U/L (ref 38–126)
Anion gap: 13 (ref 5–15)
BUN: 29 mg/dL — ABNORMAL HIGH (ref 8–23)
CO2: 27 mmol/L (ref 22–32)
Calcium: 9.1 mg/dL (ref 8.9–10.3)
Chloride: 96 mmol/L — ABNORMAL LOW (ref 98–111)
Creatinine, Ser: 1.14 mg/dL — ABNORMAL HIGH (ref 0.44–1.00)
GFR, Estimated: 54 mL/min — ABNORMAL LOW (ref >60)
Glucose, Bld: 123 mg/dL — ABNORMAL HIGH (ref 70–99)
Potassium: 4.1 mmol/L (ref 3.5–5.1)
Sodium: 136 mmol/L (ref 135–145)
Total Bilirubin: 1.6 mg/dL — ABNORMAL HIGH (ref 0.3–1.2)
Total Protein: 7.5 g/dL (ref 6.5–8.1)

## 2020-10-27 LAB — LIPASE, BLOOD: Lipase: 25 U/L (ref 11–51)

## 2020-10-27 LAB — CBC
HCT: 38.7 % (ref 36.0–46.0)
Hemoglobin: 12.7 g/dL (ref 12.0–15.0)
MCH: 30.4 pg (ref 26.0–34.0)
MCHC: 32.8 g/dL (ref 30.0–36.0)
MCV: 92.6 fL (ref 80.0–100.0)
Platelets: 260 10*3/uL (ref 150–400)
RBC: 4.18 MIL/uL (ref 3.87–5.11)
RDW: 16.4 % — ABNORMAL HIGH (ref 11.5–15.5)
WBC: 7 10*3/uL (ref 4.0–10.5)
nRBC: 0 % (ref 0.0–0.2)

## 2020-10-27 LAB — RESP PANEL BY RT-PCR (FLU A&B, COVID) ARPGX2
Influenza A by PCR: NEGATIVE
Influenza B by PCR: NEGATIVE
SARS Coronavirus 2 by RT PCR: NEGATIVE

## 2020-10-27 LAB — LACTIC ACID, PLASMA: Lactic Acid, Venous: 1.7 mmol/L (ref 0.5–1.9)

## 2020-10-27 LAB — TROPONIN I (HIGH SENSITIVITY): Troponin I (High Sensitivity): 11 ng/L (ref <18)

## 2020-10-27 MED ORDER — LORAZEPAM 2 MG/ML IJ SOLN
0.5000 mg | Freq: Two times a day (BID) | INTRAMUSCULAR | Status: AC | PRN
  Administered 2020-10-27 – 2020-10-28 (×3): 0.5 mg via INTRAVENOUS
  Filled 2020-10-27 (×3): qty 1

## 2020-10-27 MED ORDER — MELATONIN 5 MG PO TABS: 5.0000 mg | ORAL_TABLET | Freq: Every evening | ORAL | Status: DC | PRN

## 2020-10-27 MED ORDER — IOHEXOL 300 MG/ML  SOLN
75.0000 mL | Freq: Once | INTRAMUSCULAR | Status: AC | PRN
  Administered 2020-10-27: 75 mL via INTRAVENOUS

## 2020-10-27 MED ORDER — GABAPENTIN 300 MG PO CAPS
300.0000 mg | ORAL_CAPSULE | Freq: Every day | ORAL | Status: DC
  Administered 2020-10-27: 300 mg via ORAL
  Filled 2020-10-27: qty 1

## 2020-10-27 MED ORDER — MORPHINE SULFATE (PF) 2 MG/ML IV SOLN
2.0000 mg | INTRAVENOUS | Status: DC | PRN
  Administered 2020-10-27 (×2): 2 mg via INTRAVENOUS
  Filled 2020-10-27 (×3): qty 1

## 2020-10-27 MED ORDER — SODIUM CHLORIDE 0.9 % IV SOLN
12.5000 mg | Freq: Four times a day (QID) | INTRAVENOUS | Status: AC | PRN
  Administered 2020-10-27: 12.5 mg via INTRAVENOUS
  Filled 2020-10-27 (×2): qty 0.5

## 2020-10-27 MED ORDER — LEVOTHYROXINE SODIUM 50 MCG PO TABS
75.0000 ug | ORAL_TABLET | Freq: Every day | ORAL | Status: DC
  Administered 2020-10-29 – 2020-10-31 (×2): 75 ug via ORAL
  Filled 2020-10-27 (×3): qty 2

## 2020-10-27 MED ORDER — ONDANSETRON HCL 4 MG PO TABS: 4.0000 mg | ORAL_TABLET | Freq: Four times a day (QID) | ORAL | Status: DC | PRN

## 2020-10-27 MED ORDER — ONDANSETRON HCL 4 MG/2ML IJ SOLN: 4.0000 mg | Freq: Four times a day (QID) | INTRAMUSCULAR | Status: DC | PRN

## 2020-10-27 MED ORDER — IOHEXOL 350 MG/ML SOLN: 75.0000 mL | Freq: Once | INTRAVENOUS | Status: DC | PRN

## 2020-10-27 MED ORDER — SODIUM CHLORIDE 0.9 % IV SOLN: INTRAVENOUS | Status: DC

## 2020-10-27 MED ORDER — ONDANSETRON HCL 4 MG/2ML IJ SOLN
4.0000 mg | Freq: Once | INTRAMUSCULAR | Status: AC
  Administered 2020-10-27: 4 mg via INTRAVENOUS
  Filled 2020-10-27: qty 2

## 2020-10-27 MED ORDER — ALPRAZOLAM 0.5 MG PO TABS: 0.5000 mg | ORAL_TABLET | Freq: Two times a day (BID) | ORAL | Status: DC | PRN

## 2020-10-27 MED ORDER — SODIUM CHLORIDE 0.9 % IV BOLUS
1000.0000 mL | Freq: Once | INTRAVENOUS | Status: AC
  Administered 2020-10-27: 1000 mL via INTRAVENOUS

## 2020-10-27 MED ORDER — ACETAMINOPHEN 500 MG PO TABS
1000.0000 mg | ORAL_TABLET | Freq: Four times a day (QID) | ORAL | Status: AC | PRN
  Administered 2020-10-28 (×2): 1000 mg via ORAL
  Filled 2020-10-27 (×2): qty 2

## 2020-10-27 MED ORDER — LACTATED RINGERS IV BOLUS
1000.0000 mL | Freq: Once | INTRAVENOUS | Status: AC
  Administered 2020-10-27: 1000 mL via INTRAVENOUS

## 2020-10-27 MED ORDER — METOCLOPRAMIDE HCL 5 MG/ML IJ SOLN
10.0000 mg | Freq: Once | INTRAMUSCULAR | Status: AC
  Administered 2020-10-27: 10 mg via INTRAVENOUS
  Filled 2020-10-27: qty 2

## 2020-10-27 MED ORDER — MORPHINE SULFATE (PF) 4 MG/ML IV SOLN
4.0000 mg | Freq: Once | INTRAVENOUS | Status: AC
  Administered 2020-10-27: 4 mg via INTRAVENOUS
  Filled 2020-10-27: qty 1

## 2020-10-27 MED ORDER — MORPHINE SULFATE (PF) 4 MG/ML IV SOLN
4.0000 mg | Freq: Once | INTRAVENOUS | Status: DC
Start: 2020-10-27 — End: 2020-10-31
  Filled 2020-10-27: qty 1

## 2020-10-27 MED ORDER — ACETAMINOPHEN 650 MG RE SUPP: 650.0000 mg | Freq: Four times a day (QID) | RECTAL | Status: AC | PRN

## 2020-10-27 MED ORDER — ONDANSETRON HCL 4 MG/2ML IJ SOLN
4.0000 mg | Freq: Four times a day (QID) | INTRAMUSCULAR | Status: DC | PRN
  Administered 2020-10-27 – 2020-10-28 (×2): 4 mg via INTRAVENOUS
  Filled 2020-10-27 (×3): qty 2

## 2020-10-27 NOTE — ED Provider Notes (Signed)
[facility] Emergency Department Provider Note ____________________________________________   Event Date/Time   First MD Initiated Contact with Patient 10/27/20 0913     (approximate)  I have reviewed the triage vital signs and the nursing notes.   HISTORY  Chief Complaint Back Pain, Emesis, and decreased appetite    HPI Amanda Pena is a 65 y.o. female with PMH as noted below including ovarian cancer currently undergoing chemotherapy (last chemo treatment 3 days ago) presents primarily with left flank pain over the last several days, persistent course, associated with some left-sided abdominal pain as well as increased nausea and vomiting, decreased appetite, and generalized weakness.  The patient states that the vomiting and decreased appetite started about a week ago but then worsened after her chemo treatment.  She denies any diarrhea, urinary symptoms, or fever.  Past Medical History:  Diagnosis Date  . Cancer ([place])   . Family history of breast cancer   . Family history of lung cancer   . HYPERLIPIDEMIA 04/09/2008   Qualifier: Diagnosis of  By: Wynona Luna   . HYPOTHYROIDISM 04/09/2008   Qualifier: Diagnosis of  By: Wynona Luna   . INSOMNIA, CHRONIC 08/21/2010   Qualifier: Diagnosis of  By: Wynona Luna   . PERSONAL HISTORY MALIGNANT NEOPLASM THYROID 08/21/2010   Qualifier: Diagnosis of  By: Wynona Luna     Patient Active Problem List   Diagnosis Date Noted  . 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 COVID-19 vaccine series declined 05/28/2020  . Peritoneal carcinomatosis ([place]) 12/19/2019  . Genetic testing 11/16/2019  . Family history of breast cancer   . Family history of lung cancer   . Postoperative pain 10/04/2019  . Other constipation 10/04/2019  . Peritoneal carcinoma ([place]) 06/23/2019  . Malignant neoplasm of ovary ([place]) 06/23/2019  . Abdominal pain 06/13/2019  . LLQ abdominal pain  06/13/2019  . Calcific bursitis of shoulder 01/29/2016  . Cough 10/15/2014  . Cervical pain (neck) 10/15/2014  . Situational anxiety 03/07/2014  . Goals of care, counseling/discussion 02/10/2013  . 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 Preventative health care 11/09/2011  . INSOMNIA, CHRONIC 08/21/2010  . PERSONAL HISTORY MALIGNANT NEOPLASM THYROID 08/21/2010  . Hypothyroidism 04/09/2008  . Hyperlipidemia 04/09/2008  . Sinusitis 04/09/2008    Past Surgical History:  Procedure Laterality Date  . IR IMAGING GUIDED PORT INSERTION  07/18/2019  . THYROIDECTOMY, PARTIAL      Prior to Admission medications   Medication Sig Start Date End Date Taking? Authorizing Provider  ALPRAZolam Duanne Moron) 0.5 MG tablet Take 1 tablet (0.5 mg total) by mouth 2 (two) times daily as needed for anxiety. 03/14/20   Plotnikov, [name], MD  amitriptyline (ELAVIL) 50 MG tablet Take 1 tablet (50 mg total) by mouth at bedtime. 10/25/20   Ventura Sellers, MD  Artificial Tear Solution (20/20 ARTIFICIAL TEARS OP) Apply 2 drops to eye daily as needed.     Plotnikov, [name], MD  Ascorbic Acid (VITAMIN C) 1000 MG tablet Take 1,000 mg by mouth in the morning and at bedtime.     [provider]  Cholecalciferol (VITAMIN D3) 50 MCG (2000 UT) TABS Take 1 tablet by mouth in the morning and at bedtime.    [provider]  gabapentin (NEURONTIN) 300 MG capsule Take 1 capsule (300 mg total) by mouth at bedtime. 10/07/20   Lloyd Huger, MD  lidocaine-prilocaine (EMLA) cream Apply  generously to the port area 45-60 mins prior. 10/16/20   Lloyd Huger, MD  MELATONIN PO Take 5 mg by mouth daily as needed.     Plotnikov, [name], MD  Omega-3 Fatty Acids (FISH OIL) 1000 MG CAPS Take 2 capsules by mouth daily.    [provider]  ondansetron (ZOFRAN) 8 MG tablet One pill every 8 hours as needed for nausea/vomitting. 06/23/19   Cammie Sickle, MD  polyethylene glycol  (MIRALAX / GLYCOLAX) 17 g packet Take 17 g by mouth daily as needed.     [provider]  Probiotic Product (PROBIOTIC-10) CHEW Chew 1 capsule by mouth.    [provider]  prochlorperazine (COMPAZINE) 10 MG tablet Take 1 tablet (10 mg total) by mouth every 6 (six) hours as needed for nausea or vomiting. 06/23/19   Cammie Sickle, MD  promethazine (PHENERGAN) 25 MG tablet Take 1 tablet (25 mg total) by mouth every 6 (six) hours as needed for nausea or vomiting. 07/24/19   Cammie Sickle, MD  senna-docusate (SENOKOT-S) 8.6-50 MG tablet Take by mouth. 09/16/19   [provider]  SYNTHROID 75 MCG tablet Take 1 tablet (75 mcg total) by mouth daily before breakfast. 09/09/20   Plotnikov, [name], MD  zolpidem (AMBIEN) 10 MG tablet TAKE 1 TABLET BY MOUTH EVERY DAY AT BEDTIME AS NEEDED FOR SLEEP 07/17/20   Lloyd Huger, MD  rosuvastatin (CRESTOR) 5 MG tablet Take 1 tablet (5 mg total) by mouth daily. 05/17/19 05/17/19  Plotnikov, [name], MD    Allergies Patient has no known allergies.  Family History  Problem Relation Age of Onset  . Arthritis Mother   . Hyperlipidemia Mother   . Hyperlipidemia Father   . Diabetes Father   . Hypertension Father   . Heart disease Father 57       CAD  . Lung cancer Father   . Breast cancer Maternal Grandmother        in 30s  . Cancer Maternal Aunt        unk type  . Cancer Maternal Uncle        unk type  . Cancer Paternal Uncle        unk type, d. 61s  . Skin cancer Daughter        basal cell     Social History Social History   Tobacco Use  . Smoking status: Never Smoker  . Smokeless tobacco: Never Used  Vaping Use  . Vaping Use: Never used  Substance Use Topics  . Alcohol use: Not Currently  . Drug use: No    Review of Systems  Constitutional: No fever. Eyes: No redness ENT: No sore throat. Cardiovascular: Denies chest pain. Respiratory: Denies shortness of breath. Gastrointestinal:  Positive for nausea and vomiting. Genitourinary: Negative for dysuria.  Positive for flank pain. Musculoskeletal: Positive for left low back pain. Skin: Negative for rash. Neurological: Negative for headache.   ____________________________________________   PHYSICAL EXAM:  VITAL SIGNS: ED Triage Vitals  Enc Vitals Group     BP      Pulse      Resp      Temp      Temp src      SpO2      Weight      Height      Head Circumference      Peak Flow      Pain Score      Pain Loc  Pain Edu?      Excl. in [place]?     Constitutional: Alert and oriented.  Weak appearing in no acute distress. Eyes: Conjunctivae are normal.  Head: Atraumatic. Nose: No congestion/rhinnorhea. Mouth/Throat: Mucous membranes are slightly dry Neck: Normal range of motion.  Cardiovascular: Tachycardic, regular rhythm.  Good peripheral circulation. Respiratory: Normal respiratory effort.  No retractions. Gastrointestinal: Soft with mild left lower quadrant tenderness.  No distention.  Genitourinary: Left CVA tenderness. Musculoskeletal: No lower extremity edema.  Extremities warm and well perfused.  No midline spinal tenderness. Neurologic:  Normal speech and language. No gross focal neurologic deficits are appreciated.  Skin:  Skin is warm and dry. No rash noted. Psychiatric: Mood and affect are normal. Speech and behavior are normal.  ____________________________________________   LABS (all labs ordered are listed, but only abnormal results are displayed)  Labs Reviewed  COMPREHENSIVE METABOLIC PANEL - Abnormal; Notable for the following components:      Result Value   Chloride 96 (*)    Glucose, Bld 123 (*)    BUN 29 (*)    Creatinine, Ser 1.14 (*)    Total Bilirubin 1.6 (*)    GFR, Estimated 54 (*)    All other components within normal limits  CBC - Abnormal; Notable for the following components:   RDW 16.4 (*)    All other components within normal limits  URINALYSIS, COMPLETE (UACMP)  WITH MICROSCOPIC - Abnormal; Notable for the following components:   Color, Urine YELLOW (*)    APPearance HAZY (*)    Specific Gravity, Urine >1.046 (*)    Ketones, ur 80 (*)    Protein, ur 30 (*)    All other components within normal limits  LIPASE, BLOOD  LACTIC ACID, PLASMA  TROPONIN I (HIGH SENSITIVITY)   ____________________________________________  EKG  ED ECG REPORT I, Arta Silence, the attending physician, personally viewed and interpreted this ECG.  Date: 10/27/2020 EKG Time: 0922 Rate: 123 Rhythm: Sinus tachycardia QRS Axis: normal Intervals: LAFB ST/T Wave abnormalities: Nonspecific ST abnormalities Narrative Interpretation: Nonspecific abnormalities with no evidence of acute ischemia  ____________________________________________  RADIOLOGY  CT abdomen/pelvis: Distal small bowel obstruction  ____________________________________________   PROCEDURES  Procedure(s) performed: No  Procedures  Critical Care performed: No ____________________________________________   INITIAL IMPRESSION / ASSESSMENT AND PLAN / ED COURSE  Pertinent labs & imaging results that were available during my care of the patient were reviewed by me and considered in my medical decision making (see chart for details).  65 year old female with PMH as noted above including ovarian cancer presents with left-sided flank and low back pain over the last several days along with worsened nausea, vomiting, decreased appetite, and generalized weakness after receiving chemo several days ago.  However, she reports that the GI symptoms started about a week ago, before the chemo.  I reviewed the past medical records in Epic; the patient has a history of recurrent platinum resistant ovarian cancer and follows with the cancer center here. She is currently being treated with Taxol and Avastin.  On exam currently, the patient is somewhat weak and frail appearing but in no acute distress.  Her  vital signs are normal except for tachycardia to the 120s.  The abdomen is soft with mild tenderness in the left lower quadrant.  She also has some left flank and CVA tenderness.  Exam is otherwise unremarkable.  Differential includes chemotherapy side effects, gastritis, PUD, viral gastroenteritis, colitis, diverticulitis, pyelonephritis, ureteral stone.  We will obtain labs, urinalysis, CT  abdomen/pelvis, and give fluids and analgesia.  ----------------------------------------- 3:13 PM on 10/27/2020 -----------------------------------------  Lab work-up is overall unremarkable.  CT shows likely distal small bowel obstruction.  On reassessment, the patient's pain is well controlled although she is still tachycardic.  I consulted Dr. Celine Ahr from general surgery.  She recommends expectant management and does not recommend an NG tube at this time since the patient is not actively vomiting.  I then consulted Dr. Tobie Poet from the hospitalist service for admission.  ____________________________________________   FINAL CLINICAL IMPRESSION(S) / ED DIAGNOSES  Final diagnoses:  Small bowel obstruction ([place])      NEW MEDICATIONS STARTED DURING THIS VISIT:  New Prescriptions   No medications on file     Note:  This document was prepared using Dragon voice recognition software and may include unintentional dictation errors.   Arta Silence, MD 10/27/20 [phone]

## 2020-10-27 NOTE — ED Notes (Signed)
Pt unable to sign Medical screening exam waiver. Pt was informed that she has the right to medical screening exam since she came to the ED for an emergency medical condition, pt verbalized understanding that if she leaves before seeing an ED provider that she waives the right to a medical screening exam. Pt verbalized her understanding of this and verbalization was witnessed by Tammy, RN and pts husband.

## 2020-10-27 NOTE — ED Triage Notes (Signed)
Pt reports decreased appetite, vomiting and back pain since receiving chemo Wednesday.

## 2020-10-27 NOTE — Progress Notes (Signed)
Patient arrived to the unit, vitals taken, tele placed on patient and verified, IVF infusing, & no acute changes.

## 2020-10-27 NOTE — H&P (Signed)
History and Physical   Amanda Pena XJD:[id number] DOB: Dec 15, 1955 DOA: 10/27/2020  PCP: Cassandria Anger, MD  Outpatient Specialists: Dr. Grayland Ormond, oncology Patient coming from: home   I have personally briefly reviewed patient's old medical records in [place].  Chief Concern: abdominal pain  HPI: Amanda Pena is a 65 y.o. 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  She reports Vomiting and abdominal pain, 10 out of 10, sharp, started 10/19/20.  She did not present to the emergency department quickly because she thought she had a stomach bug.  On 10/24/2020, Thursday night, she developed low back pain.  She reports the pain is 10 out of 10.  The pain became persistent prompting her to present to the emergency department for further evaluation.  She reports her last chemo treatment was on 10/23/20.  She states that she has never felt this way before.  She endorses chills and a new dry cough..  She denies changes to her diet.  She denies fever.  She states her last colonoscopy was about 5 years.  She states that in the last few days she has noticed that she has stopped passing gas.  She states her last bowel movement was explosive watery diarrhea after chemo on 10/23/20. She last had a well formed bowel movement on 10/22/20.   She states the pain has improved with the IV pain medication given to her by the ED provider.  She states that her last PO intake was 8 AM with water for medications.  Social history: lives with spouse. He denies smoking. Infrequently drinks etoh. She is retired and formerly did  office work.   Vaccinatoin: one dose of Moderna vaccine   ROS: Constitutional: no weight change, no fever ENT/Mouth: no sore throat, no rhinorrhea Eyes: no eye pain, no vision changes Cardiovascular: no chest pain, no dyspnea,  no edema, no palpitations Respiratory: + cough, no sputum, no wheezing Gastrointestinal: + nausea, no vomiting, no diarrhea, no constipation, abdominal pain Genitourinary: no urinary incontinence, no dysuria, no hematuria Musculoskeletal: no arthralgias, no myalgias Skin: no skin lesions, no pruritus, Neuro: + weakness, no loss of consciousness, no syncope Psych: no anxiety, no depression, + decrease appetite Heme/Lymph: no bruising, no bleeding  ED Course: Discussed with ED provider, patient requiring hospitalization due to small bowel obstruction.  Vitals in the emergency department was remarkable for temperature of 97.7, respiration rate of 19, blood pressure 93/65, heart rate 116, SPO2 saturation at 97% on room air.  Labs in the emergency department was remarkable for serum creatinine of 1.14, nonfasting blood glucose 123, EGFR of 54, troponin was 11, lactic acid 1.7, UA was done and positive for ketones, proteins, increased specific gravity.  CT of the abdomen and pelvis with contrast revealed interval diffuse small bowel dilatation up to 5 cm diameter with decompressed distal small bowel and terminal ileum.  Discrete transition zone is identified.  Findings consistent with distal small bowel obstruction.  Assessment/Plan  Principal Problem:   SBO (small bowel obstruction) (HCC) Active Problems:   Hypothyroidism   Hyperlipidemia   Cough   Abdominal pain   Malignant neoplasm of ovary (HCC)   Family history of breast cancer  Family history of lung cancer   COVID-19 vaccine series declined   Abdominal pain and back pain secondary to small bowel obstruction -Query small bowel obstruction secondary to adhesions versus colon cancer -Status post normal  saline 1 L bolus per ED provider, morphine 4 mg IV once -Morphine 2 mg IV every 3 hours as needed for severe pain -General surgery, Dr. Celine Ahr has been consulted -Conservative management with N.p.o. except for ice chips and sips with meds, holding on NG tube at this time -Admit to MedSurg, observation, with telemetry -As patient is currently not nauseous or vomiting, will hold on NG tube placement at this time  Mild acute kidney injury secondary to poor p.o. intake -Serum creatinine in the ED was 1.14, GFR 54, increase specific gravity in urine analysis -Baseline serum creatinine is 0.68-0.78, GFR greater than 60 -Status post normal saline 1 L bolus per ED provider -Normal saline 125 mL/h, 1 day ordered  Distal esophagus/esophagogastric junction wall thickening-possible esophagitis versus tumor not excluded -Outpatient follow-up  Anxiety-patient is on alprazolam 0.5 mg p.o. twice daily as needed for anxiety at home -I have ordered Ativan 0.5 mg IV twice daily as needed for anxiety  Hypothyroid-resumed home levothyroxine 75 mcg daily before breakfast  Insomnia-melatonin 5 mg p.o. nightly as needed for sleep  Peripheral neuropathy secondary to chemotherapy-resumed gabapentin 300 mg p.o. daily nightly  Chart reviewed.   DVT prophylaxis: TED hose Code Status: full code  Diet: N.p.o., except for ice chips and sips with meds Family Communication: Updated daughter at bedside Disposition Plan: Pending clinical course Consults called: General surgery Admission status: Observation, MedSurg, with telemetry  Past Medical History:  Diagnosis Date  . Cancer ([place])   . Family history of breast cancer   . Family history of lung cancer   . HYPERLIPIDEMIA 04/09/2008   Qualifier: Diagnosis of  By: Wynona Luna   . HYPOTHYROIDISM 04/09/2008   Qualifier: Diagnosis of  By: Wynona Luna   . INSOMNIA, CHRONIC 08/21/2010   Qualifier: Diagnosis of  By: Wynona Luna   . PERSONAL HISTORY  MALIGNANT NEOPLASM THYROID 08/21/2010   Qualifier: Diagnosis of  By: Wynona Luna    Past Surgical History:  Procedure Laterality Date  . IR IMAGING GUIDED PORT INSERTION  07/18/2019  . THYROIDECTOMY, PARTIAL     Social History:  reports that she has never smoked. She has never used smokeless tobacco. She reports previous alcohol use. She reports that she does not use drugs.  No Known Allergies Family History  Problem Relation Age of Onset  . Arthritis Mother   . Hyperlipidemia Mother   . Hyperlipidemia Father   . Diabetes Father   . Hypertension Father   . Heart disease Father 26       CAD  . Lung cancer Father   . Breast cancer Maternal Grandmother        in 63s  . Cancer Maternal Aunt        unk type  . Cancer Maternal Uncle        unk type  . Cancer Paternal Uncle        unk type, d. 78s  . Skin cancer Daughter        basal cell    Family history: Family history reviewed and not pertinent  Prior to Admission medications   Medication Sig Start Date End Date Taking? Authorizing Provider  ALPRAZolam Duanne Moron) 0.5 MG tablet Take 1 tablet (0.5 mg total) by  mouth 2 (two) times daily as needed for anxiety. 03/14/20   Plotnikov, [name], MD  amitriptyline (ELAVIL) 50 MG tablet Take 1 tablet (50 mg total) by mouth at bedtime. 10/25/20   Ventura Sellers, MD  Artificial Tear Solution (20/20 ARTIFICIAL TEARS OP) Apply 2 drops to eye daily as needed.     Plotnikov, [name], MD  Ascorbic Acid (VITAMIN C) 1000 MG tablet Take 1,000 mg by mouth in the morning and at bedtime.     [provider]  Cholecalciferol (VITAMIN D3) 50 MCG (2000 UT) TABS Take 1 tablet by mouth in the morning and at bedtime.    [provider]  gabapentin (NEURONTIN) 300 MG capsule Take 1 capsule (300 mg total) by mouth at bedtime. 10/07/20   Lloyd Huger, MD  lidocaine-prilocaine (EMLA) cream Apply generously to the port area 45-60 mins prior. 10/16/20   Lloyd Huger, MD   MELATONIN PO Take 5 mg by mouth daily as needed.     Plotnikov, [name], MD  Omega-3 Fatty Acids (FISH OIL) 1000 MG CAPS Take 2 capsules by mouth daily.    [provider]  ondansetron (ZOFRAN) 8 MG tablet One pill every 8 hours as needed for nausea/vomitting. 06/23/19   Cammie Sickle, MD  polyethylene glycol (MIRALAX / GLYCOLAX) 17 g packet Take 17 g by mouth daily as needed.     [provider]  Probiotic Product (PROBIOTIC-10) CHEW Chew 1 capsule by mouth.    [provider]  prochlorperazine (COMPAZINE) 10 MG tablet Take 1 tablet (10 mg total) by mouth every 6 (six) hours as needed for nausea or vomiting. 06/23/19   Cammie Sickle, MD  promethazine (PHENERGAN) 25 MG tablet Take 1 tablet (25 mg total) by mouth every 6 (six) hours as needed for nausea or vomiting. 07/24/19   Cammie Sickle, MD  senna-docusate (SENOKOT-S) 8.6-50 MG tablet Take by mouth. 09/16/19   [provider]  SYNTHROID 75 MCG tablet Take 1 tablet (75 mcg total) by mouth daily before breakfast. 09/09/20   Plotnikov, [name], MD  zolpidem (AMBIEN) 10 MG tablet TAKE 1 TABLET BY MOUTH EVERY DAY AT BEDTIME AS NEEDED FOR SLEEP 07/17/20   Lloyd Huger, MD  rosuvastatin (CRESTOR) 5 MG tablet Take 1 tablet (5 mg total) by mouth daily. 05/17/19 05/17/19  Plotnikov, [name], MD   Physical Exam: Vitals:   10/27/20 1600 10/27/20 1615 10/27/20 1716 10/27/20 1839  BP: 112/83 116/80 123/78   Pulse: (!) 120 (!) 119 (!) 110   Resp: (!) 21 (!) 21 18   Temp:   100 F (37.8 C) 99 F (37.2 C)  TempSrc:   Oral Oral  SpO2: 95% 95% 99%   Weight:      Height:       Constitutional: appears age-appropriate, frail, cachectic, weak, NAD, calm Eyes: PERRL, lids and conjunctivae normal ENMT: Mucous membranes are moist. Posterior pharynx clear of any exudate or lesions. Age-appropriate dentition. Hearing appropriate Neck: normal, supple, no masses, no thyromegaly Respiratory:  clear to auscultation bilaterally, no wheezing, no crackles. Normal respiratory effort. No accessory muscle use.  Cardiovascular: Regular rate and rhythm, no murmurs / rubs / gallops. No extremity edema. 2+ pedal pulses. No carotid bruits.  Abdomen: Generalized diffuse abdominal tenderness and worse with deep palpation, no masses palpated, no hepatosplenomegaly. Bowel sounds positive.  Musculoskeletal: no clubbing / cyanosis. No joint deformity upper and lower extremities. Good ROM, no contractures, no atrophy. Normal  muscle tone.  Skin: no rashes, lesions, ulcers. No induration Neurologic: Sensation intact. Strength 5/5 in all 4.  Psychiatric: Normal judgment and insight. Alert and oriented x 3. Normal mood.   EKG: independently reviewed, showing sinus tachycardia with rate of 123, QTc 461  Imaging on Admission: I personally reviewed and I agree with radiologist reading as below.  CT ABDOMEN PELVIS W CONTRAST  Result Date: 10/27/2020 CLINICAL DATA:  Abdominal pain vomiting.  History of ovarian cancer. EXAM: CT ABDOMEN AND PELVIS WITH CONTRAST TECHNIQUE: Multidetector CT imaging of the abdomen and pelvis was performed using the standard protocol following bolus administration of intravenous contrast. CONTRAST:  25m OMNIPAQUE IOHEXOL 300 MG/ML  SOLN COMPARISON:  PET-CT 09/12/2020.  Abdomen/pelvis CT 12/01/2019 FINDINGS: Lower chest: Fluid is identified around the distal esophagus. Hepatobiliary: Periportal edema noted. New 3.5 x 3.4 cm heterogeneous lesion is identified in the left liver near the junction of the medial and lateral segments. There is a new heterogeneous lesion in the posterior right liver measuring 2.2 cm on image 21/2. Gallbladder unremarkable. No intrahepatic or extrahepatic biliary dilation. Pancreas: No focal mass lesion. No dilatation of the main duct. No intraparenchymal cyst. No peripancreatic edema. Spleen: No splenomegaly. No focal mass lesion. Adrenals/Urinary Tract: No  adrenal nodule or mass. 8 mm probable cyst upper pole right kidney is similar to prior. Left kidney unremarkable. No evidence for hydroureter. The urinary bladder appears normal for the degree of distention. Stomach/Bowel: Possible wall thickening at the distal esophagus/esophagogastric junction. Stomach otherwise unremarkable. Duodenum is normally positioned as is the ligament of Treitz. Diffuse small bowel dilatation measures up to 5 cm diameter in the right pelvis (image 57/2). A discrete transition zone is not identified, but distal small bowel and terminal ileum are decompressed. Terminal ileum is visible on coronal image 45/3. Colon is nondilated. Rectal anastomosis evident. Vascular/Lymphatic: There is abdominal aortic atherosclerosis without aneurysm. Calcified retroperitoneal nodes are similar to PET-CT from 6 weeks ago. Portal vein and superior mesenteric vein are patent. Splenic vein is patent. No pelvic sidewall lymphadenopathy. Reproductive: Uterus surgically absent.  There is no adnexal mass. Other: Large volume intraperitoneal free fluid is similar to 09/12/2020. Small mixed attenuation left perirectal nodule is stable. No discrete measurable peritoneal nodule is evident. Musculoskeletal: No worrisome lytic or sclerotic osseous abnormality. IMPRESSION: 1. Interval development of diffuse small bowel dilatation up to 5 cm diameter with decompressed distal small bowel and terminal ileum. A discrete transition zone is not identified, but findings are compatible with distal small bowel obstruction. 2. Left hepatic lobe lesion is progressive since PET-CT 6 weeks ago, demonstrated to be hypermetabolic and consistent with peritoneal disease. There is a new lesion in the posterior right liver compatible with metastatic deposit. 3. Large volume intraperitoneal free fluid is similar to 09/12/2020. As before, fluid generates mass-effect on the liver capsule. 4. Possible wall thickening at the distal  esophagus/esophagogastric junction. Esophagitis would be a consideration although tumor involvement not excluded. 5. Stable small mixed attenuation left perirectal nodule. 6. Aortic Atherosclerosis (ICD10-I70.0). Electronically Signed   By: EMisty StanleyM.D.   On: 10/27/2020 13:31   Labs on Admission: I have personally reviewed following labs  CBC: Recent Labs  Lab 10/23/20 0857 10/27/20 0942  WBC 6.4 7.0  NEUTROABS 4.8  --   HGB 11.3* 12.7  HCT 34.6* 38.7  MCV 93.8 92.6  PLT 198 2119  Basic Metabolic Panel: Recent Labs  Lab 10/23/20 0857 10/27/20 0942  NA 138 136  K 4.0 4.1  CL 104 96*  CO2 25 27  GLUCOSE 106* 123*  BUN 15 29*  CREATININE 0.78 1.14*  CALCIUM 8.8* 9.1   GFR: Estimated Creatinine Clearance: 42.2 mL/min (A) (by C-G formula based on SCr of 1.14 mg/dL (H)). Liver Function Tests: Recent Labs  Lab 10/23/20 0857 10/27/20 0942  AST 20 20  ALT 14 15  ALKPHOS 78 85  BILITOT 0.5 1.6*  PROT 6.7 7.5  ALBUMIN 3.6 3.9   Recent Labs  Lab 10/27/20 0942  LIPASE 25   Urine analysis:    Component Value Date/Time   COLORURINE YELLOW (A) 10/27/2020 1255   APPEARANCEUR HAZY (A) 10/27/2020 1255   LABSPEC >1.046 (H) 10/27/2020 1255   PHURINE 5.0 10/27/2020 [place] 10/27/2020 [place] 06/13/2019 1517   HGBUR NEGATIVE 10/27/2020 1255   [place] 10/27/2020 1255   KETONESUR 80 (A) 10/27/2020 1255   PROTEINUR 30 (A) 10/27/2020 1255   UROBILINOGEN 1.0 06/13/2019 1517   NITRITE NEGATIVE 10/27/2020 1255   LEUKOCYTESUR NEGATIVE 10/27/2020 1255   A. Jorian [place]hoite, D.O. [facility]  If 7PM-7AM, please contact overnight-coverage provider If 7AM-7PM, please contact day coverage provider [email or web address]  10/27/2020, 7:32 PM

## 2020-10-28 ENCOUNTER — Observation Stay: Payer: Self-pay

## 2020-10-28 ENCOUNTER — Encounter: Payer: Self-pay | Admitting: Obstetrics and Gynecology

## 2020-10-28 ENCOUNTER — Encounter: Payer: Self-pay | Admitting: Internal Medicine

## 2020-10-28 ENCOUNTER — Encounter: Payer: Self-pay | Admitting: Oncology

## 2020-10-28 DIAGNOSIS — R1084 Generalized abdominal pain: Secondary | ICD-10-CM

## 2020-10-28 DIAGNOSIS — R112 Nausea with vomiting, unspecified: Secondary | ICD-10-CM

## 2020-10-28 DIAGNOSIS — Z8543 Personal history of malignant neoplasm of ovary: Secondary | ICD-10-CM

## 2020-10-28 DIAGNOSIS — Z8542 Personal history of malignant neoplasm of other parts of uterus: Secondary | ICD-10-CM

## 2020-10-28 DIAGNOSIS — K565 Intestinal adhesions [bands], unspecified as to partial versus complete obstruction: Secondary | ICD-10-CM | POA: Diagnosis present

## 2020-10-28 DIAGNOSIS — R933 Abnormal findings on diagnostic imaging of other parts of digestive tract: Secondary | ICD-10-CM

## 2020-10-28 DIAGNOSIS — E440 Moderate protein-calorie malnutrition: Secondary | ICD-10-CM | POA: Insufficient documentation

## 2020-10-28 DIAGNOSIS — Z515 Encounter for palliative care: Secondary | ICD-10-CM

## 2020-10-28 DIAGNOSIS — Z9289 Personal history of other medical treatment: Secondary | ICD-10-CM

## 2020-10-28 DIAGNOSIS — K56609 Unspecified intestinal obstruction, unspecified as to partial versus complete obstruction: Secondary | ICD-10-CM

## 2020-10-28 DIAGNOSIS — R140 Abdominal distension (gaseous): Secondary | ICD-10-CM

## 2020-10-28 DIAGNOSIS — Z8509 Personal history of malignant neoplasm of other digestive organs: Secondary | ICD-10-CM

## 2020-10-28 LAB — CBC
HCT: 34.4 % — ABNORMAL LOW (ref 36.0–46.0)
Hemoglobin: 11.2 g/dL — ABNORMAL LOW (ref 12.0–15.0)
MCH: 30.6 pg (ref 26.0–34.0)
MCHC: 32.6 g/dL (ref 30.0–36.0)
MCV: 94 fL (ref 80.0–100.0)
Platelets: 228 10*3/uL (ref 150–400)
RBC: 3.66 MIL/uL — ABNORMAL LOW (ref 3.87–5.11)
RDW: 16.5 % — ABNORMAL HIGH (ref 11.5–15.5)
WBC: 6.4 10*3/uL (ref 4.0–10.5)
nRBC: 0 % (ref 0.0–0.2)

## 2020-10-28 LAB — BASIC METABOLIC PANEL
Anion gap: 10 (ref 5–15)
BUN: 33 mg/dL — ABNORMAL HIGH (ref 8–23)
CO2: 26 mmol/L (ref 22–32)
Calcium: 8.4 mg/dL — ABNORMAL LOW (ref 8.9–10.3)
Chloride: 102 mmol/L (ref 98–111)
Creatinine, Ser: 0.87 mg/dL (ref 0.44–1.00)
GFR, Estimated: >60 mL/min (ref >60)
Glucose, Bld: 116 mg/dL — ABNORMAL HIGH (ref 70–99)
Potassium: 4 mmol/L (ref 3.5–5.1)
Sodium: 138 mmol/L (ref 135–145)

## 2020-10-28 LAB — HIV ANTIBODY (ROUTINE TESTING W REFLEX): HIV Screen 4th Generation wRfx: NONREACTIVE

## 2020-10-28 MED ORDER — SODIUM CHLORIDE 0.9 % IV SOLN
8.0000 mg | Freq: Three times a day (TID) | INTRAVENOUS | Status: DC
  Administered 2020-10-28 – 2020-10-29 (×3): 8 mg via INTRAVENOUS
  Filled 2020-10-28 (×11): qty 4

## 2020-10-28 MED ORDER — PROCHLORPERAZINE EDISYLATE 10 MG/2ML IJ SOLN
10.0000 mg | Freq: Once | INTRAMUSCULAR | Status: AC
  Administered 2020-10-28: 10 mg via INTRAVENOUS
  Filled 2020-10-28: qty 2

## 2020-10-28 MED ORDER — MORPHINE SULFATE (PF) 2 MG/ML IV SOLN
2.0000 mg | INTRAVENOUS | Status: DC | PRN
  Administered 2020-10-28: 2 mg via INTRAVENOUS
  Filled 2020-10-28 (×2): qty 1

## 2020-10-28 MED ORDER — CHLORHEXIDINE GLUCONATE CLOTH 2 % EX PADS
6.0000 | MEDICATED_PAD | Freq: Every day | CUTANEOUS | Status: DC
  Administered 2020-10-28 – 2020-10-30 (×3): 6 via TOPICAL

## 2020-10-28 MED ORDER — DEXAMETHASONE SODIUM PHOSPHATE 4 MG/ML IJ SOLN
4.0000 mg | Freq: Two times a day (BID) | INTRAMUSCULAR | Status: DC
Start: 2020-10-28 — End: 2020-10-31
  Administered 2020-10-28 – 2020-10-30 (×5): 4 mg via INTRAVENOUS
  Filled 2020-10-28 (×7): qty 1

## 2020-10-28 MED ORDER — OCTREOTIDE ACETATE 100 MCG/ML IJ SOLN
100.0000 ug | Freq: Three times a day (TID) | INTRAMUSCULAR | Status: DC
  Administered 2020-10-28 (×2): 100 ug via SUBCUTANEOUS
  Filled 2020-10-28 (×5): qty 1

## 2020-10-28 NOTE — TOC Initial Note (Signed)
Transition of Care [facility]) - Initial/Assessment Note    Patient Details  Name: Amanda Pena MRN: [id number] Date of Birth: [birth year]  [facility]) CM/SW Contact:    Beverly Sessions, RN Phone Number: 10/28/2020, 1:46 PM  Clinical Narrative:                  Patient listed as self pay.   Reached out to Financial counseling to request case be assigned to Med assist.   Stefanie Libel at [phone] with Med assist confirmed case as of yet has not been assigned        Patient Goals and CMS Choice        Expected Discharge Plan and Services                                                Prior Living Arrangements/Services                       Activities of Daily Living Home Assistive Devices/Equipment: None ADL Screening (condition at time of admission) Patient's cognitive ability adequate to safely complete daily activities?: Yes Is the patient deaf or have difficulty hearing?: No Does the patient have difficulty seeing, even when wearing glasses/contacts?: No Does the patient have difficulty concentrating, remembering, or making decisions?: No Patient able to express need for assistance with ADLs?: No Does the patient have difficulty dressing or bathing?: No Independently performs ADLs?: Yes (appropriate for developmental age) Does the patient have difficulty walking or climbing stairs?: No Weakness of Legs: Both Weakness of Arms/Hands: None  Permission Sought/Granted                  Emotional Assessment              Admission diagnosis:  Small bowel obstruction ([place]) [K56.609] SBO (small bowel obstruction) ([place]) [K56.609] Patient Active Problem List   Diagnosis Date Noted  . SBO (small bowel obstruction) ([place]) 10/27/2020  . Peripheral neuropathy due to chemotherapy ([place]) 10/25/2020  . COVID-19 vaccine series declined 05/28/2020  . Peritoneal carcinomatosis ([place]) 12/19/2019  . Genetic testing 11/16/2019  .  Family history of breast cancer   . Family history of lung cancer   . Postoperative pain 10/04/2019  . Other constipation 10/04/2019  . Peritoneal carcinoma ([place]) 06/23/2019  . Malignant neoplasm of ovary ([place]) 06/23/2019  . Abdominal pain 06/13/2019  . LLQ abdominal pain 06/13/2019  . Calcific bursitis of shoulder 01/29/2016  . Cough 10/15/2014  . Cervical pain (neck) 10/15/2014  . Situational anxiety 03/07/2014  . Goals of care, counseling/discussion 02/10/2013  . 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 Preventative health care 11/09/2011  . INSOMNIA, CHRONIC 08/21/2010  . PERSONAL HISTORY MALIGNANT NEOPLASM THYROID 08/21/2010  . Hypothyroidism 04/09/2008  . Hyperlipidemia 04/09/2008  . Sinusitis 04/09/2008   PCP:  Cassandria Anger, MD Pharmacy:   [id number] - [place], [place] - [address] [address] Phone: [phone] Fax: [phone]     Social Determinants of Health (SDOH) Interventions    Readmission Risk Interventions No flowsheet data found.

## 2020-10-28 NOTE — Consult Note (Addendum)
[place]  [phone] [phone]   Name: Amanda Pena Date: 10/28/2020 MRN: [id number]  DOB: 03-Jun-1956  Patient Care Team: Cassandria Anger, MD as PCP - General (Internal Medicine) Arvella Nigh, MD as Consulting Physician (Obstetrics and Gynecology) Clent Jacks, RN as Oncology Nurse Navigator    REASON FOR CONSULTATION: Amanda Pena is a 65 y.o. 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  PMH also notable for CIPN, history of thyroid cancer status post thyroidectomy, anxiety, depression, insomnia, and hyperlipidemia.  Patient is followed locally by [facility]. She is currently on third line Taxol/Avastin.    Patient was admitted to the hospital 10/27/20 with intractable nausea, vomiting and abdominal pain.  CT of the abdomen and pelvis was consistent with SBO and progressive liver metastasis and worsening peritoneal disease.  Patient was referred to palliative care to help address goals and manage ongoing symptoms..   SOCIAL HISTORY:     reports that she has never smoked. She has never used smokeless tobacco. She reports previous alcohol use. She reports that she does not use drugs.  Patient is married and lives at home with her husband.  She has a daughter.  Patient is active and still plays tennis socially.  ADVANCE DIRECTIVES:  None on file  CODE STATUS: DNR  PAST MEDICAL HISTORY: Past Medical History:  Diagnosis Date  . Cancer ([place])   . Family history of breast cancer   . Family history of lung cancer   . HYPERLIPIDEMIA 04/09/2008   Qualifier: Diagnosis of  By: Wynona Luna   . HYPOTHYROIDISM 04/09/2008   Qualifier: Diagnosis of  By: Wynona Luna   . INSOMNIA, CHRONIC 08/21/2010   Qualifier: Diagnosis of  By: Wynona Luna   . PERSONAL HISTORY MALIGNANT NEOPLASM THYROID 08/21/2010   Qualifier: Diagnosis of  By: Wynona Luna     PAST SURGICAL HISTORY:  Past Surgical History:  Procedure Laterality Date  . IR IMAGING GUIDED PORT INSERTION  07/18/2019  . THYROIDECTOMY, PARTIAL      HEMATOLOGY/ONCOLOGY HISTORY:  Oncology History Overview Note  #November 2020-omental caking/peritoneal carcinomatosis; bilateral adnexal masses 4-5 cm in size; multiple lung nodules;   # dec 8th 2020-omental biopsy; positive for high-grade serous adenocarcinoma; GYN origin.  CT scan chest-bilateral lung nodules; 3.8 cm soft tissue mass adjacent to GE junction  # DEC 11TH 2020-CARBO-Taxol [chemo consent] x 3 cycles; February 2021-improvement of the peritoneal carcinomatosis; lung lesions.. 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  Partial colectomy  # #Genetic counseling-s/p NEG myRiskMyriad.   # Thyroid cancer at 88y s/p thyroidectomy [no RAIU]  # NGS/MOLECULAR TESTS: My risk myriad negative; HRD negative; NGS-P  # PALLIATIVE CARE EVALUATION:P  # PAIN MANAGEMENT: P   DIAGNOSIS: Bilateral ovarian cancer/tube/peritoneal  STAGE: IV   ;  GOALS: Control  CURRENT/MOST RECENT THERAPY : Carbotaxol [C]    Peritoneal carcinoma ([place])  06/23/2019 Initial Diagnosis   Peritoneal carcinoma ([place])   06/30/2019 - 11/17/2019 Chemotherapy   The patient had dexamethasone (DECADRON) 4 MG tablet, 8 mg, Oral, Daily, 1 of 1 cycle, Start date: --, End date: -- palonosetron (ALOXI) injection 0.25 mg, 0.25 mg, Intravenous,  Once, 6 of 6  cycles Administration: 0.25 mg (06/30/2019), 0.25 mg (07/24/2019), 0.25 mg (08/14/2019), 0.25 mg (10/06/2019), 0.25 mg (10/27/2019), 0.25 mg (11/17/2019) pegfilgrastim (NEULASTA ONPRO KIT) injection 6 mg, 6 mg, Subcutaneous, Once, 1  of 1 cycle Administration: 6 mg (11/17/2019) CARBOplatin (PARAPLATIN) 570 mg in sodium chloride 0.9 % 250 mL chemo infusion, 570 mg (100 % of original dose 567 mg), Intravenous,  Once, 6 of 6 cycles Dose modification:   (original dose 567 mg, Cycle 1) Administration: 570 mg (06/30/2019), 570 mg (07/24/2019), 570 mg (08/14/2019), 520 mg (10/27/2019), 520 mg (11/17/2019) fosaprepitant (EMEND) 150 mg in sodium chloride 0.9 % 145 mL IVPB, 150 mg, Intravenous,  Once, 1 of 1 cycle PACLitaxel (TAXOL) 288 mg in sodium chloride 0.9 % 250 mL chemo infusion (> $RemoveBef'80mg'vKmEIxkaIA$ /m2), 175 mg/m2 = 288 mg, Intravenous,  Once, 6 of 6 cycles Administration: 288 mg (06/30/2019), 288 mg (07/24/2019), 288 mg (08/14/2019), 288 mg (10/06/2019), 288 mg (10/27/2019), 288 mg (11/17/2019) fosaprepitant (EMEND) 150 mg, dexamethasone (DECADRON) 12 mg in sodium chloride 0.9 % 145 mL IVPB, , Intravenous,  Once, 5 of 5 cycles Administration:  (07/24/2019),  (08/14/2019),  (10/06/2019),  (10/27/2019),  (11/17/2019)  for chemotherapy treatment.     Genetic Testing   Negative genetic testing. No pathogenic variants identified on the My[employer] MyRisk+HRD. The report date is 10/02/2019.  The [facility] gene panel offered by [employer] includes sequencing and deletion/duplication testing of the following 35 genes: APC, ATM, AXIN2, BARD1, BMPR1A, BRCA1, BRCA2, BRIP1, CHD1, CDK4, CDKN2A, CHEK2, EPCAM (large rearrangement only), HOXB13, GALNT12, MLH1, MSH2, MSH3, MSH6, MUTYH, NBN, NTHL1, PALB2, PMS2, PTEN, RAD51C, RAD51D, RNF43, RPS20, SMAD4, STK11, and TP53. Sequencing was performed for select regions of POLE and POLD1, and large rearrangement analysis was performed for select regions of GREM1.    12/19/2019 - 12/19/2019 Chemotherapy   The patient had DOXOrubicin HCL LIPOSOMAL (DOXIL) 64 mg in dextrose 5 % 250 mL chemo infusion, 40 mg/m2, Intravenous,  Once, 0 of 6 cycles bevacizumab-bvzr (ZIRABEV) 500 mg in sodium chloride 0.9 % 100 mL chemo infusion, 10 mg/kg,  Intravenous,  Once, 0 of 6 cycles  for chemotherapy treatment.    07/17/2020 -  Chemotherapy    Patient is on Treatment Plan: OVARIAN PACLITAXEL  T6,1,44,31 + BEVACIZUMAB D1,15 Q28D      Malignant neoplasm of ovary ([place])  06/23/2019 Initial Diagnosis   Malignant neoplasm of ovary ([place])   12/19/2019 - 12/19/2019 Chemotherapy   The patient had DOXOrubicin HCL LIPOSOMAL (DOXIL) 64 mg in dextrose 5 % 250 mL chemo infusion, 40 mg/m2, Intravenous,  Once, 0 of 6 cycles bevacizumab-bvzr (ZIRABEV) 500 mg in sodium chloride 0.9 % 100 mL chemo infusion, 10 mg/kg, Intravenous,  Once, 0 of 6 cycles  for chemotherapy treatment.    07/17/2020 -  Chemotherapy    Patient is on Treatment Plan: OVARIAN PACLITAXEL  V4,0,08,67 + BEVACIZUMAB D1,15 Q28D      07/17/2020 Cancer Staging   Staging form: Ovary, Fallopian Tube, and Primary Peritoneal Carcinoma, AJCC 8th Edition - Clinical: FIGO Stage IVA (pM1a) - Signed by Lloyd Huger, MD on 07/17/2020   Peritoneal carcinomatosis ([place])  12/19/2019 - 12/19/2019 Chemotherapy   The patient had DOXOrubicin HCL LIPOSOMAL (DOXIL) 64 mg in dextrose 5 % 250 mL chemo infusion, 40 mg/m2, Intravenous,  Once, 0 of 6 cycles bevacizumab-bvzr (ZIRABEV) 500 mg in sodium chloride 0.9 % 100 mL chemo infusion, 10 mg/kg, Intravenous,  Once, 0 of 6 cycles  for chemotherapy treatment.    12/19/2019 Initial Diagnosis  Peritoneal carcinomatosis ([place])   07/17/2020 -  Chemotherapy   The patient had PACLitaxel (TAXOL) 126 mg in sodium chloride 0.9 % 250 mL chemo infusion (</= $RemoveBefor'80mg'HrcdcsQXnHww$ /m2), 80 mg/m2 = 126 mg, Intravenous,  Once, 0 of 4 cycles bevacizumab-bvzr (ZIRABEV) 500 mg in sodium chloride 0.9 % 100 mL chemo infusion, 10 mg/kg = 500 mg, Intravenous,  Once, 0 of 4 cycles  for chemotherapy treatment.    07/17/2020 -  Chemotherapy    Patient is on Treatment Plan: OVARIAN PACLITAXEL  B7,6,28,31 + BEVACIZUMAB D1,15 Q28D        ALLERGIES:  has No Known Allergies.  MEDICATIONS:   Current Facility-Administered Medications  Medication Dose Route Frequency Provider Last Rate Last Admin  . 0.9 %  sodium chloride infusion   Intravenous Continuous Barb Merino, MD 125 mL/hr at 10/28/20 1126 New Bag at 10/28/20 1126  . acetaminophen (TYLENOL) tablet 1,000 mg  1,000 mg Oral Q6H PRN Cox, Amy N, DO   1,000 mg at 10/28/20 0348   Or  . acetaminophen (TYLENOL) suppository 650 mg  650 mg Rectal Q6H PRN Cox, Amy N, DO      . Chlorhexidine Gluconate Cloth 2 % PADS 6 each  6 each Topical Daily Barb Merino, MD   6 each at 10/28/20 0932  . gabapentin (NEURONTIN) capsule 300 mg  300 mg Oral QHS Cox, Amy N, DO   300 mg at 10/27/20 2040  . levothyroxine (SYNTHROID) tablet 75 mcg  75 mcg Oral QAC breakfast Cox, Amy N, DO      . LORazepam (ATIVAN) injection 0.5 mg  0.5 mg Intravenous BID PRN Cox, Amy N, DO   0.5 mg at 10/28/20 1124  . melatonin tablet 5 mg  5 mg Oral QHS PRN Cox, Amy N, DO      . morphine 2 MG/ML injection 2 mg  2 mg Intravenous Q3H PRN Barb Merino, MD   2 mg at 10/28/20 1431  . morphine 4 MG/ML injection 4 mg  4 mg Intravenous Once Cox, Amy N, DO      . ondansetron (ZOFRAN) tablet 4 mg  4 mg Oral Q6H PRN Cox, Amy N, DO       Or  . ondansetron (ZOFRAN) injection 4 mg  4 mg Intravenous Q6H PRN Cox, Amy N, DO   4 mg at 10/28/20 0932  . promethazine (PHENERGAN) 12.5 mg in sodium chloride 0.9 % 50 mL IVPB  12.5 mg Intravenous Q6H PRN Cox, Amy N, DO 200 mL/hr at 10/27/20 2226 12.5 mg at 10/27/20 2226    VITAL SIGNS: BP 127/70 (BP Location: Left Arm)   Pulse 92   Temp 98.9 F (37.2 C) (Oral)   Resp 15   Ht $R'5\' 6"'Wg$  (1.676 m)   Wt 115 lb (52.2 kg)   SpO2 99%   BMI 18.56 kg/m  Filed Weights   10/27/20 0915  Weight: 115 lb (52.2 kg)    Estimated body mass index is 18.56 kg/m as calculated from the following:   Height as of this encounter: $RemoveBeforeD'5\' 6"'vMhbStDdUFmleE$  (1.676 m).   Weight as of this encounter: 115 lb (52.2 kg).  LABS: CBC:    Component Value Date/Time   WBC 6.4  10/28/2020 0420   HGB 11.2 (L) 10/28/2020 0420   HCT 34.4 (L) 10/28/2020 0420   PLT 228 10/28/2020 0420   MCV 94.0 10/28/2020 0420   NEUTROABS 4.8 10/23/2020 0857   LYMPHSABS 1.2 10/23/2020 0857   MONOABS 0.3 10/23/2020 0857   EOSABS 0.1  10/23/2020 0857   BASOSABS 0.0 10/23/2020 0857   Comprehensive Metabolic Panel:    Component Value Date/Time   NA 138 10/28/2020 0420   K 4.0 10/28/2020 0420   CL 102 10/28/2020 0420   CO2 26 10/28/2020 0420   BUN 33 (H) 10/28/2020 0420   CREATININE 0.87 10/28/2020 0420   CREATININE 0.78 03/08/2014 0822   GLUCOSE 116 (H) 10/28/2020 0420   CALCIUM 8.4 (L) 10/28/2020 0420   AST 20 10/27/2020 0942   ALT 15 10/27/2020 0942   ALKPHOS 85 10/27/2020 0942   BILITOT 1.6 (H) 10/27/2020 0942   PROT 7.5 10/27/2020 0942   ALBUMIN 3.9 10/27/2020 0942    RADIOGRAPHIC STUDIES: CT ABDOMEN PELVIS W CONTRAST  Result Date: 10/27/2020 CLINICAL DATA:  Abdominal pain vomiting.  History of ovarian cancer. EXAM: CT ABDOMEN AND PELVIS WITH CONTRAST TECHNIQUE: Multidetector CT imaging of the abdomen and pelvis was performed using the standard protocol following bolus administration of intravenous contrast. CONTRAST:  9mL OMNIPAQUE IOHEXOL 300 MG/ML  SOLN COMPARISON:  PET-CT 09/12/2020.  Abdomen/pelvis CT 12/01/2019 FINDINGS: Lower chest: Fluid is identified around the distal esophagus. Hepatobiliary: Periportal edema noted. New 3.5 x 3.4 cm heterogeneous lesion is identified in the left liver near the junction of the medial and lateral segments. There is a new heterogeneous lesion in the posterior right liver measuring 2.2 cm on image 21/2. Gallbladder unremarkable. No intrahepatic or extrahepatic biliary dilation. Pancreas: No focal mass lesion. No dilatation of the main duct. No intraparenchymal cyst. No peripancreatic edema. Spleen: No splenomegaly. No focal mass lesion. Adrenals/Urinary Tract: No adrenal nodule or mass. 8 mm probable cyst upper pole right kidney is  similar to prior. Left kidney unremarkable. No evidence for hydroureter. The urinary bladder appears normal for the degree of distention. Stomach/Bowel: Possible wall thickening at the distal esophagus/esophagogastric junction. Stomach otherwise unremarkable. Duodenum is normally positioned as is the ligament of Treitz. Diffuse small bowel dilatation measures up to 5 cm diameter in the right pelvis (image 57/2). A discrete transition zone is not identified, but distal small bowel and terminal ileum are decompressed. Terminal ileum is visible on coronal image 45/3. Colon is nondilated. Rectal anastomosis evident. Vascular/Lymphatic: There is abdominal aortic atherosclerosis without aneurysm. Calcified retroperitoneal nodes are similar to PET-CT from 6 weeks ago. Portal vein and superior mesenteric vein are patent. Splenic vein is patent. No pelvic sidewall lymphadenopathy. Reproductive: Uterus surgically absent.  There is no adnexal mass. Other: Large volume intraperitoneal free fluid is similar to 09/12/2020. Small mixed attenuation left perirectal nodule is stable. No discrete measurable peritoneal nodule is evident. Musculoskeletal: No worrisome lytic or sclerotic osseous abnormality. IMPRESSION: 1. Interval development of diffuse small bowel dilatation up to 5 cm diameter with decompressed distal small bowel and terminal ileum. A discrete transition zone is not identified, but findings are compatible with distal small bowel obstruction. 2. Left hepatic lobe lesion is progressive since PET-CT 6 weeks ago, demonstrated to be hypermetabolic and consistent with peritoneal disease. There is a new lesion in the posterior right liver compatible with metastatic deposit. 3. Large volume intraperitoneal free fluid is similar to 09/12/2020. As before, fluid generates mass-effect on the liver capsule. 4. Possible wall thickening at the distal esophagus/esophagogastric junction. Esophagitis would be a consideration although  tumor involvement not excluded. 5. Stable small mixed attenuation left perirectal nodule. 6. Aortic Atherosclerosis (ICD10-I70.0). Electronically Signed   By: Misty Stanley M.D.   On: 10/27/2020 13:31   DG Abd Portable 1V  Result Date: 10/28/2020 CLINICAL  DATA:  Nasogastric tube placement EXAM: PORTABLE ABDOMEN - 1 VIEW COMPARISON:  10/02/2019 FINDINGS: Nasogastric tube tip overlies the proximal body of the stomach. The visualized abdominal gas pattern is normal. Pelvis excluded from view. IMPRESSION: Nasogastric tube tip overlying the proximal body of the stomach. Electronically Signed   By: Fidela Salisbury MD   On: 10/28/2020 05:58    PERFORMANCE STATUS (ECOG) : 2 - Symptomatic, <50% confined to bed  Review of Systems Unless otherwise noted, a complete review of systems is negative.  Physical Exam General: NAD HEENT: NGT Pulmonary: Unlabored Extremities: no edema, no joint deformities Skin: no rashes Neurological: Weakness but otherwise nonfocal  IMPRESSION: I met with patient and husband to discuss goals.  Both verbalized an understanding of patient's current clinical status.  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  She has been evaluated by surgery who recommended conservative management.  Patient is not felt to be a viable surgical candidate.  She has also been seen by medical oncology and is felt to have limited options for future treatment even if there is clinical recovery.  Symptomatically, she initially had improvement with NGT to suction but has had recurrent nausea and vomiting this afternoon.  She has some back pain for which she has been receiving as needed morphine.  Case discussed with medical oncology, hospitalist, and surgical team.  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  Will start on octreotide and dexamethasone.  We will schedule ondansetron.  If no clinical improvement, could consider scheduled  metoclopramide and haloperidol.  Venting PEG could be considered for gastric decompression if goal is comfort due to no clinical improvement.  I discussed the option of hospice at length with patient.  She says that she would likely lean towards hospice IPU if needed for symptom management and due to care limitations of her husband who has had a previous stroke.  We discussed CODE STATUS.  Patient stated clearly and repeatedly that she would not want to be resuscitated nor have her life prolonged artificially on machines.  Both she and husband were in agreement with DNR/DNI.  PLAN: -Continue current scope of treatment -Start octreotide 100 mcg SQ 3 times daily -Start dexamethasone 4 mg IV every 12 hours -Ondansetron 8 mg IV every 8 hours -Continue NGT to suction -Consider metoclopramide and haloperidol for malignant bowel obstruction if no clinical improvement -Consider venting PEG for comfort if no clinical improvement -Morphine as needed for pain -DNR/DNI  Case and plan discussed with Drs. Nobie Putnam, and Dwight  Time Total: 75 minutes  Visit consisted of counseling and education dealing with the complex and emotionally intense issues of symptom management and palliative care in the setting of serious and potentially life-threatening illness.Greater than 50%  of this time was spent counseling and coordinating care related to the above assessment and plan.  Signed by: Altha Harm, PhD, NP-C

## 2020-10-28 NOTE — Progress Notes (Signed)
Initial Nutrition Assessment  DOCUMENTATION CODES:   Non-severe (moderate) malnutrition in context of chronic illness  INTERVENTION:   RD will monitor for GOC   Pt at high refeed risk  NUTRITION DIAGNOSIS:   Moderate Malnutrition related to cancer and cancer related treatments as evidenced by mild fat depletion,moderate muscle depletion,severe muscle depletion.  GOAL:   Patient will meet greater than or equal to 90% of their needs  MONITOR:   Diet advancement,Labs,Weight trends,Skin,I & O's  REASON FOR ASSESSMENT:   Malnutrition Screening Tool    ASSESSMENT:   65 y.o. female with medical history significant for peripheral neuropathy due to chemotherapy, high-grade serous adenocarcinoma of the ovaries with metastasis to the omentum, s/p ex lap with TAH BSO, omentectomy, rectosigmoid colectomy and re-anastamosis, argon beam laser to the diaphragm, resection of small portion of transverse colon mesentery and optimal debulking to R0 for high grade serous ovarian cancer 09/12/19, thyroid cancer s/p thyroidectomy, anxiety, depression, insomnia and hyperlipidemia who is admitted with SBO  Met with pt in room today. Pt reports poor appetite and oral intake for 4-5 days pta but reports that before that, she was eating ok. Pt has also been drinking Ensure supplements at home. Pt remains NPO in hospital. Pt reports continued nausea and abdominal pain today. NGT in place with 2534m output. Per chart, pt appears weight stable for the past year. Palliative care following for GOC. RD will monitor for diet advancement vs the need for nutrition support vs GOC. Pt is at high refeed risk.   Medications reviewed and include: dexamethasone, synthroid, octroetide, NaCl @125ml /hr, zofran, phenergan  Labs reviewed: K 4.0 wnl, BUN 33(H)   NUTRITION - FOCUSED PHYSICAL EXAM:  Flowsheet Row Most Recent Value  Orbital Region No depletion  Upper Arm Region Mild depletion  Thoracic and Lumbar Region  Mild depletion  Buccal Region Mild depletion  Temple Region Mild depletion  Clavicle Bone Region Moderate depletion  Clavicle and Acromion Bone Region Moderate depletion  Scapular Bone Region Moderate depletion  Dorsal Hand Mild depletion  Patellar Region Severe depletion  Anterior Thigh Region Severe depletion  Posterior Calf Region Severe depletion  Edema (RD Assessment) None  Hair Reviewed  Eyes Reviewed  Mouth Reviewed  Skin Reviewed  Nails Reviewed     Diet Order:   Diet Order            Diet NPO time specified Except for: Ice Chips, Sips with Meds  Diet effective now                EDUCATION NEEDS:   No education needs have been identified at this time  Skin:  Skin Assessment: Reviewed RN Assessment (wound Left heel)  Last BM:  PTA  Height:   Ht Readings from Last 1 Encounters:  10/27/20 5' 6"  (1.676 m)    Weight:   Wt Readings from Last 1 Encounters:  10/27/20 52.2 kg    Ideal Body Weight:  59 kg  BMI:  Body mass index is 18.56 kg/m.  Estimated Nutritional Needs:   Kcal:  1700-1900kcal/day  Protein:  85-95g/day  Fluid:  1.6-1.8L/day  CKoleen DistanceMS, RD, LDN Please refer to [facility]for RD and/or RD on-call/weekend/after hours pager

## 2020-10-28 NOTE — Consult Note (Addendum)
[facility] SURGICAL CONSULTATION NOTE (initial) - cpt: 99245   HISTORY OF PRESENT ILLNESS (HPI):  65 y.o. female presented to [facility] ED yesterday for evaluation of abdominal pain. Patient reports a >1 week history of generalized abdominal pain, nausea, and emesis. Initially, she thought maybe she had a "stomach bug" but this continued throughout the week. She does have a significant oncologic history and is currently undergoing chemotherapy. Her last treatment was on 04/06. Since that time, she reports that she has stopped passing gas. No fever, chills, CP, SOB, or urinary changes. 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 Work up in the ED revealed a mild AKI with sCr - 1.14 but laboratory work was otherwise reassuring. She did have CT Abdomen/Pelvis which was concerning for small bowel obstruction. She was admitted to medicine service and had NGT placed. Output in the last 24 hours recorded at 2.5L.   Surgery is consulted by emergency medicine physician Dr. Arta Silence, MD in this context for evaluation and management of SBO.  PAST MEDICAL HISTORY (PMH):  Past Medical History:  Diagnosis Date   Cancer [facility])    Family history of breast cancer    Family history of lung cancer    HYPERLIPIDEMIA 04/09/2008   Qualifier: Diagnosis of  By: Wynona Luna    HYPOTHYROIDISM 04/09/2008   Qualifier: Diagnosis of  By: Yoo DO, [place], CHRONIC 08/21/2010   Qualifier: Diagnosis of  By: Wynona Luna    PERSONAL HISTORY MALIGNANT NEOPLASM THYROID 08/21/2010   Qualifier: Diagnosis of  By: Wynona Luna      PAST SURGICAL HISTORY ([place]):  Past Surgical History:  Procedure Laterality Date   IR IMAGING GUIDED PORT INSERTION  07/18/2019    THYROIDECTOMY, PARTIAL       MEDICATIONS:  Prior to Admission medications   Medication Sig Start Date End Date Taking? Authorizing Provider  ALPRAZolam Duanne Moron) 0.5 MG tablet Take 1 tablet (0.5 mg total) by mouth 2 (two) times daily as needed for anxiety. 03/14/20  Yes Plotnikov, [name], MD  Ascorbic Acid (VITAMIN C) 1000 MG tablet Take 1,000 mg by mouth in the morning and at bedtime.    Yes [provider]  Cholecalciferol (VITAMIN D3) 50 MCG (2000 UT) TABS Take 1 tablet by mouth in the morning and at bedtime.   Yes [provider]  gabapentin (NEURONTIN) 300 MG capsule Take 1 capsule (300 mg total) by mouth at bedtime. 10/07/20  Yes Lloyd Huger, MD  MELATONIN PO Take 5 mg by mouth daily as needed.    Yes Plotnikov, [name], MD  Omega-3 Fatty Acids (FISH OIL) 1000 MG CAPS Take 2 capsules by mouth daily.   Yes [provider]  ondansetron (ZOFRAN) 8 MG tablet One pill every 8 hours as needed for nausea/vomitting. 06/23/19  Yes Cammie Sickle, MD  polyethylene glycol (MIRALAX / GLYCOLAX) 17 g packet Take 17 g by mouth daily as needed.    Yes [provider]  Probiotic Product (PROBIOTIC-10) CHEW Chew 1 capsule by mouth.   Yes [provider]  SYNTHROID 75 MCG tablet Take 1 tablet (75 mcg total) by mouth daily before breakfast. 09/09/20  Yes Plotnikov, [name], MD  zolpidem (AMBIEN) 10 MG tablet  TAKE 1 TABLET BY MOUTH EVERY DAY AT BEDTIME AS NEEDED FOR SLEEP 07/17/20  Yes Lloyd Huger, MD  amitriptyline (ELAVIL) 50 MG tablet Take 1 tablet (50 mg total) by mouth at bedtime. 10/25/20   Ventura Sellers, MD  Artificial Tear Solution (20/20 ARTIFICIAL TEARS OP) Apply 2 drops to eye daily as needed.     Plotnikov, [name], MD  lidocaine-prilocaine (EMLA) cream Apply generously to the port area 45-60 mins prior. 10/16/20   Lloyd Huger, MD  prochlorperazine (COMPAZINE) 10 MG tablet Take 1 tablet (10 mg total) by mouth every 6  (six) hours as needed for nausea or vomiting. Patient not taking: Reported on 10/27/2020 06/23/19   Cammie Sickle, MD  promethazine (PHENERGAN) 25 MG tablet Take 1 tablet (25 mg total) by mouth every 6 (six) hours as needed for nausea or vomiting. Patient not taking: Reported on 10/27/2020 07/24/19   Cammie Sickle, MD  senna-docusate (SENOKOT-S) 8.6-50 MG tablet Take by mouth. Patient not taking: Reported on 10/27/2020 09/16/19   [provider]  rosuvastatin (CRESTOR) 5 MG tablet Take 1 tablet (5 mg total) by mouth daily. 05/17/19 05/17/19  Plotnikov, [name], MD     ALLERGIES:  No Known Allergies   SOCIAL HISTORY:  Social History   Socioeconomic History   Marital status: Married    Spouse name: Not on file   Number of children: Not on file   Years of education: Not on file   Highest education level: Not on file  Occupational History   Not on file  Tobacco Use   Smoking status: Never Smoker   Smokeless tobacco: Never Used  Vaping Use   Vaping Use: Never used  Substance and Sexual Activity   Alcohol use: Not Currently   Drug use: No   Sexual activity: Yes  Other Topics Concern   Not on file  Social History Narrative   Lives in [place]; with husband; daughter in [place]; never smoked; rare alcohol; 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   Social Determinants of Health   Financial Resource Strain: Not on file  Food Insecurity: Not on file  Transportation Needs: Not on file  Physical Activity: Not on file  Stress: Not on file  Social Connections: Not on file  Intimate Partner Violence: Not on file     FAMILY HISTORY:  Family History  Problem Relation Age of Onset   Arthritis Mother    Hyperlipidemia Mother    Hyperlipidemia Father    Diabetes Father    Hypertension Father    Heart disease Father 43       CAD   Lung cancer Father    Breast cancer Maternal Grandmother        in 26s   Cancer Maternal Aunt        unk type    Cancer Maternal Uncle        unk type   Cancer Paternal Uncle        unk type, d. 7s   Skin cancer Daughter        basal cell       REVIEW OF SYSTEMS:  Review of Systems  Constitutional: Negative for chills and fever.  HENT: Negative for congestion and sore throat.   Respiratory: Positive for cough. Negative for shortness of breath.   Cardiovascular: Negative for chest pain and palpitations.  Gastrointestinal: Positive for abdominal pain, nausea and vomiting. Negative for constipation and diarrhea.  Genitourinary: Negative for  dysuria and urgency.  All other systems reviewed and are negative.   VITAL SIGNS:  Temp:  [97.7 F (36.5 C)-100 F (37.8 C)] 98.3 F (36.8 C) (04/11 0504) Pulse Rate:  [100-126] 109 (04/11 0504) Resp:  [13-28] 19 (04/11 0504) BP: (93-123)/(33-87) 119/71 (04/11 0504) SpO2:  [93 %-100 %] 95 % (04/11 0504) Weight:  [52.2 kg] 52.2 kg (04/10 0915)     Height: 5\' 6"  (167.6 cm) Weight: 52.2 kg BMI (Calculated): 18.57   INTAKE/OUTPUT:  04/10 0701 - 04/11 0700 In: 1269.9 [I.V.:85.4; IV Piggyback:1184.5] Out: 2700 [Urine:200; Emesis/NG output:2500]  PHYSICAL EXAM:  Physical Exam Vitals and nursing note reviewed. Exam conducted with a chaperone present.  Constitutional:      General: She is not in acute distress.    Appearance: Normal appearance. She is normal weight. She is not ill-appearing.     Comments: Somewhat frail appearing female, resting comfortably  HENT:     Head: Normocephalic and atraumatic.     Nose:     Comments: NGT in place; output bilious     Mouth/Throat:     Mouth: Mucous membranes are dry.     Pharynx: Oropharynx is clear.  Eyes:     General: No scleral icterus.    Conjunctiva/sclera: Conjunctivae normal.  Cardiovascular:     Rate and Rhythm: Normal rate and regular rhythm.     Pulses: Normal pulses.  Pulmonary:     Effort: Pulmonary effort is normal. No respiratory distress.  Abdominal:     General: Abdomen is flat. A  surgical scar is present. There is distension.     Palpations: Abdomen is soft.     Tenderness: There is no abdominal tenderness. There is no guarding or rebound.     Comments: Abdomen is soft, non-tender this morning, she remains mildly distended and tympanic, no rebound/guarding, she does have midline laparotomy scar seen   Genitourinary:    Comments: deferred Musculoskeletal:     Right lower leg: No edema.     Left lower leg: No edema.  Skin:    General: Skin is warm and dry.     Coloration: Skin is not pale.     Findings: No erythema.  Neurological:     General: No focal deficit present.     Mental Status: She is alert and oriented to person, place, and time.  Psychiatric:        Mood and Affect: Mood normal.        Behavior: Behavior normal.      Labs:  CBC Latest Ref Rng & Units 10/28/2020 10/27/2020 10/23/2020  WBC 4.0 - 10.5 K/uL 6.4 7.0 6.4  Hemoglobin 12.0 - 15.0 g/dL 11.2(L) 12.7 11.3(L)  Hematocrit 36.0 - 46.0 % 34.4(L) 38.7 34.6(L)  Platelets 150 - 400 K/uL 228 260 198   CMP Latest Ref Rng & Units 10/28/2020 10/27/2020 10/23/2020  Glucose 70 - 99 mg/dL 116(H) 123(H) 106(H)  BUN 8 - 23 mg/dL 33(H) 29(H) 15  Creatinine 0.44 - 1.00 mg/dL 0.87 1.14(H) 0.78  Sodium 135 - 145 mmol/L 138 136 138  Potassium 3.5 - 5.1 mmol/L 4.0 4.1 4.0  Chloride 98 - 111 mmol/L 102 96(L) 104  CO2 22 - 32 mmol/L 26 27 25   Calcium 8.9 - 10.3 mg/dL 8.4(L) 9.1 8.8(L)  Total Protein 6.5 - 8.1 g/dL - 7.5 6.7  Total Bilirubin 0.3 - 1.2 mg/dL - 1.6(H) 0.5  Alkaline Phos 38 - 126 U/L - 85 78  AST 15 - 41 U/L -  20 20  ALT 0 - 44 U/L - 15 14     Imaging studies:   CT Abdomen/Pelvis (10/27/2020) personally reviewed with marked small bowel dilation although contrast seems to reach colon, significant intra-peritoneal fluid, no free air, and radiologist report reviewed below:  IMPRESSION: 1. Interval development of diffuse small bowel dilatation up to 5 cm diameter with decompressed distal small  bowel and terminal ileum. A discrete transition zone is not identified, but findings are compatible with distal small bowel obstruction. 2. Left hepatic lobe lesion is progressive since PET-CT 6 weeks ago, demonstrated to be hypermetabolic and consistent with peritoneal disease. There is a new lesion in the posterior right liver compatible with metastatic deposit. 3. Large volume intraperitoneal free fluid is similar to 09/12/2020. As before, fluid generates mass-effect on the liver capsule. 4. Possible wall thickening at the distal esophagus/esophagogastric junction. Esophagitis would be a consideration although tumor involvement not excluded. 5. Stable small mixed attenuation left perirectal nodule. 6. Aortic Atherosclerosis (ICD10-I70.0).   Assessment/Plan: (ICD-10's: K60.609) 65 y.o. female with abdominal pain/distension, nausea, and emesis found to have significant dilation of small bowel concerning for small bowel obstruction, potentially secondary to post-surgical adhesions although peritoneal carcinomatosis/malignancy progression would be most concerning especially with her oncologic history and CT findings.    - Agree with placement of NGT for decompression; monitor and record output  - No emergent surgical intervention; patient would like Dr Theora Gianotti (her gyn-onc) notified of her admission/condition  - Monitor abdominal examination; on-going bowel function  - Pain control prn (minimize narcotics); antiemetics prn   - Serial KUBs as needed  - Mobilization encouraged as tolerated  - Further management per primary service   All of the above findings and recommendations were discussed with the patient, and all of patient's questions were answered to her expressed satisfaction.  Thank you for the opportunity to participate in this patient's care.   Face-to-face time spent with the patient and care providers was 80 minutes, with more than 50% of the time spent counseling, educating,  and coordinating care of the patient  -- Edison Simon, PA-C [facility] 10/28/2020, 7:11 AM [phone] M-F: 7am - 4pm  I spoke with Dr. Theora Gianotti this morning regarding Ms. Amanda Pena.  The patient's daughter had contacted her earlier and expressed an interest in possible transfer to [facility].  If this is feasible, there is certainly no objection to making the transfer.  Dr. Theora Gianotti and I discussed the likelihood that this represents disease progression and that surgical intervention is not really a viable option.  Strongly recommend that palliative care be consulted to discuss goals of care.  Suggest asking Dr. Grayland Ormond to see her, as well.  We will continue to follow, but do not anticipate aggressive action. Among possible palliative options would be paracentesis to alleviate compressive effects of ascites and decompressive G-tube for comfort measures at home.  I saw and evaluated the patient.  I agree with the above documentation, exam, and plan, which I have edited where appropriate. Fredirick Maudlin  11:07 AM

## 2020-10-28 NOTE — Progress Notes (Incomplete)
Pt. O&AX4. Pt. Continue to receive medication for N/V. Pt. vomit X 2, bile/green in appearance. NG tube was ordered and placed. 14 french NG tube was confirmed with xray. Pt. Tolerating well. 250 ml output with intermittent suctioning. Pt. Resting. Will continue to monitor. Pt. Remains NPO

## 2020-10-28 NOTE — Progress Notes (Signed)
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 In the emergency room, she was found to have distal small bowel obstruction thought to be from cancer.  Admitted with surgical consultation.   Assessment & Plan:   Principal Problem:   SBO (small bowel obstruction) (HCC) Active Problems:   Hypothyroidism   Hyperlipidemia   Cough   Abdominal pain   Malignant neoplasm of ovary (HCC)   Family history of breast cancer   Family history of lung cancer   COVID-19 vaccine series declined  Small bowel obstruction from adhesions/suspect malignant obstruction: N.p.o. IV fluids, adequate pain medications, NG tube to intermittent drain. Seen by surgery and they discussed with her gynecology oncology.  They suspect this is malignant obstruction and patient may not have surgical options.  Discussions ongoing. Will notify her oncologist.  Will consult palliative care to start discussion and coordination of care. Continue all symptomatic treatment. If surgery or patient request transfer to other higher centers, will make transfer request.  Metastatic ovarian cancer: Currently on chemotherapy.  Last dose was on 4/6.  Will notify her oncologist.  Patient probably unable to tolerate further treatment.  Acute kidney injury secondary to prerenal injury: Treated with IV fluids.  Continue on maintenance IV fluids.  Fairly stable.  Peripheral neuropathy: On gabapentin.  Will give with clamping NG tube.  Hypothyroidism: On thyroxine.  If unable to take by mouth  by tomorrow, will give IV thyroxine.  May keep oral medications and clamp NG tube for 30 minutes.   I meet with patient and her daughter at the bedside.  They had just discussed case with surgery and were very anxious and tearful with the diagnosis.  Provided support.   DVT prophylaxis: Place TED hose Start: 10/27/20 1536   Code Status: Full code Family Communication: Daughter at the bedside Disposition Plan: Status is: Observation  The patient will require care spanning > 2 midnights and should be moved to inpatient because: IV treatments appropriate due to intensity of illness or inability to take PO and Inpatient level of care appropriate due to severity of illness  Dispo: The patient is from: Home              Anticipated d/c is to: Home              Patient currently is not medically stable to d/c.   Difficult to place patient No  Consultants:   General surgery  Palliative medicine  Oncology    Procedures:   None  Antimicrobials:   None   Subjective: Patient seen and examined.  Early morning hours, she was having problem with leaking NG tube.  Had minimal abdominal pain.  She had some nausea even with the NG tube in.  No flatus or bowel movements for the last 5 days.  Abdominal bloating slightly improved after NG tube decompression. Met with patient and her daughter at the bedside again.  They were anxious and tearful after talking to surgery.  Objective: Vitals:   10/27/20 1942 10/28/20 0504 10/28/20 0930 10/28/20 1112  BP: 100/78 119/71 116/78 127/70  Pulse: 100 (!) 109 (!) 114  92  Resp: _0 Temp: 98 F (36.7 C) 98.3 F (36.8 C) 97.9 F (36.6 C) 98.9 F (37.2 C)  TempSrc: Oral  Oral Oral  SpO2: 98% 95% 99% 99%  Weight:      Height:        Intake/Output Summary (Last 24 hours) at 10/28/2020 1130 Last data filed at 10/28/2020 0932 Gross per 24 hour  Intake 269.91 ml  Output 2850 ml  Net -2580.09 ml   Filed Weights   10/27/20 0915   Weight: 52.2 kg    Examination: General exam: Appears calm and comfortable , frail and debilitated, chronically sick looking.  Respiratory system: Clear to auscultation. Respiratory effort normal. Cardiovascular system: S1 & S2 heard, RRR.  Gastrointestinal system: Abdomen soft, distended but no residual guarding.  Generalized abdominal pain. Bowel sounds absent. Central nervous system: Alert and oriented. No focal neurological deficits. Extremities: Symmetric 5 x 5 power. Skin: No rashes, lesions or ulcers Psychiatry: Judgement and insight appear normal. Mood & affect anxious.    Data Reviewed: I have personally reviewed following labs and imaging studies  CBC: Recent Labs  Lab 10/23/20 0857 10/27/20 0942 10/28/20 0420  WBC 6.4 7.0 6.4  NEUTROABS 4.8  --   --   HGB 11.3* 12.7 11.2*  HCT 34.6* 38.7 34.4*  MCV 93.8 92.6 94.0  PLT 198 260 496   Basic Metabolic Panel: Recent Labs  Lab 10/23/20 0857 10/27/20 0942 10/28/20 0420  NA 138 136 138  K 4.0 4.1 4.0  CL 104 96* 102  CO2 _1 GLUCOSE 106* 123* 116*  BUN 15 29* 33*  CREATININE 0.78 1.14* 0.87  CALCIUM 8.8* 9.1 8.4*   GFR: Estimated Creatinine Clearance: 55.3 mL/min (by C-G formula based on SCr of 0.87 mg/dL). Liver Function Tests: Recent Labs  Lab 10/23/20 0857 10/27/20 0942  AST 20 20  ALT 14 15  ALKPHOS 78 85  BILITOT 0.5 1.6*  PROT 6.7 7.5  ALBUMIN 3.6 3.9   Recent Labs  Lab 10/27/20 0942  LIPASE 25   No results for input(s): AMMONIA in the last 168 hours. Coagulation Profile: No results for input(s): INR, PROTIME in the last 168 hours. Cardiac Enzymes: No results for input(s): CKTOTAL, CKMB, CKMBINDEX, TROPONINI in the last 168 hours. BNP (last 3 results) No results for input(s): PROBNP in the last 8760 hours. HbA1C: No results for input(s): HGBA1C in the last 72 hours. CBG: No results for input(s): GLUCAP in the last 168 hours. Lipid Profile: No results for input(s): CHOL,  HDL, LDLCALC, TRIG, CHOLHDL, LDLDIRECT in the last 72 hours. Thyroid Function Tests: No results for input(s): TSH, T4TOTAL, FREET4, T3FREE, THYROIDAB in the last 72 hours. Anemia Panel: No results for input(s): VITAMINB12, FOLATE, FERRITIN, TIBC, IRON, RETICCTPCT in the last 72 hours. Sepsis Labs: Recent Labs  Lab 10/27/20 7591  LATICACIDVEN 1.7    Recent Results (from the past 240 hour(s))  Resp Panel by RT-PCR (Flu A&B, Covid) Nasopharyngeal Swab     Status: None   Collection Time: 10/27/20  3:35 PM   Specimen: Nasopharyngeal Swab; Nasopharyngeal(NP) swabs in vial transport medium  Result Value Ref Range Status   SARS Coronavirus 2 by RT PCR NEGATIVE NEGATIVE Final    Comment: (NOTE) SARS-CoV-2 target nucleic acids are NOT DETECTED.  The SARS-CoV-2 RNA is generally detectable in upper respiratory specimens during the acute phase of infection. The lowest concentration of SARS-CoV-2 viral copies this assay can detect is 138 copies/mL. A  negative result does not preclude SARS-Cov-2 infection and should not be used as the sole basis for treatment or other patient management decisions. A negative result may occur with  improper specimen collection/handling, submission of specimen other than nasopharyngeal swab, presence of viral mutation(s) within the areas targeted by this assay, and inadequate number of viral copies(<138 copies/mL). A negative result must be combined with clinical observations, patient history, and epidemiological information. The expected result is Negative.  Fact Sheet for Patients:  EntrepreneurPulse.com.au  Fact Sheet for Healthcare Providers:  IncredibleEmployment.be  This test is no t yet approved or cleared by the [place] FDA and  has been authorized for detection and/or diagnosis of SARS-CoV-2 by FDA under an Emergency Use Authorization (EUA). This EUA will remain  in effect (meaning this test can be used) for  the duration of the COVID-19 declaration under Section 564(b)(1) of the Act, 21 U.S.C.section 360bbb-3(b)(1), unless the authorization is terminated  or revoked sooner.       Influenza A by PCR NEGATIVE NEGATIVE Final   Influenza B by PCR NEGATIVE NEGATIVE Final    Comment: (NOTE) The Xpert Xpress SARS-CoV-2/FLU/RSV plus assay is intended as an aid in the diagnosis of influenza from Nasopharyngeal swab specimens and should not be used as a sole basis for treatment. Nasal washings and aspirates are unacceptable for Xpert Xpress SARS-CoV-2/FLU/RSV testing.  Fact Sheet for Patients: EntrepreneurPulse.com.au  Fact Sheet for Healthcare Providers: IncredibleEmployment.be  This test is not yet approved or cleared by the [place] FDA and has been authorized for detection and/or diagnosis of SARS-CoV-2 by FDA under an Emergency Use Authorization (EUA). This EUA will remain in effect (meaning this test can be used) for the duration of the COVID-19 declaration under Section 564(b)(1) of the Act, 21 U.S.C. section 360bbb-3(b)(1), unless the authorization is terminated or revoked.  Performed at [facility], [address], [place], [place] [address]          Radiology Studies: CT ABDOMEN PELVIS W CONTRAST  Result Date: 10/27/2020 CLINICAL DATA:  Abdominal pain vomiting.  History of ovarian cancer. EXAM: CT ABDOMEN AND PELVIS WITH CONTRAST TECHNIQUE: Multidetector CT imaging of the abdomen and pelvis was performed using the standard protocol following bolus administration of intravenous contrast. CONTRAST:  61m OMNIPAQUE IOHEXOL 300 MG/ML  SOLN COMPARISON:  PET-CT 09/12/2020.  Abdomen/pelvis CT 12/01/2019 FINDINGS: Lower chest: Fluid is identified around the distal esophagus. Hepatobiliary: Periportal edema noted. New 3.5 x 3.4 cm heterogeneous lesion is identified in the left liver near the junction of the medial and lateral segments.  There is a new heterogeneous lesion in the posterior right liver measuring 2.2 cm on image 21/2. Gallbladder unremarkable. No intrahepatic or extrahepatic biliary dilation. Pancreas: No focal mass lesion. No dilatation of the main duct. No intraparenchymal cyst. No peripancreatic edema. Spleen: No splenomegaly. No focal mass lesion. Adrenals/Urinary Tract: No adrenal nodule or mass. 8 mm probable cyst upper pole right kidney is similar to prior. Left kidney unremarkable. No evidence for hydroureter. The urinary bladder appears normal for the degree of distention. Stomach/Bowel: Possible wall thickening at the distal esophagus/esophagogastric junction. Stomach otherwise unremarkable. Duodenum is normally positioned as is the ligament of Treitz. Diffuse small bowel dilatation measures up to 5 cm diameter in the right pelvis (image 57/2). A discrete transition zone is not identified, but distal small bowel and terminal ileum are decompressed. Terminal ileum is visible on coronal image 45/3. Colon is nondilated. Rectal anastomosis evident. Vascular/Lymphatic: There is abdominal aortic atherosclerosis  without aneurysm. Calcified retroperitoneal nodes are similar to PET-CT from 6 weeks ago. Portal vein and superior mesenteric vein are patent. Splenic vein is patent. No pelvic sidewall lymphadenopathy. Reproductive: Uterus surgically absent.  There is no adnexal mass. Other: Large volume intraperitoneal free fluid is similar to 09/12/2020. Small mixed attenuation left perirectal nodule is stable. No discrete measurable peritoneal nodule is evident. Musculoskeletal: No worrisome lytic or sclerotic osseous abnormality. IMPRESSION: 1. Interval development of diffuse small bowel dilatation up to 5 cm diameter with decompressed distal small bowel and terminal ileum. A discrete transition zone is not identified, but findings are compatible with distal small bowel obstruction. 2. Left hepatic lobe lesion is progressive since  PET-CT 6 weeks ago, demonstrated to be hypermetabolic and consistent with peritoneal disease. There is a new lesion in the posterior right liver compatible with metastatic deposit. 3. Large volume intraperitoneal free fluid is similar to 09/12/2020. As before, fluid generates mass-effect on the liver capsule. 4. Possible wall thickening at the distal esophagus/esophagogastric junction. Esophagitis would be a consideration although tumor involvement not excluded. 5. Stable small mixed attenuation left perirectal nodule. 6. Aortic Atherosclerosis (ICD10-I70.0). Electronically Signed   By: Misty Stanley M.D.   On: 10/27/2020 13:31   DG Abd Portable 1V  Result Date: 10/28/2020 CLINICAL DATA:  Nasogastric tube placement EXAM: PORTABLE ABDOMEN - 1 VIEW COMPARISON:  10/02/2019 FINDINGS: Nasogastric tube tip overlies the proximal body of the stomach. The visualized abdominal gas pattern is normal. Pelvis excluded from view. IMPRESSION: Nasogastric tube tip overlying the proximal body of the stomach. Electronically Signed   By: Fidela Salisbury MD   On: 10/28/2020 05:58        Scheduled Meds: . Chlorhexidine Gluconate Cloth  6 each Topical Daily  . gabapentin  300 mg Oral QHS  . levothyroxine  75 mcg Oral QAC breakfast  .  morphine injection  4 mg Intravenous Once   Continuous Infusions: . sodium chloride 125 mL/hr at 10/28/20 1126  . promethazine (PHENERGAN) injection (IM or IVPB) 12.5 mg (10/27/20 2226)     LOS: 0 days    Time spent: 30 minutes    Barb Merino, MD [facility] Pager [phone]

## 2020-10-28 NOTE — Telephone Encounter (Signed)
Called and spoke with Lilia Pro regarding my chart message sent to Dr. Theora Gianotti. She was encouraged to speak with the rounding hospitalist and the consulted surgeon regarding her concerns/request for transfer. Spoke regarding conservative measures for small bowel obstruction/surgery for small bowel obstruction. She reports she was told that there is a possibility that the tumor is wrapped around the bowel causing the obstruction. I will forward message to Dr. Theora Gianotti as she will not be at [facility] until 4/13.

## 2020-10-28 NOTE — Consult Note (Signed)
[facility]  [phone] [phone]  ID: Amanda Pena OB: 1955/11/09  MR#: [id number]  NGE#:[id number]  Patient Care Team: Cassandria Anger, MD as PCP - General (Internal Medicine) Arvella Nigh, MD as Consulting Physician (Obstetrics and Gynecology) Clent Jacks, RN as Oncology Nurse Navigator  CHIEF COMPLAINT: Progressive stage IV ovarian cancer, now with SBO.  INTERVAL HISTORY: Patient is a 65 year old female who is actively receiving treatment with Taxol and Avastin for aggressive stage IV ovarian cancer who recently presented to the hospital with intractable vomiting and abdominal pain.  Patient states she had symptoms approximately 9 days ago on a Saturday, but then these resolved and she felt "okay" for 2 to 3 days.  Her symptoms then became significantly worse late last week and presented to the emergency room and found to have probable small bowel obstruction secondary to progressive malignancy.  Since placing NG tube, her symptoms have improved.  She has no neurologic complaints.  She denies any recent fevers or illnesses.  She has a poor appetite.  She has no chest pain, shortness of breath, cough, or hemoptysis.  She denies any nausea, vomiting, constipation, or diarrhea.  She has no urinary complaints.  Patient feels generally terrible, but offers no further specific complaints today.  REVIEW OF SYSTEMS:   Review of Systems  Constitutional: Negative.  Negative for fever, malaise/fatigue and weight loss.  Respiratory: Negative.  Negative for cough and shortness of breath.   Cardiovascular: Negative.  Negative for chest pain and leg swelling.  Gastrointestinal: Positive for abdominal pain, nausea and vomiting.  Genitourinary: Negative.  Negative for dysuria.  Musculoskeletal: Negative.  Negative for back pain.  Skin: Negative.  Negative for rash.  Neurological: Negative.  Negative for dizziness, focal weakness, weakness and  headaches.  Psychiatric/Behavioral: Negative.  The patient is not nervous/anxious.     As per HPI. Otherwise, a complete review of systems is negative.  PAST MEDICAL HISTORY: Past Medical History:  Diagnosis Date  . Cancer ([place])   . Family history of breast cancer   . Family history of lung cancer   . HYPERLIPIDEMIA 04/09/2008   Qualifier: Diagnosis of  By: Wynona Luna   . HYPOTHYROIDISM 04/09/2008   Qualifier: Diagnosis of  By: Wynona Luna   . INSOMNIA, CHRONIC 08/21/2010   Qualifier: Diagnosis of  By: Wynona Luna   . PERSONAL HISTORY MALIGNANT NEOPLASM THYROID 08/21/2010   Qualifier: Diagnosis of  By: Wynona Luna     PAST SURGICAL HISTORY: Past Surgical History:  Procedure Laterality Date  . IR IMAGING GUIDED PORT INSERTION  07/18/2019  . THYROIDECTOMY, PARTIAL      FAMILY HISTORY: Family History  Problem Relation Age of Onset  . Arthritis Mother   . Hyperlipidemia Mother   . Hyperlipidemia Father   . Diabetes Father   . Hypertension Father   . Heart disease Father 78       CAD  . Lung cancer Father   . Breast cancer Maternal Grandmother        in 75s  . Cancer Maternal Aunt        unk type  . Cancer Maternal Uncle        unk type  . Cancer Paternal Uncle        unk type, d. 28s  . Skin cancer Daughter        basal cell     ADVANCED DIRECTIVES (Y/N):  @ADVDIR @  HEALTH  MAINTENANCE: Social History   Tobacco Use  . Smoking status: Never Smoker  . Smokeless tobacco: Never Used  Vaping Use  . Vaping Use: Never used  Substance Use Topics  . Alcohol use: Not Currently  . Drug use: No     Colonoscopy:  PAP:  Bone density:  Lipid panel:  No Known Allergies  Current Facility-Administered Medications  Medication Dose Route Frequency Provider Last Rate Last Admin  . 0.9 %  sodium chloride infusion   Intravenous Continuous Barb Merino, MD 125 mL/hr at 10/28/20 1126 New Bag at 10/28/20 1126  . acetaminophen (TYLENOL) tablet 1,000  mg  1,000 mg Oral Q6H PRN Cox, Amy N, DO   1,000 mg at 10/28/20 0348   Or  . acetaminophen (TYLENOL) suppository 650 mg  650 mg Rectal Q6H PRN Cox, Amy N, DO      . Chlorhexidine Gluconate Cloth 2 % PADS 6 each  6 each Topical Daily Barb Merino, MD   6 each at 10/28/20 0932  . gabapentin (NEURONTIN) capsule 300 mg  300 mg Oral QHS Cox, Amy N, DO   300 mg at 10/27/20 2040  . levothyroxine (SYNTHROID) tablet 75 mcg  75 mcg Oral QAC breakfast Cox, Amy N, DO      . LORazepam (ATIVAN) injection 0.5 mg  0.5 mg Intravenous BID PRN Cox, Amy N, DO   0.5 mg at 10/28/20 1124  . melatonin tablet 5 mg  5 mg Oral QHS PRN Cox, Amy N, DO      . morphine 2 MG/ML injection 2 mg  2 mg Intravenous Q3H PRN Barb Merino, MD      . morphine 4 MG/ML injection 4 mg  4 mg Intravenous Once Cox, Amy N, DO      . ondansetron (ZOFRAN) tablet 4 mg  4 mg Oral Q6H PRN Cox, Amy N, DO       Or  . ondansetron (ZOFRAN) injection 4 mg  4 mg Intravenous Q6H PRN Cox, Amy N, DO   4 mg at 10/28/20 0932  . promethazine (PHENERGAN) 12.5 mg in sodium chloride 0.9 % 50 mL IVPB  12.5 mg Intravenous Q6H PRN Cox, Amy N, DO 200 mL/hr at 10/27/20 2226 12.5 mg at 10/27/20 2226    OBJECTIVE: Vitals:   10/28/20 0930 10/28/20 1112  BP: 116/78 127/70  Pulse: (!) 114 92  Resp: 20 15  Temp: 97.9 F (36.6 C) 98.9 F (37.2 C)  SpO2: 99% 99%     Body mass index is 18.56 kg/m.    ECOG FS:3 - Symptomatic, >50% confined to bed  General: Well-developed, well-nourished, no acute distress. Eyes: Pink conjunctiva, anicteric sclera. HEENT: Normocephalic, moist mucous membranes.  NG tube in place. Lungs: No audible wheezing or coughing. Heart: Regular rate and rhythm. Abdomen: Soft, nontender, no obvious distention. Musculoskeletal: No edema, cyanosis, or clubbing. Neuro: Alert, answering all questions appropriately. Cranial nerves grossly intact. Skin: No rashes or petechiae noted. Psych: Normal affect.   LAB RESULTS:  Lab Results   Component Value Date   NA 138 10/28/2020   K 4.0 10/28/2020   CL 102 10/28/2020   CO2 26 10/28/2020   GLUCOSE 116 (H) 10/28/2020   BUN 33 (H) 10/28/2020   CREATININE 0.87 10/28/2020   CALCIUM 8.4 (L) 10/28/2020   PROT 7.5 10/27/2020   ALBUMIN 3.9 10/27/2020   AST 20 10/27/2020   ALT 15 10/27/2020   ALKPHOS 85 10/27/2020   BILITOT 1.6 (H) 10/27/2020   GFRNONAA >60 10/28/2020  GFRAA >60 01/29/2020    Lab Results  Component Value Date   WBC 6.4 10/28/2020   NEUTROABS 4.8 10/23/2020   HGB 11.2 (L) 10/28/2020   HCT 34.4 (L) 10/28/2020   MCV 94.0 10/28/2020   PLT 228 10/28/2020     STUDIES: CT ABDOMEN PELVIS W CONTRAST  Result Date: 10/27/2020 CLINICAL DATA:  Abdominal pain vomiting.  History of ovarian cancer. EXAM: CT ABDOMEN AND PELVIS WITH CONTRAST TECHNIQUE: Multidetector CT imaging of the abdomen and pelvis was performed using the standard protocol following bolus administration of intravenous contrast. CONTRAST:  59mL OMNIPAQUE IOHEXOL 300 MG/ML  SOLN COMPARISON:  PET-CT 09/12/2020.  Abdomen/pelvis CT 12/01/2019 FINDINGS: Lower chest: Fluid is identified around the distal esophagus. Hepatobiliary: Periportal edema noted. New 3.5 x 3.4 cm heterogeneous lesion is identified in the left liver near the junction of the medial and lateral segments. There is a new heterogeneous lesion in the posterior right liver measuring 2.2 cm on image 21/2. Gallbladder unremarkable. No intrahepatic or extrahepatic biliary dilation. Pancreas: No focal mass lesion. No dilatation of the main duct. No intraparenchymal cyst. No peripancreatic edema. Spleen: No splenomegaly. No focal mass lesion. Adrenals/Urinary Tract: No adrenal nodule or mass. 8 mm probable cyst upper pole right kidney is similar to prior. Left kidney unremarkable. No evidence for hydroureter. The urinary bladder appears normal for the degree of distention. Stomach/Bowel: Possible wall thickening at the distal esophagus/esophagogastric  junction. Stomach otherwise unremarkable. Duodenum is normally positioned as is the ligament of Treitz. Diffuse small bowel dilatation measures up to 5 cm diameter in the right pelvis (image 57/2). A discrete transition zone is not identified, but distal small bowel and terminal ileum are decompressed. Terminal ileum is visible on coronal image 45/3. Colon is nondilated. Rectal anastomosis evident. Vascular/Lymphatic: There is abdominal aortic atherosclerosis without aneurysm. Calcified retroperitoneal nodes are similar to PET-CT from 6 weeks ago. Portal vein and superior mesenteric vein are patent. Splenic vein is patent. No pelvic sidewall lymphadenopathy. Reproductive: Uterus surgically absent.  There is no adnexal mass. Other: Large volume intraperitoneal free fluid is similar to 09/12/2020. Small mixed attenuation left perirectal nodule is stable. No discrete measurable peritoneal nodule is evident. Musculoskeletal: No worrisome lytic or sclerotic osseous abnormality. IMPRESSION: 1. Interval development of diffuse small bowel dilatation up to 5 cm diameter with decompressed distal small bowel and terminal ileum. A discrete transition zone is not identified, but findings are compatible with distal small bowel obstruction. 2. Left hepatic lobe lesion is progressive since PET-CT 6 weeks ago, demonstrated to be hypermetabolic and consistent with peritoneal disease. There is a new lesion in the posterior right liver compatible with metastatic deposit. 3. Large volume intraperitoneal free fluid is similar to 09/12/2020. As before, fluid generates mass-effect on the liver capsule. 4. Possible wall thickening at the distal esophagus/esophagogastric junction. Esophagitis would be a consideration although tumor involvement not excluded. 5. Stable small mixed attenuation left perirectal nodule. 6. Aortic Atherosclerosis (ICD10-I70.0). Electronically Signed   By: Misty Stanley M.D.   On: 10/27/2020 13:31   DG Abd  Portable 1V  Result Date: 10/28/2020 CLINICAL DATA:  Nasogastric tube placement EXAM: PORTABLE ABDOMEN - 1 VIEW COMPARISON:  10/02/2019 FINDINGS: Nasogastric tube tip overlies the proximal body of the stomach. The visualized abdominal gas pattern is normal. Pelvis excluded from view. IMPRESSION: Nasogastric tube tip overlying the proximal body of the stomach. Electronically Signed   By: Fidela Salisbury MD   On: 10/28/2020 05:58    ASSESSMENT: Progressive stage IV ovarian  cancer, now with SBO.  PLAN:    1.  Progressive stage IV ovarian cancer: Patient last received chemotherapy with Taxol only on October 23, 2020.  Her next treatment was supposed to occur on October 30, 2020.  Given her progressive disease seen on CT scan from October 27, 2020, will discontinue this therapy.  Patient has expressed interest in continuing chemotherapy if possible, but she also expressed understanding that her options are extremely limited and discussions of end-of-life and hospice are also necessary.  Return to clinic next week after discharge for further evaluation and discussion of whether the use of single agent gemcitabine would be beneficial.  Appreciate palliative care input. 2.  SBO: Appreciate surgical input.  Surgical intervention does not appear to be an option.  Continue conservative management with NG tube.  Worst-case scenario, patient may require venting PEG tube for symptomatic relief.  Patient last received Avastin on October 16, 2020.  Appreciate consult, will follow.  Lloyd Huger, MD   10/28/2020 1:38 PM

## 2020-10-29 ENCOUNTER — Encounter: Payer: Self-pay | Admitting: Obstetrics and Gynecology

## 2020-10-29 MED ORDER — OCTREOTIDE ACETATE 100 MCG/ML IJ SOLN
100.0000 ug | Freq: Once | INTRAMUSCULAR | Status: AC
  Administered 2020-10-29: 100 ug via INTRAVENOUS
  Filled 2020-10-29: qty 1

## 2020-10-29 MED ORDER — OCTREOTIDE ACETATE 100 MCG/ML IJ SOLN
100.0000 ug | Freq: Three times a day (TID) | INTRAMUSCULAR | Status: DC
  Administered 2020-10-29 – 2020-10-30 (×6): 100 ug via INTRAVENOUS
  Filled 2020-10-29 (×9): qty 1

## 2020-10-29 MED ORDER — LORAZEPAM 2 MG/ML IJ SOLN
0.5000 mg | Freq: Two times a day (BID) | INTRAMUSCULAR | Status: DC | PRN
  Administered 2020-10-29: 0.5 mg via INTRAVENOUS
  Filled 2020-10-29: qty 1

## 2020-10-29 MED ORDER — SODIUM CHLORIDE 0.9 % IV SOLN: INTRAVENOUS | Status: DC

## 2020-10-29 MED ORDER — MENTHOL 3 MG MT LOZG
1.0000 | LOZENGE | OROMUCOSAL | Status: DC | PRN
  Filled 2020-10-29: qty 9

## 2020-10-29 NOTE — Progress Notes (Signed)
[place]  Telephone:([phone] [phone]   Name: Amanda Pena Date: 10/29/2020 MRN: [id number]  DOB: 1955/08/03  Patient Care Team: Cassandria Anger, MD as PCP - General (Internal Medicine) Arvella Nigh, MD as Consulting Physician (Obstetrics and Gynecology) Clent Jacks, RN as Oncology Nurse Navigator    REASON FOR CONSULTATION: Amanda Pena is a 65 y.o. 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 PMH also notable for CIPN, history of thyroid cancer status post thyroidectomy, anxiety, depression, insomnia, and hyperlipidemia.  Patient is followed locally by [facility]. She is currently on third line Taxol/Avastin.  Patient was admitted to the hospital 10/27/20 with intractable nausea, vomiting and abdominal pain.  CT of the abdomen and pelvis was consistent with SBO and progressive liver metastasis and worsening peritoneal disease.  Patient was referred to palliative care to help address goals and manage ongoing symptoms..   CODE STATUS: DNR  PAST MEDICAL HISTORY: Past Medical History:  Diagnosis Date  . Cancer ([place])   . Family history of breast cancer   . Family history of lung cancer   . HYPERLIPIDEMIA 04/09/2008   Qualifier: Diagnosis of  By: Wynona Luna   . HYPOTHYROIDISM 04/09/2008   Qualifier: Diagnosis of  By: Wynona Luna   . INSOMNIA, CHRONIC 08/21/2010   Qualifier: Diagnosis of  By: Wynona Luna   . PERSONAL HISTORY MALIGNANT NEOPLASM THYROID 08/21/2010   Qualifier: Diagnosis of  By: Wynona Luna     PAST SURGICAL HISTORY:  Past Surgical History:  Procedure Laterality Date  . IR IMAGING GUIDED PORT INSERTION  07/18/2019  .  THYROIDECTOMY, PARTIAL      HEMATOLOGY/ONCOLOGY HISTORY:  Oncology History Overview Note  #November 2020-omental caking/peritoneal carcinomatosis; bilateral adnexal masses 4-5 cm in size; multiple lung nodules;   # dec 8th 2020-omental biopsy; positive for high-grade serous adenocarcinoma; GYN origin.  CT scan chest-bilateral lung nodules; 3.8 cm soft tissue mass adjacent to GE junction  # DEC 11TH 2020-CARBO-Taxol [chemo consent] x 3 cycles; February 2021-improvement of the peritoneal carcinomatosis; lung lesions.. 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  Partial colectomy  # #Genetic counseling-s/p NEG myRiskMyriad.   # Thyroid cancer at 26y s/p thyroidectomy [no RAIU]  # NGS/MOLECULAR TESTS: My risk myriad negative; HRD negative; NGS-P  # PALLIATIVE CARE EVALUATION:P  # PAIN MANAGEMENT: P   DIAGNOSIS: Bilateral ovarian cancer/tube/peritoneal  STAGE: IV   ;  GOALS: Control  CURRENT/MOST RECENT THERAPY : Carbotaxol [C]    Peritoneal carcinoma ([place])  06/23/2019 Initial Diagnosis   Peritoneal carcinoma ([place])   06/30/2019 - 11/17/2019 Chemotherapy   The patient had dexamethasone (DECADRON) 4 MG tablet, 8 mg, Oral, Daily, 1 of 1 cycle, Start date: --, End date: -- palonosetron (ALOXI) injection 0.25 mg, 0.25 mg, Intravenous,  Once, 6 of 6 cycles Administration: 0.25 mg (06/30/2019), 0.25 mg (07/24/2019), 0.25 mg (08/14/2019), 0.25 mg (10/06/2019), 0.25 mg (10/27/2019), 0.25 mg (11/17/2019) pegfilgrastim (NEULASTA ONPRO KIT) injection 6 mg, 6 mg, Subcutaneous, Once, 1 of 1 cycle Administration: 6 mg (11/17/2019) CARBOplatin (PARAPLATIN) 570 mg in sodium chloride 0.9 % 250 mL chemo infusion, 570 mg (100 % of original dose 567 mg), Intravenous,  Once, 6 of 6  cycles Dose modification:   (original dose 567 mg, Cycle 1) Administration: 570 mg (06/30/2019), 570 mg (07/24/2019), 570 mg (08/14/2019),  520 mg (10/27/2019), 520 mg (11/17/2019) fosaprepitant (EMEND) 150 mg in sodium chloride 0.9 % 145 mL IVPB, 150 mg, Intravenous,  Once, 1 of 1 cycle PACLitaxel (TAXOL) 288 mg in sodium chloride 0.9 % 250 mL chemo infusion (> 8m/m2), 175 mg/m2 = 288 mg, Intravenous,  Once, 6 of 6 cycles Administration: 288 mg (06/30/2019), 288 mg (07/24/2019), 288 mg (08/14/2019), 288 mg (10/06/2019), 288 mg (10/27/2019), 288 mg (11/17/2019) fosaprepitant (EMEND) 150 mg, dexamethasone (DECADRON) 12 mg in sodium chloride 0.9 % 145 mL IVPB, , Intravenous,  Once, 5 of 5 cycles Administration:  (07/24/2019),  (08/14/2019),  (10/06/2019),  (10/27/2019),  (11/17/2019)  for chemotherapy treatment.     Genetic Testing   Negative genetic testing. No pathogenic variants identified on the Myriad MyRisk+HRD. The report date is 10/02/2019.  The [facility] panel offered by [employer]includes sequencing and deletion/duplication testing of the following 35 genes: APC, ATM, AXIN2, BARD1, BMPR1A, BRCA1, BRCA2, BRIP1, CHD1, CDK4, CDKN2A, CHEK2, EPCAM (large rearrangement only), HOXB13, GALNT12, MLH1, MSH2, MSH3, MSH6, MUTYH, NBN, NTHL1, PALB2, PMS2, PTEN, RAD51C, RAD51D, RNF43, RPS20, SMAD4, STK11, and TP53. Sequencing was performed for select regions of POLE and POLD1, and large rearrangement analysis was performed for select regions of GREM1.    12/19/2019 - 12/19/2019 Chemotherapy   The patient had DOXOrubicin HCL LIPOSOMAL (DOXIL) 64 mg in dextrose 5 % 250 mL chemo infusion, 40 mg/m2, Intravenous,  Once, 0 of 6 cycles bevacizumab-bvzr (ZIRABEV) 500 mg in sodium chloride 0.9 % 100 mL chemo infusion, 10 mg/kg, Intravenous,  Once, 0 of 6 cycles  for chemotherapy treatment.    07/17/2020 -  Chemotherapy    Patient is on Treatment Plan: OVARIAN PACLITAXEL  DK0,9,38,18+ BEVACIZUMAB D1,15 Q28D      Malign[place] of [place] ([place]  06/23/2019 Initial Diagnosis   Malignant [place] of [place] ([place]   12/19/2019 - 12/19/2019  Chemotherapy   The patient had DOXOrubicin HCL LIPOSOMAL (DOXIL) 64 mg in dextrose 5 % 250 mL chemo infusion, 40 mg/m2, Intravenous,  Once, 0 of 6 cycles bevacizumab-bvzr (ZIRABEV) 500 mg in sodium chloride 0.9 % 100 mL chemo infusion, 10 mg/kg, Intravenous,  Once, 0 of 6 cycles  for chemotherapy treatment.    07/17/2020 -  Chemotherapy    Patient is on Treatment Plan: OVARIAN PACLITAXEL  DE9,9,37,16+ BEVACIZUMAB D1,15 Q28D      07/17/2020 Cancer Staging   Staging form: Ovary, Fallopian Tube, and Primary Peritoneal Carcinoma, AJCC 8th Edition - Clinical: FIGO Stage IVA (pM1a) - Signed by FLloyd Huger MD on 07/17/2020   Peritoneal carcinomatosis ([place]  12/19/2019 - 12/19/2019 Chemotherapy   The patient had DOXOrubicin HCL LIPOSOMAL (DOXIL) 64 mg in dextrose 5 % 250 mL chemo infusion, 40 mg/m2, Intravenous,  Once, 0 of 6 cycles bevacizumab-bvzr (ZIRABEV) 500 mg in sodium chloride 0.9 % 100 mL chemo infusion, 10 mg/kg, Intravenous,  Once, 0 of 6 cycles  for chemotherapy treatment.    12/19/2019 Initial Diagnosis   Peritoneal carcinomatosis ([place]   07/17/2020 -  Chemotherapy   The patient had PACLitaxel (TAXOL) 126 mg in sodium chloride 0.9 % 250 mL chemo infusion (</= 8108mm2), 80 mg/m2 = 126 mg, Intravenous,  Once, 0 of 4 cycles bevacizumab-bvzr (ZIRABEV) 500 mg in sodium chloride 0.9 % 100 mL chemo infusion, 10 mg/kg = 500 mg, Intravenous,  Once, 0 of 4 cycles  for chemotherapy treatment.    07/17/2020 -  Chemotherapy    Patient is on Treatment Plan: OVARIAN PACLITAXEL  D9,8,33,82 + BEVACIZUMAB D1,15 Q28D        ALLERGIES:  has No Known Allergies.  MEDICATIONS:  Current Facility-Administered Medications  Medication Dose Route Frequency Provider Last Rate Last Admin  . 0.9 %  sodium chloride infusion   Intravenous Continuous Barb Merino, MD 125 mL/hr at 10/29/20 1100 New Bag at 10/29/20 1100  . acetaminophen (TYLENOL) tablet 1,000 mg  1,000 mg Oral Q6H PRN Cox, Amy N, DO    1,000 mg at 10/28/20 1837   Or  . acetaminophen (TYLENOL) suppository 650 mg  650 mg Rectal Q6H PRN Cox, Amy N, DO      . Chlorhexidine Gluconate Cloth 2 % PADS 6 each  6 each Topical Daily Barb Merino, MD   6 each at 10/28/20 0932  . dexamethasone (DECADRON) injection 4 mg  4 mg Intravenous Q12H , Kirt Boys, NP   4 mg at 10/29/20 0909  . gabapentin (NEURONTIN) capsule 300 mg  300 mg Oral QHS Cox, Amy N, DO   300 mg at 10/27/20 2040  . levothyroxine (SYNTHROID) tablet 75 mcg  75 mcg Oral QAC breakfast Cox, Amy N, DO   75 mcg at 10/29/20 0544  . LORazepam (ATIVAN) injection 0.5 mg  0.5 mg Intravenous BID PRN Cox, Amy N, DO   0.5 mg at 10/28/20 2326  . melatonin tablet 5 mg  5 mg Oral QHS PRN Cox, Amy N, DO      . morphine 2 MG/ML injection 2 mg  2 mg Intravenous Q3H PRN Barb Merino, MD   2 mg at 10/28/20 1431  . morphine 4 MG/ML injection 4 mg  4 mg Intravenous Once Cox, Amy N, DO      . octreotide (SANDOSTATIN) injection 100 mcg  100 mcg Intravenous TID , Kirt Boys, NP   100 mcg at 10/29/20 1100  . ondansetron (ZOFRAN) 8 mg in sodium chloride 0.9 % 50 mL IVPB  8 mg Intravenous Q8H ,  R, NP 216 mL/hr at 10/29/20 0547 8 mg at 10/29/20 0547  . ondansetron (ZOFRAN) injection 4 mg  4 mg Intravenous Q6H PRN Cox, Amy N, DO   4 mg at 10/28/20 0932  . promethazine (PHENERGAN) 12.5 mg in sodium chloride 0.9 % 50 mL IVPB  12.5 mg Intravenous Q6H PRN Cox, Amy N, DO 200 mL/hr at 10/27/20 2226 12.5 mg at 10/27/20 2226    VITAL SIGNS: BP 135/75   Pulse 79   Temp 98.5 F (36.9 C) (Oral)   Resp 16   Ht 5' 6" (1.676 m)   Wt 115 lb (52.2 kg)   SpO2 98%   BMI 18.56 kg/m  Filed Weights   10/27/20 0915  Weight: 115 lb (52.2 kg)    Estimated body mass index is 18.56 kg/m as calculated from the following:   Height as of this encounter: 5' 6" (1.676 m).   Weight as of this encounter: 115 lb (52.2 kg).  LABS: CBC:    Component Value Date/Time   WBC 6.4 10/28/2020 0420    HGB 11.2 (L) 10/28/2020 0420   HCT 34.4 (L) 10/28/2020 0420   PLT 228 10/28/2020 0420   MCV 94.0 10/28/2020 0420   NEUTROABS 4.8 10/23/2020 0857   LYMPHSABS 1.2 10/23/2020 0857   MONOABS 0.3 10/23/2020 0857   EOSABS 0.1 10/23/2020 0857   BASOSABS 0.0 10/23/2020 0857   Comprehensive Metabolic Panel:  Component Value Date/Time   NA 138 10/28/2020 0420   K 4.0 10/28/2020 0420   CL 102 10/28/2020 0420   CO2 26 10/28/2020 0420   BUN 33 (H) 10/28/2020 0420   CREATININE 0.87 10/28/2020 0420   CREATININE 0.78 03/08/2014 0822   GLUCOSE 116 (H) 10/28/2020 0420   CALCIUM 8.4 (L) 10/28/2020 0420   AST 20 10/27/2020 0942   ALT 15 10/27/2020 0942   ALKPHOS 85 10/27/2020 0942   BILITOT 1.6 (H) 10/27/2020 0942   PROT 7.5 10/27/2020 0942   ALBUMIN 3.9 10/27/2020 0942    RADIOGRAPHIC STUDIES: CT ABDOMEN PELVIS W CONTRAST  Result Date: 10/27/2020 CLINICAL DATA:  Abdominal pain vomiting.  History of ovarian cancer. EXAM: CT ABDOMEN AND PELVIS WITH CONTRAST TECHNIQUE: Multidetector CT imaging of the abdomen and pelvis was performed using the standard protocol following bolus administration of intravenous contrast. CONTRAST:  58m OMNIPAQUE IOHEXOL 300 MG/ML  SOLN COMPARISON:  PET-CT 09/12/2020.  Abdomen/pelvis CT 12/01/2019 FINDINGS: Lower chest: Fluid is identified around the distal esophagus. Hepatobiliary: Periportal edema noted. New 3.5 x 3.4 cm heterogeneous lesion is identified in the left liver near the junction of the medial and lateral segments. There is a new heterogeneous lesion in the posterior right liver measuring 2.2 cm on image 21/2. Gallbladder unremarkable. No intrahepatic or extrahepatic biliary dilation. Pancreas: No focal mass lesion. No dilatation of the main duct. No intraparenchymal cyst. No peripancreatic edema. Spleen: No splenomegaly. No focal mass lesion. Adrenals/Urinary Tract: No adrenal nodule or mass. 8 mm probable cyst upper pole right kidney is similar to prior.  Left kidney unremarkable. No evidence for hydroureter. The urinary bladder appears normal for the degree of distention. Stomach/Bowel: Possible wall thickening at the distal esophagus/esophagogastric junction. Stomach otherwise unremarkable. Duodenum is normally positioned as is the ligament of Treitz. Diffuse small bowel dilatation measures up to 5 cm diameter in the right pelvis (image 57/2). A discrete transition zone is not identified, but distal small bowel and terminal ileum are decompressed. Terminal ileum is visible on coronal image 45/3. Colon is nondilated. Rectal anastomosis evident. Vascular/Lymphatic: There is abdominal aortic atherosclerosis without aneurysm. Calcified retroperitoneal nodes are similar to PET-CT from 6 weeks ago. Portal vein and superior mesenteric vein are patent. Splenic vein is patent. No pelvic sidewall lymphadenopathy. Reproductive: Uterus surgically absent.  There is no adnexal mass. Other: Large volume intraperitoneal free fluid is similar to 09/12/2020. Small mixed attenuation left perirectal nodule is stable. No discrete measurable peritoneal nodule is evident. Musculoskeletal: No worrisome lytic or sclerotic osseous abnormality. IMPRESSION: 1. Interval development of diffuse small bowel dilatation up to 5 cm diameter with decompressed distal small bowel and terminal ileum. A discrete transition zone is not identified, but findings are compatible with distal small bowel obstruction. 2. Left hepatic lobe lesion is progressive since PET-CT 6 weeks ago, demonstrated to be hypermetabolic and consistent with peritoneal disease. There is a new lesion in the posterior right liver compatible with metastatic deposit. 3. Large volume intraperitoneal free fluid is similar to 09/12/2020. As before, fluid generates mass-effect on the liver capsule. 4. Possible wall thickening at the distal esophagus/esophagogastric junction. Esophagitis would be a consideration although tumor involvement  not excluded. 5. Stable small mixed attenuation left perirectal nodule. 6. Aortic Atherosclerosis (ICD10-I70.0). Electronically Signed   By: EMisty StanleyM.D.   On: 10/27/2020 13:31   DG Abd Portable 1V  Result Date: 10/28/2020 CLINICAL DATA:  Nasogastric tube placement EXAM: PORTABLE ABDOMEN - 1 VIEW COMPARISON:  10/02/2019 FINDINGS: Nasogastric  tube tip overlies the proximal body of the stomach. The visualized abdominal gas pattern is normal. Pelvis excluded from view. IMPRESSION: Nasogastric tube tip overlying the proximal body of the stomach. Electronically Signed   By: Fidela Salisbury MD   On: 10/28/2020 05:58    PERFORMANCE STATUS (ECOG) : 2 - Symptomatic, <50% confined to bed  Review of Systems Unless otherwise noted, a complete review of systems is negative.  Physical Exam General: NAD Pulmonary: unlabored Extremities: no edema, no joint deformities Skin: no rashes Neurological: Weakness but otherwise nonfocal  IMPRESSION: Follow-up visit.   No significant changes overnight.  Patient continues to have bilious drainage from NG tube.  No nausea at present.  Patient has no flatus.  No pain currently.  Patient was up ambulating in the hallway this morning.  Patient and family remain hopeful for improvement.  Their goals are aligned with current scope of treatment.  Spoke with pharmacist and hospitalist.  Patient is not interested in continued octreotide via subcutaneous injection.  Agree with switching to IV.  Could consider IM depot injection if needed.   Patient would also like to hold off on abdominal XR today.   PLAN: -Continue current scope of treatment -Agree with rotating octreotide from subcutaneous injection to IV push -Continue dexamethasone, antiemetics, and as needed analgesics -Encourage ambulation -Will follow  Case and plan discussed with Drs. Kurtis Bushman and Finne[place]   Time Total: 25 minutes  Visit consisted of counseling and education dealing with the  complex and emotionally intense issues of symptom management and palliative care in the setting of serious and potentially life-threatening illness.Greater than 50%  of this time was spent counseling and coordinating care related to the above assessment and plan.  Signed by: Altha Harm, PhD, NP-C

## 2020-10-29 NOTE — TOC Initial Note (Signed)
Transition of Care [facility]) - Initial/Assessment Note    Patient Details  Name: Amanda Pena MRN: [id number] Date of Birth: September 12, 1955  Transition of [facility]) CM/SW Contact:    Beverly Sessions, RN Phone Number: 10/29/2020, 2:06 PM  Clinical Narrative:                 Discussed case MD and palliative Palliative recommends to wait 1-2 more days before completing assessment.  Patient and family still taking in new of diease progression.  If symptoms do not improve would be appropriate for hospice.  Patient does have supportive family         Patient Goals and CMS Choice        Expected Discharge Plan and Services                                                Prior Living Arrangements/Services                       Activities of Daily Living Home Assistive Devices/Equipment: None ADL Screening (condition at time of admission) Patient's cognitive ability adequate to safely complete daily activities?: Yes Is the patient deaf or have difficulty hearing?: No Does the patient have difficulty seeing, even when wearing glasses/contacts?: No Does the patient have difficulty concentrating, remembering, or making decisions?: No Patient able to express need for assistance with ADLs?: No Does the patient have difficulty dressing or bathing?: No Independently performs ADLs?: Yes (appropriate for developmental age) Does the patient have difficulty walking or climbing stairs?: No Weakness of Legs: Both Weakness of Arms/Hands: None  Permission Sought/Granted                  Emotional Assessment              Admission diagnosis:  Small bowel obstruction ([place]) [K56.609] SBO (small bowel obstruction) ([place]) [K56.609] Small bowel obstruction due to adhesions [facility]) [K56.50] Patient Active Problem List   Diagnosis Date Noted  . Malnutrition of moderate degree 10/28/2020  . Small bowel obstruction due to adhesions ([place]) 10/28/2020  . Palliative care  encounter   . SBO (small bowel obstruction) ([place]) 10/27/2020  . 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 COVID-19 vaccine series declined 05/28/2020  . Peritoneal carcinomatosis ([place]) 12/19/2019  . Genetic testing 11/16/2019  . Family history of breast cancer   . Family history of lung cancer   . Postoperative pain 10/04/2019  . Other constipation 10/04/2019  . Peritoneal carcinoma ([place]) 06/23/2019  . Malignant neoplasm of ovary ([place]) 06/23/2019  . Abdominal pain 06/13/2019  . LLQ abdominal pain 06/13/2019  . Calcific bursitis of shoulder 01/29/2016  . Cough 10/15/2014  . Cervical pain (neck) 10/15/2014  . Situational anxiety 03/07/2014  . Goals of care, counseling/discussion 02/10/2013  . 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 Preventative health care 11/09/2011  . INSOMNIA, CHRONIC 08/21/2010  . PERSONAL HISTORY MALIGNANT NEOPLASM THYROID 08/21/2010  . Hypothyroidism 04/09/2008  . Hyperlipidemia 04/09/2008  . Sinusitis 04/09/2008   PCP:  Cassandria Anger, MD Pharmacy:   [id number] - [place], [place] - [address] [address] Phone: [phone] Fax: [phone]     Social Determinants of Health (SDOH) Interventions    Readmission Risk Interventions No flowsheet data found.

## 2020-10-29 NOTE — Progress Notes (Signed)
Patient verbalized that her stomach just doesn't feel "settled". She said its "not really a nauseated feeling or painful. It just feels like it is heading in the wrong way". Dr Christian Mate notified about this. No new orders at this time. It was noted after this conversation that the NG tube had come out to the 35cm mark. NG advanced back to the 52cm mark

## 2020-10-29 NOTE — Progress Notes (Signed)
Order received from Dr Christian Mate to continue IVF (they were scheduled to expire at 1700)

## 2020-10-29 NOTE — Progress Notes (Addendum)
[facility] SURGICAL PROGRESS NOTE (cpt [phone])  Hospital Day(s): 1.    Interval History: Patient seen and examined, no acute events or new complaints overnight. Patient reports she overall feels better this morning but is frustrated with lack of bowel function return. She denies any fevers, chills, nausea, emesis. No new labs or imaging this morning. NGT output recorded at 700 ccs for the last 24 hours, but this remains bilious. She is able to ambulate. No reports of flatus. She did meet with oncology and palliative care yesterday. She is now DNR and seems very understanding of her disease process and gnosis.   Review of Systems:  Constitutional: denies fever, chills  HEENT: denies cough or congestion  Respiratory: denies any shortness of breath  Cardiovascular: denies chest pain or palpitations  Gastrointestinal: denies abdominal pain, N/V, or diarrhea/and bowel function as per interval history Genitourinary: denies burning with urination or urinary frequency  Vital signs in last 24 hours: [min-max] current  Temp:  [97.7 F (36.5 C)-98.9 F (37.2 C)] 97.8 F (36.6 C) (04/12 0540) Pulse Rate:  [84-114] 84 (04/12 0540) Resp:  [15-20] 16 (04/12 0540) BP: (116-130)/(70-78) 130/78 (04/12 0540) SpO2:  [96 %-99 %] 99 % (04/12 0540)     Height: 5\' 6"  (167.6 cm) Weight: 52.2 kg BMI (Calculated): 18.57   Intake/Output last 2 shifts:  04/11 0701 - 04/12 0700 In: 2378.5 [I.V.:2328.4; IV Piggyback:50.1] Out: 1000 [Urine:300; Emesis/NG output:700]   Physical Exam:  Constitutional: alert, cooperative and no distress, somewhat rail appearing HENT: normocephalic without obvious abnormality, NGT in place with bilious output Eyes: PERRL, EOM's grossly intact and symmetric  Respiratory: breathing non-labored at rest  Cardiovascular: regular rate and sinus rhythm  Gastrointestinal: Soft, non-tender, and non-distended, no rebound/guarding. Previous laparotomy scars present,.   Musculoskeletal: no edema or wounds, motor and sensation grossly intact, NT    Labs:  CBC Latest Ref Rng & Units 10/28/2020 10/27/2020 10/23/2020  WBC 4.0 - 10.5 K/uL 6.4 7.0 6.4  Hemoglobin 12.0 - 15.0 g/dL 11.2(L) 12.7 11.3(L)  Hematocrit 36.0 - 46.0 % 34.4(L) 38.7 34.6(L)  Platelets 150 - 400 K/uL 228 260 198   CMP Latest Ref Rng & Units 10/28/2020 10/27/2020 10/23/2020  Glucose 70 - 99 mg/dL 116(H) 123(H) 106(H)  BUN 8 - 23 mg/dL 33(H) 29(H) 15  Creatinine 0.44 - 1.00 mg/dL 0.87 1.14(H) 0.78  Sodium 135 - 145 mmol/L 138 136 138  Potassium 3.5 - 5.1 mmol/L 4.0 4.1 4.0  Chloride 98 - 111 mmol/L 102 96(L) 104  CO2 22 - 32 mmol/L 26 27 25   Calcium 8.9 - 10.3 mg/dL 8.4(L) 9.1 8.8(L)  Total Protein 6.5 - 8.1 g/dL - 7.5 6.7  Total Bilirubin 0.3 - 1.2 mg/dL - 1.6(H) 0.5  Alkaline Phos 38 - 126 U/L - 85 78  AST 15 - 41 U/L - 20 20  ALT 0 - 44 U/L - 15 14     Imaging studies: No new pertinent imaging studies   Assessment/Plan: (ICD-10's: K68.609) 65 y.o. female with abdominal pain/distension, nausea, and emesis found to have significant dilation of small bowel concerning for small bowel obstruction, potentially secondary to post-surgical adhesions although peritoneal carcinomatosis/malignancy progression would be most concerning especially with her oncologic history and CT findings.    - Unfortunately this is a very difficult situation and surgery is unlikely to offer any benefit given high concern this represents advancement of disease process / peritoneal carcinomatosis. She, and her family, seem understanding of this. Case was discussed with  Dr. Theora Gianotti ([facility], Gyn-Onc) yesterday who is in agreement and patient updated on this discussion.    - Continue NGT decompression to LIS; Monitor and record output  - Continue IVF support             - Appreciate palliative care support/recommendations             - Monitor abdominal examination; on-going bowel function             - Pain control prn  (minimize narcotics as feasible); antiemetics prn              - Serial KUBs as needed; ? Role for gastrografin challenge in 24-48 hours if no improvement, may not be of much added benefit given no beneficial role for surgical intervention.              - Mobilization encouraged as tolerated             - Further management per primary service   All of the above findings and recommendations were discussed with the patient, and the medical team, and all of patient's questions were answered to her expressed satisfaction.  -- Edison Simon, PA-C [facility] 10/29/2020, 7:01 AM [phone] M-F: 7am - 4pm

## 2020-10-29 NOTE — Progress Notes (Addendum)
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 In the emergency room, she was found to have distal small bowel obstruction thought to be from cancer.  Admitted with surgical consultation.  4/12-NG in place. No bm. No flatus  Assessment & Plan:   Principal Problem:   SBO (small bowel obstruction) (HCC) Active Problems:   Hypothyroidism   Hyperlipidemia   Cough   Abdominal pain   Malignant neoplasm of ovary (HCC)   Family history of breast cancer   Family history of lung cancer   COVID-19 vaccine series declined   Palliative care encounter   Malnutrition of moderate degree   Small bowel obstruction due to adhesions (HCC)  Small bowel obstruction from adhesions/suspect malignant obstruction: General surgery following Tolerating NG tube No flatus yet. Gynecology oncology following Suspect malignant obstruction patient may not have surgical options Palliative care on board please see note N.p.o., continue IV fluids  Progressive stage IV ovarian cancer: Patient last received chemotherapy with Taxol only on October 23, 2020.  Her next treatment was supposed to occur on October 30, 2020.  Given her progressive disease seen on CT scan from October 27, 2020, will discontinue this therapy Palliative on board discussion about hospice Patient and family hopeful for improvement. Palliative started patient on octreotide but patient was not interested in subcu injection was switched to IV Continue antiemetics analgesics encourage ambulation Per  oncology-Return to clinic next week after discharge for further evaluation and discussion of whether the use of single agent gemcitabine would be beneficial.  Appreciate palliative care input.    Acute kidney injury secondary to prerenal injury: Treated with IV fluids.   4/12-resolved and stable  Continue maintenance IV fluids     Peripheral neuropathy: On gabapentin    Hypothyroidism: On thyroxine. .   DVT prophylaxis:   Code Status: Full code Family Communication: Daughter at the bedside  Status is: Inpatient  The patient is inpatient as she requires IV treatments appropriate due to intensity of illness or inability to take PO and Inpatient level of care appropriate due to severity of illness  Dispo: The patient is from: Home              Anticipated d/c is to: Home              Patient currently is not medically stable to d/c.   Difficult to place patient No  Consultants:   General surgery  Palliative medicine  Oncology    Procedures:   None  Antimicrobials:   None   Subjective: No complaints this AM.  Denies abdominal pain.  Feels her abdomen is less distended.  No nausea or vomiting.  NG tube in place.  No flatus  Objective: Vitals:   10/28/20 2030 10/29/20 0540 10/29/20 0852 10/29/20 1100  BP: 122/74 130/78 135/75 134/73  Pulse: 89 84 79 80  Resp: 18 16 16 16   Temp: 97.7 F (36.5 C) 97.8 F (36.6 C) 98.5 F (36.9 C) 98.3 F (36.8 C)  TempSrc: Oral Oral Oral Oral  SpO2: 97% 99% 98% 98%  Weight:      Height:  Intake/Output Summary (Last 24 hours) at 10/29/2020 1630 Last data filed at 10/29/2020 1500 Gross per 24 hour  Intake 3036.51 ml  Output 750 ml  Net 2286.51 ml   Filed Weights   10/27/20 0915  Weight: 52.2 kg    Examination: Sitting in chair, calm comfortable NAD CTA no wheeze rales rhonchi's Regular S1-S2 no gallops Soft nontender decreased bowel sounds, positive distention No edema Awake alert oriented x3    Data  Reviewed: I have personally reviewed following labs and imaging studies  CBC: Recent Labs  Lab 10/23/20 0857 10/27/20 0942 10/28/20 0420  WBC 6.4 7.0 6.4  NEUTROABS 4.8  --   --   HGB 11.3* 12.7 11.2*  HCT 34.6* 38.7 34.4*  MCV 93.8 92.6 94.0  PLT 198 260 701   Basic Metabolic Panel: Recent Labs  Lab 10/23/20 0857 10/27/20 0942 10/28/20 0420  NA 138 136 138  K 4.0 4.1 4.0  CL 104 96* 102  CO2 25 27 26   GLUCOSE 106* 123* 116*  BUN 15 29* 33*  CREATININE 0.78 1.14* 0.87  CALCIUM 8.8* 9.1 8.4*   GFR: Estimated Creatinine Clearance: 55.3 mL/min (by C-G formula based on SCr of 0.87 mg/dL). Liver Function Tests: Recent Labs  Lab 10/23/20 0857 10/27/20 0942  AST 20 20  ALT 14 15  ALKPHOS 78 85  BILITOT 0.5 1.6*  PROT 6.7 7.5  ALBUMIN 3.6 3.9   Recent Labs  Lab 10/27/20 0942  LIPASE 25   No results for input(s): AMMONIA in the last 168 hours. Coagulation Profile: No results for input(s): INR, PROTIME in the last 168 hours. Cardiac Enzymes: No results for input(s): CKTOTAL, CKMB, CKMBINDEX, TROPONINI in the last 168 hours. BNP (last 3 results) No results for input(s): PROBNP in the last 8760 hours. HbA1C: No results for input(s): HGBA1C in the last 72 hours. CBG: No results for input(s): GLUCAP in the last 168 hours. Lipid Profile: No results for input(s): CHOL, HDL, LDLCALC, TRIG, CHOLHDL, LDLDIRECT in the last 72 hours. Thyroid Function Tests: No results for input(s): TSH, T4TOTAL, FREET4, T3FREE, THYROIDAB in the last 72 hours. Anemia Panel: No results for input(s): VITAMINB12, FOLATE, FERRITIN, TIBC, IRON, RETICCTPCT in the last 72 hours. Sepsis Labs: Recent Labs  Lab 10/27/20 7793  LATICACIDVEN 1.7    Recent Results (from the past 240 hour(s))  Resp Panel by RT-PCR (Flu A&B, Covid) Nasopharyngeal Swab     Status: None   Collection Time: 10/27/20  3:35 PM   Specimen: Nasopharyngeal Swab; Nasopharyngeal(NP) swabs in vial transport medium  Result  Value Ref Range Status   SARS Coronavirus 2 by RT PCR NEGATIVE NEGATIVE Final    Comment: (NOTE) SARS-CoV-2 target nucleic acids are NOT DETECTED.  The SARS-CoV-2 RNA is generally detectable in upper respiratory specimens during the acute phase of infection. The lowest concentration of SARS-CoV-2 viral copies this assay can detect is 138 copies/mL. A negative result does not preclude SARS-Cov-2 infection and should not be used as the sole basis for treatment or other patient management decisions. A negative result may occur with  improper specimen collection/handling, submission of specimen other than nasopharyngeal swab, presence of viral mutation(s) within the areas targeted by this assay, and inadequate number of viral copies(<138 copies/mL). A negative result must be combined with clinical observations, patient history, and epidemiological information. The expected result is Negative.  Fact Sheet for Patients:  EntrepreneurPulse.com.au  Fact Sheet for Healthcare Providers:  IncredibleEmployment.be  This test is no t yet approved or cleared  by the [place] and  has been authorized for detection and/or diagnosis of SARS-CoV-2 by FDA under an Emergency Use Authorization (EUA). This EUA will remain  in effect (meaning this test can be used) for the duration of the COVID-19 declaration under Section 564(b)(1) of the Act, 21 U.S.C.section 360bbb-3(b)(1), unless the authorization is terminated  or revoked sooner.       Influenza A by PCR NEGATIVE NEGATIVE Final   Influenza B by PCR NEGATIVE NEGATIVE Final    Comment: (NOTE) The Xpert Xpress SARS-CoV-2/FLU/RSV plus assay is intended as an aid in the diagnosis of influenza from Nasopharyngeal swab specimens and should not be used as a sole basis for treatment. Nasal washings and aspirates are unacceptable for Xpert Xpress SARS-CoV-2/FLU/RSV testing.  Fact Sheet for  Patients: EntrepreneurPulse.com.au  Fact Sheet for Healthcare Providers: IncredibleEmployment.be  This test is not yet approved or cleared by the [place] FDA and has been authorized for detection and/or diagnosis of SARS-CoV-2 by FDA under an Emergency Use Authorization (EUA). This EUA will remain in effect (meaning this test can be used) for the duration of the COVID-19 declaration under Section 564(b)(1) of the Act, 21 U.S.C. section 360bbb-3(b)(1), unless the authorization is terminated or revoked.  Performed at [facility], [address] [place], [place] [address]          Radiology Studies: DG Abd Portable 1V  Result Date: 10/28/2020 CLINICAL DATA:  Nasogastric tube placement EXAM: PORTABLE ABDOMEN - 1 VIEW COMPARISON:  10/02/2019 FINDINGS: Nasogastric tube tip overlies the proximal body of the stomach. The visualized abdominal gas pattern is normal. Pelvis excluded from view. IMPRESSION: Nasogastric tube tip overlying the proximal body of the stomach. Electronically Signed   By: Fidela Salisbury MD   On: 10/28/2020 05:58        Scheduled Meds: . Chlorhexidine Gluconate Cloth  6 each Topical Daily  . dexamethasone  4 mg Intravenous Q12H  . gabapentin  300 mg Oral QHS  . levothyroxine  75 mcg Oral QAC breakfast  .  morphine injection  4 mg Intravenous Once  . octreotide  100 mcg Intravenous TID   Continuous Infusions: . sodium chloride 125 mL/hr at 10/29/20 1500  . ondansetron (ZOFRAN) IV Stopped (10/29/20 1431)  . promethazine (PHENERGAN) injection (IM or IVPB) 12.5 mg (10/27/20 2226)     LOS: 1 day    Time spent: 35 minutes with more than 50% on [place], MD [facility] Pager [phone]

## 2020-10-29 NOTE — Progress Notes (Signed)
Patient ambulated about 960 ft around nurse's station independently with no complaints of pain or nausea.  Tolerated ambulation well.  Will continue to monitor.  Christene Slates

## 2020-10-30 ENCOUNTER — Inpatient Hospital Stay: Payer: Self-pay

## 2020-10-30 MED ORDER — ALPRAZOLAM 0.5 MG PO TABS
0.5000 mg | ORAL_TABLET | Freq: Two times a day (BID) | ORAL | Status: DC | PRN
  Filled 2020-10-30 (×2): qty 1

## 2020-10-30 MED ORDER — ENOXAPARIN SODIUM 40 MG/0.4ML ~~LOC~~ SOLN: 40.0000 mg | SUBCUTANEOUS | Status: DC

## 2020-10-30 NOTE — Progress Notes (Signed)
Patient had a medium sized bowel movement, soft blobs. Not passing gas yet. Will continue to monitor.  Christene Slates

## 2020-10-30 NOTE — Progress Notes (Signed)
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 In the emergency room, she was found to have distal small bowel obstruction thought to be from cancer.  Admitted with surgical consultation.  4/13-NGT clamped and subsequently removed and started on clear liquid diet per gsx.  Assessment & Plan:   Principal Problem:   SBO (small bowel obstruction) (HCC) Active Problems:   Hypothyroidism   Hyperlipidemia   Cough   Abdominal pain   Malignant neoplasm of ovary (HCC)   Family history of breast cancer   Family history of lung cancer   COVID-19 vaccine series declined   Palliative care encounter   Malnutrition of moderate degree   Small bowel obstruction due to adhesions (HCC)  Small bowel obstruction from adhesions/suspect malignant obstruction: General surgery following  NG tube discontinued this a.m.  GYN oncology following  Suspect malignant obstruction, patient may not have surgical options  Palliative care on board  Starting clear liquid diet per surgery's recommendation     Progressive stage IV ovarian cancer: Patient last received chemotherapy with Taxol only on October 23, 2020.  Her next treatment was supposed to occur on October 30, 2020.  Given her progressive disease seen on CT scan from October 27, 2020, will discontinue this therapy Palliative on board discussion about hospice Patient and family hopeful for improvement. Palliative started patient on octreotide but patient was not interested in subcu injection was switched to  IV Continue antiemetics analgesics encourage ambulation Per oncology-Return to clinic next week after discharge for further evaluation and discussion of whether the use of single agent gemcitabine would be beneficial.  Appreciate palliative care input. 4/13-patient was asking about octreotide, wanted to know why she was receiving it. Discussed this with palliative Josh Borders, explaining the reasoning with therapy with the patient.     Acute kidney injury secondary to prerenal azotemia  Treated with IV fluids  Renal function remained stable now       Peripheral neuropathy: On gabapentin    Hypothyroidism: On thyroxine     DVT prophylaxis: scd  Code Status: Full code Family Communication: Daughter at the bedside  Status is: Inpatient  The patient is inpatient as she requires IV treatments appropriate due to intensity of illness or inability to take PO and Inpatient level of care appropriate due to severity of illness  Dispo: The patient is from: Home              Anticipated d/c is to: Home              Patient currently is not medically stable to d/c.   Difficult to place patient No  Consultants:   General surgery  Palliative medicine  Oncology    Procedures:   None  Antimicrobials:   None   Subjective: No flatus. Minimal stool formed.  No abdominal pain or nausea  Objective: Vitals:   10/29/20 1100 10/29/20 2014 10/30/20 0336 10/30/20 0757  BP: 134/73 136/68 134/68 122/60  Pulse: 80 60 67 66  Resp: 16 20 16 20   Temp: 98.3 F (36.8 C) 98 F (36.7 C) 98 F (36.7 C) 97.6 F (  36.4 C)  TempSrc: Oral Oral Oral Oral  SpO2: 98% 98% 98% 100%  Weight:      Height:        Intake/Output Summary (Last 24 hours) at 10/30/2020 0828 Last data filed at 10/30/2020 0344 Gross per 24 hour  Intake 3251.43 ml  Output 650 ml  Net 2601.43 ml   Filed Weights   10/27/20 0915  Weight: 52.2 kg    Examination: Sitting in bed, calm, comfortable NG tube in  place CTA no wheeze rales rhonchi's Regular S1-S2 no gallops Soft nontender + extended decrease bowel sounds Trace pedal edema bilaterally, no calf tenderness Alert oriented x3 grossly intact Mood and affect appropriate in current setting    Data Reviewed: I have personally reviewed following labs and imaging studies  CBC: Recent Labs  Lab 10/23/20 0857 10/27/20 0942 10/28/20 0420  WBC 6.4 7.0 6.4  NEUTROABS 4.8  --   --   HGB 11.3* 12.7 11.2*  HCT 34.6* 38.7 34.4*  MCV 93.8 92.6 94.0  PLT 198 260 767   Basic Metabolic Panel: Recent Labs  Lab 10/23/20 0857 10/27/20 0942 10/28/20 0420  NA 138 136 138  K 4.0 4.1 4.0  CL 104 96* 102  CO2 25 27 26   GLUCOSE 106* 123* 116*  BUN 15 29* 33*  CREATININE 0.78 1.14* 0.87  CALCIUM 8.8* 9.1 8.4*   GFR: Estimated Creatinine Clearance: 55.3 mL/min (by C-G formula based on SCr of 0.87 mg/dL). Liver Function Tests: Recent Labs  Lab 10/23/20 0857 10/27/20 0942  AST 20 20  ALT 14 15  ALKPHOS 78 85  BILITOT 0.5 1.6*  PROT 6.7 7.5  ALBUMIN 3.6 3.9   Recent Labs  Lab 10/27/20 0942  LIPASE 25   No results for input(s): AMMONIA in the last 168 hours. Coagulation Profile: No results for input(s): INR, PROTIME in the last 168 hours. Cardiac Enzymes: No results for input(s): CKTOTAL, CKMB, CKMBINDEX, TROPONINI in the last 168 hours. BNP (last 3 results) No results for input(s): PROBNP in the last 8760 hours. HbA1C: No results for input(s): HGBA1C in the last 72 hours. CBG: No results for input(s): GLUCAP in the last 168 hours. Lipid Profile: No results for input(s): CHOL, HDL, LDLCALC, TRIG, CHOLHDL, LDLDIRECT in the last 72 hours. Thyroid Function Tests: No results for input(s): TSH, T4TOTAL, FREET4, T3FREE, THYROIDAB in the last 72 hours. Anemia Panel: No results for input(s): VITAMINB12, FOLATE, FERRITIN, TIBC, IRON, RETICCTPCT in the last 72 hours. Sepsis Labs: Recent Labs  Lab 10/27/20 2094  LATICACIDVEN 1.7     Recent Results (from the past 240 hour(s))  Resp Panel by RT-PCR (Flu A&B, Covid) Nasopharyngeal Swab     Status: None   Collection Time: 10/27/20  3:35 PM   Specimen: Nasopharyngeal Swab; Nasopharyngeal(NP) swabs in vial transport medium  Result Value Ref Range Status   SARS Coronavirus 2 by RT PCR NEGATIVE NEGATIVE Final    Comment: (NOTE) SARS-CoV-2 target nucleic acids are NOT DETECTED.  The SARS-CoV-2 RNA is generally detectable in upper respiratory specimens during the acute phase of infection. The lowest concentration of SARS-CoV-2 viral copies this assay can detect is 138 copies/mL. A negative result does not preclude SARS-Cov-2 infection and should not be used as the sole basis for treatment or other patient management decisions. A negative result may occur with  improper specimen collection/handling, submission of specimen other than nasopharyngeal swab, presence of viral mutation(s) within the areas targeted by this assay, and inadequate number of viral copies(<138  copies/mL). A negative result must be combined with clinical observations, patient history, and epidemiological information. The expected result is Negative.  Fact Sheet for Patients:  EntrepreneurPulse.com.au  Fact Sheet for Healthcare Providers:  IncredibleEmployment.be  This test is no t yet approved or cleared by the [place] FDA and  has been authorized for detection and/or diagnosis of SARS-CoV-2 by FDA under an Emergency Use Authorization (EUA). This EUA will remain  in effect (meaning this test can be used) for the duration of the COVID-19 declaration under Section 564(b)(1) of the Act, 21 U.S.C.section 360bbb-3(b)(1), unless the authorization is terminated  or revoked sooner.       Influenza A by PCR NEGATIVE NEGATIVE Final   Influenza B by PCR NEGATIVE NEGATIVE Final    Comment: (NOTE) The Xpert Xpress SARS-CoV-2/FLU/RSV plus assay is intended as an  aid in the diagnosis of influenza from Nasopharyngeal swab specimens and should not be used as a sole basis for treatment. Nasal washings and aspirates are unacceptable for Xpert Xpress SARS-CoV-2/FLU/RSV testing.  Fact Sheet for Patients: EntrepreneurPulse.com.au  Fact Sheet for Healthcare Providers: IncredibleEmployment.be  This test is not yet approved or cleared by the [place] FDA and has been authorized for detection and/or diagnosis of SARS-CoV-2 by FDA under an Emergency Use Authorization (EUA). This EUA will remain in effect (meaning this test can be used) for the duration of the COVID-19 declaration under Section 564(b)(1) of the Act, 21 U.S.C. section 360bbb-3(b)(1), unless the authorization is terminated or revoked.  Performed at [facility], [address] [place], [place] [address]          Radiology Studies: No results found.      Scheduled Meds: . Chlorhexidine Gluconate Cloth  6 each Topical Daily  . dexamethasone  4 mg Intravenous Q12H  . gabapentin  300 mg Oral QHS  . levothyroxine  75 mcg Oral QAC breakfast  .  morphine injection  4 mg Intravenous Once  . octreotide  100 mcg Intravenous TID   Continuous Infusions: . sodium chloride 125 mL/hr at 10/30/20 0338  . ondansetron (ZOFRAN) IV Stopped (10/29/20 1431)  . promethazine (PHENERGAN) injection (IM or IVPB) 12.5 mg (10/27/20 2226)     LOS: 2 days    Time spent: 35 minutes with more than 50% on [place], MD [facility] Pager [phone]

## 2020-10-30 NOTE — Progress Notes (Signed)
[facility] SURGICAL PROGRESS NOTE (cpt [phone])  Hospital Day(s): 2.   Interval History: Patient seen and examined, no acute events or new complaints overnight. Patient reports she continues to feel better. She denies abdominal pain, distension, nausea, emesis. No new labs. KUB this morning with improvement/resolution in obstruction pattern. NGT output recorded at 650 ccs in the last 24 hours. She did reportedly have a small formed BM but no flatus.   Review of Systems:  Constitutional: denies fever, chills  HEENT: denies cough or congestion  Respiratory: denies any shortness of breath  Cardiovascular: denies chest pain or palpitations  Gastrointestinal: denies abdominal pain, N/V, or diarrhea/and bowel function as per interval history Genitourinary: denies burning with urination or urinary frequency  Vital signs in last 24 hours: [min-max] current  Temp:  [98 F (36.7 C)-98.5 F (36.9 C)] 98 F (36.7 C) (04/13 0336) Pulse Rate:  [60-80] 67 (04/13 0336) Resp:  [16-20] 16 (04/13 0336) BP: (134-136)/(68-75) 134/68 (04/13 0336) SpO2:  [98 %] 98 % (04/13 0336)     Height: 5\' 6"  (167.6 cm) Weight: 52.2 kg BMI (Calculated): 18.57   Intake/Output last 2 shifts:  04/12 0701 - 04/13 0700 In: 3251.4 [I.V.:3089.4; IV Piggyback:162.1] Out: 650 [Emesis/NG output:650]   Physical Exam:  Constitutional: alert, cooperative and no distress, somewhat frail appearing HENT: normocephalic without obvious abnormality, NGT in place; clamped Eyes: PERRL, EOM's grossly intact and symmetric  Respiratory: breathing non-labored at rest  Cardiovascular: regular rate and sinus rhythm  Gastrointestinal: Soft, non-tender, and non-distended, no rebound/guarding. Previous laparotomy scars present,.  Musculoskeletal: no edema or wounds, motor and sensation grossly intact, NT    Labs:  CBC Latest Ref Rng & Units 10/28/2020 10/27/2020 10/23/2020  WBC 4.0 - 10.5 K/uL 6.4 7.0 6.4  Hemoglobin 12.0 -  15.0 g/dL 11.2(L) 12.7 11.3(L)  Hematocrit 36.0 - 46.0 % 34.4(L) 38.7 34.6(L)  Platelets 150 - 400 K/uL 228 260 198   CMP Latest Ref Rng & Units 10/28/2020 10/27/2020 10/23/2020  Glucose 70 - 99 mg/dL 116(H) 123(H) 106(H)  BUN 8 - 23 mg/dL 33(H) 29(H) 15  Creatinine 0.44 - 1.00 mg/dL 0.87 1.14(H) 0.78  Sodium 135 - 145 mmol/L 138 136 138  Potassium 3.5 - 5.1 mmol/L 4.0 4.1 4.0  Chloride 98 - 111 mmol/L 102 96(L) 104  CO2 22 - 32 mmol/L 26 27 25   Calcium 8.9 - 10.3 mg/dL 8.4(L) 9.1 8.8(L)  Total Protein 6.5 - 8.1 g/dL - 7.5 6.7  Total Bilirubin 0.3 - 1.2 mg/dL - 1.6(H) 0.5  Alkaline Phos 38 - 126 U/L - 85 78  AST 15 - 41 U/L - 20 20  ALT 0 - 44 U/L - 15 14     Imaging studies:   KUB (10/30/2020) personally reviewed without small bowel dilation, gas/stool throughout colon, and radiologist report reviewed:  IMPRESSION: Gastric catheter within the stomach. No obstructive changes are seen.   Assessment/Plan: (ICD-10's: K56.609) 65 y.o.femalewith abdominal pain/distension, nausea, and emesis found to have significant dilation of small bowel concerning for small bowel obstruction,potentiallysecondary to post-surgical adhesions although peritoneal carcinomatosis/malignancy progression would be most concerning especially with her oncologic historyand CT findings.   - Unfortunately this is a very difficult situation and surgery is unlikely to offer any benefit given high concern this represents advancement of disease process / peritoneal carcinomatosis. She, and her family, seem understanding of this. Case was discussed with Dr. Theora Gianotti ([facility], Gyn-Onc) on day of initial consult who is in agreement and patient updated on this  discussion.               - She has had clinical and radiographic improvement this morning. As such, will clamp NGT x4 hours. After 4 hours, we will check residuals and if less than 150 ccs we will remove NGT and initiate diet             - Continue IVF support;  wean - Appreciate palliative care support/recommendations -Monitor abdominal examination; on-going bowel function - Pain control prn (minimize narcotics as feasible); antiemetics prn  -Serial KUBs as needed - Mobilization encouraged as tolerated - Further management per primary service; we will follow   All of the above findings and recommendations were discussed with the patient, and the medical team, and all of patient's questions were answered to her expressed satisfaction.  -- Edison Simon, PA-C [facility] 10/30/2020, 7:27 AM [phone] M-F: 7am - 4pm

## 2020-10-30 NOTE — Progress Notes (Signed)
NGT clamped for 3.5 hours this morning. Per verbal order from PA Mina, NGT hooked to suction and turned on for 3 minutes at 1215 today with only about 60mL output. NGT removed per verbal order; clear liquid diet ordered at this time. No complaints or concerns from patient or nursing at this time.

## 2020-10-31 MED ORDER — DEXAMETHASONE 4 MG PO TABS: 4.0000 mg | ORAL_TABLET | Freq: Every day | ORAL | 11 refills | Status: DC

## 2020-10-31 MED ORDER — HEPARIN SOD (PORK) LOCK FLUSH 100 UNIT/ML IV SOLN
500.0000 [IU] | INTRAVENOUS | Status: AC | PRN
  Administered 2020-10-31: 500 [IU]
  Filled 2020-10-31: qty 5

## 2020-10-31 MED ORDER — FAMOTIDINE 20 MG PO TABS: 20.0000 mg | ORAL_TABLET | Freq: Every day | ORAL | 0 refills | Status: DC

## 2020-10-31 NOTE — Progress Notes (Signed)
[facility] (cpt [phone])  Hospital Day(s): 3.    Interval History: Patient seen and examined, no acute events or new complaints overnight. Patient reports she is feeling great this morning. She denies fever, chills, nausea, emesis, abdominal pain, nor distension. No new labs or imaging this morning. NGT removed yesterday after passing clamping trial. She has tolerated CLD to this point and had BM. She has since started passing flatus. No issues with ambulation. She is very anxious to go home.   Review of Systems:  Constitutional: denies fever, chills  HEENT: denies cough or congestion  Respiratory: denies any shortness of breath  Cardiovascular: denies chest pain or palpitations  Gastrointestinal: denies abdominal pain, N/V, or diarrhea/and bowel function as per interval history Genitourinary: denies burning with urination or urinary frequency  Vital signs in last 24 hours: [min-max] current  Temp:  [97.6 F (36.4 C)-98.7 F (37.1 C)] 98.1 F (36.7 C) (04/14 3664) Pulse Rate:  [48-66] 58 (04/14 0632) Resp:  [16-20] 18 (04/14 4034) BP: (120-149)/(60-73) 120/73 (04/14 7425) SpO2:  [98 %-100 %] 98 % (04/14 9563)     Height: 5\' 6"  (167.6 cm) Weight: 52.2 kg BMI (Calculated): 18.57   Intake/Output last 2 shifts:  04/13 0701 - 04/14 0700 In: 2875.8 [P.O.:120; I.V.:2755.8] Out: -    Physical Exam:  Constitutional: alert, cooperative and no distress, somewhat frail appearing HENT: normocephalic without obvious abnormality, NGT in place; clamped Eyes: PERRL, EOM's grossly intact and symmetric  Respiratory: breathing non-labored at rest  Cardiovascular: regular rate and sinus rhythm  Gastrointestinal:Soft, non-tender, and non-distended, no rebound/guarding. Previous laparotomy scars present,. Musculoskeletal: no edema or wounds, motor and sensation grossly intact, NT   Labs:  CBC Latest Ref Rng & Units 10/28/2020 10/27/2020 10/23/2020  WBC 4.0 -  10.5 K/uL 6.4 7.0 6.4  Hemoglobin 12.0 - 15.0 g/dL 11.2(L) 12.7 11.3(L)  Hematocrit 36.0 - 46.0 % 34.4(L) 38.7 34.6(L)  Platelets 150 - 400 K/uL 228 260 198   CMP Latest Ref Rng & Units 10/28/2020 10/27/2020 10/23/2020  Glucose 70 - 99 mg/dL 116(H) 123(H) 106(H)  BUN 8 - 23 mg/dL 33(H) 29(H) 15  Creatinine 0.44 - 1.00 mg/dL 0.87 1.14(H) 0.78  Sodium 135 - 145 mmol/L 138 136 138  Potassium 3.5 - 5.1 mmol/L 4.0 4.1 4.0  Chloride 98 - 111 mmol/L 102 96(L) 104  CO2 22 - 32 mmol/L 26 27 25   Calcium 8.9 - 10.3 mg/dL 8.4(L) 9.1 8.8(L)  Total Protein 6.5 - 8.1 g/dL - 7.5 6.7  Total Bilirubin 0.3 - 1.2 mg/dL - 1.6(H) 0.5  Alkaline Phos 38 - 126 U/L - 85 78  AST 15 - 41 U/L - 20 20  ALT 0 - 44 U/L - 15 14     Imaging studies: No new pertinent imaging studies   Assessment/Plan: (ICD-10's: K56.609) 65 y.o.femalewith return of bowel function admitted with small bowel obstruction,potentiallysecondary to post-surgical adhesions although peritoneal carcinomatosis/malignancy progression would be most concerning especially with her oncologic historyand CT findings.   - Okay to advance to full liquid diet; may understands she may need to stick to full liquids or transition to smaller more frequent meals at home.    - Appreciate palliative care support/recommendations -Monitor abdominal examination; on-going bowel function - Pain control prn (minimize narcoticsas feasible); antiemetics prn  -Serial KUBs as needed - Mobilization encouraged as tolerated - Further management per primary service     - Discharge Planning: I think it would be reasonable to discharge today.  I did spend more time discussion continuing full liquid diet at home and eating smaller more frequent meals. She does have follow up with Dr Theora Gianotti scheduled for next week.    All of the above findings and recommendations were discussed with the patient, and the medical  team, and all of patient's questions were answered to her expressed satisfaction.  -- Edison Simon, PA-C [facility] 10/31/2020, 7:13 AM [phone] M-F: 7am - 4pm

## 2020-10-31 NOTE — Care Management (Signed)
Attempted to see patient prior to discharge to discuss resources and medication assessment.  Per nurse patient declined to see TOC prior to discharge

## 2020-10-31 NOTE — Discharge Instructions (Signed)
In addition to included general instructions for bowel obstruction,  Diet: Gradually advance diet at home. You have no true dietary restrictions. Liquid (broth, jello) or full liquids (yogurt, grits, oatmeal) will be easier to digest and pass at first. Softer foods (eggs, mashed potatoes) are a great first step in return to 'normal' foods. Additionally, it may be best to eat smaller and more frequent meals. Remember that these obstructions are not caused by anything you eat or do.   Activity: Okay to resume normal activity  Call office [phone] / [phone]) at any time if any questions, worsening pain, fevers/chills, bleeding, drainage from incision site, or other concerns.

## 2020-10-31 NOTE — Plan of Care (Signed)

## 2020-10-31 NOTE — Progress Notes (Signed)
Patient made aware that Case Management wanted to see her regarding medications.  Patient became very upset stated at this point there was nothing else that could be done to help.  Port site benign after VAST removed port per protocol.  Reviewed discharge instructions

## 2020-10-31 NOTE — Discharge Summary (Signed)
Amanda Pena KDX:[id number] DOB: 05/19/1956 DOA: 10/27/2020  PCP: Cassandria Anger, MD  Admit date: 10/27/2020 Discharge date: 10/31/2020  Admitted From: Home Disposition: Home  Recommendations for Outpatient Follow-up:  1. Follow up with PCP in 1 week 2. Please obtain BMP/CBC in one week 3. Follow-up with general surgery in 1 week 4. Follow-up with Dr. Grayland Ormond and Merr[facility] next week     Discharge Condition:Stable CODE STATUS: Full Diet recommendation: Full liquid   Brief/Interim Summary: Per HPI: Amanda Pena is a 65 y.o. 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  Admitted with surgical consultation.  She had an NG tube placed.  Small bowel obstruction from adhesions/suspect malignant obstruction: General surgery was consulted and following NG tube was placed and then taken out with patient tolerating GYN oncology following  Suspect malignant obstruction, patient may not have surgical options  Palliative care on board  Was started on clear liquids and tolerated. General surgery was okay for patient to be discharged home on full liquid diet and eventually eating smaller more frequent meals.  She will follow-up with surgery next week and Dr. Theora Gianotti.    Progressive stage IV ovarian cancer: Patient last received chemotherapy with Taxol only on October 23, 2020. Her next treatment was supposed to occur on October 30, 2020. Given her progressive disease seen on CT scan from October 27, 2020,  will discontinue this therapy Palliative on board discussion about hospice Patient and family hopeful for improvement. Per oncology-Return to clinic next week after discharge for further evaluation and discussion of whether the use of single agent gemcitabine would be beneficial. Appreciate palliative care input. She will also follow-up with [facility] palliative care as discharge Dr. Grayland Ormond wanted patient to be discharged on Decadron 4 mg daily Dr. Grayland Ormond did not want patient to be discharged on octreotide subcu    Acute kidney injury secondary to prerenal azotemia  Treated with IV fluids  Renal function remained stable  Monitor as outpatient      Peripheral neuropathy: On gabapentin    Hypothyroidism: On thyroxine      Discharge Diagnoses:  Principal Problem:   SBO (small bowel obstruction) (HCC) Active Problems:   Hypothyroidism   Hyperlipidemia   Cough   Abdominal pain   Malignant neoplasm of ovary (HCC)   Family history of breast cancer   Family history of lung cancer   COVID-19 vaccine series declined   Palliative care encounter   Malnutrition of moderate degree   Small bowel obstruction due to adhesions [facility])    Discharge Instructions   Allergies as of 10/31/2020   No Known Allergies     Medication List    STOP taking these medications   promethazine 25 MG tablet Commonly known as: PHENERGAN   senna-docusate 8.6-50 MG tablet Commonly known as: Senokot-S     TAKE these medications   20/20 ARTIFICIAL TEARS OP Apply 2 drops to eye daily as needed.   ALPRAZolam 0.5 MG tablet Commonly known as: XANAX Take 1 tablet (0.5 mg total) by mouth 2 (two) times daily as needed for anxiety.  amitriptyline 50 MG tablet Commonly known as: ELAVIL Take 1 tablet (50 mg total) by mouth at bedtime.   dexamethasone 4 MG tablet Commonly known as: Decadron Take 1 tablet (4 mg total) by mouth daily.   famotidine 20 MG tablet Commonly  known as: PEPCID Take 1 tablet (20 mg total) by mouth daily.   Fish Oil 1000 MG Caps Take 2 capsules by mouth daily.   gabapentin 300 MG capsule Commonly known as: NEURONTIN Take 1 capsule (300 mg total) by mouth at bedtime.   lidocaine-prilocaine cream Commonly known as: EMLA Apply generously to the port area 45-60 mins prior.   MELATONIN PO Take 5 mg by mouth daily as needed.   ondansetron 8 MG tablet Commonly known as: ZOFRAN One pill every 8 hours as needed for nausea/vomitting.   polyethylene glycol 17 g packet Commonly known as: MIRALAX / GLYCOLAX Take 17 g by mouth daily as needed.   Probiotic-10 Chew Chew 1 capsule by mouth.   prochlorperazine 10 MG tablet Commonly known as: COMPAZINE Take 1 tablet (10 mg total) by mouth every 6 (six) hours as needed for nausea or vomiting.   Synthroid 75 MCG tablet Generic drug: levothyroxine Take 1 tablet (75 mcg total) by mouth daily before breakfast.   vitamin C 1000 MG tablet Take 1,000 mg by mouth in the morning and at bedtime.   Vitamin D3 50 MCG (2000 UT) Tabs Take 1 tablet by mouth in the morning and at bedtime.   zolpidem 10 MG tablet Commonly known as: AMBIEN TAKE 1 TABLET BY MOUTH EVERY DAY AT BEDTIME AS NEEDED FOR SLEEP       Follow-up Information    [place], Venida Jarvis, MD. Go in 1 week(s).   Specialties: Obstetrics, Gynecologic Oncology Why: Go to your appointment with Dr Higinio Plan as scheduled next week Contact information: [facility] [address] [place] [address] [phone]        Lloyd Huger, MD Follow up.   Specialty: Oncology Why: 4/19 - with Billey Chang and Dr. Grayland Ormond. Contact information: [place] [address] [phone]        Plotnikov, [name], MD Follow up in 1 week(s).   Specialty: Internal Medicine Contact information: [place] [address] [phone]              No Known  Allergies  Consultations:  Palliative care, general surgery, oncology   Procedures/Studies: CT ABDOMEN PELVIS W CONTRAST  Result Date: 10/27/2020 CLINICAL DATA:  Abdominal pain vomiting.  History of ovarian cancer. EXAM: CT ABDOMEN AND PELVIS WITH CONTRAST TECHNIQUE: Multidetector CT imaging of the abdomen and pelvis was performed using the standard protocol following bolus administration of intravenous contrast. CONTRAST:  26mL OMNIPAQUE IOHEXOL 300 MG/ML  SOLN COMPARISON:  PET-CT 09/12/2020.  Abdomen/pelvis CT 12/01/2019 FINDINGS: Lower chest: Fluid is identified around the distal esophagus. Hepatobiliary: Periportal edema noted. New 3.5 x 3.4 cm heterogeneous lesion is identified in the left liver near the junction of the medial and lateral segments. There is a new heterogeneous lesion in the posterior right liver measuring 2.2 cm on image 21/2. Gallbladder unremarkable. No intrahepatic or extrahepatic biliary dilation. Pancreas: No focal mass lesion. No dilatation of the main duct. No intraparenchymal cyst. No peripancreatic edema. Spleen: No splenomegaly. No focal mass lesion. Adrenals/Urinary Tract: No adrenal nodule or mass. 8 mm probable cyst upper pole right kidney is similar to prior. Left kidney unremarkable. No evidence for hydroureter. The urinary  bladder appears normal for the degree of distention. Stomach/Bowel: Possible wall thickening at the distal esophagus/esophagogastric junction. Stomach otherwise unremarkable. Duodenum is normally positioned as is the ligament of Treitz. Diffuse small bowel dilatation measures up to 5 cm diameter in the right pelvis (image 57/2). A discrete transition zone is not identified, but distal small bowel and terminal ileum are decompressed. Terminal ileum is visible on coronal image 45/3. Colon is nondilated. Rectal anastomosis evident. Vascular/Lymphatic: There is abdominal aortic atherosclerosis without aneurysm. Calcified retroperitoneal nodes are similar  to PET-CT from 6 weeks ago. Portal vein and superior mesenteric vein are patent. Splenic vein is patent. No pelvic sidewall lymphadenopathy. Reproductive: Uterus surgically absent.  There is no adnexal mass. Other: Large volume intraperitoneal free fluid is similar to 09/12/2020. Small mixed attenuation left perirectal nodule is stable. No discrete measurable peritoneal nodule is evident. Musculoskeletal: No worrisome lytic or sclerotic osseous abnormality. IMPRESSION: 1. Interval development of diffuse small bowel dilatation up to 5 cm diameter with decompressed distal small bowel and terminal ileum. A discrete transition zone is not identified, but findings are compatible with distal small bowel obstruction. 2. Left hepatic lobe lesion is progressive since PET-CT 6 weeks ago, demonstrated to be hypermetabolic and consistent with peritoneal disease. There is a new lesion in the posterior right liver compatible with metastatic deposit. 3. Large volume intraperitoneal free fluid is similar to 09/12/2020. As before, fluid generates mass-effect on the liver capsule. 4. Possible wall thickening at the distal esophagus/esophagogastric junction. Esophagitis would be a consideration although tumor involvement not excluded. 5. Stable small mixed attenuation left perirectal nodule. 6. Aortic Atherosclerosis (ICD10-I70.0). Electronically Signed   By: Misty Stanley M.D.   On: 10/27/2020 13:31   DG Abd 2 Views  Result Date: 10/30/2020 CLINICAL DATA:  Small-bowel obstruction EXAM: ABDOMEN - 2 VIEW COMPARISON:  10/28/2020 FINDINGS: Gastric catheter is noted within the stomach stable in appearance from the prior exam. Scattered large and small bowel gas is seen. Fecal material is noted throughout the colon. No obstructive changes are seen. No free air is noted. IMPRESSION: Gastric catheter within the stomach. No obstructive changes are seen. Electronically Signed   By: Inez Catalina M.D.   On: 10/30/2020 08:42   DG Abd  Portable 1V  Result Date: 10/28/2020 CLINICAL DATA:  Nasogastric tube placement EXAM: PORTABLE ABDOMEN - 1 VIEW COMPARISON:  10/02/2019 FINDINGS: Nasogastric tube tip overlies the proximal body of the stomach. The visualized abdominal gas pattern is normal. Pelvis excluded from view. IMPRESSION: Nasogastric tube tip overlying the proximal body of the stomach. Electronically Signed   By: Fidela Salisbury MD   On: 10/28/2020 05:58       Subjective: Feels well, packed and ready to go.  Would like to go home.  Denies any abdominal pain, nausea or vomiting  Discharge Exam: Vitals:   10/31/20 0632 10/31/20 0734  BP: 120/73 106/73  Pulse: (!) 58 (!) 53  Resp: 18 18  Temp: 98.1 F (36.7 C) 97.7 F (36.5 C)  SpO2: 98% 100%   Vitals:   10/30/20 2019 10/30/20 2342 10/31/20 0632 10/31/20 0734  BP: 131/68  120/73 106/73  Pulse: (!) 48  (!) 58 (!) 53  Resp: 18 16 18 18   Temp: 98.2 F (36.8 C)  98.1 F (36.7 C) 97.7 F (36.5 C)  TempSrc: Oral   Oral  SpO2: 100%  98% 100%  Weight:      Height:        General: Pt is alert, awake,  not in acute distress Cardiovascular: RRR, S1/S2 +, no rubs, no gallops Respiratory: CTA bilaterally, no wheezing, no rhonchi Abdominal: Soft, NT, distended, positive bowel sounds but decreased Extremities: no edema, no cyanosis    The results of significant diagnostics from this hospitalization (including imaging, microbiology, ancillary and laboratory) are listed below for reference.     Microbiology: Recent Results (from the past 240 hour(s))  Resp Panel by RT-PCR (Flu A&B, Covid) Nasopharyngeal Swab     Status: None   Collection Time: 10/27/20  3:35 PM   Specimen: Nasopharyngeal Swab; Nasopharyngeal(NP) swabs in vial transport medium  Result Value Ref Range Status   SARS Coronavirus 2 by RT PCR NEGATIVE NEGATIVE Final    Comment: (NOTE) SARS-CoV-2 target nucleic acids are NOT DETECTED.  The SARS-CoV-2 RNA is generally detectable in upper  respiratory specimens during the acute phase of infection. The lowest concentration of SARS-CoV-2 viral copies this assay can detect is 138 copies/mL. A negative result does not preclude SARS-Cov-2 infection and should not be used as the sole basis for treatment or other patient management decisions. A negative result may occur with  improper specimen collection/handling, submission of specimen other than nasopharyngeal swab, presence of viral mutation(s) within the areas targeted by this assay, and inadequate number of viral copies(<138 copies/mL). A negative result must be combined with clinical observations, patient history, and epidemiological information. The expected result is Negative.  Fact Sheet for Patients:  EntrepreneurPulse.com.au  Fact Sheet for Healthcare Providers:  IncredibleEmployment.be  This test is no t yet approved or cleared by the [place] FDA and  has been authorized for detection and/or diagnosis of SARS-CoV-2 by FDA under an Emergency Use Authorization (EUA). This EUA will remain  in effect (meaning this test can be used) for the duration of the COVID-19 declaration under Section 564(b)(1) of the Act, 21 U.S.C.section 360bbb-3(b)(1), unless the authorization is terminated  or revoked sooner.       Influenza A by PCR NEGATIVE NEGATIVE Final   Influenza B by PCR NEGATIVE NEGATIVE Final    Comment: (NOTE) The Xpert Xpress SARS-CoV-2/FLU/RSV plus assay is intended as an aid in the diagnosis of influenza from Nasopharyngeal swab specimens and should not be used as a sole basis for treatment. Nasal washings and aspirates are unacceptable for Xpert Xpress SARS-CoV-2/FLU/RSV testing.  Fact Sheet for Patients: EntrepreneurPulse.com.au  Fact Sheet for Healthcare Providers: IncredibleEmployment.be  This test is not yet approved or cleared by the [place] FDA and has been  authorized for detection and/or diagnosis of SARS-CoV-2 by FDA under an Emergency Use Authorization (EUA). This EUA will remain in effect (meaning this test can be used) for the duration of the COVID-19 declaration under Section 564(b)(1) of the Act, 21 U.S.C. section 360bbb-3(b)(1), unless the authorization is terminated or revoked.  Performed at [facility], [address] [place], [place] [address]      Labs: BNP (last 3 results) No results for input(s): BNP in the last 8760 hours. Basic Metabolic Panel: Recent Labs  Lab 10/27/20 0942 10/28/20 0420  NA 136 138  K 4.1 4.0  CL 96* 102  CO2 27 26  GLUCOSE 123* 116*  BUN 29* 33*  CREATININE 1.14* 0.87  CALCIUM 9.1 8.4*   Liver Function Tests: Recent Labs  Lab 10/27/20 0942  AST 20  ALT 15  ALKPHOS 85  BILITOT 1.6*  PROT 7.5  ALBUMIN 3.9   Recent Labs  Lab 10/27/20 0942  LIPASE 25   No results for  input(s): AMMONIA in the last 168 hours. CBC: Recent Labs  Lab 10/27/20 0942 10/28/20 0420  WBC 7.0 6.4  HGB 12.7 11.2*  HCT 38.7 34.4*  MCV 92.6 94.0  PLT 260 228   Cardiac Enzymes: No results for input(s): CKTOTAL, CKMB, CKMBINDEX, TROPONINI in the last 168 hours. BNP: Invalid input(s): POCBNP CBG: No results for input(s): GLUCAP in the last 168 hours. D-Dimer No results for input(s): DDIMER in the last 72 hours. Hgb A1c No results for input(s): HGBA1C in the last 72 hours. Lipid Profile No results for input(s): CHOL, HDL, LDLCALC, TRIG, CHOLHDL, LDLDIRECT in the last 72 hours. Thyroid function studies No results for input(s): TSH, T4TOTAL, T3FREE, THYROIDAB in the last 72 hours.  Invalid input(s): FREET3 Anemia work up No results for input(s): VITAMINB12, FOLATE, FERRITIN, TIBC, IRON, RETICCTPCT in the last 72 hours. Urinalysis    Component Value Date/Time   COLORURINE YELLOW (A) 10/27/2020 1255   APPEARANCEUR HAZY (A) 10/27/2020 1255   LABSPEC >1.046 (H) 10/27/2020 1255   PHURINE  5.0 10/27/2020 1255   GLUCOSEU NEGATIVE 10/27/2020 1255   GLUCOSEU NEGATIVE 06/13/2019 1517   HGBUR NEGATIVE 10/27/2020 [place] 10/27/2020 1255   KETONESUR 80 (A) 10/27/2020 1255   PROTEINUR 30 (A) 10/27/2020 1255   UROBILINOGEN 1.0 06/13/2019 1517   NITRITE NEGATIVE 10/27/2020 1255   LEUKOCYTESUR NEGATIVE 10/27/2020 1255   Sepsis Labs Invalid input(s): PROCALCITONIN,  WBC,  LACTICIDVEN Microbiology Recent Results (from the past 240 hour(s))  Resp Panel by RT-PCR (Flu A&B, Covid) Nasopharyngeal Swab     Status: None   Collection Time: 10/27/20  3:35 PM   Specimen: Nasopharyngeal Swab; Nasopharyngeal(NP) swabs in vial transport medium  Result Value Ref Range Status   SARS Coronavirus 2 by RT PCR NEGATIVE NEGATIVE Final    Comment: (NOTE) SARS-CoV-2 target nucleic acids are NOT DETECTED.  The SARS-CoV-2 RNA is generally detectable in upper respiratory specimens during the acute phase of infection. The lowest concentration of SARS-CoV-2 viral copies this assay can detect is 138 copies/mL. A negative result does not preclude SARS-Cov-2 infection and should not be used as the sole basis for treatment or other patient management decisions. A negative result may occur with  improper specimen collection/handling, submission of specimen other than nasopharyngeal swab, presence of viral mutation(s) within the areas targeted by this assay, and inadequate number of viral copies(<138 copies/mL). A negative result must be combined with clinical observations, patient history, and epidemiological information. The expected result is Negative.  Fact Sheet for Patients:  EntrepreneurPulse.com.au  Fact Sheet for Healthcare Providers:  IncredibleEmployment.be  This test is no t yet approved or cleared by the [place] FDA and  has been authorized for detection and/or diagnosis of SARS-CoV-2 by FDA under an Emergency Use Authorization  (EUA). This EUA will remain  in effect (meaning this test can be used) for the duration of the COVID-19 declaration under Section 564(b)(1) of the Act, 21 U.S.C.section 360bbb-3(b)(1), unless the authorization is terminated  or revoked sooner.       Influenza A by PCR NEGATIVE NEGATIVE Final   Influenza B by PCR NEGATIVE NEGATIVE Final    Comment: (NOTE) The Xpert Xpress SARS-CoV-2/FLU/RSV plus assay is intended as an aid in the diagnosis of influenza from Nasopharyngeal swab specimens and should not be used as a sole basis for treatment. Nasal washings and aspirates are unacceptable for Xpert Xpress SARS-CoV-2/FLU/RSV testing.  Fact Sheet for Patients: EntrepreneurPulse.com.au  Fact Sheet for Healthcare Providers: IncredibleEmployment.be  This test is not yet approved or cleared by the [place] and has been authorized for detection and/or diagnosis of SARS-CoV-2 by FDA under an Emergency Use Authorization (EUA). This EUA will remain in effect (meaning this test can be used) for the duration of the COVID-19 declaration under Section 564(b)(1) of the Act, 21 U.S.C. section 360bbb-3(b)(1), unless the authorization is terminated or revoked.  Performed at [facility], [address], [place], [place] [address]      Time coordinating discharge: Over 30 minutes  SIGNED:   Nolberto Hanlon, MD  [facility] 10/31/2020, 9:31 AM Pager   If 7PM-7AM, please contact night-coverage [email or web address] Password [email or web address]

## 2020-10-31 NOTE — Plan of Care (Signed)
  Problem: Education: Goal: Knowledge of General Education information will improve Description: Including pain rating scale, medication(s)/side effects and non-pharmacologic comfort measures 10/31/2020 1037 by Vivien Rota, RN Outcome: Adequate for Discharge 10/31/2020 0802 by Vivien Rota, RN Outcome: Progressing   Problem: Health Behavior/Discharge Planning: Goal: Ability to manage health-related needs will improve 10/31/2020 1037 by Lyn Deemer, Winifred Olive, RN Outcome: Adequate for Discharge 10/31/2020 0802 by Vivien Rota, RN Outcome: Progressing   Problem: Clinical Measurements: Goal: Ability to maintain clinical measurements within normal limits will improve 10/31/2020 1037 by Vivien Rota, RN Outcome: Adequate for Discharge 10/31/2020 0802 by Vivien Rota, RN Outcome: Progressing Goal: Will remain free from infection 10/31/2020 1037 by Vivien Rota, RN Outcome: Adequate for Discharge 10/31/2020 0802 by Vivien Rota, RN Outcome: Progressing Goal: Diagnostic test results will improve 10/31/2020 1037 by Vivien Rota, RN Outcome: Adequate for Discharge 10/31/2020 0802 by Vivien Rota, RN Outcome: Progressing Goal: Respiratory complications will improve 10/31/2020 1037 by Vivien Rota, RN Outcome: Adequate for Discharge 10/31/2020 0802 by Vivien Rota, RN Outcome: Progressing Goal: Cardiovascular complication will be avoided 10/31/2020 1037 by Vivien Rota, RN Outcome: Adequate for Discharge 10/31/2020 0802 by Vivien Rota, RN Outcome: Progressing   Problem: Nutrition: Goal: Adequate nutrition will be maintained 10/31/2020 1037 by Vivien Rota, RN Outcome: Adequate for Discharge 10/31/2020 0802 by Vivien Rota, RN Outcome: Progressing   Problem: Coping: Goal: Level of anxiety will decrease 10/31/2020 1037 by Vivien Rota, RN Outcome: Adequate for  Discharge 10/31/2020 0802 by Vivien Rota, RN Outcome: Progressing

## 2020-11-04 ENCOUNTER — Inpatient Hospital Stay: Payer: Self-pay

## 2020-11-04 ENCOUNTER — Inpatient Hospital Stay
Admission: EM | Admit: 2020-11-04 | Discharge: 2020-11-07 | DRG: 389 | Disposition: A | Discharge status: Home Health | Payer: Self-pay | Attending: Internal Medicine | Admitting: Internal Medicine

## 2020-11-04 ENCOUNTER — Other Ambulatory Visit: Payer: Self-pay

## 2020-11-04 ENCOUNTER — Emergency Department: Payer: Self-pay

## 2020-11-04 DIAGNOSIS — R180 Malignant ascites: Secondary | ICD-10-CM | POA: Diagnosis present

## 2020-11-04 DIAGNOSIS — E785 Hyperlipidemia, unspecified: Secondary | ICD-10-CM | POA: Diagnosis present

## 2020-11-04 DIAGNOSIS — K56609 Unspecified intestinal obstruction, unspecified as to partial versus complete obstruction: Secondary | ICD-10-CM

## 2020-11-04 DIAGNOSIS — Z79899 Other long term (current) drug therapy: Secondary | ICD-10-CM

## 2020-11-04 DIAGNOSIS — E440 Moderate protein-calorie malnutrition: Secondary | ICD-10-CM | POA: Diagnosis present

## 2020-11-04 DIAGNOSIS — E890 Postprocedural hypothyroidism: Secondary | ICD-10-CM | POA: Diagnosis present

## 2020-11-04 DIAGNOSIS — C787 Secondary malignant neoplasm of liver and intrahepatic bile duct: Secondary | ICD-10-CM | POA: Diagnosis present

## 2020-11-04 DIAGNOSIS — Z803 Family history of malignant neoplasm of breast: Secondary | ICD-10-CM

## 2020-11-04 DIAGNOSIS — Z9049 Acquired absence of other specified parts of digestive tract: Secondary | ICD-10-CM

## 2020-11-04 DIAGNOSIS — E860 Dehydration: Secondary | ICD-10-CM | POA: Diagnosis present

## 2020-11-04 DIAGNOSIS — G620 Drug-induced polyneuropathy: Secondary | ICD-10-CM | POA: Diagnosis present

## 2020-11-04 DIAGNOSIS — E039 Hypothyroidism, unspecified: Secondary | ICD-10-CM | POA: Diagnosis present

## 2020-11-04 DIAGNOSIS — Z90710 Acquired absence of both cervix and uterus: Secondary | ICD-10-CM

## 2020-11-04 DIAGNOSIS — Z79890 Hormone replacement therapy (postmenopausal): Secondary | ICD-10-CM

## 2020-11-04 DIAGNOSIS — Z681 Body mass index (BMI) 19 or less, adult: Secondary | ICD-10-CM

## 2020-11-04 DIAGNOSIS — R739 Hyperglycemia, unspecified: Secondary | ICD-10-CM | POA: Diagnosis present

## 2020-11-04 DIAGNOSIS — T451X5A Adverse effect of antineoplastic and immunosuppressive drugs, initial encounter: Secondary | ICD-10-CM | POA: Diagnosis present

## 2020-11-04 DIAGNOSIS — K565 Intestinal adhesions [bands], unspecified as to partial versus complete obstruction: Secondary | ICD-10-CM

## 2020-11-04 DIAGNOSIS — Z66 Do not resuscitate: Secondary | ICD-10-CM | POA: Diagnosis present

## 2020-11-04 DIAGNOSIS — Z801 Family history of malignant neoplasm of trachea, bronchus and lung: Secondary | ICD-10-CM

## 2020-11-04 DIAGNOSIS — C569 Malignant neoplasm of unspecified ovary: Secondary | ICD-10-CM | POA: Diagnosis present

## 2020-11-04 DIAGNOSIS — Z85850 Personal history of malignant neoplasm of thyroid: Secondary | ICD-10-CM

## 2020-11-04 DIAGNOSIS — F419 Anxiety disorder, unspecified: Secondary | ICD-10-CM | POA: Diagnosis present

## 2020-11-04 DIAGNOSIS — Z20822 Contact with and (suspected) exposure to covid-19: Secondary | ICD-10-CM | POA: Diagnosis present

## 2020-11-04 DIAGNOSIS — Z90722 Acquired absence of ovaries, bilateral: Secondary | ICD-10-CM

## 2020-11-04 DIAGNOSIS — C799 Secondary malignant neoplasm of unspecified site: Secondary | ICD-10-CM

## 2020-11-04 DIAGNOSIS — Z515 Encounter for palliative care: Secondary | ICD-10-CM

## 2020-11-04 DIAGNOSIS — I444 Left anterior fascicular block: Secondary | ICD-10-CM | POA: Diagnosis present

## 2020-11-04 DIAGNOSIS — I700 Atherosclerosis of aorta: Secondary | ICD-10-CM | POA: Diagnosis present

## 2020-11-04 DIAGNOSIS — F32A Depression, unspecified: Secondary | ICD-10-CM | POA: Diagnosis present

## 2020-11-04 DIAGNOSIS — K56601 Complete intestinal obstruction, unspecified as to cause: Principal | ICD-10-CM | POA: Diagnosis present

## 2020-11-04 DIAGNOSIS — Z808 Family history of malignant neoplasm of other organs or systems: Secondary | ICD-10-CM

## 2020-11-04 DIAGNOSIS — C563 Malignant neoplasm of bilateral ovaries: Secondary | ICD-10-CM | POA: Diagnosis present

## 2020-11-04 DIAGNOSIS — C786 Secondary malignant neoplasm of retroperitoneum and peritoneum: Secondary | ICD-10-CM | POA: Diagnosis present

## 2020-11-04 LAB — CBC
HCT: 40 % (ref 36.0–46.0)
Hemoglobin: 13 g/dL (ref 12.0–15.0)
MCH: 30.4 pg (ref 26.0–34.0)
MCHC: 32.5 g/dL (ref 30.0–36.0)
MCV: 93.5 fL (ref 80.0–100.0)
Platelets: 329 10*3/uL (ref 150–400)
RBC: 4.28 MIL/uL (ref 3.87–5.11)
RDW: 17.4 % — ABNORMAL HIGH (ref 11.5–15.5)
WBC: 11.3 10*3/uL — ABNORMAL HIGH (ref 4.0–10.5)
nRBC: 0 % (ref 0.0–0.2)

## 2020-11-04 LAB — COMPREHENSIVE METABOLIC PANEL
ALT: 17 U/L (ref 0–44)
AST: 25 U/L (ref 15–41)
Albumin: 3.4 g/dL — ABNORMAL LOW (ref 3.5–5.0)
Alkaline Phosphatase: 82 U/L (ref 38–126)
Anion gap: 12 (ref 5–15)
BUN: 21 mg/dL (ref 8–23)
CO2: 26 mmol/L (ref 22–32)
Calcium: 8.8 mg/dL — ABNORMAL LOW (ref 8.9–10.3)
Chloride: 96 mmol/L — ABNORMAL LOW (ref 98–111)
Creatinine, Ser: 1.03 mg/dL — ABNORMAL HIGH (ref 0.44–1.00)
GFR, Estimated: >60 mL/min (ref >60)
Glucose, Bld: 106 mg/dL — ABNORMAL HIGH (ref 70–99)
Potassium: 3.6 mmol/L (ref 3.5–5.1)
Sodium: 134 mmol/L — ABNORMAL LOW (ref 135–145)
Total Bilirubin: 1.3 mg/dL — ABNORMAL HIGH (ref 0.3–1.2)
Total Protein: 6.7 g/dL (ref 6.5–8.1)

## 2020-11-04 LAB — RESP PANEL BY RT-PCR (FLU A&B, COVID) ARPGX2
Influenza A by PCR: NEGATIVE
Influenza B by PCR: NEGATIVE
SARS Coronavirus 2 by RT PCR: NEGATIVE

## 2020-11-04 LAB — LIPASE, BLOOD: Lipase: 43 U/L (ref 11–51)

## 2020-11-04 MED ORDER — LACTATED RINGERS IV BOLUS
1000.0000 mL | Freq: Once | INTRAVENOUS | Status: AC
  Administered 2020-11-04: 1000 mL via INTRAVENOUS

## 2020-11-04 MED ORDER — ONDANSETRON HCL 4 MG/2ML IJ SOLN
4.0000 mg | Freq: Once | INTRAMUSCULAR | Status: AC
  Administered 2020-11-04: 4 mg via INTRAVENOUS
  Filled 2020-11-04: qty 2

## 2020-11-04 MED ORDER — MORPHINE SULFATE (PF) 4 MG/ML IV SOLN
4.0000 mg | Freq: Once | INTRAVENOUS | Status: AC
  Administered 2020-11-04: 4 mg via INTRAVENOUS
  Filled 2020-11-04: qty 1

## 2020-11-04 MED ORDER — ONDANSETRON HCL 4 MG/2ML IJ SOLN
4.0000 mg | Freq: Four times a day (QID) | INTRAMUSCULAR | Status: DC | PRN
  Administered 2020-11-07: 4 mg via INTRAVENOUS
  Filled 2020-11-04 (×2): qty 2

## 2020-11-04 MED ORDER — ENOXAPARIN SODIUM 40 MG/0.4ML ~~LOC~~ SOLN
40.0000 mg | SUBCUTANEOUS | Status: DC
  Filled 2020-11-04 (×3): qty 0.4

## 2020-11-04 MED ORDER — LIDOCAINE-PRILOCAINE 2.5-2.5 % EX CREA
TOPICAL_CREAM | Freq: Once | CUTANEOUS | Status: DC
  Filled 2020-11-04: qty 5

## 2020-11-04 MED ORDER — IOHEXOL 300 MG/ML  SOLN
75.0000 mL | Freq: Once | INTRAMUSCULAR | Status: AC | PRN
  Administered 2020-11-04: 75 mL via INTRAVENOUS

## 2020-11-04 MED ORDER — POTASSIUM CHLORIDE IN NACL 40-0.9 MEQ/L-% IV SOLN
INTRAVENOUS | Status: DC
  Filled 2020-11-04 (×7): qty 1000

## 2020-11-04 MED ORDER — CHLORHEXIDINE GLUCONATE CLOTH 2 % EX PADS
6.0000 | MEDICATED_PAD | Freq: Every day | CUTANEOUS | Status: DC
  Administered 2020-11-05 – 2020-11-06 (×2): 6 via TOPICAL

## 2020-11-04 MED ORDER — MORPHINE SULFATE (PF) 2 MG/ML IV SOLN
2.0000 mg | INTRAVENOUS | Status: DC | PRN
Start: 2020-11-04 — End: 2020-11-07
  Administered 2020-11-07: 2 mg via INTRAVENOUS
  Filled 2020-11-04: qty 1

## 2020-11-04 MED ORDER — ONDANSETRON HCL 4 MG PO TABS: 4.0000 mg | ORAL_TABLET | Freq: Four times a day (QID) | ORAL | Status: DC | PRN

## 2020-11-04 MED ORDER — PANTOPRAZOLE SODIUM 40 MG IV SOLR
40.0000 mg | INTRAVENOUS | Status: DC
  Administered 2020-11-04 – 2020-11-06 (×3): 40 mg via INTRAVENOUS
  Filled 2020-11-04 (×3): qty 40

## 2020-11-04 MED ORDER — LORAZEPAM 2 MG/ML IJ SOLN
0.5000 mg | Freq: Two times a day (BID) | INTRAMUSCULAR | Status: DC | PRN
  Administered 2020-11-04 – 2020-11-07 (×7): 0.5 mg via INTRAVENOUS
  Filled 2020-11-04 (×7): qty 1

## 2020-11-04 NOTE — Consult Note (Signed)
SURGICAL CONSULTATION NOTE   HISTORY OF PRESENT ILLNESS (HPI):  65 y.o. female presented to [facility] ED for evaluation of abdominal incision nausea or vomiting since 2 days ago. Patient reports she was admitted last week for small bowel obstruction.  She was treated with nasogastric tube decompression.  NGT was removed Wednesday and discharged Thursday.  She was feeling well on Thursday and Friday but Saturday she started having nausea again.  This continued to progress and she developed vomiting.  She reports abdominal pain that is generalized.  No pain radiation.  Unable to identify any aggravating factors.  Alleviating factor he was NGT decompression again.  At the ED she had a CT scan that shows recurrent small bowel dilation with the same findings of the advanced metastatic disease.  Patient has abdomen ascites.  No free air.  I personally evaluated the images.  NGT was placed and she immediately felt significant relief.  She denies passing flatus.  Surgery is consulted by Dr. Francine Graven in this context for evaluation and management of small bowel obstruction.  PAST MEDICAL HISTORY (PMH):  Past Medical History:  Diagnosis Date  . Cancer ([address])   . Family history of breast cancer   . Family history of lung cancer   . HYPERLIPIDEMIA 04/09/2008   Qualifier: Diagnosis of  By: Wynona Luna   . HYPOTHYROIDISM 04/09/2008   Qualifier: Diagnosis of  By: Wynona Luna   . INSOMNIA, CHRONIC 08/21/2010   Qualifier: Diagnosis of  By: Wynona Luna   . PERSONAL HISTORY MALIGNANT NEOPLASM THYROID 08/21/2010   Qualifier: Diagnosis of  By: Wynona Luna      PAST SURGICAL HISTORY [facility]):  Past Surgical History:  Procedure Laterality Date  . IR IMAGING GUIDED PORT INSERTION  07/18/2019  . THYROIDECTOMY, PARTIAL       MEDICATIONS:  Prior to Admission medications   Medication Sig Start Date End Date Taking? Authorizing Provider  ALPRAZolam Duanne Moron) 0.5 MG tablet Take 1 tablet (0.5 mg total) by  mouth 2 (two) times daily as needed for anxiety. 03/14/20  Yes Plotnikov, [name], MD  amitriptyline (ELAVIL) 50 MG tablet Take 1 tablet (50 mg total) by mouth at bedtime. 10/25/20  Yes Vaslow, Acey Lav, MD  Artificial Tear Solution (20/20 ARTIFICIAL TEARS OP) Apply 2 drops to eye daily as needed (dry eyes).   Yes Plotnikov, [name], MD  Ascorbic Acid (VITAMIN C) 1000 MG tablet Take 1,000 mg by mouth in the morning and at bedtime.    Yes [provider]  Cholecalciferol (VITAMIN D3) 50 MCG (2000 UT) TABS Take 2,000 Units by mouth in the morning and at bedtime.   Yes [provider]  dexamethasone (DECADRON) 4 MG tablet Take 1 tablet (4 mg total) by mouth daily. 10/31/20 10/31/21 Yes Nolberto Hanlon, MD  famotidine (PEPCID) 20 MG tablet Take 1 tablet (20 mg total) by mouth daily. 10/31/20 11/30/20 Yes Nolberto Hanlon, MD  lidocaine-prilocaine (EMLA) cream Apply generously to the port area 45-60 mins prior. 10/16/20  Yes Lloyd Huger, MD  MELATONIN PO Take 5 mg by mouth daily as needed (sleep).   Yes Plotnikov, [name], MD  ondansetron (ZOFRAN) 8 MG tablet One pill every 8 hours as needed for nausea/vomitting. 06/23/19  Yes Cammie Sickle, MD  polyethylene glycol (MIRALAX / GLYCOLAX) 17 g packet Take 17 g by mouth daily as needed.    Yes [provider]  SYNTHROID 75 MCG tablet Take 1 tablet (  75 mcg total) by mouth daily before breakfast. 09/09/20  Yes Plotnikov, [name], MD  zolpidem (AMBIEN) 10 MG tablet TAKE 1 TABLET BY MOUTH EVERY DAY AT BEDTIME AS NEEDED FOR SLEEP Patient taking differently: Take 10 mg by mouth at bedtime as needed for sleep. 07/17/20  Yes Lloyd Huger, MD  Omega-3 Fatty Acids (FISH OIL) 1000 MG CAPS Take 2 capsules by mouth daily. Patient not taking: Reported on 11/04/2020    [provider]  Probiotic Product (PROBIOTIC-10) CHEW Chew 1 capsule by mouth. Patient not taking: Reported on 11/04/2020    [provider]   rosuvastatin (CRESTOR) 5 MG tablet Take 1 tablet (5 mg total) by mouth daily. 05/17/19 05/17/19  Plotnikov, [name], MD     ALLERGIES:  No Known Allergies   SOCIAL HISTORY:  Social History   Socioeconomic History  . Marital status: Married    Spouse name: Not on file  . Number of children: Not on file  . Years of education: Not on file  . Highest education level: Not on file  Occupational History  . Not on file  Tobacco Use  . Smoking status: Never Smoker  . Smokeless tobacco: Never Used  Vaping Use  . Vaping Use: Never used  Substance and Sexual Activity  . Alcohol use: Not Currently  . Drug use: No  . Sexual activity: Yes  Other Topics Concern  . 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 Arthritis Mother   . Hyperlipidemia Mother   . Hyperlipidemia Father   . Diabetes Father   . Hypertension Father   . Heart disease Father 84       CAD  . Lung cancer Father   . Breast cancer Maternal Grandmother        in 36s  . Cancer Maternal Aunt        unk type  . Cancer Maternal Uncle        unk type  . Cancer Paternal Uncle        unk type, d. 47s  . Skin cancer Daughter        basal cell      REVIEW OF SYSTEMS:  Constitutional: denies weight loss, fever, chills, or sweats  Eyes: denies any other vision changes, history of eye injury  ENT: denies sore throat, hearing problems  Respiratory: denies shortness of breath, wheezing  Cardiovascular: denies chest pain, palpitations   Gastrointestinal: Positive abdominal pain, nausea and vomiting Genitourinary: denies burning with urination or urinary frequency Musculoskeletal: denies any other joint pains or cramps  Skin: denies any other rashes or skin discolorations  Neurological: denies any other headache, dizziness, weakness  Psychiatric: denies any other depression, anxiety   All other review of systems were negative   VITAL SIGNS:  Temp:  [97.7 F (36.5 C)] 97.7 F (36.5 C) (04/18 0904) Pulse Rate:  [83-96] 96 (04/18 1630) Resp:  [14-20] 19 (04/18 1630) BP: (105-127)/(63-83) 112/65 (04/18 1630) SpO2:  [96 %-  100 %] 99 % (04/18 1630) Weight:  [55 kg] 55 kg (04/18 0852)     Height: 5\' 6"  (167.6 cm) Weight: 55 kg BMI (Calculated): 19.57   INTAKE/OUTPUT:  This shift: No intake/output data recorded.  Last 2 shifts: @IOLAST2SHIFTS @   PHYSICAL EXAM:  Constitutional:  -- Normal body habitus  -- Awake, alert, and oriented x3  Eyes:  -- Pupils equally round and reactive to light  -- No scleral icterus  Ear, nose, and throat:  -- No jugular venous distension  Pulmonary:  -- No crackles  -- Equal breath sounds bilaterally -- Breathing non-labored at rest Cardiovascular:  -- S1, S2 present  -- No pericardial rubs Gastrointestinal:  -- Abdomen soft, tender, distended, no guarding or rebound tenderness -- No abdominal masses appreciated, pulsatile or otherwise  Musculoskeletal and Integumentary:  -- Wounds: None appreciated -- Extremities: B/L UE and LE FROM, hands and feet warm, no edema  Neurologic:  -- Motor function: intact and symmetric -- Sensation: intact and symmetric   Labs:  CBC Latest Ref Rng & Units 11/04/2020 10/28/2020 10/27/2020  WBC 4.0 - 10.5 K/uL 11.3(H) 6.4 7.0  Hemoglobin 12.0 - 15.0 g/dL 13.0 11.2(L) 12.7  Hematocrit 36.0 - 46.0 % 40.0 34.4(L) 38.7  Platelets 150 - 400 K/uL 329 228 260   CMP Latest Ref Rng & Units 11/04/2020 10/28/2020 10/27/2020  Glucose 70 - 99 mg/dL 106(H)  116(H) 123(H)  BUN 8 - 23 mg/dL 21 33(H) 29(H)  Creatinine 0.44 - 1.00 mg/dL 1.03(H) 0.87 1.14(H)  Sodium 135 - 145 mmol/L 134(L) 138 136  Potassium 3.5 - 5.1 mmol/L 3.6 4.0 4.1  Chloride 98 - 111 mmol/L 96(L) 102 96(L)  CO2 22 - 32 mmol/L 26 26 27   Calcium 8.9 - 10.3 mg/dL 8.8(L) 8.4(L) 9.1  Total Protein 6.5 - 8.1 g/dL 6.7 - 7.5  Total Bilirubin 0.3 - 1.2 mg/dL 1.3(H) - 1.6(H)  Alkaline Phos 38 - 126 U/L 82 - 85  AST 15 - 41 U/L 25 - 20  ALT 0 - 44 U/L 17 - 15    Imaging studies:  EXAM: CT ABDOMEN AND PELVIS WITH CONTRAST  TECHNIQUE: Multidetector CT imaging of the abdomen and pelvis was performed using the standard protocol following bolus administration of intravenous contrast.  CONTRAST:  13mL OMNIPAQUE IOHEXOL 300 MG/ML  SOLN  COMPARISON:  CT the abdomen and pelvis 10/27/2020.  FINDINGS: Lower chest: Low-attenuation around the distal esophagus, presumably ascites which has extends cephalad above the esophageal hiatus.  Hepatobiliary: 2 hypovascular heterogeneously enhancing lesions are again noted in the liver, largest of which is in the left lobe of the liver predominantly in segment 4B (axial image 18 of series 2) measuring 4.5 x 3.6 x 3.9 cm (axial image 18 of series 2 and coronal image 29 of series 5). In segment 6 of the liver (axial image 18 of series 2) there is a smaller lesion measuring 2.6 x 2.5 x 2.2 cm. No other new hepatic lesions. No intra or extrahepatic biliary ductal dilatation. Diffuse periportal edema. Amorphous material lying dependently in the gallbladder, likely to reflect biliary sludge.  Pancreas: No pancreatic mass. No pancreatic ductal dilatation. No pancreatic or peripancreatic fluid collections or inflammatory changes.  Spleen: Unremarkable.  Adrenals/Urinary Tract: Subcentimeter low-attenuation lesion in the upper pole of the right kidney, too small to characterize, but statistically likely to represent a cyst. Left kidney  and bilateral adrenal glands are normal in appearance. No hydroureteronephrosis. Urinary bladder is nearly decompressed, but otherwise unremarkable in  appearance.  Stomach/Bowel: The appearance of the stomach is normal. Dilatation of the small proximal to mid small bowel, which measures up to 5.1 cm in diameter, with multiple air-fluid levels. Distal small bowel is completely decompressed. Small volume of stool throughout the colon. Exact transition point in the small bowel is not confidently identified on today's examination. Appendix is not visualized, likely surgically absent.  Vascular/Lymphatic: Aortic atherosclerosis, without evidence of aneurysm or dissection in the abdominal or pelvic vasculature. Multiple prominent but nonenlarged retroperitoneal lymph nodes, many of which are partially calcified. No definite lymphadenopathy otherwise noted in the abdomen or pelvis.  Reproductive: Status post total abdominal hysterectomy and bilateral salpingo-oophorectomy.  Other: Large volume of ascites. Small centrally low-attenuation peripherally intermediate to high attenuation lesion in the left perirectal region (axial image 74 of series 2) measuring 2.1 x 1.7 cm, stable compared to prior examinations. No pneumoperitoneum.  Musculoskeletal: There are no aggressive appearing lytic or blastic lesions noted in the visualized portions of the skeleton.  IMPRESSION: 1. Today's examination is very similar to the recent prior study, again demonstrating dilatation of the small bowel indicative of small-bowel obstruction. Transition point is not confidently identified, however, the ileum appears decompressed indicating that the point of obstruction is presumably in the distal jejunum or proximal ileum. 2. Persistent large volume of presumably malignant ascites. Stable small left perirectal lesion which likely represents a peritoneal implant. Hypovascular heterogeneously enhancing  metastatic liver lesions appear stable to slightly increased in size compared to the recent prior examination. 3. Diffuse periportal edema in the liver. 4. Aortic atherosclerosis. 5. Additional incidental findings, as above.   Electronically Signed   By: Vinnie Langton M.D.   On: 11/04/2020 10:48  Assessment/Plan:  65 y.o. female with recurrent small bowel obstruction, complicated by pertinent comorbidities including advanced metastatic carcinoma of the ovaries, anxiety, depression, dyslipidemia, neuropathy.  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  CT findings are concerned that this obstruction may be malignant obstruction.  Differential also obstruction due to adhesions.  There is a very difficult situation because if there is due to the advanced metastatic intra-abdominal disease and peritoneal implants most of the time when this is found during surgery there is not much to do.  Initially I agree with current management with nasogastric tube decompression, IV fluid hydration, pain management.  I agree to consider octreotide which sometimes helps with symptoms of malignant bowel obstruction.  Patient would like daughter to be present during the surgical follow-up tomorrow for further discussion.   Arnold Long, MD

## 2020-11-04 NOTE — ED Notes (Addendum)
This RN noted pt NG tube to be at 55 at the nare. Adjusted NG tube back to 60 at the nare. NG tube noted to still be draining normally at this time. Resecured NG tube at this time.   MD Agbatta made aware at this time. No new orders given.

## 2020-11-04 NOTE — H&P (Signed)
History and Physical    Amanda Pena YDX:[id number] DOB: [birth year] DOA: 11/04/2020  PCP: Cassandria Anger, MD   Patient coming from: Home  I have personally briefly reviewed patient's old medical records in [place]  Chief Complaint: Abdominal pain                               Nausea/vomiting  HPI: Amanda Pena is a 65 y.o. 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 Patient was discharged from the hospital 4 days ago after being managed conservatively for small bowel obstruction. She reports that she did well 2 days after her discharge and on the third night she developed severe abdominal pain mostly in the periumbilical area which she rated an 8 x 10 in intensity at its worst associated with nausea and multiple episodes of emesis.  She also notes that her abdomen is distended.  She is able to burp but has not passed flatus and her last bowel movement was 3 days prior to this admission. Patient states that she has been on a liquid diet since her discharge but has not been able to tolerate that over the last 2 days. Her last chemo treatment was on 10/23/20, patient states that her oncologist discontinued her chemotherapy due to poor response.  She has an appointment to follow-up with oncology to discuss if she has any further treatment options. She denies having any chest pain, no shortness of breath, no headache, no fever, no chills, no urinary symptoms, no headache, no dizziness, no lightheadedness, no palpitations, no diaphoresis, no focal deficits. Labs show  sodium 134, potassium 3.6, chloride 96, bicarb 26, glucose 106, BUN 21, creatinine 1.03, calcium 8.8, alkaline phosphatase 82, albumin 3.4, lipase 23, AST 25, ALT 17, total protein 6.7, total bilirubin 1.3, white count 11.3, hemoglobin 13.0, hematocrit 40.0, MCV 93.5, RDW 17.4, platelet count 329 Respiratory viral panel is negative CT scan of abdomen shows dilatation of the small bowel indicative of small-bowel obstruction. Transition point is not confidently identified, however, the ileum appears decompressed indicating that the point of obstruction is presumably in the distal jejunum or proximal ileum. Persistent large volume of presumably malignant ascites. Stable small left perirectal lesion which likely represents a peritoneal implant. Hypovascular heterogeneously enhancing metastatic liver lesions appear stable to slightly increased in size compared to the recent prior examination. Diffuse periportal edema in the liver. Aortic atherosclerosis. Abdominal x-ray shows enteric tube terminates in the proximal body of the stomach. Mildly dilated central abdominal small bowel loop, compatible with the partial small bowel obstruction described on CT study from earlier today. Twelve-lead EKG reviewed by me shows sinus rhythm, LVH, left anterior fascicular block with nonspecific T wave changes in the lateral leads.  ED Course: Patient is a 65 year old female with metastatic ovarian cancer who presents to the ER for evaluation of abdominal pain, nausea and vomiting.  Imaging confirms a small bowel obstruction.  Patient was recently discharged from the hospital 4 days prior to this admission for same.  She will be admitted to the hospital for further evaluation.  Review of Systems: As per  HPI otherwise all other systems reviewed and negative.    Past Medical History:  Diagnosis Date  . Cancer ([place])   . Family history of breast cancer   . Family history of lung cancer   . HYPERLIPIDEMIA 04/09/2008    Qualifier: Diagnosis of  By: Wynona Luna   . HYPOTHYROIDISM 04/09/2008   Qualifier: Diagnosis of  By: Wynona Luna   . INSOMNIA, CHRONIC 08/21/2010   Qualifier: Diagnosis of  By: Wynona Luna   . 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 IR IMAGING GUIDED PORT INSERTION  07/18/2019  . THYROIDECTOMY, PARTIAL       reports that she has never smoked. She has never used smokeless tobacco. She reports previous alcohol use. She reports that she does not use drugs.  No Known Allergies  Family History  Problem Relation Age of Onset  . Arthritis Mother   . Hyperlipidemia Mother   . Hyperlipidemia Father   . Diabetes Father   . Hypertension Father   . Heart disease Father 71       CAD  . Lung cancer Father   . Breast cancer Maternal Grandmother        in 27s  . Cancer Maternal Aunt        unk type  . Cancer Maternal Uncle        unk type  . Cancer Paternal Uncle        unk type, d. 67s  . Skin cancer Daughter        basal cell       Prior to Admission medications   Medication Sig Start Date End Date Taking? Authorizing Provider  ALPRAZolam Duanne Moron) 0.5 MG tablet Take 1 tablet (0.5 mg total) by mouth 2 (two) times daily as needed for anxiety. 03/14/20   Plotnikov, [name], MD  amitriptyline (ELAVIL) 50 MG tablet Take 1 tablet (50 mg total) by mouth at bedtime. 10/25/20   Ventura Sellers, MD  Artificial Tear Solution (20/20 ARTIFICIAL TEARS OP) Apply 2 drops to eye daily as needed.     Plotnikov, [name], MD  Ascorbic Acid (VITAMIN C) 1000 MG tablet Take 1,000 mg by mouth in the morning and at bedtime.     [provider]  Cholecalciferol (VITAMIN D3) 50 MCG (2000 UT) TABS Take 1 tablet by mouth in the morning and at bedtime.    [provider]  dexamethasone (DECADRON) 4 MG tablet Take 1 tablet (4 mg total) by mouth daily. 10/31/20  10/31/21  Nolberto Hanlon, MD  famotidine (PEPCID) 20 MG tablet Take 1 tablet (20 mg total) by mouth daily. 10/31/20 11/30/20  Nolberto Hanlon, MD  gabapentin (NEURONTIN) 300 MG capsule Take 1 capsule (300 mg total) by mouth at bedtime. 10/07/20   Lloyd Huger, MD  lidocaine-prilocaine (EMLA) cream Apply generously to the port area 45-60 mins prior. 10/16/20   Lloyd Huger, MD  MELATONIN PO Take 5 mg by mouth daily as needed.     Plotnikov, [name], MD  Omega-3 Fatty Acids (FISH OIL) 1000 MG CAPS Take 2 capsules by mouth daily.    [provider]  ondansetron (ZOFRAN) 8 MG tablet One pill every 8 hours as needed for nausea/vomitting. 06/23/19   Cammie Sickle, MD  polyethylene glycol (MIRALAX / GLYCOLAX) 17 g packet Take 17  g by mouth daily as needed.     [provider]  Probiotic Product (PROBIOTIC-10) CHEW Chew 1 capsule by mouth.    [provider]  prochlorperazine (COMPAZINE) 10 MG tablet Take 1 tablet (10 mg total) by mouth every 6 (six) hours as needed for nausea or vomiting. Patient not taking: Reported on 10/27/2020 06/23/19   Cammie Sickle, MD  SYNTHROID 75 MCG tablet Take 1 tablet (75 mcg total) by mouth daily before breakfast. 09/09/20   Plotnikov, [name], MD  zolpidem (AMBIEN) 10 MG tablet TAKE 1 TABLET BY MOUTH EVERY DAY AT BEDTIME AS NEEDED FOR SLEEP 07/17/20   Lloyd Huger, MD  rosuvastatin (CRESTOR) 5 MG tablet Take 1 tablet (5 mg total) by mouth daily. 05/17/19 05/17/19  Plotnikov, [name], MD    Physical Exam: Vitals:   11/04/20 1000 11/04/20 1100 11/04/20 1200 11/04/20 1233  BP: 107/68 105/63 125/81 124/83  Pulse: 94 89 94 91  Resp: 17 15 16 14   Temp:      TempSrc:      SpO2: 96% 97% 100% 98%  Weight:      Height:         Vitals:   11/04/20 1000 11/04/20 1100 11/04/20 1200 11/04/20 1233  BP: 107/68 105/63 125/81 124/83  Pulse: 94 89 94 91  Resp: 17 15 16 14   Temp:      TempSrc:      SpO2: 96% 97% 100% 98%   Weight:      Height:          Constitutional: Alert and oriented x 3 . Not in any apparent distress HEENT:      Head: Normocephalic and atraumatic.         Eyes: PERLA, EOMI, Conjunctivae are normal. Sclera is non-icteric.       Mouth/Throat: Mucous membranes are dry, NG tube connected to low wall suction      Neck: Supple with no signs of meningismus. Cardiovascular: Regular rate and rhythm. No murmurs, gallops, or rubs. 2+ symmetrical distal pulses are present . No JVD. No LE edema Respiratory: Respiratory effort normal .Lungs sounds clear bilaterally. No wheezes, crackles, or rhonchi.  Gastrointestinal:  Firm, tender in the periumbilical area, and  distended with hypoactive positive bowel sounds.  Genitourinary: No CVA tenderness. Musculoskeletal: Nontender with normal range of motion in all extremities. No cyanosis, or erythema of extremities. Neurologic:  Face is symmetric. Moving all extremities. No gross focal neurologic deficits  Skin: Skin is warm, dry.  No rash or ulcers Psychiatric: Mood and affect are normal    Labs on Admission: I have personally reviewed following labs and imaging studies  CBC: Recent Labs  Lab 11/04/20 0855  WBC 11.3*  HGB 13.0  HCT 40.0  MCV 93.5  PLT 712   Basic Metabolic Panel: Recent Labs  Lab 11/04/20 0855  NA 134*  K 3.6  CL 96*  CO2 26  GLUCOSE 106*  BUN 21  CREATININE 1.03*  CALCIUM 8.8*   GFR: Estimated Creatinine Clearance: 49.2 mL/min (A) (by C-G formula based on SCr of 1.03 mg/dL (H)). Liver Function Tests: Recent Labs  Lab 11/04/20 0855  AST 25  ALT 17  ALKPHOS 82  BILITOT 1.3*  PROT 6.7  ALBUMIN 3.4*   Recent Labs  Lab 11/04/20 0855  LIPASE 43   No results for input(s): AMMONIA in the last 168 hours. Coagulation Profile: No results for input(s): INR, PROTIME in the last 168 hours. Cardiac Enzymes: No  results for input(s): CKTOTAL, CKMB, CKMBINDEX, TROPONINI in the last 168 hours. BNP (last 3  results) No results for input(s): PROBNP in the last 8760 hours. HbA1C: No results for input(s): HGBA1C in the last 72 hours. CBG: No results for input(s): GLUCAP in the last 168 hours. Lipid Profile: No results for input(s): CHOL, HDL, LDLCALC, TRIG, CHOLHDL, LDLDIRECT in the last 72 hours. Thyroid Function Tests: No results for input(s): TSH, T4TOTAL, FREET4, T3FREE, THYROIDAB in the last 72 hours. Anemia Panel: No results for input(s): VITAMINB12, FOLATE, FERRITIN, TIBC, IRON, RETICCTPCT in the last 72 hours. Urine analysis:    Component Value Date/Time   COLORURINE YELLOW (A) 10/27/2020 1255   APPEARANCEUR HAZY (A) 10/27/2020 1255   LABSPEC >1.046 (H) 10/27/2020 1255   PHURINE 5.0 10/27/2020 1255   GLUCOSEU NEGATIVE 10/27/2020 1255   GLUCOSEU NEGATIVE 06/13/2019 1517   HGBUR NEGATIVE 10/27/2020 1255   BILIRUBINUR NEGATIVE 10/27/2020 1255   KETONESUR 80 (A) 10/27/2020 1255   PROTEINUR 30 (A) 10/27/2020 1255   UROBILINOGEN 1.0 06/13/2019 1517   NITRITE NEGATIVE 10/27/2020 1255   LEUKOCYTESUR NEGATIVE 10/27/2020 1255    Radiological Exams on Admission: CT Abdomen Pelvis W Contrast  Result Date: 11/04/2020 CLINICAL DATA:  65 year old female with suspected bowel obstruction. Emesis. History of ovarian cancer. EXAM: CT ABDOMEN AND PELVIS WITH CONTRAST TECHNIQUE: Multidetector CT imaging of the abdomen and pelvis was performed using the standard protocol following bolus administration of intravenous contrast. CONTRAST:  77mL OMNIPAQUE IOHEXOL 300 MG/ML  SOLN COMPARISON:  CT the abdomen and pelvis 10/27/2020. FINDINGS: Lower chest: Low-attenuation around the distal esophagus, presumably ascites which has extends cephalad above the esophageal hiatus. Hepatobiliary: 2 hypovascular heterogeneously enhancing lesions are again noted in the liver, largest of which is in the left lobe of the liver predominantly in segment 4B (axial image 18 of series 2) measuring 4.5 x 3.6 x 3.9 cm (axial image  18 of series 2 and coronal image 29 of series 5). In segment 6 of the liver (axial image 18 of series 2) there is a smaller lesion measuring 2.6 x 2.5 x 2.2 cm. No other new hepatic lesions. No intra or extrahepatic biliary ductal dilatation. Diffuse periportal edema. Amorphous material lying dependently in the gallbladder, likely to reflect biliary sludge. Pancreas: No pancreatic mass. No pancreatic ductal dilatation. No pancreatic or peripancreatic fluid collections or inflammatory changes. Spleen: Unremarkable. Adrenals/Urinary Tract: Subcentimeter low-attenuation lesion in the upper pole of the right kidney, too small to characterize, but statistically likely to represent a cyst. Left kidney and bilateral adrenal glands are normal in appearance. No hydroureteronephrosis. Urinary bladder is nearly decompressed, but otherwise unremarkable in appearance. Stomach/Bowel: The appearance of the stomach is normal. Dilatation of the small proximal to mid small bowel, which measures up to 5.1 cm in diameter, with multiple air-fluid levels. Distal small bowel is completely decompressed. Small volume of stool throughout the colon. Exact transition point in the small bowel is not confidently identified on today's examination. Appendix is not visualized, likely surgically absent. Vascular/Lymphatic: Aortic atherosclerosis, without evidence of aneurysm or dissection in the abdominal or pelvic vasculature. Multiple prominent but nonenlarged retroperitoneal lymph nodes, many of which are partially calcified. No definite lymphadenopathy otherwise noted in the abdomen or pelvis. Reproductive: Status post total abdominal hysterectomy and bilateral salpingo-oophorectomy. Other: Large volume of ascites. Small centrally low-attenuation peripherally intermediate to high attenuation lesion in the left perirectal region (axial image 74 of series 2) measuring 2.1 x 1.7 cm, stable compared to prior examinations. No  pneumoperitoneum.  Musculoskeletal: There are no aggressive appearing lytic or blastic lesions noted in the visualized portions of the skeleton. IMPRESSION: 1. Today's examination is very similar to the recent prior study, again demonstrating dilatation of the small bowel indicative of small-bowel obstruction. Transition point is not confidently identified, however, the ileum appears decompressed indicating that the point of obstruction is presumably in the distal jejunum or proximal ileum. 2. Persistent large volume of presumably malignant ascites. Stable small left perirectal lesion which likely represents a peritoneal implant. Hypovascular heterogeneously enhancing metastatic liver lesions appear stable to slightly increased in size compared to the recent prior examination. 3. Diffuse periportal edema in the liver. 4. Aortic atherosclerosis. 5. Additional incidental findings, as above. Electronically Signed   By: Vinnie Langton M.D.   On: 11/04/2020 10:48   DG Abd Portable 1 View  Result Date: 11/04/2020 CLINICAL DATA:  NG-tube placement, vomiting EXAM: PORTABLE ABDOMEN - 1 VIEW COMPARISON:  10/30/2020 abdominal radiograph and 11/04/2020 CT abdomen/pelvis FINDINGS: Enteric tube terminates in the proximal body of the stomach. Mildly dilated central abdominal small bowel loop. Relatively gasless abdomen. No evidence of pneumatosis or pneumoperitoneum. Excreted contrast seen in the bilateral renal collecting systems. IMPRESSION: 1. Enteric tube terminates in the proximal body of the stomach. 2. Mildly dilated central abdominal small bowel loop, compatible with the partial small bowel obstruction described on CT study from earlier today. Electronically Signed   By: Ilona Sorrel M.D.   On: 11/04/2020 12:13     Assessment/Plan Principal Problem:   SBO (small bowel obstruction) (HCC) Active Problems:   Hypothyroidism   Malignant neoplasm of ovary (HCC)   Peritoneal carcinomatosis (HCC)   Peripheral neuropathy due to  chemotherapy (HCC)     Small bowel obstruction Probably secondary to adhesions Keep patient n.p.o. Nasogastric decompression Morphine 2 mg IV every 4 as needed severe pain, antiemetics and IV PPI Will request surgical consult.    Dehydration  Secondary to GI loss from nausea and vomiting as well as poor oral intake  Continue IV fluid hydration  Repeat renal parameters in a.m.    Anxiety Place patient on Ativan 0.5 mg IV twice daily as needed for anxiety   Hypothyroid Hold levothyroxine since patient has NG tube connected to low wall suction.      Peripheral neuropathy secondary to chemotherapy- Hold gabapentin 300 mg for now   DVT prophylaxis: Lovenox  Code Status: DO NOT RESUSCITATE Family Communication: Greater than 50% of time was spent discussing plan of care with patient at the bedside.  All questions and concerns have been addressed.  CODE STATUS was discussed and she is a DNR. She lists her husband and daughter as her healthcare power of attorney. Disposition Plan: Back to previous home environment Consults called: Surgery/oncology Status: At the time of admission, it appears that the appropriate admission status for this patient is inpatient. This is judged to be reasonable and necessary in order to provide the required intensity of service to ensure the patient's safety given the presenting symptoms, physical exam findings, and initial radiographic and laboratory data in the context of their comorbid conditions. Patient requires inpatient status due to high intensity of service, high risk of further deterioration and high frequency of surveillance required.     Collier Bullock MD [facility]     11/04/2020, 2:26 PM

## 2020-11-04 NOTE — Consult Note (Signed)
[place]  Telephone:([phone] [phone]   Name: Amanda Pena Date: 11/04/2020 MRN: [id number]  DOB: 09/06/1955  Patient Care Team: Cassandria Anger, MD as PCP - General (Internal Medicine) Arvella Nigh, MD as Consulting Physician (Obstetrics and Gynecology) Clent Jacks, RN as Oncology Nurse Navigator    REASON FOR CONSULTATION: Amanda Pena is a 65 y.o. 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  PMH also notable for CIPN, history of thyroid cancer status post thyroidectomy, anxiety, depression, insomnia, and hyperlipidemia.  Patient is followed locally by [facility]. She has most recently been treated with third line Taxol/Avastin.    Patient was hospitalized 10/27/20 to 10/31/2020 with intractable nausea, vomiting and abdominal pain.  CT of the abdomen and pelvis was consistent with SBO and progressive liver metastasis and worsening peritoneal disease.  Patient's symptoms improved with conservative management including use of gastric decompression via NGT and octreotide/dexamethasone.  Patient was able to tolerate a liquid diet at time of discharge but then nausea and vomiting recurred 2 days after discharge.  Patient is now readmitted 11/04/2020 with recurrent SBO, presumably of malignant etiology.  Patient was referred to palliative care to help address goals and manage ongoing symptoms..   SOCIAL HISTORY:     reports that she has never smoked. She has never used smokeless tobacco. She reports previous alcohol use. She reports that she does not use drugs.  Patient is married and lives at home with her husband.  She has a daughter.  Patient is active and still plays tennis  socially.  ADVANCE DIRECTIVES:  None on file  CODE STATUS: DNR  PAST MEDICAL HISTORY: Past Medical History:  Diagnosis Date  . Cancer ([place])   . Family history of breast cancer   . Family history of lung cancer   . HYPERLIPIDEMIA 04/09/2008   Qualifier: Diagnosis of  By: Wynona Luna   . HYPOTHYROIDISM 04/09/2008   Qualifier: Diagnosis of  By: Wynona Luna   . INSOMNIA, CHRONIC 08/21/2010   Qualifier: Diagnosis of  By: Wynona Luna   . PERSONAL HISTORY MALIGNANT NEOPLASM THYROID 08/21/2010   Qualifier: Diagnosis of  By: Wynona Luna     PAST SURGICAL HISTORY:  Past Surgical History:  Procedure Laterality Date  . IR IMAGING GUIDED PORT INSERTION  07/18/2019  . THYROIDECTOMY, PARTIAL      HEMATOLOGY/ONCOLOGY HISTORY:  Oncology History Overview Note  #November 2020-omental caking/peritoneal carcinomatosis; bilateral adnexal masses 4-5 cm in size; multiple lung nodules;   # dec 8th 2020-omental biopsy; positive for high-grade serous adenocarcinoma; GYN origin.  CT scan chest-bilateral lung nodules; 3.8 cm soft tissue mass adjacent to GE junction  # DEC 11TH 2020-CARBO-Taxol [chemo consent] x 3 cycles; February 2021-improvement of the peritoneal carcinomatosis; lung lesions.. 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  Partial colectomy  # #Genetic counseling-s/p NEG myRiskMyriad.   # Thyroid cancer at 71y s/p thyroidectomy [no RAIU]  # NGS/MOLECULAR TESTS: My risk myriad negative; HRD negative; NGS-P  # PALLIATIVE CARE EVALUATION:P  # PAIN MANAGEMENT: P   DIAGNOSIS: Bilateral ovarian cancer/tube/peritoneal  STAGE: IV   ;  GOALS: Control  CURRENT/MOST RECENT THERAPY : Carbotaxol [C]    Peritoneal  carcinoma ([place])  06/23/2019 Initial Diagnosis   Peritoneal carcinoma ([place])   06/30/2019 - 11/17/2019 Chemotherapy   The patient had dexamethasone (DECADRON) 4 MG  tablet, 8 mg, Oral, Daily, 1 of 1 cycle, Start date: --, End date: -- palonosetron (ALOXI) injection 0.25 mg, 0.25 mg, Intravenous,  Once, 6 of 6 cycles Administration: 0.25 mg (06/30/2019), 0.25 mg (07/24/2019), 0.25 mg (08/14/2019), 0.25 mg (10/06/2019), 0.25 mg (10/27/2019), 0.25 mg (11/17/2019) pegfilgrastim (NEULASTA ONPRO KIT) injection 6 mg, 6 mg, Subcutaneous, Once, 1 of 1 cycle Administration: 6 mg (11/17/2019) CARBOplatin (PARAPLATIN) 570 mg in sodium chloride 0.9 % 250 mL chemo infusion, 570 mg (100 % of original dose 567 mg), Intravenous,  Once, 6 of 6 cycles Dose modification:   (original dose 567 mg, Cycle 1) Administration: 570 mg (06/30/2019), 570 mg (07/24/2019), 570 mg (08/14/2019), 520 mg (10/27/2019), 520 mg (11/17/2019) fosaprepitant (EMEND) 150 mg in sodium chloride 0.9 % 145 mL IVPB, 150 mg, Intravenous,  Once, 1 of 1 cycle PACLitaxel (TAXOL) 288 mg in sodium chloride 0.9 % 250 mL chemo infusion (> 66m/m2), 175 mg/m2 = 288 mg, Intravenous,  Once, 6 of 6 cycles Administration: 288 mg (06/30/2019), 288 mg (07/24/2019), 288 mg (08/14/2019), 288 mg (10/06/2019), 288 mg (10/27/2019), 288 mg (11/17/2019) fosaprepitant (EMEND) 150 mg, dexamethasone (DECADRON) 12 mg in sodium chloride 0.9 % 145 mL IVPB, , Intravenous,  Once, 5 of 5 cycles Administration:  (07/24/2019),  (08/14/2019),  (10/06/2019),  (10/27/2019),  (11/17/2019)  for chemotherapy treatment.     Genetic Testing   Negative genetic testing. No pathogenic variants identified on the Myriad MyRisk+HRD. The report date is 10/02/2019.  The [facility] panel offered by [employer]includes sequencing and deletion/duplication testing of the following 35 genes: APC, ATM, AXIN2, BARD1, BMPR1A, BRCA1, BRCA2, BRIP1, CHD1, CDK4, CDKN2A, CHEK2, EPCAM (large rearrangement only), HOXB13, GALNT12, MLH1, MSH2, MSH3, MSH6, MUTYH, NBN, NTHL1, PALB2, PMS2, PTEN, RAD51C, RAD51D, RNF43, RPS20, SMAD4, STK11, and TP53. Sequencing was performed for select  regions of POLE and POLD1, and large rearrangement analysis was performed for select regions of GREM1.    12/19/2019 - 12/19/2019 Chemotherapy   The patient had DOXOrubicin HCL LIPOSOMAL (DOXIL) 64 mg in dextrose 5 % 250 mL chemo infusion, 40 mg/m2, Intravenous,  Once, 0 of 6 cycles bevacizumab-bvzr (ZIRABEV) 500 mg in sodium chloride 0.9 % 100 mL chemo infusion, 10 mg/kg, Intravenous,  Once, 0 of 6 cycles  for chemotherapy treatment.    07/17/2020 -  Chemotherapy    Patient is on Treatment Plan: OVARIAN PACLITAXEL  DQ2,5,95,63+ BEVACIZUMAB D1,15 Q28D      [place]  06/23/2019 Initial Diagnosis   [place] of [place] ([place]   12/19/2019 - 12/19/2019 Chemotherapy   The patient had DOXOrubicin HCL LIPOSOMAL (DOXIL) 64 mg in dextrose 5 % 250 mL chemo infusion, 40 mg/m2, Intravenous,  Once, 0 of 6 cycles bevacizumab-bvzr (ZIRABEV) 500 mg in sodium chloride 0.9 % 100 mL chemo infusion, 10 mg/kg, Intravenous,  Once, 0 of 6 cycles  for chemotherapy treatment.    07/17/2020 -  Chemotherapy    Patient is on Treatment Plan: OVARIAN PACLITAXEL  DO7,5,64,33+ BEVACIZUMAB D1,15 Q28D      07/17/2020 Cancer Staging   Staging form: Ovary, Fallopian Tube, and Primary Peritoneal Carcinoma, AJCC 8th Edition - Clinical: FIGO Stage IVA (pM1a) - Signed by FLloyd Huger MD on 07/17/2020   [place]omatosis ([place]  12/19/2019 - 12/19/2019 Chemotherapy   The patient had DOXOrubicin HCL LIPOSOMAL (  DOXIL) 64 mg in dextrose 5 % 250 mL chemo infusion, 40 mg/m2, Intravenous,  Once, 0 of 6 cycles bevacizumab-bvzr (ZIRABEV) 500 mg in sodium chloride 0.9 % 100 mL chemo infusion, 10 mg/kg, Intravenous,  Once, 0 of 6 cycles  for chemotherapy treatment.    12/19/2019 Initial Diagnosis   Peritoneal carcinomatosis ([place])   07/17/2020 -  Chemotherapy   The patient had PACLitaxel (TAXOL) 126 mg in sodium chloride 0.9 % 250 mL chemo infusion (</= 61m/m2), 80 mg/m2 = 126 mg, Intravenous,   Once, 0 of 4 cycles bevacizumab-bvzr (ZIRABEV) 500 mg in sodium chloride 0.9 % 100 mL chemo infusion, 10 mg/kg = 500 mg, Intravenous,  Once, 0 of 4 cycles  for chemotherapy treatment.    07/17/2020 -  Chemotherapy    Patient is on Treatment Plan: OVARIAN PACLITAXEL  DR6,7,89,38+ BEVACIZUMAB D1,15 Q28D        ALLERGIES:  has No Known Allergies.  MEDICATIONS:  Current Facility-Administered Medications  Medication Dose Route Frequency Provider Last Rate Last Admin  . 0.9 % NaCl with KCl 40 mEq / L  infusion   Intravenous Continuous Agbata, Tochukwu, MD 100 mL/hr at 11/04/20 1458 New Bag at 11/04/20 1458  . enoxaparin (LOVENOX) injection 40 mg  40 mg Subcutaneous Q24H Agbata, Tochukwu, MD      . lidocaine-prilocaine (EMLA) cream   Topical Once Agbata, Tochukwu, MD      . LORazepam (ATIVAN) injection 0.5 mg  0.5 mg Intravenous BID PRN Agbata, Tochukwu, MD   0.5 mg at 11/04/20 1449  . morphine 2 MG/ML injection 2 mg  2 mg Intravenous Q4H PRN Agbata, Tochukwu, MD      . ondansetron (ZOFRAN) tablet 4 mg  4 mg Oral Q6H PRN Agbata, Tochukwu, MD       Or  . ondansetron (ZOFRAN) injection 4 mg  4 mg Intravenous Q6H PRN Agbata, Tochukwu, MD      . pantoprazole (PROTONIX) injection 40 mg  40 mg Intravenous Q24H Agbata, Tochukwu, MD   40 mg at 11/04/20 1444   Current Outpatient Medications  Medication Sig Dispense Refill  . ALPRAZolam (XANAX) 0.5 MG tablet Take 1 tablet (0.5 mg total) by mouth 2 (two) times daily as needed for anxiety. 60 tablet 2  . amitriptyline (ELAVIL) 50 MG tablet Take 1 tablet (50 mg total) by mouth at bedtime. 30 tablet 3  . Artificial Tear Solution (20/20 ARTIFICIAL TEARS OP) Apply 2 drops to eye daily as needed (dry eyes).    . Ascorbic Acid (VITAMIN C) 1000 MG tablet Take 1,000 mg by mouth in the morning and at bedtime.     . Cholecalciferol (VITAMIN D3) 50 MCG (2000 UT) TABS Take 2,000 Units by mouth in the morning and at bedtime.    .Marland Kitchendexamethasone (DECADRON) 4 MG  tablet Take 1 tablet (4 mg total) by mouth daily. 30 tablet 11  . famotidine (PEPCID) 20 MG tablet Take 1 tablet (20 mg total) by mouth daily. 30 tablet 0  . lidocaine-prilocaine (EMLA) cream Apply generously to the port area 45-60 mins prior. 30 g 0  . MELATONIN PO Take 5 mg by mouth daily as needed (sleep).    . ondansetron (ZOFRAN) 8 MG tablet One pill every 8 hours as needed for nausea/vomitting. 40 tablet 1  . polyethylene glycol (MIRALAX / GLYCOLAX) 17 g packet Take 17 g by mouth daily as needed.     .Marland KitchenSYNTHROID 75 MCG tablet Take 1 tablet (75 mcg total) by mouth daily  before breakfast. 30 tablet 0  . zolpidem (AMBIEN) 10 MG tablet TAKE 1 TABLET BY MOUTH EVERY DAY AT BEDTIME AS NEEDED FOR SLEEP (Patient taking differently: Take 10 mg by mouth at bedtime as needed for sleep.) 30 tablet 1  . Omega-3 Fatty Acids (FISH OIL) 1000 MG CAPS Take 2 capsules by mouth daily. (Patient not taking: Reported on 11/04/2020)    . Probiotic Product (PROBIOTIC-10) CHEW Chew 1 capsule by mouth. (Patient not taking: Reported on 11/04/2020)      VITAL SIGNS: BP 127/82   Pulse 90   Temp 97.7 F (36.5 C) (Oral)   Resp 16   Ht 5' 6"  (1.676 m)   Wt 121 lb 3.2 oz (55 kg)   SpO2 100%   BMI 19.56 kg/m  Filed Weights   11/04/20 0852  Weight: 121 lb 3.2 oz (55 kg)    Estimated body mass index is 19.56 kg/m as calculated from the following:   Height as of this encounter: 5' 6"  (1.676 m).   Weight as of this encounter: 121 lb 3.2 oz (55 kg).  LABS: CBC:    Component Value Date/Time   WBC 11.3 (H) 11/04/2020 0855   HGB 13.0 11/04/2020 0855   HCT 40.0 11/04/2020 0855   PLT 329 11/04/2020 0855   MCV 93.5 11/04/2020 0855   NEUTROABS 4.8 10/23/2020 0857   LYMPHSABS 1.2 10/23/2020 0857   MONOABS 0.3 10/23/2020 0857   EOSABS 0.1 10/23/2020 0857   BASOSABS 0.0 10/23/2020 0857   Comprehensive Metabolic Panel:    Component Value Date/Time   NA 134 (L) 11/04/2020 0855   K 3.6 11/04/2020 0855   CL 96 (L)  11/04/2020 0855   CO2 26 11/04/2020 0855   BUN 21 11/04/2020 0855   CREATININE 1.03 (H) 11/04/2020 0855   CREATININE 0.78 03/08/2014 0822   GLUCOSE 106 (H) 11/04/2020 0855   CALCIUM 8.8 (L) 11/04/2020 0855   AST 25 11/04/2020 0855   ALT 17 11/04/2020 0855   ALKPHOS 82 11/04/2020 0855   BILITOT 1.3 (H) 11/04/2020 0855   PROT 6.7 11/04/2020 0855   ALBUMIN 3.4 (L) 11/04/2020 0855    RADIOGRAPHIC STUDIES: CT Abdomen Pelvis W Contrast  Result Date: 11/04/2020 CLINICAL DATA:  65 year old female with suspected bowel obstruction. Emesis. History of ovarian cancer. EXAM: CT ABDOMEN AND PELVIS WITH CONTRAST TECHNIQUE: Multidetector CT imaging of the abdomen and pelvis was performed using the standard protocol following bolus administration of intravenous contrast. CONTRAST:  62m OMNIPAQUE IOHEXOL 300 MG/ML  SOLN COMPARISON:  CT the abdomen and pelvis 10/27/2020. FINDINGS: Lower chest: Low-attenuation around the distal esophagus, presumably ascites which has extends cephalad above the esophageal hiatus. Hepatobiliary: 2 hypovascular heterogeneously enhancing lesions are again noted in the liver, largest of which is in the left lobe of the liver predominantly in segment 4B (axial image 18 of series 2) measuring 4.5 x 3.6 x 3.9 cm (axial image 18 of series 2 and coronal image 29 of series 5). In segment 6 of the liver (axial image 18 of series 2) there is a smaller lesion measuring 2.6 x 2.5 x 2.2 cm. No other new hepatic lesions. No intra or extrahepatic biliary ductal dilatation. Diffuse periportal edema. Amorphous material lying dependently in the gallbladder, likely to reflect biliary sludge. Pancreas: No pancreatic mass. No pancreatic ductal dilatation. No pancreatic or peripancreatic fluid collections or inflammatory changes. Spleen: Unremarkable. Adrenals/Urinary Tract: Subcentimeter low-attenuation lesion in the upper pole of the right kidney, too small to characterize, but statistically likely  to  represent a cyst. Left kidney and bilateral adrenal glands are normal in appearance. No hydroureteronephrosis. Urinary bladder is nearly decompressed, but otherwise unremarkable in appearance. Stomach/Bowel: The appearance of the stomach is normal. Dilatation of the small proximal to mid small bowel, which measures up to 5.1 cm in diameter, with multiple air-fluid levels. Distal small bowel is completely decompressed. Small volume of stool throughout the colon. Exact transition point in the small bowel is not confidently identified on today's examination. Appendix is not visualized, likely surgically absent. Vascular/Lymphatic: Aortic atherosclerosis, without evidence of aneurysm or dissection in the abdominal or pelvic vasculature. Multiple prominent but nonenlarged retroperitoneal lymph nodes, many of which are partially calcified. No definite lymphadenopathy otherwise noted in the abdomen or pelvis. Reproductive: Status post total abdominal hysterectomy and bilateral salpingo-oophorectomy. Other: Large volume of ascites. Small centrally low-attenuation peripherally intermediate to high attenuation lesion in the left perirectal region (axial image 74 of series 2) measuring 2.1 x 1.7 cm, stable compared to prior examinations. No pneumoperitoneum. Musculoskeletal: There are no aggressive appearing lytic or blastic lesions noted in the visualized portions of the skeleton. IMPRESSION: 1. Today's examination is very similar to the recent prior study, again demonstrating dilatation of the small bowel indicative of small-bowel obstruction. Transition point is not confidently identified, however, the ileum appears decompressed indicating that the point of obstruction is presumably in the distal jejunum or proximal ileum. 2. Persistent large volume of presumably malignant ascites. Stable small left perirectal lesion which likely represents a peritoneal implant. Hypovascular heterogeneously enhancing metastatic liver  lesions appear stable to slightly increased in size compared to the recent prior examination. 3. Diffuse periportal edema in the liver. 4. Aortic atherosclerosis. 5. Additional incidental findings, as above. Electronically Signed   By: Vinnie Langton M.D.   On: 11/04/2020 10:48   CT ABDOMEN PELVIS W CONTRAST  Result Date: 10/27/2020 CLINICAL DATA:  Abdominal pain vomiting.  History of ovarian cancer. EXAM: CT ABDOMEN AND PELVIS WITH CONTRAST TECHNIQUE: Multidetector CT imaging of the abdomen and pelvis was performed using the standard protocol following bolus administration of intravenous contrast. CONTRAST:  67m OMNIPAQUE IOHEXOL 300 MG/ML  SOLN COMPARISON:  PET-CT 09/12/2020.  Abdomen/pelvis CT 12/01/2019 FINDINGS: Lower chest: Fluid is identified around the distal esophagus. Hepatobiliary: Periportal edema noted. New 3.5 x 3.4 cm heterogeneous lesion is identified in the left liver near the junction of the medial and lateral segments. There is a new heterogeneous lesion in the posterior right liver measuring 2.2 cm on image 21/2. Gallbladder unremarkable. No intrahepatic or extrahepatic biliary dilation. Pancreas: No focal mass lesion. No dilatation of the main duct. No intraparenchymal cyst. No peripancreatic edema. Spleen: No splenomegaly. No focal mass lesion. Adrenals/Urinary Tract: No adrenal nodule or mass. 8 mm probable cyst upper pole right kidney is similar to prior. Left kidney unremarkable. No evidence for hydroureter. The urinary bladder appears normal for the degree of distention. Stomach/Bowel: Possible wall thickening at the distal esophagus/esophagogastric junction. Stomach otherwise unremarkable. Duodenum is normally positioned as is the ligament of Treitz. Diffuse small bowel dilatation measures up to 5 cm diameter in the right pelvis (image 57/2). A discrete transition zone is not identified, but distal small bowel and terminal ileum are decompressed. Terminal ileum is visible on coronal  image 45/3. Colon is nondilated. Rectal anastomosis evident. Vascular/Lymphatic: There is abdominal aortic atherosclerosis without aneurysm. Calcified retroperitoneal nodes are similar to PET-CT from 6 weeks ago. Portal vein and superior mesenteric vein are patent. Splenic vein is patent. No pelvic  sidewall lymphadenopathy. Reproductive: Uterus surgically absent.  There is no adnexal mass. Other: Large volume intraperitoneal free fluid is similar to 09/12/2020. Small mixed attenuation left perirectal nodule is stable. No discrete measurable peritoneal nodule is evident. Musculoskeletal: No worrisome lytic or sclerotic osseous abnormality. IMPRESSION: 1. Interval development of diffuse small bowel dilatation up to 5 cm diameter with decompressed distal small bowel and terminal ileum. A discrete transition zone is not identified, but findings are compatible with distal small bowel obstruction. 2. Left hepatic lobe lesion is progressive since PET-CT 6 weeks ago, demonstrated to be hypermetabolic and consistent with peritoneal disease. There is a new lesion in the posterior right liver compatible with metastatic deposit. 3. Large volume intraperitoneal free fluid is similar to 09/12/2020. As before, fluid generates mass-effect on the liver capsule. 4. Possible wall thickening at the distal esophagus/esophagogastric junction. Esophagitis would be a consideration although tumor involvement not excluded. 5. Stable small mixed attenuation left perirectal nodule. 6. Aortic Atherosclerosis (ICD10-I70.0). Electronically Signed   By: Misty Stanley M.D.   On: 10/27/2020 13:31   DG Abd 2 Views  Result Date: 10/30/2020 CLINICAL DATA:  Small-bowel obstruction EXAM: ABDOMEN - 2 VIEW COMPARISON:  10/28/2020 FINDINGS: Gastric catheter is noted within the stomach stable in appearance from the prior exam. Scattered large and small bowel gas is seen. Fecal material is noted throughout the colon. No obstructive changes are seen. No  free air is noted. IMPRESSION: Gastric catheter within the stomach. No obstructive changes are seen. Electronically Signed   By: Inez Catalina M.D.   On: 10/30/2020 08:42   DG Abd Portable 1 View  Result Date: 11/04/2020 CLINICAL DATA:  NG-tube placement, vomiting EXAM: PORTABLE ABDOMEN - 1 VIEW COMPARISON:  10/30/2020 abdominal radiograph and 11/04/2020 CT abdomen/pelvis FINDINGS: Enteric tube terminates in the proximal body of the stomach. Mildly dilated central abdominal small bowel loop. Relatively gasless abdomen. No evidence of pneumatosis or pneumoperitoneum. Excreted contrast seen in the bilateral renal collecting systems. IMPRESSION: 1. Enteric tube terminates in the proximal body of the stomach. 2. Mildly dilated central abdominal small bowel loop, compatible with the partial small bowel obstruction described on CT study from earlier today. Electronically Signed   By: Ilona Sorrel M.D.   On: 11/04/2020 12:13   DG Abd Portable 1V  Result Date: 10/28/2020 CLINICAL DATA:  Nasogastric tube placement EXAM: PORTABLE ABDOMEN - 1 VIEW COMPARISON:  10/02/2019 FINDINGS: Nasogastric tube tip overlies the proximal body of the stomach. The visualized abdominal gas pattern is normal. Pelvis excluded from view. IMPRESSION: Nasogastric tube tip overlying the proximal body of the stomach. Electronically Signed   By: Fidela Salisbury MD   On: 10/28/2020 05:58    PERFORMANCE STATUS (ECOG) : 2 - Symptomatic, <50% confined to bed  Review of Systems Unless otherwise noted, a complete review of systems is negative.  Physical Exam General: NAD HEENT: NGT Cardiac: RRR Pulmonary: Unlabored, CTA Abd: distended, BS + Extremities: no edema, no joint deformities Skin: no rashes Neurological: Weakness but otherwise nonfocal  IMPRESSION: Patient and daughter seen in the ER.  CT of the abdomen/pelvis again demonstrates SBO and large volume malignant ascites.  No flatus or BM in past several days. Patient is  symptomatically improved with gastric decompression via NG tube.    She had many questions about option of venting PEG and nutrition.  She understands that venting PEG would essentially take the place of the NGT for gastric decompression and allow for palliation of her symptoms if she fails  to improve with conservative management.  Unfortunately, I fear that her large volume ascites could potentially complicate any abdominal tube causing significant leakage.  Surgical consult is pending.  Both patient and daughter want to readdress surgical options.  I would recommend paracentesis for therapeutic management of malignant ascites.  Consider use of octreotide, dexamethasone, and metoclopramide for medical management of presumed MBO.   Patient has questions for medical and GYN oncology. Discussed with Dr. Grayland Ormond and Beckey Rutter, NP. Patient is scheduled to see Dr. Theora Gianotti on Wednesday. Patient requested inpatient consultation if possible.   PLAN: -Continue current scope of treatment -Recommend octreotide 100 mcg SQ or IV 3 times daily -Recommend dexamethasone 4 mg IV every 12 hours -Continue NGT to suction -Consider metoclopramide and haloperidol for malignant bowel obstruction if no clinical improvement in nausea -Recommend therapeutic paracentesis  -Morphine as needed for pain -DNR/DNI  Case and plan discussed with Drs. Finnegan and Agbata  Time Total: 60 minutes  Visit consisted of counseling and education dealing with the complex and emotionally intense issues of symptom management and palliative care in the setting of serious and potentially life-threatening illness.Greater than 50%  of this time was spent counseling and coordinating care related to the above assessment and plan.  Signed by: Altha Harm, PhD, NP-C

## 2020-11-04 NOTE — ED Notes (Signed)
Pt to CT

## 2020-11-04 NOTE — ED Notes (Signed)
Emptied out NG tube suction canister at this time. 700 mL drainage noted.

## 2020-11-04 NOTE — ED Provider Notes (Signed)
[facility] Emergency Department Provider Note   ____________________________________________   Event Date/Time   First MD Initiated Contact with Patient 11/04/20 [phone]     (approximate)  I have reviewed the triage vital signs and the nursing notes.   HISTORY  Chief Complaint Emesis    HPI Amanda Pena is a 65 y.o. female with past medical history of hyperlipidemia and metastatic ovarian cancer who presents to the ED complaining of nausea and vomiting.  Patient was recently admitted to the hospital for bowel obstruction related to metastatic disease affecting her colon.  She was treated with NG tube and told she was not a candidate for surgical intervention.  Her symptoms improved and she was discharged home from the hospital 4 days ago.  2 days later, she reports developing recurrent abdominal pain with distention, nausea, and vomiting.  Her last bowel movement was 3 days ago.  Pain is sharp and primarily affects the left upper and lower quadrants of her abdomen.  She has vomited multiple times and has been unable to tolerate any liquids or solids since onset of pain.        Past Medical History:  Diagnosis Date  . Cancer ([place])   . Family history of breast cancer   . Family history of lung cancer   . HYPERLIPIDEMIA 04/09/2008   Qualifier: Diagnosis of  By: Wynona Luna   . HYPOTHYROIDISM 04/09/2008   Qualifier: Diagnosis of  By: Wynona Luna   . INSOMNIA, CHRONIC 08/21/2010   Qualifier: Diagnosis of  By: Wynona Luna   . PERSONAL HISTORY MALIGNANT NEOPLASM THYROID 08/21/2010   Qualifier: Diagnosis of  By: Wynona Luna     Patient Active Problem List   Diagnosis Date Noted  . Malnutrition of moderate degree 10/28/2020  . Small bowel obstruction due to adhesions ([place]) 10/28/2020  . Palliative care encounter   . SBO (small bowel obstruction) ([place]) 10/27/2020  . 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  COVID-19 vaccine series declined 05/28/2020  . Peritoneal carcinomatosis ([place]) 12/19/2019  . Genetic testing 11/16/2019  . Family history of breast cancer   . Family history of lung cancer   . Postoperative pain 10/04/2019  . Other constipation 10/04/2019  . Peritoneal carcinoma ([place]) 06/23/2019  . Malignant neoplasm of ovary ([place]) 06/23/2019  . Abdominal pain 06/13/2019  . LLQ abdominal pain 06/13/2019  . Calcific bursitis of shoulder 01/29/2016  . Cough 10/15/2014  . Cervical pain (neck) 10/15/2014  . Situational anxiety 03/07/2014  . Goals of care, counseling/discussion 02/10/2013  . 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 Preventative health care 11/09/2011  . INSOMNIA, CHRONIC 08/21/2010  . PERSONAL HISTORY MALIGNANT NEOPLASM THYROID 08/21/2010  . Hypothyroidism 04/09/2008  . Hyperlipidemia 04/09/2008  . Sinusitis 04/09/2008    Past Surgical History:  Procedure Laterality Date  . IR IMAGING GUIDED PORT INSERTION  07/18/2019  . THYROIDECTOMY, PARTIAL      Prior to Admission medications   Medication Sig Start Date End Date Taking? Authorizing Provider  ALPRAZolam Duanne Moron) 0.5 MG tablet Take 1 tablet (0.5 mg total) by mouth 2 (two) times daily as needed for anxiety. 03/14/20  Yes Plotnikov, [name], MD  amitriptyline (ELAVIL) 50 MG tablet Take 1 tablet (50 mg total) by mouth at bedtime. 10/25/20  Yes Vaslow, Acey Lav, MD  Artificial Tear Solution (20/20 ARTIFICIAL TEARS OP) Apply 2 drops to eye daily as needed (dry eyes).  Yes Plotnikov, [name], MD  Ascorbic Acid (VITAMIN C) 1000 MG tablet Take 1,000 mg by mouth in the morning and at bedtime.    Yes [provider]  Cholecalciferol (VITAMIN D3) 50 MCG (2000 UT) TABS Take 2,000 Units by mouth in the morning and at bedtime.   Yes [provider]  dexamethasone (DECADRON) 4 MG tablet Take 1 tablet (4 mg total) by mouth daily. 10/31/20 10/31/21 Yes Nolberto Hanlon, MD  famotidine (PEPCID) 20 MG tablet  Take 1 tablet (20 mg total) by mouth daily. 10/31/20 11/30/20 Yes Nolberto Hanlon, MD  lidocaine-prilocaine (EMLA) cream Apply generously to the port area 45-60 mins prior. 10/16/20  Yes Lloyd Huger, MD  MELATONIN PO Take 5 mg by mouth daily as needed (sleep).   Yes Plotnikov, [name], MD  ondansetron (ZOFRAN) 8 MG tablet One pill every 8 hours as needed for nausea/vomitting. 06/23/19  Yes Cammie Sickle, MD  polyethylene glycol (MIRALAX / GLYCOLAX) 17 g packet Take 17 g by mouth daily as needed.    Yes [provider]  SYNTHROID 75 MCG tablet Take 1 tablet (75 mcg total) by mouth daily before breakfast. 09/09/20  Yes Plotnikov, [name], MD  zolpidem (AMBIEN) 10 MG tablet TAKE 1 TABLET BY MOUTH EVERY DAY AT BEDTIME AS NEEDED FOR SLEEP Patient taking differently: Take 10 mg by mouth at bedtime as needed for sleep. 07/17/20  Yes Lloyd Huger, MD  Omega-3 Fatty Acids (FISH OIL) 1000 MG CAPS Take 2 capsules by mouth daily. Patient not taking: Reported on 11/04/2020    [provider]  Probiotic Product (PROBIOTIC-10) CHEW Chew 1 capsule by mouth. Patient not taking: Reported on 11/04/2020    [provider]  rosuvastatin (CRESTOR) 5 MG tablet Take 1 tablet (5 mg total) by mouth daily. 05/17/19 05/17/19  Plotnikov, [name], MD    Allergies Patient has no known allergies.  Family History  Problem Relation Age of Onset  . Arthritis Mother   . Hyperlipidemia Mother   . Hyperlipidemia Father   . Diabetes Father   . Hypertension Father   . Heart disease Father 29       CAD  . Lung cancer Father   . Breast cancer Maternal Grandmother        in 44s  . Cancer Maternal Aunt        unk type  . Cancer Maternal Uncle        unk type  . Cancer Paternal Uncle        unk type, d. 43s  . Skin cancer Daughter        basal cell     Social History Social History   Tobacco Use  . Smoking status: Never Smoker  . Smokeless tobacco: Never Used  Vaping  Use  . Vaping Use: Never used  Substance Use Topics  . Alcohol use: Not Currently  . Drug use: No    Review of Systems  Constitutional: No fever/chills Eyes: No visual changes. ENT: No sore throat. Cardiovascular: Denies chest pain. Respiratory: Denies shortness of breath. Gastrointestinal: Positive for abdominal pain, nausea, and vomiting.  No diarrhea.  No constipation. Genitourinary: Negative for dysuria. Musculoskeletal: Negative for back pain. Skin: Negative for rash. Neurological: Negative for headaches, focal weakness or numbness.  ____________________________________________   PHYSICAL EXAM:  VITAL SIGNS: ED Triage Vitals [11/04/20 0852]  Enc Vitals Group     BP      Pulse      Resp  Temp      Temp src      SpO2      Weight 121 lb 3.2 oz (55 kg)     Height 5\' 6"  (1.676 m)     Head Circumference      Peak Flow      Pain Score 7     Pain Loc      Pain Edu?      Excl. in [place]?     Constitutional: Alert and oriented. Eyes: Conjunctivae are normal. Head: Atraumatic. Nose: No congestion/rhinnorhea. Mouth/Throat: Mucous membranes are moist. Neck: Normal ROM Cardiovascular: Normal rate, regular rhythm. Grossly normal heart sounds.  Right IJ Mediport, currently with access in place. Respiratory: Normal respiratory effort.  No retractions. Lungs CTAB. Gastrointestinal: Soft and distended with diffuse tenderness, greatest in the left side of her abdomen. Genitourinary: deferred Musculoskeletal: No lower extremity tenderness nor edema. Neurologic:  Normal speech and language. No gross focal neurologic deficits are appreciated. Skin:  Skin is warm, dry and intact. No rash noted. Psychiatric: Mood and affect are normal. Speech and behavior are normal.  ____________________________________________   LABS (all labs ordered are listed, but only abnormal results are displayed)  Labs Reviewed  COMPREHENSIVE METABOLIC PANEL - Abnormal; Notable for the following  components:      Result Value   Sodium 134 (*)    Chloride 96 (*)    Glucose, Bld 106 (*)    Creatinine, Ser 1.03 (*)    Calcium 8.8 (*)    Albumin 3.4 (*)    Total Bilirubin 1.3 (*)    All other components within normal limits  CBC - Abnormal; Notable for the following components:   WBC 11.3 (*)    RDW 17.4 (*)    All other components within normal limits  RESP PANEL BY RT-PCR (FLU A&B, COVID) ARPGX2  LIPASE, BLOOD  URINALYSIS, COMPLETE (UACMP) WITH MICROSCOPIC   ____________________________________________  EKG  ED ECG REPORT I, Blake Divine, the attending physician, personally viewed and interpreted this ECG.   Date: 11/04/2020  EKG Time: 9:02  Rate: 96  Rhythm: normal sinus rhythm  Axis: LAD  Intervals:left anterior fascicular block  ST&T Change: Lateral T wave inversions   PROCEDURES  Procedure(s) performed (including Critical Care):  Procedures   ____________________________________________   INITIAL IMPRESSION / ASSESSMENT AND PLAN / ED COURSE       65 year old female with past medical history of hyperlipidemia and metastatic ovarian cancer who presents to the ED with recurrent abdominal pain, distention, nausea, and vomiting.  Suspect patient with recurrent bowel obstruction and we will further assess with CT scan and labs, treat symptomatically with IV morphine and Zofran, hydrate with IV fluids.  She will likely require readmission to the hospital with general surgery consult.  CT scan shows bowel obstruction with transition point near the distal jejunum or proximal ileum.  Case discussed with general surgery given potential involvement with her malignancy.  We will place NG tube and case discussed with hospitalist for admission.      ____________________________________________   FINAL CLINICAL IMPRESSION(S) / ED DIAGNOSES  Final diagnoses:  Complete intestinal obstruction, unspecified cause ([place])  Metastatic malignant neoplasm, unspecified  site [facility])     ED Discharge Orders    None       Note:  This document was prepared using Dragon voice recognition software and may include unintentional dictation errors.   Blake Divine, MD 11/04/20 [phone]

## 2020-11-04 NOTE — ED Notes (Signed)
Port accessed by [employer]

## 2020-11-04 NOTE — ED Triage Notes (Signed)
Pt to ED for emesis. Recently admitted for SBO. Reports emesis Saturday night, stopped Sunday, started again this morning. Denies fever.  Undergoing chemo for ovarian cancer.  Reports generalized abdominal and back pain  Last chemo treatment a couple weeks ago

## 2020-11-04 NOTE — ED Notes (Signed)
MD Agbata messaged regarding pt wanting anxiety medication

## 2020-11-05 ENCOUNTER — Inpatient Hospital Stay: Payer: Self-pay

## 2020-11-05 ENCOUNTER — Inpatient Hospital Stay: Payer: Self-pay | Admitting: Hospice and Palliative Medicine

## 2020-11-05 ENCOUNTER — Inpatient Hospital Stay: Payer: Self-pay | Admitting: Oncology

## 2020-11-05 LAB — URINALYSIS, COMPLETE (UACMP) WITH MICROSCOPIC
Bilirubin Urine: NEGATIVE
Glucose, UA: NEGATIVE mg/dL
Hgb urine dipstick: NEGATIVE
Ketones, ur: 80 mg/dL — AB
Leukocytes,Ua: NEGATIVE
Nitrite: NEGATIVE
Protein, ur: NEGATIVE mg/dL
Specific Gravity, Urine: 1.028 (ref 1.005–1.030)
Squamous Epithelial / HPF: NONE SEEN (ref 0–5)
pH: 5 (ref 5.0–8.0)

## 2020-11-05 LAB — BASIC METABOLIC PANEL
Anion gap: 10 (ref 5–15)
BUN: 21 mg/dL (ref 8–23)
CO2: 24 mmol/L (ref 22–32)
Calcium: 8 mg/dL — ABNORMAL LOW (ref 8.9–10.3)
Chloride: 103 mmol/L (ref 98–111)
Creatinine, Ser: 0.77 mg/dL (ref 0.44–1.00)
GFR, Estimated: >60 mL/min (ref >60)
Glucose, Bld: 66 mg/dL — ABNORMAL LOW (ref 70–99)
Potassium: 4.7 mmol/L (ref 3.5–5.1)
Sodium: 137 mmol/L (ref 135–145)

## 2020-11-05 LAB — CBC
HCT: 31.6 % — ABNORMAL LOW (ref 36.0–46.0)
Hemoglobin: 10 g/dL — ABNORMAL LOW (ref 12.0–15.0)
MCH: 30.1 pg (ref 26.0–34.0)
MCHC: 31.6 g/dL (ref 30.0–36.0)
MCV: 95.2 fL (ref 80.0–100.0)
Platelets: 191 10*3/uL (ref 150–400)
RBC: 3.32 MIL/uL — ABNORMAL LOW (ref 3.87–5.11)
RDW: 17.2 % — ABNORMAL HIGH (ref 11.5–15.5)
WBC: 6.7 10*3/uL (ref 4.0–10.5)
nRBC: 0 % (ref 0.0–0.2)

## 2020-11-05 MED ORDER — OCTREOTIDE ACETATE 100 MCG/ML IJ SOLN
100.0000 ug | Freq: Three times a day (TID) | INTRAMUSCULAR | Status: DC
  Administered 2020-11-05 – 2020-11-07 (×6): 100 ug via INTRAVENOUS
  Filled 2020-11-05 (×10): qty 1

## 2020-11-05 MED ORDER — DEXTROSE-NACL 5-0.45 % IV SOLN: INTRAVENOUS | Status: DC

## 2020-11-05 MED ORDER — DEXAMETHASONE SODIUM PHOSPHATE 4 MG/ML IJ SOLN
4.0000 mg | Freq: Two times a day (BID) | INTRAMUSCULAR | Status: DC
  Administered 2020-11-05 – 2020-11-07 (×4): 4 mg via INTRAVENOUS
  Filled 2020-11-05 (×6): qty 1

## 2020-11-05 NOTE — Progress Notes (Addendum)
[place]  [phone] [phone]   Name: Amanda Pena Date: 11/05/2020 MRN: [id number]  DOB: March 24, 1956  Patient Care Team: Cassandria Anger, MD as PCP - General (Internal Medicine) Arvella Nigh, MD as Consulting Physician (Obstetrics and Gynecology) Clent Jacks, RN as Oncology Nurse Navigator    REASON FOR CONSULTATION: Amanda Pena is a 65 y.o. 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 PMH also notable for CIPN, history of thyroid cancer status post thyroidectomy, anxiety, depression, insomnia, and hyperlipidemia.  Patient is followed locally by [facility]. She has most recently been treated with third line Taxol/Avastin.  Patient was hospitalized 10/27/20 to 10/31/2020 with intractable nausea, vomiting and abdominal pain.  CT of the abdomen and pelvis was consistent with SBO and progressive liver metastasis and worsening peritoneal disease.  Patient's symptoms improved with conservative management including use of gastric decompression via NGT and octreotide/dexamethasone.  Patient was able to tolerate a liquid diet at time of discharge but then nausea and vomiting recurred two days after discharge.  Patient is now readmitted 11/04/2020 with recurrent SBO, presumably of malignant etiology.  Patient was referred to palliative care to help address goals and manage ongoing symptoms.  CODE STATUS: DNR  PAST MEDICAL HISTORY: Past Medical History:  Diagnosis Date  . Cancer ([place])   . Family history of breast cancer   . Family history of lung cancer   . HYPERLIPIDEMIA 04/09/2008   Qualifier: Diagnosis of  By: Wynona Luna   . HYPOTHYROIDISM 04/09/2008    Qualifier: Diagnosis of  By: Wynona Luna   . INSOMNIA, CHRONIC 08/21/2010   Qualifier: Diagnosis of  By: Wynona Luna   . PERSONAL HISTORY MALIGNANT NEOPLASM THYROID 08/21/2010   Qualifier: Diagnosis of  By: Wynona Luna     PAST SURGICAL HISTORY:  Past Surgical History:  Procedure Laterality Date  . IR IMAGING GUIDED PORT INSERTION  07/18/2019  . THYROIDECTOMY, PARTIAL      HEMATOLOGY/ONCOLOGY HISTORY:  Oncology History Overview Note  #November 2020-omental caking/peritoneal carcinomatosis; bilateral adnexal masses 4-5 cm in size; multiple lung nodules;   # dec 8th 2020-omental biopsy; positive for high-grade serous adenocarcinoma; GYN origin.  CT scan chest-bilateral lung nodules; 3.8 cm soft tissue mass adjacent to GE junction  # DEC 11TH 2020-CARBO-Taxol [chemo consent] x 3 cycles; February 2021-improvement of the peritoneal carcinomatosis; lung lesions.. 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  Partial colectomy  # #Genetic counseling-s/p NEG myRiskMyriad.   # Thyroid cancer at 53y s/p thyroidectomy [no RAIU]  # NGS/MOLECULAR TESTS: My risk myriad negative; HRD negative; NGS-P  # PALLIATIVE CARE EVALUATION:P  # PAIN MANAGEMENT: P   DIAGNOSIS: Bilateral ovarian cancer/tube/peritoneal  STAGE: IV   ;  GOALS: Control  CURRENT/MOST RECENT THERAPY : Carbotaxol [C]    Peritoneal carcinoma ([place])  06/23/2019 Initial Diagnosis   Peritoneal carcinoma ([place])   06/30/2019 - 11/17/2019 Chemotherapy   The patient had dexamethasone (DECADRON) 4 MG tablet, 8 mg, Oral, Daily, 1 of 1 cycle, Start date: --, End date: -- palonosetron (ALOXI) injection 0.25 mg, 0.25 mg, Intravenous,  Once, 6 of 6 cycles Administration: 0.25 mg (06/30/2019), 0.25 mg (07/24/2019), 0.25 mg (08/14/2019), 0.25 mg (10/06/2019),  0.25 mg (10/27/2019), 0.25 mg (11/17/2019) pegfilgrastim (NEULASTA ONPRO KIT)  injection 6 mg, 6 mg, Subcutaneous, Once, 1 of 1 cycle Administration: 6 mg (11/17/2019) CARBOplatin (PARAPLATIN) 570 mg in sodium chloride 0.9 % 250 mL chemo infusion, 570 mg (100 % of original dose 567 mg), Intravenous,  Once, 6 of 6 cycles Dose modification:   (original dose 567 mg, Cycle 1) Administration: 570 mg (06/30/2019), 570 mg (07/24/2019), 570 mg (08/14/2019), 520 mg (10/27/2019), 520 mg (11/17/2019) fosaprepitant (EMEND) 150 mg in sodium chloride 0.9 % 145 mL IVPB, 150 mg, Intravenous,  Once, 1 of 1 cycle PACLitaxel (TAXOL) 288 mg in sodium chloride 0.9 % 250 mL chemo infusion (> $RemoveBef'80mg'UKCwqxUsZG$ /m2), 175 mg/m2 = 288 mg, Intravenous,  Once, 6 of 6 cycles Administration: 288 mg (06/30/2019), 288 mg (07/24/2019), 288 mg (08/14/2019), 288 mg (10/06/2019), 288 mg (10/27/2019), 288 mg (11/17/2019) fosaprepitant (EMEND) 150 mg, dexamethasone (DECADRON) 12 mg in sodium chloride 0.9 % 145 mL IVPB, , Intravenous,  Once, 5 of 5 cycles Administration:  (07/24/2019),  (08/14/2019),  (10/06/2019),  (10/27/2019),  (11/17/2019)  for chemotherapy treatment.     Genetic Testing   Negative genetic testing. No pathogenic variants identified on the [employer] MyRisk+HRD. The report date is 10/02/2019.  The [facility] gene panel offered by [employer] includes sequencing and deletion/duplication testing of the following 35 genes: APC, ATM, AXIN2, BARD1, BMPR1A, BRCA1, BRCA2, BRIP1, CHD1, CDK4, CDKN2A, CHEK2, EPCAM (large rearrangement only), HOXB13, GALNT12, MLH1, MSH2, MSH3, MSH6, MUTYH, NBN, NTHL1, PALB2, PMS2, PTEN, RAD51C, RAD51D, RNF43, RPS20, SMAD4, STK11, and TP53. Sequencing was performed for select regions of POLE and POLD1, and large rearrangement analysis was performed for select regions of GREM1.    12/19/2019 - 12/19/2019 Chemotherapy   The patient had DOXOrubicin HCL LIPOSOMAL (DOXIL) 64 mg in dextrose 5 % 250 mL chemo infusion, 40 mg/m2, Intravenous,  Once, 0 of 6 cycles bevacizumab-bvzr (ZIRABEV) 500 mg in sodium  chloride 0.9 % 100 mL chemo infusion, 10 mg/kg, Intravenous,  Once, 0 of 6 cycles  for chemotherapy treatment.    07/17/2020 -  Chemotherapy    Patient is on Treatment Plan: OVARIAN PACLITAXEL  O1,2,24,82 + BEVACIZUMAB D1,15 Q28D      Malignant neoplasm of ovary ([place])  06/23/2019 Initial Diagnosis   Malignant neoplasm of ovary ([place])   12/19/2019 - 12/19/2019 Chemotherapy   The patient had DOXOrubicin HCL LIPOSOMAL (DOXIL) 64 mg in dextrose 5 % 250 mL chemo infusion, 40 mg/m2, Intravenous,  Once, 0 of 6 cycles bevacizumab-bvzr (ZIRABEV) 500 mg in sodium chloride 0.9 % 100 mL chemo infusion, 10 mg/kg, Intravenous,  Once, 0 of 6 cycles  for chemotherapy treatment.    07/17/2020 -  Chemotherapy    Patient is on Treatment Plan: OVARIAN PACLITAXEL  N0,0,37,04 + BEVACIZUMAB D1,15 Q28D      07/17/2020 Cancer Staging   Staging form: Ovary, Fallopian Tube, and Primary Peritoneal Carcinoma, AJCC 8th Edition - Clinical: FIGO Stage IVA (pM1a) - Signed by Lloyd Huger, MD on 07/17/2020   Peritoneal carcinomatosis ([place])  12/19/2019 - 12/19/2019 Chemotherapy   The patient had DOXOrubicin HCL LIPOSOMAL (DOXIL) 64 mg in dextrose 5 % 250 mL chemo infusion, 40 mg/m2, Intravenous,  Once, 0 of 6 cycles bevacizumab-bvzr (ZIRABEV) 500 mg in sodium chloride 0.9 % 100 mL chemo infusion, 10 mg/kg, Intravenous,  Once, 0 of 6 cycles  for chemotherapy treatment.    12/19/2019 Initial Diagnosis   Peritoneal carcinomatosis ([place])   07/17/2020 -  Chemotherapy   The patient had  PACLitaxel (TAXOL) 126 mg in sodium chloride 0.9 % 250 mL chemo infusion (</= $RemoveBefor'80mg'XJStiBoQNjQK$ /m2), 80 mg/m2 = 126 mg, Intravenous,  Once, 0 of 4 cycles bevacizumab-bvzr (ZIRABEV) 500 mg in sodium chloride 0.9 % 100 mL chemo infusion, 10 mg/kg = 500 mg, Intravenous,  Once, 0 of 4 cycles  for chemotherapy treatment.    07/17/2020 -  Chemotherapy    Patient is on Treatment Plan: OVARIAN PACLITAXEL  F5,7,32,20 + BEVACIZUMAB D1,15 Q28D         ALLERGIES:  has No Known Allergies.  MEDICATIONS:  Current Facility-Administered Medications  Medication Dose Route Frequency Provider Last Rate Last Admin  . 0.9 % NaCl with KCl 40 mEq / L  infusion   Intravenous Continuous Agbata, Tochukwu, MD 100 mL/hr at 11/05/20 1203 New Bag at 11/05/20 1203  . Chlorhexidine Gluconate Cloth 2 % PADS 6 each  6 each Topical Daily Agbata, Tochukwu, MD   6 each at 11/05/20 0926  . dexamethasone (DECADRON) injection 4 mg  4 mg Intravenous Q12H [place] Capers R, NP      . enoxaparin (LOVENOX) injection 40 mg  40 mg Subcutaneous Q24H Agbata, Tochukwu, MD      . lidocaine-prilocaine (EMLA) cream   Topical Once Agbata, Tochukwu, MD      . LORazepam (ATIVAN) injection 0.5 mg  0.5 mg Intravenous BID PRN Opyd, Ilene Qua, MD   0.5 mg at 11/05/20 0924  . morphine 2 MG/ML injection 2 mg  2 mg Intravenous Q4H PRN Agbata, Tochukwu, MD      . octreotide (SANDOSTATIN) injection 100 mcg  100 mcg Intravenous TID Annel Zunker, Kirt Boys, NP      . ondansetron (ZOFRAN) tablet 4 mg  4 mg Oral Q6H PRN Agbata, Tochukwu, MD       Or  . ondansetron (ZOFRAN) injection 4 mg  4 mg Intravenous Q6H PRN Agbata, Tochukwu, MD      . pantoprazole (PROTONIX) injection 40 mg  40 mg Intravenous Q24H Agbata, Tochukwu, MD   40 mg at 11/04/20 1444    VITAL SIGNS: BP 112/79 (BP Location: Right Arm)   Pulse 93   Temp (!) 97.5 F (36.4 C) (Oral)   Resp 15   Ht $R'5\' 6"'oK$  (1.676 m)   Wt 119 lb 11.2 oz (54.3 kg)   SpO2 100%   BMI 19.32 kg/m  Filed Weights   11/04/20 0852 11/04/20 2221  Weight: 121 lb 3.2 oz (55 kg) 119 lb 11.2 oz (54.3 kg)    Estimated body mass index is 19.32 kg/m as calculated from the following:   Height as of this encounter: $RemoveBeforeD'5\' 6"'yaCkaumeHwLWIB$  (1.676 m).   Weight as of this encounter: 119 lb 11.2 oz (54.3 kg).  LABS: CBC:    Component Value Date/Time   WBC 6.7 11/05/2020 0551   HGB 10.0 (L) 11/05/2020 0551   HCT 31.6 (L) 11/05/2020 0551   PLT 191 11/05/2020 0551   MCV 95.2  11/05/2020 0551   NEUTROABS 4.8 10/23/2020 0857   LYMPHSABS 1.2 10/23/2020 0857   MONOABS 0.3 10/23/2020 0857   EOSABS 0.1 10/23/2020 0857   BASOSABS 0.0 10/23/2020 0857   Comprehensive Metabolic Panel:    Component Value Date/Time   NA 137 11/05/2020 0551   K 4.7 11/05/2020 0551   CL 103 11/05/2020 0551   CO2 24 11/05/2020 0551   BUN 21 11/05/2020 0551   CREATININE 0.77 11/05/2020 0551   CREATININE 0.78 03/08/2014 0822   GLUCOSE 66 (L) 11/05/2020 0551   CALCIUM 8.0 (  L) 11/05/2020 0551   AST 25 11/04/2020 0855   ALT 17 11/04/2020 0855   ALKPHOS 82 11/04/2020 0855   BILITOT 1.3 (H) 11/04/2020 0855   PROT 6.7 11/04/2020 0855   ALBUMIN 3.4 (L) 11/04/2020 0855    RADIOGRAPHIC STUDIES: CT Abdomen Pelvis W Contrast  Result Date: 11/04/2020 CLINICAL DATA:  65 year old female with suspected bowel obstruction. Emesis. History of ovarian cancer. EXAM: CT ABDOMEN AND PELVIS WITH CONTRAST TECHNIQUE: Multidetector CT imaging of the abdomen and pelvis was performed using the standard protocol following bolus administration of intravenous contrast. CONTRAST:  46mL OMNIPAQUE IOHEXOL 300 MG/ML  SOLN COMPARISON:  CT the abdomen and pelvis 10/27/2020. FINDINGS: Lower chest: Low-attenuation around the distal esophagus, presumably ascites which has extends cephalad above the esophageal hiatus. Hepatobiliary: 2 hypovascular heterogeneously enhancing lesions are again noted in the liver, largest of which is in the left lobe of the liver predominantly in segment 4B (axial image 18 of series 2) measuring 4.5 x 3.6 x 3.9 cm (axial image 18 of series 2 and coronal image 29 of series 5). In segment 6 of the liver (axial image 18 of series 2) there is a smaller lesion measuring 2.6 x 2.5 x 2.2 cm. No other new hepatic lesions. No intra or extrahepatic biliary ductal dilatation. Diffuse periportal edema. Amorphous material lying dependently in the gallbladder, likely to reflect biliary sludge. Pancreas: No  pancreatic mass. No pancreatic ductal dilatation. No pancreatic or peripancreatic fluid collections or inflammatory changes. Spleen: Unremarkable. Adrenals/Urinary Tract: Subcentimeter low-attenuation lesion in the upper pole of the right kidney, too small to characterize, but statistically likely to represent a cyst. Left kidney and bilateral adrenal glands are normal in appearance. No hydroureteronephrosis. Urinary bladder is nearly decompressed, but otherwise unremarkable in appearance. Stomach/Bowel: The appearance of the stomach is normal. Dilatation of the small proximal to mid small bowel, which measures up to 5.1 cm in diameter, with multiple air-fluid levels. Distal small bowel is completely decompressed. Small volume of stool throughout the colon. Exact transition point in the small bowel is not confidently identified on today's examination. Appendix is not visualized, likely surgically absent. Vascular/Lymphatic: Aortic atherosclerosis, without evidence of aneurysm or dissection in the abdominal or pelvic vasculature. Multiple prominent but nonenlarged retroperitoneal lymph nodes, many of which are partially calcified. No definite lymphadenopathy otherwise noted in the abdomen or pelvis. Reproductive: Status post total abdominal hysterectomy and bilateral salpingo-oophorectomy. Other: Large volume of ascites. Small centrally low-attenuation peripherally intermediate to high attenuation lesion in the left perirectal region (axial image 74 of series 2) measuring 2.1 x 1.7 cm, stable compared to prior examinations. No pneumoperitoneum. Musculoskeletal: There are no aggressive appearing lytic or blastic lesions noted in the visualized portions of the skeleton. IMPRESSION: 1. Today's examination is very similar to the recent prior study, again demonstrating dilatation of the small bowel indicative of small-bowel obstruction. Transition point is not confidently identified, however, the ileum appears  decompressed indicating that the point of obstruction is presumably in the distal jejunum or proximal ileum. 2. Persistent large volume of presumably malignant ascites. Stable small left perirectal lesion which likely represents a peritoneal implant. Hypovascular heterogeneously enhancing metastatic liver lesions appear stable to slightly increased in size compared to the recent prior examination. 3. Diffuse periportal edema in the liver. 4. Aortic atherosclerosis. 5. Additional incidental findings, as above. Electronically Signed   By: Vinnie Langton M.D.   On: 11/04/2020 10:48   CT ABDOMEN PELVIS W CONTRAST  Result Date: 10/27/2020 CLINICAL DATA:  Abdominal  pain vomiting.  History of ovarian cancer. EXAM: CT ABDOMEN AND PELVIS WITH CONTRAST TECHNIQUE: Multidetector CT imaging of the abdomen and pelvis was performed using the standard protocol following bolus administration of intravenous contrast. CONTRAST:  58mL OMNIPAQUE IOHEXOL 300 MG/ML  SOLN COMPARISON:  PET-CT 09/12/2020.  Abdomen/pelvis CT 12/01/2019 FINDINGS: Lower chest: Fluid is identified around the distal esophagus. Hepatobiliary: Periportal edema noted. New 3.5 x 3.4 cm heterogeneous lesion is identified in the left liver near the junction of the medial and lateral segments. There is a new heterogeneous lesion in the posterior right liver measuring 2.2 cm on image 21/2. Gallbladder unremarkable. No intrahepatic or extrahepatic biliary dilation. Pancreas: No focal mass lesion. No dilatation of the main duct. No intraparenchymal cyst. No peripancreatic edema. Spleen: No splenomegaly. No focal mass lesion. Adrenals/Urinary Tract: No adrenal nodule or mass. 8 mm probable cyst upper pole right kidney is similar to prior. Left kidney unremarkable. No evidence for hydroureter. The urinary bladder appears normal for the degree of distention. Stomach/Bowel: Possible wall thickening at the distal esophagus/esophagogastric junction. Stomach otherwise  unremarkable. Duodenum is normally positioned as is the ligament of Treitz. Diffuse small bowel dilatation measures up to 5 cm diameter in the right pelvis (image 57/2). A discrete transition zone is not identified, but distal small bowel and terminal ileum are decompressed. Terminal ileum is visible on coronal image 45/3. Colon is nondilated. Rectal anastomosis evident. Vascular/Lymphatic: There is abdominal aortic atherosclerosis without aneurysm. Calcified retroperitoneal nodes are similar to PET-CT from 6 weeks ago. Portal vein and superior mesenteric vein are patent. Splenic vein is patent. No pelvic sidewall lymphadenopathy. Reproductive: Uterus surgically absent.  There is no adnexal mass. Other: Large volume intraperitoneal free fluid is similar to 09/12/2020. Small mixed attenuation left perirectal nodule is stable. No discrete measurable peritoneal nodule is evident. Musculoskeletal: No worrisome lytic or sclerotic osseous abnormality. IMPRESSION: 1. Interval development of diffuse small bowel dilatation up to 5 cm diameter with decompressed distal small bowel and terminal ileum. A discrete transition zone is not identified, but findings are compatible with distal small bowel obstruction. 2. Left hepatic lobe lesion is progressive since PET-CT 6 weeks ago, demonstrated to be hypermetabolic and consistent with peritoneal disease. There is a new lesion in the posterior right liver compatible with metastatic deposit. 3. Large volume intraperitoneal free fluid is similar to 09/12/2020. As before, fluid generates mass-effect on the liver capsule. 4. Possible wall thickening at the distal esophagus/esophagogastric junction. Esophagitis would be a consideration although tumor involvement not excluded. 5. Stable small mixed attenuation left perirectal nodule. 6. Aortic Atherosclerosis (ICD10-I70.0). Electronically Signed   By: Misty Stanley M.D.   On: 10/27/2020 13:31   DG Abd 2 Views  Result Date:  11/05/2020 CLINICAL DATA:  Small bowel obstruction. EXAM: ABDOMEN - 2 VIEW COMPARISON:  11/04/2020 FINDINGS: An enteric tube remains in place terminating in the region of the proximal gastric body. There is mild small bowel dilatation in the central abdomen with a few small air-fluid levels. Stool is present throughout nondilated colon and rectum. No acute osseous abnormality is seen. IMPRESSION: Mild small bowel dilatation in the central abdomen with small air-fluid levels consistent with known obstruction. Electronically Signed   By: Logan Bores M.D.   On: 11/05/2020 11:16   DG Abd 2 Views  Result Date: 10/30/2020 CLINICAL DATA:  Small-bowel obstruction EXAM: ABDOMEN - 2 VIEW COMPARISON:  10/28/2020 FINDINGS: Gastric catheter is noted within the stomach stable in appearance from the prior exam. Scattered large and  small bowel gas is seen. Fecal material is noted throughout the colon. No obstructive changes are seen. No free air is noted. IMPRESSION: Gastric catheter within the stomach. No obstructive changes are seen. Electronically Signed   By: Inez Catalina M.D.   On: 10/30/2020 08:42   DG Abd Portable 1 View  Result Date: 11/04/2020 CLINICAL DATA:  NG-tube placement, vomiting EXAM: PORTABLE ABDOMEN - 1 VIEW COMPARISON:  10/30/2020 abdominal radiograph and 11/04/2020 CT abdomen/pelvis FINDINGS: Enteric tube terminates in the proximal body of the stomach. Mildly dilated central abdominal small bowel loop. Relatively gasless abdomen. No evidence of pneumatosis or pneumoperitoneum. Excreted contrast seen in the bilateral renal collecting systems. IMPRESSION: 1. Enteric tube terminates in the proximal body of the stomach. 2. Mildly dilated central abdominal small bowel loop, compatible with the partial small bowel obstruction described on CT study from earlier today. Electronically Signed   By: Ilona Sorrel M.D.   On: 11/04/2020 12:13   DG Abd Portable 1V  Result Date: 10/28/2020 CLINICAL DATA:   Nasogastric tube placement EXAM: PORTABLE ABDOMEN - 1 VIEW COMPARISON:  10/02/2019 FINDINGS: Nasogastric tube tip overlies the proximal body of the stomach. The visualized abdominal gas pattern is normal. Pelvis excluded from view. IMPRESSION: Nasogastric tube tip overlying the proximal body of the stomach. Electronically Signed   By: Fidela Salisbury MD   On: 10/28/2020 05:58    PERFORMANCE STATUS (ECOG) : 1 - Symptomatic but completely ambulatory  Review of Systems Unless otherwise noted, a complete review of systems is negative.  Physical Exam General: NAD HEENT: NGT Pulmonary: unlabored Abdomen: distended GU: no suprapubic tenderness Extremities: no edema, no joint deformities Skin: no rashes Neurological: Weakness but otherwise nonfocal  IMPRESSION: NG tube in place.  24-hour output 1154 mL.  Patient had a small BM this morning but no flatus.   Patient is interested in trying octreotide and dexamethasone for symptomatic relief.  Will order.  Patient says that exploratory surgery was discussed today. Patient would be interested in pursuing surgery if there is no clinical improvement.  She feels there will be risk of death either way and that she would prefer to explore any options available with hope of prolonging her life. She wants to speak with Dr. Celine Ahr tomorrow.   Patient says she is not at a point where she wants to give up or forego options to prolong life, even if options include significant inherent risk.   Patient's daughter asked about TPN.  I explained that TPN is generally not recommended in the setting of advanced malignancy. I do not think that it would likely achieve her goal of prolonging life if her overall decline is secondary to progressive malignancy.   She would like to speak with Dr. Theora Gianotti with GYN Oncology.  Patient says that she was informed of a possible clinical trial in [place].  Discussed with Beckey Rutter, NP for Dr. Theora Gianotti.   Patient was taken for  a paracentesis but declined the procedure. She says that her previous paracentesis actually resulted in her feeling symptomatically worse.   PLAN: -Continue current scope of treatment -Add octreotide 144mcg IV TID -Start dexamethasone 4mg  IV BID -Continue NGT and defer to surgical team on when/if to trial clamping -Encourage ambulation -Consider daily KUB -Referral GYN Oncology  Case and plan discussed with Dr. Grayland Ormond and Beckey Rutter, NP.   Time Total: 45 minutes  Visit consisted of counseling and education dealing with the complex and emotionally intense issues of symptom management and palliative care  in the setting of serious and potentially life-threatening illness.Greater than 50%  of this time was spent counseling and coordinating care related to the above assessment and plan.  Signed by: Altha Harm, PhD, NP-C

## 2020-11-05 NOTE — Progress Notes (Addendum)
Initial Nutrition Assessment  DOCUMENTATION CODES:  Non-severe (moderate) malnutrition in context of chronic illness  INTERVENTION:   Monitor treatment plan to determine if TPN will be initiated.  If TPN indicated, would recommend adding 100 mg thiamine x 3 days for risk of refeeding due to malnutrition.  Would recommend monitoring Mg and phosphorus x 3 days for refeeding. Assess weight daily for fluid changes.    Pending diet advancement, add supplements. If on clears, start Boost Breeze Orangepo TID, each supplement provides 250 kcal and 9 grams of protein. If advanced beyond clears, recommend Ensure Enlive po TID, each supplement provides 350 kcal and 20 grams of protein  NUTRITION DIAGNOSIS:  Moderate Malnutrition related to chronic illness (cancer) as evidenced by mild fat depletion,mild muscle depletion,severe muscle depletion.  GOAL:  Patient will meet greater than or equal to 90% of their needs  MONITOR:  Diet advancement  REASON FOR ASSESSMENT:  Consult New TPN/TNA  ASSESSMENT:  Pt presented to ED with with vomiting after recently being admitted 4/10-4/14 for a SBO. Symptoms reoccurred 3 nights after dc. Imaging suggests SBO likely due to a malignancy. 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 Pt actively undoing chemo with last treatment 4/6.  Pt resting in bedside chair at the time of assessment, daughter at bedside. Pt currently NPO with NGT in place for decompression. Currently undergoing clamping trial. Surgery consulting. Daughter reports they met with surgery team earlier but we told they are not planning on interventions until they obtain a second opinion. Daughter also reports that surgery team  mentioned TPN to pt as an option to provide nutrition if SBO persisted. Noted that Oncology made mention of venting PEG for pt yesterday. However, pt has large volume ascites which may complicate placement. Pt reports she is hungry and would like to have diet advanced if possible. Obtained supplement preferences (orange boost breeze if on clears, chocolate ensure enlive if advanced further). Pt feels that she has lost weight but is unsure, has not weighed herself recently. Pt reports she is not planning to undergo anymore chemo treatments "any time soon." Will monitor for decisions to be made about treatment plan and make nutrition recommendations to support plan.  Nutritionally Relevant Meds: . pantoprazole (PROTONIX) IV  40 mg Intravenous Q24H   Continuous Infusions: . 0.9 % NaCl with KCl 40 mEq / L 100 mL/hr at 11/05/20 0311   PRN Meds: ondansetron  Labs reviewed:  SBG ranges from 66-106 mg/dL over the last 24 hours  NUTRITION - FOCUSED PHYSICAL EXAM: Flowsheet Row Most Recent Value  Orbital Region Mild depletion  Upper Arm Region No depletion  Thoracic and Lumbar Region Mild depletion  Buccal Region Mild depletion  Temple Region No depletion  Clavicle Bone Region Mild depletion  Scapular Bone Region Mild depletion  Dorsal Hand Mild depletion  Patellar Region Severe depletion  Anterior Thigh Region Severe depletion  Posterior Calf Region Severe depletion  Edema (RD Assessment) None  Hair Unable to assess  [hair loss from chemo]  Eyes Reviewed  Mouth Reviewed  Skin Reviewed  Nails Reviewed     Diet Order:   Diet Order            Diet NPO time specified  Diet effective now  EDUCATION NEEDS:  No education needs have been identified at this time  Skin:  Skin Assessment: Reviewed RN Assessment  Last BM:  4/15 per pt (PTA)  Height:  Ht Readings from Last 1 Encounters:  11/04/20 5' 6"  (1.676 m)   Weight:  Wt Readings from Last 1 Encounters:  11/04/20  54.3 kg    Ideal Body Weight:  59 kg  BMI:  Body mass index is 19.32 kg/m.  Estimated Nutritional Needs:   Kcal:  1700-1900 kcal  Protein:  85-95g  Fluid:  >1.7L/d  Ranell Patrick, RD, LDN Clinical Dietitian Pager on Amion

## 2020-11-05 NOTE — Progress Notes (Signed)
PROGRESS NOTE    Amanda Pena  VOZ:[id number] DOB: Sep 28, 1955 DOA: 11/04/2020 PCP: Cassandria Anger, MD    Brief Narrative:  Amanda Pena is a 65 y.o. 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 Patient was discharged from the hospital 4 days ago after being managed conservatively for small bowel obstruction.She reports that she did well 2 days after her discharge and on the third night she developed severe abdominal pain mostly in the periumbilical area which she rated an 8 x 10 in intensity at its worst associated with nausea and multiple episodes of emesis   Consultants:   Oncology, palliative, surgery  Procedures:   Antimicrobials:       Subjective: Patient has NG tube.  Has multiple questions for the surgeon.  No flatus or bowel movement  Objective: Vitals:   11/04/20 2221 11/04/20 2315 11/05/20 0440 11/05/20 0753  BP: 122/62 (!) 94/53 97/64 108/67  Pulse: 71 65 75 85  Resp: 16 18 18    Temp: 98 F (36.7 C) 98 F (36.7 C) 98 F (36.7 C) 97.8 F (36.6 C)  TempSrc: Oral Oral Oral   SpO2: 100% 99% 100% 100%  Weight: 54.3 kg     Height:        Intake/Output Summary (Last 24 hours) at 11/05/2020 0835 Last data filed at 11/05/2020 0500 Gross per 24 hour  Intake 1154.85 ml  Output 50 ml  Net 1104.85 ml   Filed Weights   11/04/20 0852 11/04/20 2221  Weight: 55 kg 54.3 kg    Examination:  General exam: Appears calm and comfortable  Respiratory system: Clear to auscultation. Respiratory effort normal. Cardiovascular system: S1 & S2 heard,  RRR. No JVD, murmurs, rubs, gallops or clicks.  Gastrointestinal system: Abdomen distended, soft, hypoactive bowel sounds Central nervous system: Alert and oriented. No focal neurological deficits. Extremities: No edema Skin: Warm dry Psychiatry: Judgement and insight appear normal. Mood & affect appropriate.     Data Reviewed: I have personally reviewed following labs and imaging studies  CBC: Recent Labs  Lab 11/04/20 0855 11/05/20 0551  WBC 11.3* 6.7  HGB 13.0 10.0*  HCT 40.0 31.6*  MCV 93.5 95.2  PLT 329 425   Basic Metabolic Panel: Recent Labs  Lab 11/04/20 0855 11/05/20 0551  NA 134* 137  K 3.6 4.7  CL 96* 103  CO2 26 24  GLUCOSE 106* 66*  BUN 21 21  CREATININE 1.03* 0.77  CALCIUM 8.8* 8.0*   GFR: Estimated Creatinine Clearance: 62.5 mL/min (by C-G formula based on SCr of 0.77 mg/dL). Liver Function Tests: Recent Labs  Lab 11/04/20 0855  AST 25  ALT 17  ALKPHOS 82  BILITOT 1.3*  PROT 6.7  ALBUMIN 3.4*   Recent Labs  Lab 11/04/20 0855  LIPASE 43   No results for input(s): AMMONIA in the last 168 hours. Coagulation Profile: No results for input(s): INR, PROTIME in the last 168 hours. Cardiac Enzymes: No results for input(s): CKTOTAL, CKMB, CKMBINDEX, TROPONINI in the last 168 hours. BNP (last 3 results) No results for input(s): PROBNP in the last 8760 hours. HbA1C: No results for input(s): HGBA1C in the last 72 hours. CBG: No results  for input(s): GLUCAP in the last 168 hours. Lipid Profile: No results for input(s): CHOL, HDL, LDLCALC, TRIG, CHOLHDL, LDLDIRECT in the last 72 hours. Thyroid Function Tests: No results for input(s): TSH, T4TOTAL, FREET4, T3FREE, THYROIDAB in the last 72 hours. Anemia Panel: No results for input(s): VITAMINB12, FOLATE, FERRITIN, TIBC, IRON, RETICCTPCT in the last 72 hours. Sepsis Labs: No results for input(s): PROCALCITON, LATICACIDVEN in the last 168 hours.  Recent Results (from the past 240 hour(s))  Resp  Panel by RT-PCR (Flu A&B, Covid) Nasopharyngeal Swab     Status: None   Collection Time: 10/27/20  3:35 PM   Specimen: Nasopharyngeal Swab; Nasopharyngeal(NP) swabs in vial transport medium  Result Value Ref Range Status   SARS Coronavirus 2 by RT PCR NEGATIVE NEGATIVE Final    Comment: (NOTE) SARS-CoV-2 target nucleic acids are NOT DETECTED.  The SARS-CoV-2 RNA is generally detectable in upper respiratory specimens during the acute phase of infection. The lowest concentration of SARS-CoV-2 viral copies this assay can detect is 138 copies/mL. A negative result does not preclude SARS-Cov-2 infection and should not be used as the sole basis for treatment or other patient management decisions. A negative result may occur with  improper specimen collection/handling, submission of specimen other than nasopharyngeal swab, presence of viral mutation(s) within the areas targeted by this assay, and inadequate number of viral copies(<138 copies/mL). A negative result must be combined with clinical observations, patient history, and epidemiological information. The expected result is Negative.  Fact Sheet for Patients:  EntrepreneurPulse.com.au  Fact Sheet for Healthcare Providers:  IncredibleEmployment.be  This test is no t yet approved or cleared by the [place] FDA and  has been authorized for detection and/or diagnosis of SARS-CoV-2 by FDA under an Emergency Use Authorization (EUA). This EUA will remain  in effect (meaning this test can be used) for the duration of the COVID-19 declaration under Section 564(b)(1) of the Act, 21 U.S.C.section 360bbb-3(b)(1), unless the authorization is terminated  or revoked sooner.       Influenza A by PCR NEGATIVE NEGATIVE Final   Influenza B by PCR NEGATIVE NEGATIVE Final    Comment: (NOTE) The Xpert Xpress SARS-CoV-2/FLU/RSV plus assay is intended as an aid in the diagnosis of influenza from Nasopharyngeal  swab specimens and should not be used as a sole basis for treatment. Nasal washings and aspirates are unacceptable for Xpert Xpress SARS-CoV-2/FLU/RSV testing.  Fact Sheet for Patients: EntrepreneurPulse.com.au  Fact Sheet for Healthcare Providers: IncredibleEmployment.be  This test is not yet approved or cleared by the [place] FDA and has been authorized for detection and/or diagnosis of SARS-CoV-2 by FDA under an Emergency Use Authorization (EUA). This EUA will remain in effect (meaning this test can be used) for the duration of the COVID-19 declaration under Section 564(b)(1) of the Act, 21 U.S.C. section 360bbb-3(b)(1), unless the authorization is terminated or revoked.  Performed at [facility], [address] [place], [place] [address]   Resp Panel by RT-PCR (Flu A&B, Covid) Nasopharyngeal Swab     Status: None   Collection Time: 11/04/20  8:53 AM   Specimen: Nasopharyngeal Swab; Nasopharyngeal(NP) swabs in vial transport medium  Result Value Ref Range Status   SARS Coronavirus 2 by RT PCR NEGATIVE NEGATIVE Final    Comment: (NOTE) SARS-CoV-2 target nucleic acids are NOT DETECTED.  The SARS-CoV-2 RNA is generally detectable in upper respiratory specimens during the acute phase of infection. The lowest concentration of SARS-CoV-2 viral copies this assay can detect is 138  copies/mL. A negative result does not preclude SARS-Cov-2 infection and should not be used as the sole basis for treatment or other patient management decisions. A negative result may occur with  improper specimen collection/handling, submission of specimen other than nasopharyngeal swab, presence of viral mutation(s) within the areas targeted by this assay, and inadequate number of viral copies(<138 copies/mL). A negative result must be combined with clinical observations, patient history, and epidemiological information. The expected result is  Negative.  Fact Sheet for Patients:  EntrepreneurPulse.com.au  Fact Sheet for Healthcare Providers:  IncredibleEmployment.be  This test is no t yet approved or cleared by the [place] FDA and  has been authorized for detection and/or diagnosis of SARS-CoV-2 by FDA under an Emergency Use Authorization (EUA). This EUA will remain  in effect (meaning this test can be used) for the duration of the COVID-19 declaration under Section 564(b)(1) of the Act, 21 U.S.C.section 360bbb-3(b)(1), unless the authorization is terminated  or revoked sooner.       Influenza A by PCR NEGATIVE NEGATIVE Final   Influenza B by PCR NEGATIVE NEGATIVE Final    Comment: (NOTE) The Xpert Xpress SARS-CoV-2/FLU/RSV plus assay is intended as an aid in the diagnosis of influenza from Nasopharyngeal swab specimens and should not be used as a sole basis for treatment. Nasal washings and aspirates are unacceptable for Xpert Xpress SARS-CoV-2/FLU/RSV testing.  Fact Sheet for Patients: EntrepreneurPulse.com.au  Fact Sheet for Healthcare Providers: IncredibleEmployment.be  This test is not yet approved or cleared by the [place] FDA and has been authorized for detection and/or diagnosis of SARS-CoV-2 by FDA under an Emergency Use Authorization (EUA). This EUA will remain in effect (meaning this test can be used) for the duration of the COVID-19 declaration under Section 564(b)(1) of the Act, 21 U.S.C. section 360bbb-3(b)(1), unless the authorization is terminated or revoked.  Performed at [facility], [address], [place], [place] [address]          Radiology Studies: CT Abdomen Pelvis W Contrast  Result Date: 11/04/2020 CLINICAL DATA:  65 year old female with suspected bowel obstruction. Emesis. History of ovarian cancer. EXAM: CT ABDOMEN AND PELVIS WITH CONTRAST TECHNIQUE: Multidetector CT imaging of the  abdomen and pelvis was performed using the standard protocol following bolus administration of intravenous contrast. CONTRAST:  41mL OMNIPAQUE IOHEXOL 300 MG/ML  SOLN COMPARISON:  CT the abdomen and pelvis 10/27/2020. FINDINGS: Lower chest: Low-attenuation around the distal esophagus, presumably ascites which has extends cephalad above the esophageal hiatus. Hepatobiliary: 2 hypovascular heterogeneously enhancing lesions are again noted in the liver, largest of which is in the left lobe of the liver predominantly in segment 4B (axial image 18 of series 2) measuring 4.5 x 3.6 x 3.9 cm (axial image 18 of series 2 and coronal image 29 of series 5). In segment 6 of the liver (axial image 18 of series 2) there is a smaller lesion measuring 2.6 x 2.5 x 2.2 cm. No other new hepatic lesions. No intra or extrahepatic biliary ductal dilatation. Diffuse periportal edema. Amorphous material lying dependently in the gallbladder, likely to reflect biliary sludge. Pancreas: No pancreatic mass. No pancreatic ductal dilatation. No pancreatic or peripancreatic fluid collections or inflammatory changes. Spleen: Unremarkable. Adrenals/Urinary Tract: Subcentimeter low-attenuation lesion in the upper pole of the right kidney, too small to characterize, but statistically likely to represent a cyst. Left kidney and bilateral adrenal glands are normal in appearance. No hydroureteronephrosis. Urinary bladder is nearly decompressed, but otherwise unremarkable in appearance. Stomach/Bowel: The appearance  of the stomach is normal. Dilatation of the small proximal to mid small bowel, which measures up to 5.1 cm in diameter, with multiple air-fluid levels. Distal small bowel is completely decompressed. Small volume of stool throughout the colon. Exact transition point in the small bowel is not confidently identified on today's examination. Appendix is not visualized, likely surgically absent. Vascular/Lymphatic: Aortic atherosclerosis, without  evidence of aneurysm or dissection in the abdominal or pelvic vasculature. Multiple prominent but nonenlarged retroperitoneal lymph nodes, many of which are partially calcified. No definite lymphadenopathy otherwise noted in the abdomen or pelvis. Reproductive: Status post total abdominal hysterectomy and bilateral salpingo-oophorectomy. Other: Large volume of ascites. Small centrally low-attenuation peripherally intermediate to high attenuation lesion in the left perirectal region (axial image 74 of series 2) measuring 2.1 x 1.7 cm, stable compared to prior examinations. No pneumoperitoneum. Musculoskeletal: There are no aggressive appearing lytic or blastic lesions noted in the visualized portions of the skeleton. IMPRESSION: 1. Today's examination is very similar to the recent prior study, again demonstrating dilatation of the small bowel indicative of small-bowel obstruction. Transition point is not confidently identified, however, the ileum appears decompressed indicating that the point of obstruction is presumably in the distal jejunum or proximal ileum. 2. Persistent large volume of presumably malignant ascites. Stable small left perirectal lesion which likely represents a peritoneal implant. Hypovascular heterogeneously enhancing metastatic liver lesions appear stable to slightly increased in size compared to the recent prior examination. 3. Diffuse periportal edema in the liver. 4. Aortic atherosclerosis. 5. Additional incidental findings, as above. Electronically Signed   By: Vinnie Langton M.D.   On: 11/04/2020 10:48   DG Abd Portable 1 View  Result Date: 11/04/2020 CLINICAL DATA:  NG-tube placement, vomiting EXAM: PORTABLE ABDOMEN - 1 VIEW COMPARISON:  10/30/2020 abdominal radiograph and 11/04/2020 CT abdomen/pelvis FINDINGS: Enteric tube terminates in the proximal body of the stomach. Mildly dilated central abdominal small bowel loop. Relatively gasless abdomen. No evidence of pneumatosis or  pneumoperitoneum. Excreted contrast seen in the bilateral renal collecting systems. IMPRESSION: 1. Enteric tube terminates in the proximal body of the stomach. 2. Mildly dilated central abdominal small bowel loop, compatible with the partial small bowel obstruction described on CT study from earlier today. Electronically Signed   By: Ilona Sorrel M.D.   On: 11/04/2020 12:13        Scheduled Meds: . Chlorhexidine Gluconate Cloth  6 each Topical Daily  . enoxaparin (LOVENOX) injection  40 mg Subcutaneous Q24H  . lidocaine-prilocaine   Topical Once  . pantoprazole (PROTONIX) IV  40 mg Intravenous Q24H   Continuous Infusions: . 0.9 % NaCl with KCl 40 mEq / L 100 mL/hr at 11/05/20 1194    Assessment & Plan:   Principal Problem:   SBO (small bowel obstruction) ([place]) Active Problems:   Hypothyroidism   Malignant neoplasm of ovary ([place])   Peritoneal carcinomatosis ([place])   Peripheral neuropathy due to chemotherapy ([facility])   Small bowel obstruction Likely from her advanced intra-abdominal metastatic ovarian adenocarcinoma General surgery consulted-NG tube placed IV fluids for hydration TPN consult placed Octreotide help with symptoms of malignant bowel obstruction    Dehydration  Secondary to GI loss from nausea vomiting as well as poor p.o. intake  Continue IV fluids secondary to GI loss from nausea and vomiting as well as poor oral intake  Continue IV fluid hydration  Renal function stable   Anxiety Continue Ativan IV as needed     Hypothyroid Holding levothyroxine since patient has NG  tube placed connected to low suction     Ovarian cancer with mets Peripheral neuropathy secondary to chemotherapy- Hold gabapentin 300 mg for now GYN oncology and Dr. Grayland Ormond will see pt.   DVT prophylaxis: Lovenox Code Status: DNR Family Communication: None at bedside Status is: Inpatient  Remains inpatient appropriate because:IV treatments appropriate due to intensity  of illness or inability to take PO   Dispo: The patient is from: Home              Anticipated d/c is to: Home              Patient currently is not medically stable to d/c.   Difficult to place patient No            LOS: 1 day   Time spent: 35 minutes with more than 50% on [place], MD [facility] Pager [phone]  If 7PM-7AM, please contact night-coverage 11/05/2020, 8:35 AM

## 2020-11-05 NOTE — Consult Note (Signed)
PHARMACY - TOTAL PARENTERAL NUTRITION CONSULT NOTE   Indication: Prolonged ileus  Patient Measurements: Height: 5\' 6"  (167.6 cm) Weight: 54.3 kg (119 lb 11.2 oz) IBW/kg (Calculated) : 59.3 TPN AdjBW (KG): 55 Body mass index is 19.32 kg/m.  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 Patient was recently hospitalized 10/27/20 thru 10/31/20 with SBO and progressive liver metastasis and worsening peritoneal disease. Patient subsequently re-admitted 11/04/20 with recurrent SBO of suspected malignant etiology. Pharmacy has been consulted to initiate TPN for prolonged ileus / SBO.   Glucose / Insulin: Non-diabetic. Glucose 66 on AM BMP. On Decadron 4 mg IV q12h Electrolytes: Within normal limits Renal: Scr < 1 Hepatic: LFTs within normal limits. No transaminitis Intake / Output; MIVF: NS + 40 mEq K/L at 100 mL/hr GI Imaging:   4/18 CT abdomen / pelvis:  --C/w small-bowel obstruction... point of obstruction is presumably in the distal jejunum or proximal ileum. --Large volume of presumably malignant ascites. --Hypovascular heterogeneously enhancing metastatic liver lesions appear stable to slightly increased in size compared to the recent prior examination. --Diffuse periportal edema in the liver.  4/19 Abdominal X-ray: Mild small bowel dilatation in the central abdomen with small air-fluid levels consistent with known obstruction  GI Surgeries / Procedures:  08/2019 at [facility]:   Abdominal hysterectomy  Bilateral salpingo-oophorectomy  Omentectomy  Rectosigmoid colectomy and re-anastamosis  Argon beam laser to diaphragm  Resection of small portion of transverse colon mesentery  Optimal debulking to R0  Central access: Right chest port TPN start  date: 4/20  Nutritional Goals (per RD recommendation on 4/19): kCal: 1700 - 1900 / day, Protein: 85-95 g, Fluid: 1.7 - 1.9L Goal TPN rate is 75 mL/hr (provides 93.6 g of protein and 1832 kcals per day)  Current Nutrition:  NPO  NGT decompression  Plan:  --Tentatively start TPN at 40 mL/hr (~1/2 rate) at 1800 on 4/20  Consult placed after cut-off for ordering TPN on 4/19. Earliest initiation will be 4/20  Decision to proceed with TPN is still pending. Will continue to follow plan --Electrolytes in TPN: Na 74mEq/L, K 6mEq/L, Ca 61mEq/L, Mg 74mEq/L, and Phos 20mmol/L. Cl:Ac 1:1 --Add standard MVI and trace elements to TPN; thiamine 100 mg x 3 days due to risk for re-feeding --Upon initiation of TPN, Initiate Sensitive q8h SSI and adjust as needed; can be discontinued if patient tolerates TPN at goal x 48h without insulin requirement --MIVF changed to D5 1/2 NS at 100 mL/hr until decision regarding TPN has been established --Monitor TPN labs on Mon/Thurs, daily until stable. CMP ordered for tomorrow. Will also check magnesium and phosphorous.   Benita Gutter 11/05/2020,1:03 PM

## 2020-11-05 NOTE — Progress Notes (Signed)
Patient ID: Amanda Pena, female   DOB: March 22, 1956, 65 y.o.   MRN: [id number]     [facility] Day(s): 1.   Post op day(s):  Marland Kitchen   Interval History: Patient seen and examined, no acute events or new complaints overnight. Patient was comfortably sitting in chair. Patient reports feeling much better this morning. She denies abdominal pain. Denies pain radiation. Denies aggravating factors. Alleviating factor as been NGT decompression. Had a small bowel movement but is not passing gas. New abdominal xray shows decreased dilation of the small intestine but still with some air fluid level. I personally evaluated the images.   Vital signs in last 24 hours: [min-max] current  Temp:  [97.5 F (36.4 C)-98.9 F (37.2 C)] 98.9 F (37.2 C) (04/19 1946) Pulse Rate:  [65-93] 71 (04/19 1946) Resp:  [15-18] 16 (04/19 1946) BP: (94-130)/(53-81) 130/81 (04/19 1946) SpO2:  [98 %-100 %] 98 % (04/19 1946) Weight:  [54.3 kg] 54.3 kg (04/18 2221)     Height: 5\' 6"  (167.6 cm) Weight: 54.3 kg BMI (Calculated): 19.33   Physical Exam:  Constitutional: alert, cooperative and no distress  Respiratory: breathing non-labored at rest  Cardiovascular: regular rate and sinus rhythm  Gastrointestinal: soft, non-tender, and mild-distended  Labs:  CBC Latest Ref Rng & Units 11/05/2020 11/04/2020 10/28/2020  WBC 4.0 - 10.5 K/uL 6.7 11.3(H) 6.4  Hemoglobin 12.0 - 15.0 g/dL 10.0(L) 13.0 11.2(L)  Hematocrit 36.0 - 46.0 % 31.6(L) 40.0 34.4(L)  Platelets 150 - 400 K/uL 191 329 228   CMP Latest Ref Rng & Units 11/05/2020 11/04/2020 10/28/2020  Glucose 70 - 99 mg/dL 66(L) 106(H) 116(H)  BUN 8 - 23 mg/dL 21 21 33(H)  Creatinine 0.44 - 1.00 mg/dL 0.77 1.03(H) 0.87  Sodium 135 - 145 mmol/L 137 134(L) 138  Potassium 3.5 - 5.1 mmol/L 4.7 3.6 4.0  Chloride 98 - 111 mmol/L 103 96(L) 102  CO2 22 - 32 mmol/L 24 26 26   Calcium 8.9 - 10.3 mg/dL 8.0(L) 8.8(L) 8.4(L)  Total Protein 6.5 - 8.1 g/dL - 6.7 -  Total  Bilirubin 0.3 - 1.2 mg/dL - 1.3(H) -  Alkaline Phos 38 - 126 U/L - 82 -  AST 15 - 41 U/L - 25 -  ALT 0 - 44 U/L - 17 -    Imaging studies: Abdominal xray with decreased small bowel dilation. Air fluid level. No free air.    Assessment/Plan:  65 y.o. female with recurrent small bowel obstruction, complicated by pertinent comorbidities including advanced metastatic carcinoma of the ovaries, anxiety, depression, dyslipidemia, neuropathy.  Patient today with some symptoms improvement. There is also improvement of imaging. I discuss with patient the option of clamp trial and ice chips but she thinks that ice chips is not enough. I explained the patient that it is to premature to remove the NGT today without a clamp trial even more with the fact that she had an early recurrence.   I had a long discussion with the patient about multiple questions: 1. The use of gastrostomy tube: the patient express that a gastrostomy tube is not helpful if is not going to resolve the obstruction or able to be used for nutrition. I oriented the patient that the gastrostomy tube will be a palliative option for venting gas in the context of persistent bowel obstruction. I also oriented the patient with with ascites it might be complicated and of increased risk to have a gastrostomy tube unless the ascites can be controlled.  2. Use of stents: I oriented the patient that stents can not be placed in the jejunum or ileum where her obstruction is located. I oriented the patient and the daughter that the stent are useful in the esophageal area or the distal large intestine but not in the small bowel.   3. Use of TPN: I did discuss with the patient that TPN can be useful as a temporary measure for nutrition. As per Palliative Service recommendations, TPN is not recommended in advanced metastatic disease.I consider that their expertise in this matter should be considered above my recommendations.   4. Surgical management for  resolution of obstruction: I discussed with the patient that surgical management is not usually the first line of treatment for small bowel obstruction. Even more in her case that it is suspected that the etiology of obstruction due to malignant implants is high in the differential. I discussed that sometimes when a patient is taken to the operation room for surgical management if the disease is too advance, there is no intervention possible to perform and closure without any intervention is done. An even in this open and close intervention the risk of surgery such as bleeding, infection, enterocutaneous fistula, intra abdominal abscess, wound dehiscence with evisceration, recurrent of obstruction among others is possible.   I also strongly consider that if patient wants to pursue aggressive treatment, she will benefit of evaluation at tertiary center where they have clinical trial option, surgical oncologist and other specialties for her care.   After this long discussion, the patient and the daughter were left to think about the conversation. The patient and the daughter expressed that they want to discuss all the alternative including discussion with Medical Oncologist, Gynecology-Oncology and to continue the previous evaluation with Dr. Celine Ahr from the previous admission. All this information was relayed to Photographer.   This encounter was more than 60 minutes most of the time counseling the patient and coordinating plan of care.   Arnold Long, MD

## 2020-11-05 NOTE — Progress Notes (Signed)
Pt refused V/S checked at midnight.

## 2020-11-06 ENCOUNTER — Inpatient Hospital Stay: Payer: Self-pay

## 2020-11-06 DIAGNOSIS — R180 Malignant ascites: Secondary | ICD-10-CM

## 2020-11-06 DIAGNOSIS — C786 Secondary malignant neoplasm of retroperitoneum and peritoneum: Secondary | ICD-10-CM

## 2020-11-06 DIAGNOSIS — Z859 Personal history of malignant neoplasm, unspecified: Secondary | ICD-10-CM

## 2020-11-06 DIAGNOSIS — C569 Malignant neoplasm of unspecified ovary: Secondary | ICD-10-CM

## 2020-11-06 DIAGNOSIS — Z9289 Personal history of other medical treatment: Secondary | ICD-10-CM

## 2020-11-06 DIAGNOSIS — E034 Atrophy of thyroid (acquired): Secondary | ICD-10-CM

## 2020-11-06 DIAGNOSIS — Z7189 Other specified counseling: Secondary | ICD-10-CM

## 2020-11-06 LAB — CBC
HCT: 33.9 % — ABNORMAL LOW (ref 36.0–46.0)
Hemoglobin: 11.1 g/dL — ABNORMAL LOW (ref 12.0–15.0)
MCH: 29.9 pg (ref 26.0–34.0)
MCHC: 32.7 g/dL (ref 30.0–36.0)
MCV: 91.4 fL (ref 80.0–100.0)
Platelets: 204 10*3/uL (ref 150–400)
RBC: 3.71 MIL/uL — ABNORMAL LOW (ref 3.87–5.11)
RDW: 16.1 % — ABNORMAL HIGH (ref 11.5–15.5)
WBC: 8.5 10*3/uL (ref 4.0–10.5)
nRBC: 0 % (ref 0.0–0.2)

## 2020-11-06 LAB — COMPREHENSIVE METABOLIC PANEL
ALT: 13 U/L (ref 0–44)
AST: 16 U/L (ref 15–41)
Albumin: 2.9 g/dL — ABNORMAL LOW (ref 3.5–5.0)
Alkaline Phosphatase: 77 U/L (ref 38–126)
Anion gap: 9 (ref 5–15)
BUN: 15 mg/dL (ref 8–23)
CO2: 24 mmol/L (ref 22–32)
Calcium: 8.3 mg/dL — ABNORMAL LOW (ref 8.9–10.3)
Chloride: 100 mmol/L (ref 98–111)
Creatinine, Ser: 0.61 mg/dL (ref 0.44–1.00)
GFR, Estimated: >60 mL/min (ref >60)
Glucose, Bld: 200 mg/dL — ABNORMAL HIGH (ref 70–99)
Potassium: 4.6 mmol/L (ref 3.5–5.1)
Sodium: 133 mmol/L — ABNORMAL LOW (ref 135–145)
Total Bilirubin: 1.1 mg/dL (ref 0.3–1.2)
Total Protein: 6 g/dL — ABNORMAL LOW (ref 6.5–8.1)

## 2020-11-06 LAB — PHOSPHORUS: Phosphorus: 3.5 mg/dL (ref 2.5–4.6)

## 2020-11-06 LAB — MAGNESIUM: Magnesium: 1.8 mg/dL (ref 1.7–2.4)

## 2020-11-06 MED ORDER — ENSURE ENLIVE PO LIQD
237.0000 mL | Freq: Two times a day (BID) | ORAL | Status: DC
  Administered 2020-11-06 (×2): 237 mL via ORAL

## 2020-11-06 MED ORDER — LEVOTHYROXINE SODIUM 50 MCG PO TABS
75.0000 ug | ORAL_TABLET | Freq: Every day | ORAL | Status: DC
  Administered 2020-11-07: 75 ug via ORAL
  Filled 2020-11-06: qty 2

## 2020-11-06 MED ORDER — MORPHINE SULFATE (CONCENTRATE) 10 MG /0.5 ML PO SOLN: 5.0000 mg | ORAL | 0 refills | Status: DC | PRN

## 2020-11-06 NOTE — TOC Transition Note (Signed)
Transition of Care [facility]) - CM/SW Discharge Note   Patient Details  Name: Elle Vezina MRN: [id number] Date of Birth: August 07, 1955  [facility]) CM/SW Contact:  Candie Chroman, LCSW Phone Number: 11/06/2020, 3:22 PM   Clinical Narrative:  Per team, patient and her family have agreed to home with hospice services. CSW met with patient, husband, and daughter at bedside. Reviewed agency options. No preference. Referral made to Zandra Abts, RN with [facility]. Patient reports no DME needs other than a bedside commode. Per MD, plan for discharge tomorrow. No further concerns. CSW encouraged patient and her family to contact CSW as needed. CSW will continue to follow patient and her family for support and facilitate return home when discharged.    Barriers to Discharge: Continued Medical Work up   Patient Goals and CMS Choice     Choice offered to / list presented to : [facility]  Discharge Placement                       Discharge Plan and Services     Post Acute Care Choice: [facility]                               Social Determinants of Health (SDOH) Interventions     Readmission Risk Interventions No flowsheet data found.

## 2020-11-06 NOTE — Progress Notes (Signed)
PROGRESS NOTE    Amanda Pena  YTK:[id number] DOB: 07/16/1956 DOA: 11/04/2020 PCP: Cassandria Anger, MD    Brief Narrative:  Amanda Pena is a 65 y.o. 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 Patient was discharged from the hospital 4 days ago after being managed conservatively for small bowel obstruction.She reports that she did well 2 days after her discharge and on the third night she developed severe abdominal pain mostly in the periumbilical area which she rated an 8 x 10 in intensity at its worst associated with nausea and multiple episodes of emesis   Consultants:   Oncology, palliative, surgery  Procedures:   Antimicrobials:       Subjective: Patient has NG tube.  She tried to clamp the tube last night but started having more nausea. Has multiple questions for the surgeon.  No flatus or bowel movement.  Daughter at bedside  Objective: Vitals:   11/06/20 0500 11/06/20 0541 11/06/20 0853 11/06/20 1149  BP:  (!) 141/83 135/85 (!) 142/80  Pulse:  62 71 70  Resp:  18    Temp:  98 F (36.7 C) 97.8 F (36.6 C) 98.4 F (36.9 C)  TempSrc:  Oral Oral Oral  SpO2:  98% 100% 100%  Weight: 54.7 kg     Height:        Intake/Output Summary (Last 24 hours) at 11/06/2020 1236 Last data filed at 11/06/2020 1051 Gross per 24 hour  Intake 1868.46 ml  Output 50 ml  Net 1818.46 ml   Filed Weights   11/04/20 0852 11/04/20 2221 11/06/20 0500  Weight: 55 kg 54.3 kg 54.7 kg    Examination:  General exam: Appears calm and comfortable  Respiratory system:  Clear to auscultation. Respiratory effort normal. Cardiovascular system: S1 & S2 heard, RRR. No JVD, murmurs, rubs, gallops or clicks.  Gastrointestinal system: Abdomen distended, soft, hypoactive bowel sounds Central nervous system: Alert and oriented. No focal neurological deficits. Extremities: No edema Skin: Warm dry Psychiatry: Judgement and insight appear normal. Mood & affect appropriate.     Data Reviewed: I have personally reviewed following labs and imaging studies  CBC: Recent Labs  Lab 11/04/20 0855 11/05/20 0551 11/06/20 0535  WBC 11.3* 6.7 8.5  HGB 13.0 10.0* 11.1*  HCT 40.0 31.6* 33.9*  MCV 93.5 95.2 91.4  PLT 329 191 557   Basic Metabolic Panel: Recent Labs  Lab 11/04/20 0855 11/05/20 0551 11/06/20 0535  NA 134* 137 133*  K 3.6 4.7 4.6  CL 96* 103 100  CO2 26 24 24   GLUCOSE 106* 66* 200*  BUN 21 21 15   CREATININE 1.03* 0.77 0.61  CALCIUM 8.8* 8.0* 8.3*  MG  --   --  1.8  PHOS  --   --  3.5   GFR: Estimated Creatinine Clearance: 63 mL/min (by C-G formula based on SCr of 0.61 mg/dL). Liver Function Tests: Recent Labs  Lab 11/04/20 0855 11/06/20 0535  AST 25 16  ALT 17 13  ALKPHOS 82 77  BILITOT 1.3* 1.1  PROT 6.7 6.0*  ALBUMIN 3.4* 2.9*   Recent Labs  Lab 11/04/20 0855  LIPASE 43   No results for input(s):  AMMONIA in the last 168 hours. Coagulation Profile: No results for input(s): INR, PROTIME in the last 168 hours. Cardiac Enzymes: No results for input(s): CKTOTAL, CKMB, CKMBINDEX, TROPONINI in the last 168 hours. BNP (last 3 results) No results for input(s): PROBNP in the last 8760 hours. HbA1C: No results for input(s): HGBA1C in the last 72 hours. CBG: No results for input(s): GLUCAP in the last 168 hours. Lipid Profile: No results for input(s): CHOL, HDL, LDLCALC, TRIG, CHOLHDL, LDLDIRECT in the last 72 hours. Thyroid Function Tests: No results for input(s): TSH, T4TOTAL, FREET4, T3FREE, THYROIDAB in the last 72  hours. Anemia Panel: No results for input(s): VITAMINB12, FOLATE, FERRITIN, TIBC, IRON, RETICCTPCT in the last 72 hours. Sepsis Labs: No results for input(s): PROCALCITON, LATICACIDVEN in the last 168 hours.  Recent Results (from the past 240 hour(s))  Resp Panel by RT-PCR (Flu A&B, Covid) Nasopharyngeal Swab     Status: None   Collection Time: 10/27/20  3:35 PM   Specimen: Nasopharyngeal Swab; Nasopharyngeal(NP) swabs in vial transport medium  Result Value Ref Range Status   SARS Coronavirus 2 by RT PCR NEGATIVE NEGATIVE Final    Comment: (NOTE) SARS-CoV-2 target nucleic acids are NOT DETECTED.  The SARS-CoV-2 RNA is generally detectable in upper respiratory specimens during the acute phase of infection. The lowest concentration of SARS-CoV-2 viral copies this assay can detect is 138 copies/mL. A negative result does not preclude SARS-Cov-2 infection and should not be used as the sole basis for treatment or other patient management decisions. A negative result may occur with  improper specimen collection/handling, submission of specimen other than nasopharyngeal swab, presence of viral mutation(s) within the areas targeted by this assay, and inadequate number of viral copies(<138 copies/mL). A negative result must be combined with clinical observations, patient history, and epidemiological information. The expected result is Negative.  Fact Sheet for Patients:  EntrepreneurPulse.com.au  Fact Sheet for Healthcare Providers:  IncredibleEmployment.be  This test is no t yet approved or cleared by the [place] FDA and  has been authorized for detection and/or diagnosis of SARS-CoV-2 by FDA under an Emergency Use Authorization (EUA). This EUA will remain  in effect (meaning this test can be used) for the duration of the COVID-19 declaration under Section 564(b)(1) of the Act, 21 U.S.C.section 360bbb-3(b)(1), unless the authorization is  terminated  or revoked sooner.       Influenza A by PCR NEGATIVE NEGATIVE Final   Influenza B by PCR NEGATIVE NEGATIVE Final    Comment: (NOTE) The Xpert Xpress SARS-CoV-2/FLU/RSV plus assay is intended as an aid in the diagnosis of influenza from Nasopharyngeal swab specimens and should not be used as a sole basis for treatment. Nasal washings and aspirates are unacceptable for Xpert Xpress SARS-CoV-2/FLU/RSV testing.  Fact Sheet for Patients: EntrepreneurPulse.com.au  Fact Sheet for Healthcare Providers: IncredibleEmployment.be  This test is not yet approved or cleared by the [place] FDA and has been authorized for detection and/or diagnosis of SARS-CoV-2 by FDA under an Emergency Use Authorization (EUA). This EUA will remain in effect (meaning this test can be used) for the duration of the COVID-19 declaration under Section 564(b)(1) of the Act, 21 U.S.C. section 360bbb-3(b)(1), unless the authorization is terminated or revoked.  Performed at [facility], [address] [place], [place] [address]   Resp Panel by RT-PCR (Flu A&B, Covid) Nasopharyngeal Swab     Status: None   Collection Time: 11/04/20  8:53 AM   Specimen: Nasopharyngeal Swab; Nasopharyngeal(NP) swabs in  vial transport medium  Result Value Ref Range Status   SARS Coronavirus 2 by RT PCR NEGATIVE NEGATIVE Final    Comment: (NOTE) SARS-CoV-2 target nucleic acids are NOT DETECTED.  The SARS-CoV-2 RNA is generally detectable in upper respiratory specimens during the acute phase of infection. The lowest concentration of SARS-CoV-2 viral copies this assay can detect is 138 copies/mL. A negative result does not preclude SARS-Cov-2 infection and should not be used as the sole basis for treatment or other patient management decisions. A negative result may occur with  improper specimen collection/handling, submission of specimen other than nasopharyngeal swab,  presence of viral mutation(s) within the areas targeted by this assay, and inadequate number of viral copies(<138 copies/mL). A negative result must be combined with clinical observations, patient history, and epidemiological information. The expected result is Negative.  Fact Sheet for Patients:  EntrepreneurPulse.com.au  Fact Sheet for Healthcare Providers:  IncredibleEmployment.be  This test is no t yet approved or cleared by the [place] FDA and  has been authorized for detection and/or diagnosis of SARS-CoV-2 by FDA under an Emergency Use Authorization (EUA). This EUA will remain  in effect (meaning this test can be used) for the duration of the COVID-19 declaration under Section 564(b)(1) of the Act, 21 U.S.C.section 360bbb-3(b)(1), unless the authorization is terminated  or revoked sooner.       Influenza A by PCR NEGATIVE NEGATIVE Final   Influenza B by PCR NEGATIVE NEGATIVE Final    Comment: (NOTE) The Xpert Xpress SARS-CoV-2/FLU/RSV plus assay is intended as an aid in the diagnosis of influenza from Nasopharyngeal swab specimens and should not be used as a sole basis for treatment. Nasal washings and aspirates are unacceptable for Xpert Xpress SARS-CoV-2/FLU/RSV testing.  Fact Sheet for Patients: EntrepreneurPulse.com.au  Fact Sheet for Healthcare Providers: IncredibleEmployment.be  This test is not yet approved or cleared by the [place] FDA and has been authorized for detection and/or diagnosis of SARS-CoV-2 by FDA under an Emergency Use Authorization (EUA). This EUA will remain in effect (meaning this test can be used) for the duration of the COVID-19 declaration under Section 564(b)(1) of the Act, 21 U.S.C. section 360bbb-3(b)(1), unless the authorization is terminated or revoked.  Performed at [facility], [address] [place], [place] [address]           Radiology Studies: DG Abd 2 Views  Result Date: 11/05/2020 CLINICAL DATA:  Small bowel obstruction. EXAM: ABDOMEN - 2 VIEW COMPARISON:  11/04/2020 FINDINGS: An enteric tube remains in place terminating in the region of the proximal gastric body. There is mild small bowel dilatation in the central abdomen with a few small air-fluid levels. Stool is present throughout nondilated colon and rectum. No acute osseous abnormality is seen. IMPRESSION: Mild small bowel dilatation in the central abdomen with small air-fluid levels consistent with known obstruction. Electronically Signed   By: Logan Bores M.D.   On: 11/05/2020 11:16        Scheduled Meds: . Chlorhexidine Gluconate Cloth  6 each Topical Daily  . dexamethasone  4 mg Intravenous Q12H  . enoxaparin (LOVENOX) injection  40 mg Subcutaneous Q24H  . feeding supplement  237 mL Oral BID BM  . [START ON 11/07/2020] levothyroxine  75 mcg Oral Q0600  . lidocaine-prilocaine   Topical Once  . octreotide  100 mcg Intravenous TID  . pantoprazole (PROTONIX) IV  40 mg Intravenous Q24H   Continuous Infusions: . dextrose 5 % and 0.45% NaCl 100 mL/hr at 11/06/20  1051    Assessment & Plan:   Principal Problem:   SBO (small bowel obstruction) (HCC) Active Problems:   Hypothyroidism   Malignant neoplasm of ovary (HCC)   Peritoneal carcinomatosis ([place])   Peripheral neuropathy due to chemotherapy (HCC)   Small bowel obstruction Likely from her advanced intra-abdominal metastatic ovarian adenocarcinoma General surgery following.  Thedore Mins had good discussion with patient and her daughter at bedside this morning on 4/20.  For now the plan is continue NG tube clamping and allow patient to have liquid diet for comfort.  Waiting for oncology to discuss with patient/family but I do feel strongly she is hospice appropriate Octreotide help with symptoms of malignant bowel obstruction  Dehydration  Secondary to GI loss from nausea vomiting as well  as poor p.o. intake  Continue IV fluids secondary to GI loss from nausea and vomiting as well as poor oral intake   Anxiety Continue Ativan IV as needed   Hypothyroid Holding levothyroxine since patient has NG tube placed connected to low suction   Ovarian cancer with mets Peripheral neuropathy secondary to chemotherapy- Hold gabapentin 300 mg for now GYN oncology and Dr. Grayland Ormond will see pt.   DVT prophylaxis: Lovenox Code Status: DNR Family Communication: Daughter updated at bedside on 4/20 Status is: Inpatient  Remains inpatient appropriate because:IV treatments appropriate due to intensity of illness or inability to take PO   Dispo: The patient is from: Home              Anticipated d/c is to: Home with hospice              Patient currently is not medically stable to d/c.   Difficult to place patient No   Patient has very poor prognosis of less than 63-month      LOS: 2 days   Time spent: 35 minutes with more than 50% on COC    Max Sane, MD [facility] Pager [phone]  If 7PM-7AM, please contact night-coverage 11/06/2020, 12:36 PM

## 2020-11-06 NOTE — Progress Notes (Signed)
Patient NGT placed to suction per patient request due to pain and bloating.  Will continue to monitor

## 2020-11-06 NOTE — Consult Note (Signed)
[place]  [phone] [phone]) [phone]  ID: Amanda Pena OB: Sep 14, 1955  MR#: [id number]  HYQ#:[id number]  Patient Care Team: Cassandria Anger, MD as PCP - General (Internal Medicine) Arvella Nigh, MD as Consulting Physician (Obstetrics and Gynecology) Clent Jacks, RN as Oncology Nurse Navigator  CHIEF COMPLAINT: Progressive stage IV ovarian cancer, now with recurrent malignant SBO.  INTERVAL HISTORY: Patient is a 65 year old female who was in the hospital the second time and has many weeks for intractable nausea vomiting secondary to malignant small bowel obstruction.  She has had minimal p.o. intake and increased abdominal pain.  NG tube has only offered minimal relief.  Patient expressed understanding that there are no surgical options and chemotherapy is no longer recommended.  She plans to go home on hospice tomorrow.  She offers no further complaints.  REVIEW OF SYSTEMS:   Review of Systems  Constitutional: Positive for malaise/fatigue. Negative for fever and weight loss.  Respiratory: Negative.  Negative for cough, hemoptysis and shortness of breath.   Cardiovascular: Negative.  Negative for chest pain and leg swelling.  Gastrointestinal: Positive for abdominal pain, nausea and vomiting.  Genitourinary: Negative.   Musculoskeletal: Negative.  Negative for back pain.  Skin: Negative.  Negative for rash.  Neurological: Positive for weakness. Negative for dizziness, focal weakness and headaches.  Psychiatric/Behavioral: The patient is not nervous/anxious.     As per HPI. Otherwise, a complete review of systems is negative.  PAST MEDICAL HISTORY: Past Medical History:  Diagnosis Date  . Cancer ([place])   . Family history of breast cancer   . Family history of lung cancer   . HYPERLIPIDEMIA 04/09/2008   Qualifier: Diagnosis of  By: Wynona Luna   . HYPOTHYROIDISM 04/09/2008   Qualifier: Diagnosis of  By: Wynona Luna    . INSOMNIA, CHRONIC 08/21/2010   Qualifier: Diagnosis of  By: Wynona Luna   . PERSONAL HISTORY MALIGNANT NEOPLASM THYROID 08/21/2010   Qualifier: Diagnosis of  By: Wynona Luna     PAST SURGICAL HISTORY: Past Surgical History:  Procedure Laterality Date  . IR IMAGING GUIDED PORT INSERTION  07/18/2019  . THYROIDECTOMY, PARTIAL      FAMILY HISTORY: Family History  Problem Relation Age of Onset  . Arthritis Mother   . Hyperlipidemia Mother   . Hyperlipidemia Father   . Diabetes Father   . Hypertension Father   . Heart disease Father 30       CAD  . Lung cancer Father   . Breast cancer Maternal Grandmother        in 35s  . Cancer Maternal Aunt        unk type  . Cancer Maternal Uncle        unk type  . Cancer Paternal Uncle        unk type, d. 56s  . Skin cancer Daughter        basal cell     ADVANCED DIRECTIVES (Y/N):  @ADVDIR @  HEALTH MAINTENANCE: Social History   Tobacco Use  . Smoking status: Never Smoker  . Smokeless tobacco: Never Used  Vaping Use  . Vaping Use: Never used  Substance Use Topics  . Alcohol use: Not Currently  . Drug use: No     Colonoscopy:  PAP:  Bone density:  Lipid panel:  No Known Allergies  Current Facility-Administered Medications  Medication Dose Route Frequency Provider Last Rate Last Admin  . Chlorhexidine Gluconate Cloth  2 % PADS 6 each  6 each Topical Daily Agbata, Tochukwu, MD   6 each at 11/06/20 1400  . dexamethasone (DECADRON) injection 4 mg  4 mg Intravenous Q12H Borders, Kirt Boys, NP   4 mg at 11/06/20 1406  . dextrose 5 %-0.45 % sodium chloride infusion   Intravenous Continuous Nolberto Hanlon, MD 100 mL/hr at 11/06/20 1540 Infusion Verify at 11/06/20 1540  . enoxaparin (LOVENOX) injection 40 mg  40 mg Subcutaneous Q24H Agbata, Tochukwu, MD      . feeding supplement (ENSURE ENLIVE / ENSURE PLUS) liquid 237 mL  237 mL Oral BID BM Edison Simon R, PA-C   237 mL at 11/06/20 1419  . [START ON 11/07/2020]  levothyroxine (SYNTHROID) tablet 75 mcg  75 mcg Oral Q0600 Max Sane, MD      . lidocaine-prilocaine (EMLA) cream   Topical Once Agbata, Tochukwu, MD      . LORazepam (ATIVAN) injection 0.5 mg  0.5 mg Intravenous BID PRN Opyd, Ilene Qua, MD   0.5 mg at 11/06/20 0944  . morphine 2 MG/ML injection 2 mg  2 mg Intravenous Q4H PRN Agbata, Tochukwu, MD      . octreotide (SANDOSTATIN) injection 100 mcg  100 mcg Intravenous TID Borders, Kirt Boys, NP   100 mcg at 11/06/20 1408  . ondansetron (ZOFRAN) tablet 4 mg  4 mg Oral Q6H PRN Agbata, Tochukwu, MD       Or  . ondansetron (ZOFRAN) injection 4 mg  4 mg Intravenous Q6H PRN Agbata, Tochukwu, MD      . pantoprazole (PROTONIX) injection 40 mg  40 mg Intravenous Q24H Agbata, Tochukwu, MD   40 mg at 11/06/20 1412    OBJECTIVE: Vitals:   11/06/20 0853 11/06/20 1149  BP: 135/85 (!) 142/80  Pulse: 71 70  Resp:    Temp: 97.8 F (36.6 C) 98.4 F (36.9 C)  SpO2: 100% 100%     Body mass index is 19.47 kg/m.    ECOG FS:2 - Symptomatic, <50% confined to bed  General: Well-developed, well-nourished, no acute distress. Eyes: Pink conjunctiva, anicteric sclera. HEENT: Normocephalic, moist mucous membranes.  NG tube in place. Lungs: No audible wheezing or coughing. Heart: Regular rate and rhythm. Abdomen: Soft, nontender, no obvious distention. Musculoskeletal: No edema, cyanosis, or clubbing. Neuro: Alert, answering all questions appropriately. Cranial nerves grossly intact. Skin: No rashes or petechiae noted. Psych: Normal affect.   LAB RESULTS:  Lab Results  Component Value Date   NA 133 (L) 11/06/2020   K 4.6 11/06/2020   CL 100 11/06/2020   CO2 24 11/06/2020   GLUCOSE 200 (H) 11/06/2020   BUN 15 11/06/2020   CREATININE 0.61 11/06/2020   CALCIUM 8.3 (L) 11/06/2020   PROT 6.0 (L) 11/06/2020   ALBUMIN 2.9 (L) 11/06/2020   AST 16 11/06/2020   ALT 13 11/06/2020   ALKPHOS 77 11/06/2020   BILITOT 1.1 11/06/2020   GFRNONAA >60 11/06/2020    GFRAA >60 01/29/2020    Lab Results  Component Value Date   WBC 8.5 11/06/2020   NEUTROABS 4.8 10/23/2020   HGB 11.1 (L) 11/06/2020   HCT 33.9 (L) 11/06/2020   MCV 91.4 11/06/2020   PLT 204 11/06/2020     STUDIES: CT Abdomen Pelvis W Contrast  Result Date: 11/04/2020 CLINICAL DATA:  65 year old female with suspected bowel obstruction. Emesis. History of ovarian cancer. EXAM: CT ABDOMEN AND PELVIS WITH CONTRAST TECHNIQUE: Multidetector CT imaging of the abdomen and pelvis was performed using the standard protocol  following bolus administration of intravenous contrast. CONTRAST:  64mL OMNIPAQUE IOHEXOL 300 MG/ML  SOLN COMPARISON:  CT the abdomen and pelvis 10/27/2020. FINDINGS: Lower chest: Low-attenuation around the distal esophagus, presumably ascites which has extends cephalad above the esophageal hiatus. Hepatobiliary: 2 hypovascular heterogeneously enhancing lesions are again noted in the liver, largest of which is in the left lobe of the liver predominantly in segment 4B (axial image 18 of series 2) measuring 4.5 x 3.6 x 3.9 cm (axial image 18 of series 2 and coronal image 29 of series 5). In segment 6 of the liver (axial image 18 of series 2) there is a smaller lesion measuring 2.6 x 2.5 x 2.2 cm. No other new hepatic lesions. No intra or extrahepatic biliary ductal dilatation. Diffuse periportal edema. Amorphous material lying dependently in the gallbladder, likely to reflect biliary sludge. Pancreas: No pancreatic mass. No pancreatic ductal dilatation. No pancreatic or peripancreatic fluid collections or inflammatory changes. Spleen: Unremarkable. Adrenals/Urinary Tract: Subcentimeter low-attenuation lesion in the upper pole of the right kidney, too small to characterize, but statistically likely to represent a cyst. Left kidney and bilateral adrenal glands are normal in appearance. No hydroureteronephrosis. Urinary bladder is nearly decompressed, but otherwise unremarkable in appearance.  Stomach/Bowel: The appearance of the stomach is normal. Dilatation of the small proximal to mid small bowel, which measures up to 5.1 cm in diameter, with multiple air-fluid levels. Distal small bowel is completely decompressed. Small volume of stool throughout the colon. Exact transition point in the small bowel is not confidently identified on today's examination. Appendix is not visualized, likely surgically absent. Vascular/Lymphatic: Aortic atherosclerosis, without evidence of aneurysm or dissection in the abdominal or pelvic vasculature. Multiple prominent but nonenlarged retroperitoneal lymph nodes, many of which are partially calcified. No definite lymphadenopathy otherwise noted in the abdomen or pelvis. Reproductive: Status post total abdominal hysterectomy and bilateral salpingo-oophorectomy. Other: Large volume of ascites. Small centrally low-attenuation peripherally intermediate to high attenuation lesion in the left perirectal region (axial image 74 of series 2) measuring 2.1 x 1.7 cm, stable compared to prior examinations. No pneumoperitoneum. Musculoskeletal: There are no aggressive appearing lytic or blastic lesions noted in the visualized portions of the skeleton. IMPRESSION: 1. Today's examination is very similar to the recent prior study, again demonstrating dilatation of the small bowel indicative of small-bowel obstruction. Transition point is not confidently identified, however, the ileum appears decompressed indicating that the point of obstruction is presumably in the distal jejunum or proximal ileum. 2. Persistent large volume of presumably malignant ascites. Stable small left perirectal lesion which likely represents a peritoneal implant. Hypovascular heterogeneously enhancing metastatic liver lesions appear stable to slightly increased in size compared to the recent prior examination. 3. Diffuse periportal edema in the liver. 4. Aortic atherosclerosis. 5. Additional incidental findings,  as above. Electronically Signed   By: Vinnie Langton M.D.   On: 11/04/2020 10:48   CT ABDOMEN PELVIS W CONTRAST  Result Date: 10/27/2020 CLINICAL DATA:  Abdominal pain vomiting.  History of ovarian cancer. EXAM: CT ABDOMEN AND PELVIS WITH CONTRAST TECHNIQUE: Multidetector CT imaging of the abdomen and pelvis was performed using the standard protocol following bolus administration of intravenous contrast. CONTRAST:  7mL OMNIPAQUE IOHEXOL 300 MG/ML  SOLN COMPARISON:  PET-CT 09/12/2020.  Abdomen/pelvis CT 12/01/2019 FINDINGS: Lower chest: Fluid is identified around the distal esophagus. Hepatobiliary: Periportal edema noted. New 3.5 x 3.4 cm heterogeneous lesion is identified in the left liver near the junction of the medial and lateral segments. There is a  new heterogeneous lesion in the posterior right liver measuring 2.2 cm on image 21/2. Gallbladder unremarkable. No intrahepatic or extrahepatic biliary dilation. Pancreas: No focal mass lesion. No dilatation of the main duct. No intraparenchymal cyst. No peripancreatic edema. Spleen: No splenomegaly. No focal mass lesion. Adrenals/Urinary Tract: No adrenal nodule or mass. 8 mm probable cyst upper pole right kidney is similar to prior. Left kidney unremarkable. No evidence for hydroureter. The urinary bladder appears normal for the degree of distention. Stomach/Bowel: Possible wall thickening at the distal esophagus/esophagogastric junction. Stomach otherwise unremarkable. Duodenum is normally positioned as is the ligament of Treitz. Diffuse small bowel dilatation measures up to 5 cm diameter in the right pelvis (image 57/2). A discrete transition zone is not identified, but distal small bowel and terminal ileum are decompressed. Terminal ileum is visible on coronal image 45/3. Colon is nondilated. Rectal anastomosis evident. Vascular/Lymphatic: There is abdominal aortic atherosclerosis without aneurysm. Calcified retroperitoneal nodes are similar to PET-CT  from 6 weeks ago. Portal vein and superior mesenteric vein are patent. Splenic vein is patent. No pelvic sidewall lymphadenopathy. Reproductive: Uterus surgically absent.  There is no adnexal mass. Other: Large volume intraperitoneal free fluid is similar to 09/12/2020. Small mixed attenuation left perirectal nodule is stable. No discrete measurable peritoneal nodule is evident. Musculoskeletal: No worrisome lytic or sclerotic osseous abnormality. IMPRESSION: 1. Interval development of diffuse small bowel dilatation up to 5 cm diameter with decompressed distal small bowel and terminal ileum. A discrete transition zone is not identified, but findings are compatible with distal small bowel obstruction. 2. Left hepatic lobe lesion is progressive since PET-CT 6 weeks ago, demonstrated to be hypermetabolic and consistent with peritoneal disease. There is a new lesion in the posterior right liver compatible with metastatic deposit. 3. Large volume intraperitoneal free fluid is similar to 09/12/2020. As before, fluid generates mass-effect on the liver capsule. 4. Possible wall thickening at the distal esophagus/esophagogastric junction. Esophagitis would be a consideration although tumor involvement not excluded. 5. Stable small mixed attenuation left perirectal nodule. 6. Aortic Atherosclerosis (ICD10-I70.0). Electronically Signed   By: Misty Stanley M.D.   On: 10/27/2020 13:31   DG Abd 2 Views  Result Date: 11/05/2020 CLINICAL DATA:  Small bowel obstruction. EXAM: ABDOMEN - 2 VIEW COMPARISON:  11/04/2020 FINDINGS: An enteric tube remains in place terminating in the region of the proximal gastric body. There is mild small bowel dilatation in the central abdomen with a few small air-fluid levels. Stool is present throughout nondilated colon and rectum. No acute osseous abnormality is seen. IMPRESSION: Mild small bowel dilatation in the central abdomen with small air-fluid levels consistent with known obstruction.  Electronically Signed   By: Logan Bores M.D.   On: 11/05/2020 11:16   DG Abd 2 Views  Result Date: 10/30/2020 CLINICAL DATA:  Small-bowel obstruction EXAM: ABDOMEN - 2 VIEW COMPARISON:  10/28/2020 FINDINGS: Gastric catheter is noted within the stomach stable in appearance from the prior exam. Scattered large and small bowel gas is seen. Fecal material is noted throughout the colon. No obstructive changes are seen. No free air is noted. IMPRESSION: Gastric catheter within the stomach. No obstructive changes are seen. Electronically Signed   By: Inez Catalina M.D.   On: 10/30/2020 08:42   DG Abd Portable 1 View  Result Date: 11/04/2020 CLINICAL DATA:  NG-tube placement, vomiting EXAM: PORTABLE ABDOMEN - 1 VIEW COMPARISON:  10/30/2020 abdominal radiograph and 11/04/2020 CT abdomen/pelvis FINDINGS: Enteric tube terminates in the proximal body of the stomach. Mildly  dilated central abdominal small bowel loop. Relatively gasless abdomen. No evidence of pneumatosis or pneumoperitoneum. Excreted contrast seen in the bilateral renal collecting systems. IMPRESSION: 1. Enteric tube terminates in the proximal body of the stomach. 2. Mildly dilated central abdominal small bowel loop, compatible with the partial small bowel obstruction described on CT study from earlier today. Electronically Signed   By: Ilona Sorrel M.D.   On: 11/04/2020 12:13   DG Abd Portable 1V  Result Date: 10/28/2020 CLINICAL DATA:  Nasogastric tube placement EXAM: PORTABLE ABDOMEN - 1 VIEW COMPARISON:  10/02/2019 FINDINGS: Nasogastric tube tip overlies the proximal body of the stomach. The visualized abdominal gas pattern is normal. Pelvis excluded from view. IMPRESSION: Nasogastric tube tip overlying the proximal body of the stomach. Electronically Signed   By: Fidela Salisbury MD   On: 10/28/2020 05:58    ASSESSMENT: Progressive stage IV ovarian cancer, now with recurrent malignant SBO.  PLAN:    1.  Progressive stage IV ovarian cancer:  Patient last received chemotherapy with Taxol only on October 23, 2020.  Patient expressed understanding that no further surgical options are available in additional chemotherapy would likely not offer benefit.  No further treatments are planned.  Patient has agreed to go home tomorrow with hospice.  No follow-up is necessary in cancer center. 2.  SBO: Appreciate surgical, gynecology oncology, and IR input.  No procedures or surgery would offer benefit.  Hospice as above.  Plan to d/c NG tube prior to discharge.  Patient is now DNR.  Appreciate consult, will follow.   Lloyd Huger, MD   11/06/2020 7:56 PM

## 2020-11-06 NOTE — Progress Notes (Addendum)
[facility] SUR[facility] PRO[facility]ESS [facility] (cpt [phone])  Hospital Day(s): 2.   Interval History: Patient seen and examined, no acute events or new complaints overnight. Patient reports she is feeling some improvement this morning and his very anxious about getting her NGT out. She denies any abdominal pain, nausea, emesis this morning. She did have NGT clamped yesterday for some time but did have nausea overnight and had it returned to LIS. Her laboratory work up is generally reassuring, aside from hyperglycemia to 200. NGT output has been unmeasured in the last 24 hours. She is NPO but passing flatus.   Review of Systems:  Constitutional: denies fever, chills  HEENT: denies cough or congestion  Respiratory: denies any shortness of breath  Cardiovascular: denies chest pain or palpitations  Gastrointestinal: denies abdominal pain, N/V, or diarrhea/and bowel function as per interval history Genitourinary: denies burning with urination or urinary frequency Musculoskeletal: denies pain, decreased motor or sensation   Vital signs in last 24 hours: [min-max] current  Temp:  [97.5 F (36.4 C)-98.9 F (37.2 C)] 98 F (36.7 C) (04/20 0541) Pulse Rate:  [62-93] 62 (04/20 0541) Resp:  [15-18] 18 (04/20 0541) BP: (112-141)/(76-83) 141/83 (04/20 0541) SpO2:  [98 %-100 %] 98 % (04/20 0541) Weight:  [54.7 kg] 54.7 kg (04/20 0500)     Height: 5\' 6"  (167.6 cm) Weight: 54.7 kg (pt said its does not seem right) BMI (Calculated): 19.47   Intake/Output last 2 shifts:  04/19 0701 - 04/20 0700 In: 1133.4 [I.V.:1133.4] Out: -    Physical Exam:  Constitutional: alert, cooperative and no distress, somewhat frail appearing HENT: normocephalic without obvious abnormality, NGT in place; clamped Eyes: PERRL, EOM's grossly intact and symmetric  Respiratory: breathing non-labored at rest  Cardiovascular: regular rate and sinus rhythm  Gastrointestinal: Soft, non-tender, and non-distended, no  rebound/guarding. Previous laparotomy scars present,.  Musculoskeletal: no edema or wounds, motor and sensation grossly intact, NT    Labs:  CBC Latest Ref Rng & Units 11/06/2020 11/05/2020 11/04/2020  WBC 4.0 - 10.5 K/uL 8.5 6.7 11.3(H)  Hemoglobin 12.0 - 15.0 g/dL 11.1(L) 10.0(L) 13.0  Hematocrit 36.0 - 46.0 % 33.9(L) 31.6(L) 40.0  Platelets 150 - 400 K/uL 204 191 329   CMP Latest Ref Rng & Units 11/06/2020 11/05/2020 11/04/2020  Glucose 70 - 99 mg/dL 200(H) 66(L) 106(H)  BUN 8 - 23 mg/dL 15 21 21   Creatinine 0.44 - 1.00 mg/dL 0.61 0.77 1.03(H)  Sodium 135 - 145 mmol/L 133(L) 137 134(L)  Potassium 3.5 - 5.1 mmol/L 4.6 4.7 3.6  Chloride 98 - 111 mmol/L 100 103 96(L)  CO2 22 - 32 mmol/L 24 24 26   Calcium 8.9 - 10.3 mg/dL 8.3(L) 8.0(L) 8.8(L)  Total Protein 6.5 - 8.1 g/dL 6.0(L) - 6.7  Total Bilirubin 0.3 - 1.2 mg/dL 1.1 - 1.3(H)  Alkaline Phos 38 - 126 U/L 77 - 82  AST 15 - 41 U/L 16 - 25  ALT 0 - 44 U/L 13 - 17     Imaging studies: No new pertinent imaging studies   Assessment/Plan: (ICD-10's: K76.609) 65 y.o. female with recurrent small bowel obstruction which unfortunately is most likely secondary to advancement of her malignant process and peritoneal carcinomatosis.    - This is a rather unfortunate case without a lot of good viable answers. I spent a significant amount of time in the room this morning with the patient and her daughter at bedside. I attempted to illicit their understanding of her disease process, which they seemed  to grasp but understandably "had a hard time accepting." I provided as much education as I could, including pictures, as to why this most likely represents a malignant bowel obstruction, peritoneal carcinomatosis, and is unfortunately likely a terminal event. Again, after much discussion, this was understood. I spent time reviewing potential treatment choices including TPN, venting PEG tube, and possible exploration including the risks, limitations, and  complications to each, including but not limited to need for additional procedures, bowel injury, fistula creation, wound dehiscence, prolonged hospitalization, and further deconditioning. Unfortunately, I think the risks certainly far outweigh the benefits from any of these and ultimately do not treat the underlying disease process/etiology. They seemed to have a good understanding of this. At the conclusion of our discussion, our short term plan is to leave NGT clamped and in place and allow her to have liquid diet for comfort which includes Ensures and a [employer] shake which she is craving. The NGT can then be used intermittent for comfort to control nausea/emesis. Additionally, I think they are more amenable to transition towards comfort measures/hspice. I have updated her medical team, including oncology, of our discussion and they will see her today as well. All of their questions I believe were addressed and answered. We will be available as needed and happy to see her if any issues arise.   Face-to-face time spent with the patient and care providers was 45 minutes, with more than 50% of the time spent counseling, educating, and coordinating care of the patient.   -- Edison Simon, PA-C [facility] 11/06/2020, 8:01 AM [phone] M-F: 7am - 4pm  I spent roughly 15 min with the patient this afternoon, again discussing the limited options and the drawbacks associated with each.  Dr. Theora Gianotti had just been by.  I will reach out to Dr. Theora Gianotti to communicate further with her.  I'm afraid there are no viable solutions to this very challenging clinical scenario.  I saw and evaluated the patient.  I agree with the above documentation, exam, and plan, which I have edited where appropriate. Fredirick Maudlin  1:31 PM

## 2020-11-06 NOTE — Plan of Care (Addendum)
  Problem: Clinical Measurements: Goal: Ability to maintain clinical measurements within normal limits will improve Outcome: Progressing Goal: Respiratory complications will improve Outcome: Progressing Goal: Cardiovascular complication will be avoided Outcome: Progressing   NGT still clamped. No complaints of N&V and pain. Passing gas but no BM this shift.   @0530 -Pt called, pt feeling nauseas and wants her NGT hook back to intermittent suction.

## 2020-11-06 NOTE — Progress Notes (Addendum)
[facility]) Hospital Liaison RN note:  Received request from Dr. Max Sane for hospice service at home after discharge. Chart and patient information under review by [facility] physician and eligibility was approved.  Spoke with daughter, Lilia Pro to initiate education related to hospice philosophy, services and to answer any questions. She verbalized understanding of information given. Per discussion, plan is to discharge home tomorrow.  DME needs discussed. Family requests a BSC to be delivered to home. Address and contact info is correct in chart.  Please send signed and completed DNR home with patient. Please provide prescriptions as needed at discharge to ensure ongoing symptom management.  Please call with any hospice related questions or concerns.  Thank you for the opportunity to participate in this patient's care.  Zandra Abts, RN [facility] Liaison  [phone]

## 2020-11-06 NOTE — Consult Note (Addendum)
Gynecologic Oncology Consult Visit   Referring Provider: Dr. 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   She was admitted for the second time with malignant SBOadmitted with abdominal pain/n/a on 11/04/2020. She currently has a NGT in place.   CT scan A/P 11/04/2020 FINDINGS: Lower chest: Low-attenuation around the distal esophagus, presumably ascites which has extends cephalad above the esophageal hiatus.  Hepatobiliary: 2 hypovascular heterogeneously enhancing lesions are again noted in the liver, largest of which is in the left lobe of the liver predominantly in segment 4B (axial image 18 of series 2) measuring 4.5 x 3.6 x 3.9 cm (axial image 18 of series 2 and coronal image 29 of series 5). In segment 6 of the liver (axial image 18 of series 2) there is a smaller lesion measuring 2.6 x 2.5 x 2.2 cm. No other new hepatic lesions. No intra or extrahepatic biliary ductal dilatation. Diffuse periportal edema. Amorphous material lying dependently in the gallbladder, likely to reflect biliary sludge.  Pancreas: No pancreatic mass. No pancreatic ductal dilatation. No pancreatic or peripancreatic fluid collections or inflammatory changes.  Spleen: Unremarkable.  Adrenals/Urinary Tract: Subcentimeter low-attenuation lesion in the upper pole of the right kidney, too small to characterize, but statistically likely to represent a cyst. Left kidney and bilateral adrenal glands are normal in appearance. No hydroureteronephrosis. Urinary bladder is nearly decompressed, but otherwise unremarkable in appearance.  Stomach/Bowel: The appearance of the stomach is normal. Dilatation of the small proximal to mid small bowel, which measures  up to 5.1 cm in diameter, with multiple air-fluid levels. Distal small bowel is completely decompressed. Small volume of stool throughout the colon. Exact transition point in the small bowel is not confidently identified on today's examination. Appendix is not visualized, likely surgically absent.  Vascular/Lymphatic: Aortic atherosclerosis, without evidence of aneurysm or dissection in the abdominal or pelvic vasculature. Multiple prominent but nonenlarged retroperitoneal lymph nodes, many of which are partially calcified. No definite lymphadenopathy otherwise noted in the abdomen or pelvis.  Reproductive: Status post total abdominal hysterectomy and bilateral salpingo-oophorectomy.  Other: Large volume of ascites. Small centrally low-attenuation peripherally intermediate to high attenuation lesion in the left perirectal region (axial image 74 of series 2) measuring 2.1 x 1.7 cm, stable compared to prior examinations. No pneumoperitoneum.  Musculoskeletal: There are no aggressive appearing lytic or blastic lesions noted in the visualized portions of the skeleton.  IMPRESSION: 1. Today's examination is very similar to the recent prior study, again demonstrating dilatation of the small bowel indicative of small-bowel obstruction. Transition point is not confidently identified, however, the ileum appears decompressed indicating that the point of obstruction is presumably in the distal jejunum or proximal ileum. 2. Persistent large volume of presumably malignant ascites. Stable small left perirectal lesion which likely represents a peritoneal implant. Hypovascular heterogeneously enhancing metastatic liver lesions appear stable to slightly increased in size compared to the recent prior examination. 3. Diffuse periportal edema in the liver. 4. Aortic atherosclerosis. 5. Additional incidental findings, as above.   I was asked to see the patient regarding management options.  Her most recent treatment included bevacizumab on 10/16/2020 and paclitaxel on 10/23/2020.   Gynecologic Oncology History:  Amanda Pena is a pleasant P1 post menopausal female, initially seen in consultation from Dr. Alain Marion for  adnexal masses with carcinomatosis, diagnosed with advanced high grade serous ovarian cancer. Her history is as follows:  In November 2020 she complained of severe lower quadrant pain with abdominal distention/bloating and saw her PCP who ordered CT scan 11/26, which showed heterogeneous soft tissue masses bilateral adnexa, measuring 4.3 x 3.6 cm on left, 4.6 x 2.7 cm on right. Diffuse omental soft tissue caking and diffuse peritoneal thickening are seen throughout the abdomen and pelvis with mild ascites. Findings consistent with peritoneal carcinomatosis.  Additionally, multiple pulmonary nodules in right lower lobe largest measuring 12 mm.   Menopause age 25.  No HRT. Last pap- 07/26/2017- NILM.  She complained of night sweats, intermittent constipation, and chronic back pain at initial visit. She takes multiple supplements.   06/16/19 - CT showed: heterogeneous soft tissue masses bilateral adnexa, measuring 4.3 x 3.6 cm on left, 4.6 x 2.7 cm on right. Diffuse omental soft tissue caking and diffuse peritoneal thickening are seen throughout the abdomen and pelvis with mild ascites. Findings consistent with peritoneal carcinomatosis. Additionally, multiple pulmonary nodules in right lower lobe largest measuring 12 mm.   [id number] on 06/21/19  06/27/2019 CT Chest to complete staging showed multiple bilateral pulmonary nodules suspicious for metastatic disease larges measuring 1.0 cm in RLL. Soft tissue mass adjacent to GE junction suspicious for metastasis, right pleural effusion, right basilar atelectasis, ascites, CAD.   Omental mass was biopsied on 06/27/2019 which showed:  HIGH-GRADE SEROUS CARCINOMA OF GYNECOLOGIC ORIGIN.   In view of concern for lung  metastases, she initiated carbo-taxol chemotherapy on 06/30/2019  Instead of undergoing primary debulking.  She received 3 cycles. After first cycle she experienced bone pain and bone scan was performed which was negative for osseous metastatic disease. Port was placed for chemotherapy for patient preference on 07/18/2019.   08/29/2019- CT Chest/Abdomen/Pelvis for restaging 1. Interval response to therapy, decrease in peritoneal carcinomatosis with decreased tumor volume within the omentum. Previously noted bilateral enlarged ovaries have decreased in size from previous exam. Thickened appendix identified along th eright pericolic gutter has decreased in caliber in the interval.  2. Interval improvement in previously noted right lower lobe pulmonary nodularity which is favored to represent sequelae of inflammatory or infectious process. A few persistent, stable milli metric lung nodules are nonspecific but likely represent a benign process. Attention on follow-up imaging advised.   CA -125 was elevated at time of diagnosis and has been followed:  06/21/2019        628 07/24/19              145 08/14/19            32.2  CEA was 1.2 on 06/21/2019.   CA125 normalized after three cycles of carboplatin/taxol. She had interval debulking surgery at [facility] 2/23 with TAH,BSO, omentectomy, appendectomy, rectosigmoid resection and reanastamosis, argon beam coagulation of diaphragm with no residual disease left behind.  She had an uneventful postoperative course.  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  09/12/2019 She had interval debulking surgery at [facility] with TAH,BSO, omentectomy, appendectomy, rectosigmoid resection and reanastamosis, argon beam coagulation of diaphragm with no residual disease left behind.  She had an uneventful postoperative course.  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    CT A/P 10/02/2019 IMPRESSION: 1. Interval hysterectomy and bilateral oophorectomy,  omentectomy, and rectosigmoid resection for serous ovarian cancer. Small 14 mm fluid density structure in the left pelvis abutting the sigmoid colonic suture line has no surrounding inflammatory change or peripheral enhancement,  and favors postoperative seroma. 2. Resection of previous omental nodularity. No evidence of new or recurrent disease. 3. Previous prominent appendix is not seen, question interval appendectomy. 4. Moderate volume of stool in the left colon and rectum, can be seen with constipation. No bowel obstruction or bowel inflammation. 5. Hepatic steatosis.  After her optimal debulking surgery she had 3 more cycles of carboplatin paclitaxel chemotherapy for a total of 6 treatments. Last chemotherapy given on 11/17/2019. She appeared to have progressive disease based on rising CA125 and possible nodularity on imaging.   CT A/P 12/01/2019 Lungs/Pleura: Right lower lobe nodules demonstrate continued improvement, largest measuring 4 mm on image 106/3. Additional scattered small nodules bilaterally are unchanged. There is a small perifissural nodule along the minor fissure on image 73/3.   Stable postsurgical changes within the anterior abdominal wall. Low-density fluid collection to the left of the rectal anastomosis measures 2.2 cm on image 107/2, previously 1.4 cm. This demonstrates no definite solid components or surrounding inflammation. There is increased peritoneal nodularity superior to the transverse colon and adjacent to the pancreatic tail, most notable on sagittal images 78, 97 and 134 of series 6, suspicious for progressive peritoneal carcinomatosis. Stable mild nodular thickening in the subhepatic space, also best seen on the sagittal images.   IMPRESSION: 1. Increased peritoneal nodularity superior to the transverse colon and adjacent to the pancreatic tail suspicious for progressive peritoneal carcinomatosis. Stable nodular thickening in the subhepatic space. 2. Fluid  collection to the left of the rectal anastomosis has mildly enlarged, although demonstrates no solid components. This is indeterminate. No generalized ascites.  3. No other evidence of metastatic disease within the chest, abdomen or pelvis. 4. Further improvement in previously demonstrated small ground-glass pulmonary opacities within the right lower lobe, likely inflammatory. Additional small pulmonary nodules are unchanged. 5. Large amount of stool throughout the colon consistent with constipation. 6. Aortic Atherosclerosis (ICD10-I70.0).  12/13/19 PET/CT Scan  1. Multiple hypermetabolic nodular and plaque-like foci of hypermetabolism throughout the peritoneal cavity with associated ill-defined regions of soft tissue attenuation on the CT images, compatible with metabolically active peritoneal carcinomatosis, most prominent in the perihepatic space as detailed. 2. No hypermetabolism associated with the small cystic focus at the left lateral margin of the colorectal anastomosis, considered benign. 3. No hypermetabolic extraperitoneal metastatic disease.   She opted for treatment on the GY009 trialand was randomized to Doxil and bevacizumab +/- atezolizumab  6/9/2021cycle #1 on Doxil 40 mg/m2 and bevacizumab 10 mg/kg (D1&15)  02/27/20:C3 Doxil 40 mg/m2 and bevacizumab 10 mg/kg (D1&15)+ Neulasta  CT C/A/P with overall stable disease   03/26/20: C4 Doxil 40 mg/m2 and bevacizumab 10 mg/kg (D1&15)+ Neulasta; CA 125 493  04/23/20: CT c/a/p: CT c/a/p 04/23/20: 1. Barely discernable peritoneal soft tissue implants are similar to prior, with persistent thickening/enhancement of the peritoneum. Small volume abdominopelvic ascites is increased. 2. New mild periportal edema is nonspecific. 3. Stable bilateral subcentimeter pulmonary nodules.   04/23/20: C5 Doxil 40 mg/m2 and bevacizumab 10 mg/kg (D1&15)+ Neulasta; CA 125: 468 05/21/20: C6 Doxil 40 mg/m2 and bevacizumab 10 mg/kg (D1&15)+  Neulasta;CA 125: 352  CT c/a/p 06/18/20: Impression: 1. Large volume ascites with peritoneal thickening concerning for metastatic disease. Confirmation can be obtained with ascitic fluid analysis. 2. Indeterminant enlarging left supraclavicular lymph node, recommend attention on follow-up. 3. New right lower lobe pulmonary nodule. 4. Enlarging retroperitoneal lymphadenopathy.  Recist read of the CT consistent with progression of disease given new pelvic adenopathy, increased ascites, and 5 mm right  lower lobe nodule  CA125 results at [facility]      Lab Results  Component Value Date   CA125 352.6 (H) 06/18/2020   CA125 375.9 (H) 05/21/2020   CA125 468.5 (H) 04/23/2020   05/2020 Chemotherapy was switched to paclitaxel and bevacizumab. She is seeing Dr. Grayland Ormond for her chemotherapy management.    Germline genetic testing negative.  HRD negative for somatic BRCA1/2 mutations. GIS negative.   Problem List:     Patient Active Problem List   Diagnosis Date Noted  . COVID-19 vaccine series declined 05/28/2020  . Peritoneal carcinomatosis ([place]) 12/19/2019  . Genetic testing 11/16/2019  . Family history of breast cancer   . Family history of lung cancer   . Postoperative pain 10/04/2019  . Other constipation 10/04/2019  . Peritoneal carcinoma ([place]) 06/23/2019  . Malignant neoplasm of ovary ([place]) 06/23/2019  . Abdominal pain 06/13/2019  . LLQ abdominal pain 06/13/2019  . Calcific bursitis of shoulder 01/29/2016  . Cough 10/15/2014  . Cervical pain (neck) 10/15/2014  . Situational anxiety 03/07/2014  . Goals of care, counseling/discussion 02/10/2013  . 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 Preventative health care 11/09/2011  . INSOMNIA, CHRONIC 08/21/2010  . PERSONAL HISTORY MALIGNANT NEOPLASM THYROID 08/21/2010  . Hypothyroidism 04/09/2008  . Hyperlipidemia 04/09/2008  . Sinusitis 04/09/2008    Past Medical History: Past Medical History:   Diagnosis Date  . Cancer ([place])   . Family history of breast cancer   . Family history of lung cancer   . HYPERLIPIDEMIA 04/09/2008   Qualifier: Diagnosis of  By: Wynona Luna   . HYPOTHYROIDISM 04/09/2008   Qualifier: Diagnosis of  By: Wynona Luna   . INSOMNIA, CHRONIC 08/21/2010   Qualifier: Diagnosis of  By: Wynona Luna   . PERSONAL HISTORY MALIGNANT NEOPLASM THYROID 08/21/2010   Qualifier: Diagnosis of  By: Wynona Luna     Past Surgical History:      Past Surgical History:  Procedure Laterality Date  . IR IMAGING GUIDED PORT INSERTION  07/18/2019  . THYROIDECTOMY, PARTIAL      Family History:      Family History  Problem Relation Age of Onset  . Arthritis Mother   . Hyperlipidemia Mother   . Hyperlipidemia Father   . Diabetes Father   . Hypertension Father   . Heart disease Father 43       CAD  . Lung cancer Father   . Breast cancer Maternal Grandmother        in 34s  . Cancer Maternal Aunt        unk type  . Cancer Maternal Uncle        unk type  . Cancer Paternal Uncle        unk type, d. 26s  . Skin cancer Daughter        basal cell     Social History: Social History        Socioeconomic History  . Marital status: Married    Spouse name: Not on file  . Number of children: Not on file  . Years of education: Not on file  . Highest education level: Not on file  Occupational History  . Not on file  Tobacco Use  . Smoking status: Never Smoker  . Smokeless tobacco: Never Used  Vaping Use  . Vaping Use: Never used  Substance and Sexual Activity  . Alcohol use: Not Currently  . Drug use: No  .  Sexual activity: Yes  Other Topics Concern  . 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 Moderna Sars-Covid-2 Vaccination 12/08/2019  . Td 10/06/2006   Allergies: No Known Allergies   Objective:  Physical Examination:  BP (!) 142/80 (BP Location: Left Arm)   Pulse 70   Temp 98.4 F (36.9 C) (Oral)   Resp 18   Ht 5' 6"  (1.676 m)   Wt 54.7 kg Comment: pt said its does not seem right  SpO2 100%   BMI 19.47 kg/m     ECOG Performance Status: 3 - Symptomatic but completely ambulatory  GENERAL: thin appearing female in no distress HEENT:  Atraumatic and normocephalic ABDOMEN:  Soft, nontender, slightly distended. Well healed midline surgical incision. No obvious masses  Pelvic: deferred - most recent pelvic chaperoned by RN EGBUS: no lesions Cervix: absent Vagina: no lesions, no discharge or bleeding, vaginal cuff healing Uterus: absent Adnexa: absent BME: no palpable masses Rectovaginal: confirmatory   Radiologic Imaging: As noted per history.  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   Symptomatic ascites.   Recent treatment with bevacizumab 10/16/2020  Medical co-morbidities  complicating care: not significant Plan:       Problem List Items Addressed This Visit            Endocrine    Malignant neoplasm of ovary ([place]) - Primary      I recommended moving toward comfort care measures. Management for malignant SBO include palliative G-tube, NGT, or surgery. I do not think she is a good surgical candidate. In order for the G-tube to be considered she will need to have ascites drainage. My recommendation would be for an indwelling ascites drain. After drain placement the G-tube may be able to be placed. In our experience G-tubes are ideal to palliate symptoms of MBO. I did discuss that G-tubes may become clogged and are not always 100% functional.   Our team has previously published our findings regarding indwelling catheters and found that they appear to  be a safe and effective palliative strategy to manage refractory malignant ascites.  J Pain Symptom Manage . 2009 Sep;38(3):341-9. doi: 10.1016/j.jpainsymman.2008.09.008. Epub 2009 Mar 28. Indwelling catheters for the management of refractory malignant ascites: a systematic literature overview and  retrospective chart review Shirl Harris 1, Kieryn Burtis Alvarez-Tylerjames Hoglund, Doralee Albino, Amy P Abernethy  If IR is unable to perform the G-tube options include discharge with the NGT. Another option is surgical placement but surgery is associated with a higher risk of morbidity and she has had bevacizumab on 10/16/2020. I prefer to have at least a 4 week window after bevacizumab before surgery.   I recommended against TPN or other enteral forms of nutrition, and IV fluids.   We discussed goals of care and I recommended Hospice. Beckey Rutter, NP was present during the evaluation and she will help coordinate with the palliative care team. We discussed her prognosis and expectations. Life expectancy is 2-8 weeks.   A total of 60 minutes were spent providing recommendations form Ms. Slaugh; >50% was  spent in education, counseling and coordination of care for ovarian cancer.   Valorie Mcgrory Gaetana Michaelis, MD Gynecologic Oncology

## 2020-11-06 NOTE — Progress Notes (Signed)
Patient refused 1600 vital signs

## 2020-11-07 ENCOUNTER — Telehealth: Payer: Self-pay | Admitting: *Deleted

## 2020-11-07 ENCOUNTER — Inpatient Hospital Stay: Payer: Self-pay | Admitting: Internal Medicine

## 2020-11-07 ENCOUNTER — Telehealth: Payer: Self-pay | Admitting: Internal Medicine

## 2020-11-07 DIAGNOSIS — K56601 Complete intestinal obstruction, unspecified as to cause: Secondary | ICD-10-CM

## 2020-11-07 DIAGNOSIS — C799 Secondary malignant neoplasm of unspecified site: Secondary | ICD-10-CM

## 2020-11-07 MED ORDER — LANREOTIDE ACETATE 120 MG/0.5ML ~~LOC~~ SOLN
120.0000 mg | Freq: Once | SUBCUTANEOUS | Status: AC
Start: 2020-11-07 — End: 2020-11-07
  Administered 2020-11-07: 120 mg via SUBCUTANEOUS
  Filled 2020-11-07: qty 120

## 2020-11-07 MED ORDER — GLYCOPYRROLATE 1 MG PO TABS
1.0000 mg | ORAL_TABLET | Freq: Three times a day (TID) | ORAL | 2 refills | Status: DC
Start: 2020-11-07 — End: 2020-12-20

## 2020-11-07 MED ORDER — DEXAMETHASONE 0.5 MG/5ML PO SOLN: 4.0000 mg | Freq: Two times a day (BID) | ORAL | 4 refills | Status: DC

## 2020-11-07 MED ORDER — HALOPERIDOL LACTATE 2 MG/ML PO CONC: 1.0000 mg | ORAL | 0 refills | Status: DC | PRN

## 2020-11-07 MED ORDER — METOCLOPRAMIDE HCL 5 MG/5ML PO SOLN: 5.0000 mg | Freq: Three times a day (TID) | ORAL | 2 refills | Status: DC

## 2020-11-07 MED ORDER — FENTANYL 12 MCG/HR TD PT72: 1.0000 | MEDICATED_PATCH | TRANSDERMAL | 0 refills | Status: DC

## 2020-11-07 MED ORDER — HEPARIN SOD (PORK) LOCK FLUSH 100 UNIT/ML IV SOLN
500.0000 [IU] | Freq: Once | INTRAVENOUS | Status: AC
  Administered 2020-11-07: 500 [IU] via INTRAVENOUS
  Filled 2020-11-07: qty 5

## 2020-11-07 NOTE — Telephone Encounter (Signed)
VERBAL ORDER called to [facility] at hospice

## 2020-11-07 NOTE — Telephone Encounter (Signed)
    Will Dr Alain Marion be willing to sign orders as attending? Patient being discharged from hospital today.  Please advise/ Horris Latino [phone]

## 2020-11-07 NOTE — TOC Transition Note (Signed)
Transition of Care [facility]) - CM/SW Discharge Note   Patient Details  Name: Amanda Pena MRN: [id number] Date of Birth: 08-20-1955  [facility] CM/SW Contact:  Candie Chroman, LCSW Phone Number: 11/07/2020, 9:29 AM   Clinical Narrative: Patient has orders to discharge home with hospice today. Zandra Abts, RN with [facility] is aware. Patient does not have insurance. Kieth Brightly said she discussed that patient/family would still have to get her medications as before and daughter told her they have community assistance to help cover costs. No further concerns. CSW signing off.  Final next level of care: Home w Hospice Care Barriers to Discharge: Barriers Resolved   Patient Goals and CMS Choice     Choice offered to / list presented to : [place]  Discharge Placement                Patient to be transferred to facility by: Private vehicle   Patient and family notified of of transfer: 11/07/20  Discharge Plan and Services     Post Acute Care Choice: [facility] Medical Equipment                               Social Determinants of Health (SDOH) Interventions     Readmission Risk Interventions No flowsheet data found.

## 2020-11-07 NOTE — Discharge Instructions (Signed)
Hospice Hospice is a service that provides people who are terminally ill and their families with medical, spiritual, and psychological support. It aims to improve a person's quality of life by keeping the person as comfortable as possible in the final stages of life. Who makes up the hospice care team? The following people make up a hospice care team:  The person receiving care and his or her family.  A nurse and a hospice doctor. Your primary health care provider can also be included.  A social worker or case manager.  A religious or spiritual leader.  Specialists such as: ? A dietitian. ? A mental health counselor. ? Physical and occupational therapists. ? Bereavement coordinator.  Trained volunteers who can help with care.   What services are included in hospice care? Hospice services can vary, depending on the organization. Generally, they include:  Ways to keep you comfortable, such as: ? Providing care in your home or in a home-like setting. ? Providing relief from pain and symptoms. The staff will supply all medicines and equipment to keep you comfortable and alert enough to enjoy the company of friends and family. ? Working with your loved ones to help them meet your needs and provide much of your basic care. This helps you to enjoy their support.  Visits or care from a nurse and doctor. This may include 24-hour on-call services.  Visits from other specialists who offer services, such as: ? Counseling to meet your emotional, spiritual, and social needs as well as those needs of your family. ? Massage therapy. ? Nutrition therapy. ? Physical and occupational therapy. ? Art or music therapy. ? Spiritual care to meet your needs and your family's needs. It may involve:  Helping you and your family understand the dying process.  Helping you say goodbye to your family and friends.  Performing a specific religious ceremony or ritual.  Companionship when you are  alone.  Allowing you and your family to rest. Hospice staff may do light housekeeping, prepare meals, and run errands.  Short-term inpatient care, if something cannot be managed in the home.  Bereavement support for grieving family members. When should hospice care begin? Most people who use hospice are believed to have 6 months or less to live.  Your family and health care providers can help you decide when hospice services should begin.  If you live longer than 6 months but your condition does not improve, your doctor may be able to approve you for continued hospice care.  If your condition improves, you may discontinue the program. What should I consider before selecting a program? Most hospice programs are run by nonprofit, independent organizations. Some are affiliated with hospitals, nursing homes, or home health care agencies. Hospice programs can take place in your home or at a hospice center, hospital, or skilled nursing facility. When choosing a hospice program, ask about the following:  What services are available to me and my loved ones? ? What does it cost? Is it covered by insurance? ? Is the program reviewed and licensed by the state or certified in some other way? ? How will my pain and symptoms be managed? ? Will you provide emotional and spiritual support?  If I choose a hospice center or nursing home, where is the hospice center located? Is it convenient for family and friends? ? If I choose a hospice center or nursing home, will my family and friends be able to visit any time? ? If my circumstances change, can   the services be provided in a different setting, such as in my home or in the hospital?  Who makes up the hospice care team? How are they trained or screened? ? How involved can my loved ones be? ? Whom can my family call with questions? ? How is my health care provider involved? Where can I learn more about hospice? You can learn about existing hospice  programs in your area from your health care providers. The websites of the following organizations have helpful information:  National Hospice and Palliative Care Organization: www.nhpco.org  National Association for Home Care & Hospice: www.nahc.org  Hospice Foundation of America: www.hospicefoundation.org  American Cancer Society: www.cancer.org  eHospice: ehospice.com These agencies also can provide information:  A local agency on aging.  Your local United Way chapter.  Your state's department of health or social services. Summary  Hospice is a service that provides people who are terminally ill and their families with medical, spiritual, and psychological support.  Hospice aims to improve your quality of life by keeping you as comfortable as possible in the final stages of life.  Hospice teams often include a doctor, nurse, social worker, counselor, religious or spiritual leader, dietitian, therapists, and volunteers.  Hospice care generally includes pain management, visits from doctors and nurses, physical and occupational therapy, nutrition therapy, spiritual and emotional counseling, caregiver support, and bereavement support for grieving family members.  Hospice programs can take place in your home or at a hospice center, hospital, or skilled nursing facility. This information is not intended to replace advice given to you by your health care provider. Make sure you discuss any questions you have with your health care provider. Document Revised: 04/09/2020 Document Reviewed: 04/09/2020 Elsevier Patient Education  2021 Elsevier Inc.  

## 2020-11-07 NOTE — Progress Notes (Signed)
Pt discharged home with all belongings in care of daughter via private vehicle. Implanted port heparinized and deaccessed prior to discharge. All discharge instructions reviewed with pt and daughter, with their questions answered. Hospice RN scheduled to meet at pt's home immediately post-discharge.

## 2020-11-07 NOTE — Progress Notes (Signed)
[facility]) Hospital Liaison RN note:  Visited with patient and family at bedside. Discussion about whether to keep or d/c NGT. Patient has decided to have NGT discontinued prior to discharge. Discussed hospice services provided. Questions answered. Patient has an admission visit with the hospice nurse today at [facility]. I will fax her the discharge summary once it is available.  Please send signed and completed DNR home with patient. Please provide prescriptions at discharge as needed to ensure ongoing symptom management.  Please call with any hospice related questions or concerns.  Thank you for the opportunity to participate in this patient's care.  Zandra Abts, RN [place] [phone]

## 2020-11-07 NOTE — Telephone Encounter (Signed)
Call from [place] with Hospice asking if Dr Grayland Ormond will serve as attending for this patient who is being discharged from hospital today. Please advise

## 2020-11-07 NOTE — Discharge Summary (Signed)
[name] [place] NAME: Amanda Pena    MR#:  [id number]  DATE OF BIRTH:  [birth year]  DATE OF ADMISSION:  11/04/2020   ADMITTING PHYSICIAN: Collier Bullock, MD  DATE OF DISCHARGE: 11/07/2020  PRIMARY CARE PHYSICIAN: Plotnikov, [name], MD   ADMISSION DIAGNOSIS:  Ascites, malignant [R18.0] SBO (small bowel obstruction) ([place]) [K56.609] Metastatic malignant neoplasm, unspecified site ([place]) [C79.9] Complete intestinal obstruction, unspecified cause ([place]) [K56.601] DISCHARGE DIAGNOSIS:  Principal Problem:   SBO (small bowel obstruction) ([place]) Active Problems:   Hypothyroidism   Malignant neoplasm of ovary ([place])   Peritoneal carcinomatosis ([place])   Peripheral neuropathy due to chemotherapy [facility])   Hospice care   Complete intestinal obstruction ([facility])   Metastatic malignant neoplasm ([place])  SECONDARY DIAGNOSIS:   Past Medical History:  Diagnosis Date  . Cancer ([place])   . Family history of breast cancer   . Family history of lung cancer   . HYPERLIPIDEMIA 04/09/2008   Qualifier: Diagnosis of  By: Wynona Luna   . HYPOTHYROIDISM 04/09/2008   Qualifier: Diagnosis of  By: Wynona Luna   . INSOMNIA, CHRONIC 08/21/2010   Qualifier: Diagnosis of  By: Wynona Luna   . 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 intake  Anxiety Hypothyroidism Progressive stage IV ovarian cancer Peripheral neuropathy secondary to chemotherapy  Patient and family decided comfort care and wanting to go home with hospice services.  All consultants are on board and agreeable with the same plan.   DISCHARGE CONDITIONS:  Fair CONSULTS OBTAINED:  Treatment Team:  Verlon Au, NP DRUG ALLERGIES:  No Known Allergies DISCHARGE MEDICATIONS:   Allergies as of 11/07/2020   No Known Allergies     Medication List    STOP taking these medications   dexamethasone 4 MG tablet Commonly known as: Decadron Replaced by: dexamethasone 0.5 MG/5ML solution   famotidine 20 MG tablet Commonly known as: PEPCID   Fish Oil 1000 MG Caps   lidocaine-prilocaine cream Commonly known as: EMLA   polyethylene glycol 17 g packet Commonly known as: MIRALAX / GLYCOLAX   Probiotic-10 Chew   vitamin C 1000 MG tablet   Vitamin D3 50 MCG (2000 UT) Tabs   zolpidem 10 MG tablet Commonly known as: AMBIEN     TAKE these medications   20/20 ARTIFICIAL TEARS OP Apply 2 drops to eye daily as needed (dry eyes).   ALPRAZolam 0.5 MG tablet Commonly known as: XANAX Take 1 tablet (0.5 mg total) by mouth 2 (two) times daily as needed for anxiety.   amitriptyline 50 MG tablet Commonly known as: ELAVIL Take 1 tablet (50 mg total) by mouth at bedtime.   dexamethasone 0.5 MG/5ML solution Commonly known as: DECADRON Take 40 mLs (4 mg total) by mouth 2 (two) times daily. Replaces: dexamethasone 4 MG tablet   fentaNYL 12 MCG/HR Commonly known as: [place] 1 patch onto  the skin every 3 (three) days.   glycopyrrolate 1 MG tablet Commonly known as: Robinul Take 1 tablet (1 mg total) by mouth 3 (three) times daily.   haloperidol 2 MG/ML solution Commonly known as: HALDOL Take 0.5-1 mLs (1-2 mg total) by mouth every 4 (four) hours as needed (for nausea or vomiting).   MELATONIN PO Take 5 mg by mouth  daily as needed (sleep).   metoCLOPramide 5 MG/5ML solution Commonly known as: REGLAN Take 5 mLs (5 mg total) by mouth 3 (three) times daily. Increase to 10 mg three times daily if tolerating. Discontinue if abdominal cramping.   morphine CONCENTRATE 10 mg / 0.5 ml concentrated solution Take 0.25 mLs (5 mg total) by mouth every 2 (two) hours as needed for severe pain.   ondansetron 8 MG tablet Commonly known as: ZOFRAN One pill every 8 hours as needed for nausea/vomitting.   Synthroid 75 MCG tablet Generic drug: levothyroxine Take 1 tablet (75 mcg total) by mouth daily before breakfast.      DISCHARGE INSTRUCTIONS:   DIET:  Encourage fluids DISCHARGE CONDITION:  Fair ACTIVITY:  Activity as tolerated OXYGEN:  Home Oxygen: No.  Oxygen Delivery: room air DISCHARGE LOCATION:  home with hospice  If you experience worsening of your admission symptoms, develop shortness of breath, life threatening emergency, suicidal or homicidal thoughts you must seek medical attention immediately by calling 911 or calling your MD immediately  if symptoms less severe.  You Must read complete instructions/literature along with all the possible adverse reactions/side effects for all the Medicines you take and that have been prescribed to you. Take any new Medicines after you have completely understood and accpet all the possible adverse reactions/side effects.   Please note  You were cared for by a hospitalist during your hospital stay. If you have any questions about your discharge medications or the care you received while you were in the hospital after you are discharged, you can call the unit and asked to speak with the hospitalist on call if the hospitalist that took care of you is not available. Once you are discharged, your primary care physician will handle any further medical issues. Please note that NO REFILLS for any discharge medications will be authorized once you are discharged, as it is  imperative that you return to your primary care physician (or establish a relationship with a primary care physician if you do not have one) for your aftercare needs so that they can reassess your need for medications and monitor your lab values.    On the day of Discharge:  VITAL SIGNS:  Blood pressure 117/79, pulse 81, temperature 97.8 F (36.6 C), temperature source Oral, resp. rate 16, height 5\' 6"  (1.676 m), weight 55.5 kg, SpO2 100 %. PHYSICAL EXAMINATION:  GENERAL:  65 y.o.-year-old patient lying in the bed with no acute distress.  General exam: Appears calm and comfortable.  NG tube in place Respiratory system: Clear to auscultation. Respiratory effort normal. Cardiovascular system: S1 & S2 heard, RRR. No JVD, murmurs, rubs, gallops or clicks.  Gastrointestinal system: Abdomen distended, soft, hypoactive bowel sounds Central nervous system: Alert and oriented. No focal neurological deficits. Extremities: No edema Skin: Warm dry Psychiatry: Judgement and insight appear normal. Mood & affect appropriate.  DATA REVIEW:   CBC Recent Labs  Lab 11/06/20 0535  WBC 8.5  HGB 11.1*  HCT 33.9*  PLT 204    Chemistries  Recent Labs  Lab 11/06/20 0535  NA 133*  K 4.6  CL 100  CO2 24  GLUCOSE 200*  BUN 15  CREATININE 0.61  CALCIUM 8.3*  MG 1.8  AST 16  ALT 13  ALKPHOS 77  BILITOT 1.1     Outpatient follow-up   30 Day Unplanned Readmission Risk Score   Flowsheet Row ED to Hosp-Admission (Current) from 11/04/2020 in [facility]  30 Day Unplanned Readmission Risk Score (%) 26.91 Filed at 11/07/2020 1200     This score is the patient's risk of an unplanned readmission within 30 days of being discharged (0 -100%). The score is based on dignosis, age, lab data, medications, orders, and past utilization.   Low:  0-14.9   Medium: 15-21.9   High: 22-29.9   Extreme: 30 and above         Management plans discussed with the patient,  family and they are in agreement.  CODE STATUS: DNR/comfort care  TOTAL TIME TAKING CARE OF THIS PATIENT: 45 minutes.    Max Sane M.D on 11/07/2020 at 12:35 PM  [facility]   CC: Primary care physician; Plotnikov, [name], MD   Note: This dictation was prepared with Dragon dictation along with smaller phrase technology. Any transcriptional errors that result from this process are unintentional.

## 2020-11-08 ENCOUNTER — Telehealth: Payer: Self-pay

## 2020-11-08 NOTE — Telephone Encounter (Signed)
Transition Care Management Unsuccessful Follow-up Telephone Call  Date of discharge and from where:  11/07/2020 from [facility]  Attempts:  1st Attempt  Reason for unsuccessful TCM follow-up call:  Spoke with patient to schedule TCM/hospital follow-up with pcp; patient declined all services.

## 2020-11-08 NOTE — Telephone Encounter (Deleted)
Phoned patient and informed her to continue Lovenox through the weekend and Monday, and to have labs drawn on Monday morning, per Beckey Rutter, NP. Per Ander Purpura, additional lovenox has been called in to pt's pharmacy. Pt made aware and verbalized understanding.

## 2020-11-10 NOTE — Telephone Encounter (Signed)
Ok Thx 

## 2020-11-11 ENCOUNTER — Telehealth: Payer: Self-pay | Admitting: Hospice and Palliative Medicine

## 2020-11-11 ENCOUNTER — Telehealth: Payer: Self-pay | Admitting: Oncology

## 2020-11-11 NOTE — Telephone Encounter (Signed)
I called and spoke with patient's hospice nurse, Magda Kiel.  She saw patient this morning and says that patient is feeling "great".  Husband apparently followed the nurse outside to discuss the prognosis given how well patient is feeling.  I explained that patient is highly likely to decline and that that could happen abruptly given the nature of her malignancy and concern for recurrent MBO.  We discussed some options for symptom management if needed.  I would encourage patient to maximize her quality of life while she is feeling good.

## 2020-11-11 NOTE — Progress Notes (Addendum)
The following Assist/Replace Program for Mvasi from [facility] has been terminated due to patient is in hospice care.  Last DOS: 10/16/2020 Madalyn Rob, CPhT IV Drug Replacement Specialist  [place] Phone: [phone]

## 2020-11-11 NOTE — Telephone Encounter (Signed)
11/11/2020 Pt called and asked that all appts be cxl, as she is no longer doing treatment. She states MD is aware, will check w/ scheduler to make sure. Appts cxl per her request  SRW

## 2020-11-11 NOTE — Telephone Encounter (Signed)
Notified Bonnie w/ MD response../lmb 

## 2020-11-12 ENCOUNTER — Encounter: Payer: Self-pay | Admitting: Oncology

## 2020-11-13 ENCOUNTER — Inpatient Hospital Stay: Payer: Self-pay

## 2020-11-13 ENCOUNTER — Telehealth: Payer: Self-pay

## 2020-11-13 ENCOUNTER — Inpatient Hospital Stay: Payer: Self-pay | Admitting: Oncology

## 2020-11-13 NOTE — Telephone Encounter (Signed)
Nutrition Assessment   Reason for Assessment:   Address question in my chart regarding foods to eat.   ASSESSMENT:  65 year old female with progressive stage IV ovarian cancer, now with recurrent malignant SBO.  Noted recent hospital visit.  Patient no longer candidate for further treatment and is followed by hospice.    Spoke with patient via phone and currently consuming broth, gatorade, applesauce, premier protein shakes, ice cream.  "I am ready for something else."    Notes from hospital admission reviewed   INTERVENTION:  Discussed foods options to lower risk of bowel obstruction.  Will mail handout "Diet to Prevent Bowel Obstruction" from [facility] to patient.  Encouraged chewing foods well, cooking, pureeing foods as much as possible.   Contact information provided to patient   MONITORING, EVALUATION, GOAL: weight trends, intake   Next Visit: no follow-up RD available as needed  Carlisle Torgeson B. Zenia Resides, [place], [place] Registered Dietitian [phone] (mobile)

## 2020-11-19 ENCOUNTER — Other Ambulatory Visit: Payer: Self-pay | Admitting: *Deleted

## 2020-11-19 ENCOUNTER — Telehealth: Payer: Self-pay

## 2020-11-19 MED ORDER — AMITRIPTYLINE HCL 50 MG PO TABS: 50.0000 mg | ORAL_TABLET | Freq: Every day | ORAL | 0 refills | Status: DC

## 2020-11-19 NOTE — Telephone Encounter (Signed)
Nutrition  Patient called RD with questions regarding nutrition.   Answered to patient's satisfaction.  RD available as needed.   Makyna Niehoff B. Zenia Resides, [place], [place] Registered Dietitian [phone] (mobile)

## 2020-11-22 ENCOUNTER — Telehealth: Payer: Self-pay | Admitting: Hospice and Palliative Medicine

## 2020-11-22 ENCOUNTER — Telehealth: Payer: Self-pay | Admitting: Internal Medicine

## 2020-11-22 NOTE — Telephone Encounter (Signed)
I spoke with patient by phone.  She reports that she feels the best that she has in weeks.  She reports feeling "fantastic."  She is looking forward to her visit with Dr. Theora Gianotti next week to discuss further plans.  Patient is not currently being followed by hospice.

## 2020-11-25 ENCOUNTER — Other Ambulatory Visit: Payer: Self-pay | Admitting: Nurse Practitioner

## 2020-11-27 ENCOUNTER — Other Ambulatory Visit: Payer: Self-pay

## 2020-11-27 ENCOUNTER — Ambulatory Visit: Payer: Self-pay

## 2020-11-27 ENCOUNTER — Inpatient Hospital Stay: Payer: Self-pay | Attending: Obstetrics and Gynecology | Admitting: Obstetrics and Gynecology

## 2020-11-27 VITALS — BP 140/73 | HR 75 | Temp 96.0°F | Resp 20 | Wt 111.0 lb

## 2020-11-27 DIAGNOSIS — E8809 Other disorders of plasma-protein metabolism, not elsewhere classified: Secondary | ICD-10-CM | POA: Insufficient documentation

## 2020-11-27 DIAGNOSIS — Z9221 Personal history of antineoplastic chemotherapy: Secondary | ICD-10-CM | POA: Insufficient documentation

## 2020-11-27 DIAGNOSIS — C569 Malignant neoplasm of unspecified ovary: Secondary | ICD-10-CM | POA: Insufficient documentation

## 2020-11-27 DIAGNOSIS — E785 Hyperlipidemia, unspecified: Secondary | ICD-10-CM | POA: Insufficient documentation

## 2020-11-27 DIAGNOSIS — C786 Secondary malignant neoplasm of retroperitoneum and peritoneum: Secondary | ICD-10-CM | POA: Insufficient documentation

## 2020-11-27 DIAGNOSIS — E039 Hypothyroidism, unspecified: Secondary | ICD-10-CM | POA: Insufficient documentation

## 2020-11-27 DIAGNOSIS — Z79899 Other long term (current) drug therapy: Secondary | ICD-10-CM | POA: Insufficient documentation

## 2020-11-27 DIAGNOSIS — Z90722 Acquired absence of ovaries, bilateral: Secondary | ICD-10-CM | POA: Insufficient documentation

## 2020-11-27 DIAGNOSIS — Z90710 Acquired absence of both cervix and uterus: Secondary | ICD-10-CM | POA: Insufficient documentation

## 2020-11-27 MED ORDER — DEXAMETHASONE 0.5 MG/5ML PO SOLN: 4.0000 mg | Freq: Two times a day (BID) | ORAL | 4 refills | Status: AC

## 2020-11-27 MED ORDER — METOCLOPRAMIDE HCL 5 MG/5ML PO SOLN: ORAL | 3 refills | Status: DC

## 2020-11-27 NOTE — Progress Notes (Signed)
Gynecologic Oncology Inteval Visit   Referring Provider: Dr. Alain Marion  Chief Complaint: High grade serous ovarian cancer, advanced stage, presents for follow up after recent hospitalization  Subjective:  Amanda Pena is a 65 y.o. P1 post menopausal female, initially seen in consultation from Dr. Alain Marion for adnexal masses with carcinomatosis, who returns to clinic for evaluation after her recent hospitalization for SBO. She presents today with her daughter, who provides excellent support.   She had been receiving weekly paclitaxel and bevacizumab therapy for platinum-resistant disease.  She received cycle 1 of weekly Taxol on days 1, 8, and 15 along with bevacizumab on days 1 and 15 at [facility].  Her first cycle was 06/19/2020. She saw Dr. Grayland Ormond for cycles #2 07/17/2020; #3 on 08/14/2020; #4 09/17/2020; and #5 10/16/2020 Day#1 and on 10/23/2020 Day #8. Day#15 not given due to admission. The cycle numbers are incorrect in the [facility] records because they did not include Cycle #1 given at [facility].   She was admitted on 10/27/2020 and 11/04/2020 at [facility] for symptomatic pSBO. She lost at least 7 pounds per her report. She responded to conservative management after her first admission but was rapidly readmitted for recurrent symptoms on 11/04/2020. If was unclear if she had gastrointestinal illness for progression, but her imaging was concerning for progression. Her evaluation included imaging and labs. She was managed by the hospitalist and had Medical Oncology, [place], and Gyn Oncology consultations. She declined ascites drain and G-tube placement. She was treated with dexamethasone and metoclopramide.  We had discussions regarding no further therapy and Hospice. She was discharged on 11/07/2020. She is doing fantastic and says she feels the best she has in months. The steroids may be helping her feel so much better. She is walking over 2 miles per day. She is eating solid food with nutritional supplements.  She is very interested in clinical trials and ongoing treatment options.   CA125 trends   CT A/P 11/04/2020  FINDINGS: Lower chest: Low-attenuation around the distal esophagus, presumably ascites which has extends cephalad above the esophageal hiatus.  Hepatobiliary: 2 hypovascular heterogeneously enhancing lesions are again noted in the liver, largest of which is in the left lobe of the liver predominantly in segment 4B (axial image 18 of series 2) measuring 4.5 x 3.6 x 3.9 cm (axial image 18 of series 2 and coronal image 29 of series 5). In segment 6 of the liver (axial image 18 of series 2) there is a smaller lesion measuring 2.6 x 2.5 x 2.2 cm. No other new hepatic lesions. No intra or extrahepatic biliary ductal dilatation. Diffuse periportal edema. Amorphous material lying dependently in the gallbladder, likely to reflect biliary sludge.  Pancreas: No pancreatic mass. No pancreatic ductal dilatation. No pancreatic or peripancreatic fluid collections or inflammatory changes.  Spleen: Unremarkable.  Adrenals/Urinary Tract: Subcentimeter low-attenuation lesion in the upper pole of the right kidney, too small to characterize, but statistically likely to represent a cyst. Left kidney and bilateral adrenal glands are normal in appearance. No hydroureteronephrosis. Urinary bladder is nearly decompressed, but otherwise unremarkable in appearance.  Stomach/Bowel: The appearance of the stomach is normal. Dilatation of the small proximal to mid small bowel, which measures up to 5.1 cm in diameter, with multiple air-fluid levels. Distal small bowel is completely decompressed. Small volume of stool throughout the colon. Exact transition point in the small bowel is not confidently identified on today's examination. Appendix is not visualized, likely surgically absent.  Vascular/Lymphatic: Aortic atherosclerosis, without evidence of aneurysm  or dissection in the abdominal or  pelvic vasculature. Multiple prominent but nonenlarged retroperitoneal lymph nodes, many of which are partially calcified. No definite lymphadenopathy otherwise noted in the abdomen or pelvis.  Reproductive: Status post total abdominal hysterectomy and bilateral salpingo-oophorectomy.  Other: Large volume of ascites. Small centrally low-attenuation peripherally intermediate to high attenuation lesion in the left perirectal region (axial image 74 of series 2) measuring 2.1 x 1.7 cm, stable compared to prior examinations. No pneumoperitoneum.  Musculoskeletal: There are no aggressive appearing lytic or blastic lesions noted in the visualized portions of the skeleton.  IMPRESSION: 1. Today's examination is very similar to the recent prior study, again demonstrating dilatation of the small bowel indicative of small-bowel obstruction. Transition point is not confidently identified, however, the ileum appears decompressed indicating that the point of obstruction is presumably in the distal jejunum or proximal ileum. 2. Persistent large volume of presumably malignant ascites. Stable small left perirectal lesion which likely represents a peritoneal implant. Hypovascular heterogeneously enhancing metastatic liver lesions appear stable to slightly increased in size compared to the recent prior examination. 3. Diffuse periportal edema in the liver. 4. Aortic atherosclerosis. 5. Additional incidental findings, as above.   The comparison was to imaging study from 10/27/2020 and not the pre-paclitaxel regimen CT which was on 06/18/2020. Compared to the 06/18/2020 scan the hepatic masses appear new; increasing ascites; and the perirectal mass appears stable.   Gynecologic Oncology History:  Amanda Pena is a pleasant P1 post menopausal female, initially seen in consultation from Dr. Alain Marion for adnexal masses with carcinomatosis, diagnosed with advanced high grade serous ovarian  cancer. Her history is as follows:  In November 2020 she complained of severe lower quadrant pain with abdominal distention/bloating and saw her PCP who ordered CT scan 11/26, which showed heterogeneous soft tissue masses bilateral adnexa, measuring 4.3 x 3.6 cm on left, 4.6 x 2.7 cm on right. Diffuse omental soft tissue caking and diffuse peritoneal thickening are seen throughout the abdomen and pelvis with mild ascites. Findings consistent with peritoneal carcinomatosis.  Additionally, multiple pulmonary nodules in right lower lobe largest measuring 12 mm.   Menopause age 63.  No HRT. Last pap- 07/26/2017- NILM.  She complained of night sweats, intermittent constipation, and chronic back pain at initial visit. She takes multiple supplements.   06/16/19 - CT showed: heterogeneous soft tissue masses bilateral adnexa, measuring 4.3 x 3.6 cm on left, 4.6 x 2.7 cm on right. Diffuse omental soft tissue caking and diffuse peritoneal thickening are seen throughout the abdomen and pelvis with mild ascites. Findings consistent with peritoneal carcinomatosis. Additionally, multiple pulmonary nodules in right lower lobe largest measuring 12 mm.   [id number] on 06/21/19  06/27/2019 CT Chest to complete staging showed multiple bilateral pulmonary nodules suspicious for metastatic disease larges measuring 1.0 cm in RLL. Soft tissue mass adjacent to GE junction suspicious for metastasis, right pleural effusion, right basilar atelectasis, ascites, CAD.   Omental mass was biopsied on 06/27/2019 which showed:  HIGH-GRADE SEROUS CARCINOMA OF GYNECOLOGIC ORIGIN.   In view of concern for lung metastases, she initiated carbo-taxol chemotherapy on 06/30/2019  Instead of undergoing primary debulking.  She received 3 cycles. After first cycle she experienced bone pain and bone scan was performed which was negative for osseous metastatic disease. Port was placed for chemotherapy for patient preference on 07/18/2019.   08/29/2019- CT  Chest/Abdomen/Pelvis for restaging 1. Interval response to therapy, decrease in peritoneal carcinomatosis with decreased tumor volume within the omentum. Previously noted bilateral  enlarged ovaries have decreased in size from previous exam. Thickened appendix identified along th eright pericolic gutter has decreased in caliber in the interval.  2. Interval improvement in previously noted right lower lobe pulmonary nodularity which is favored to represent sequelae of inflammatory or infectious process. A few persistent, stable milli metric lung nodules are nonspecific but likely represent a benign process. Attention on follow-up imaging advised.   CA -125 was elevated at time of diagnosis and has been followed:  06/21/2019 628 07/24/19  145 08/14/19 32.2  CEA was 1.2 on 06/21/2019.   CA125 normalized after three cycles of carboplatin/taxol. She had interval debulking surgery at [facility] 2/23 with TAH,BSO, omentectomy, appendectomy, rectosigmoid resection and reanastamosis, argon beam coagulation of diaphragm with no residual disease left behind.  She had an uneventful postoperative course.  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  09/12/2019 She had interval debulking surgery at [facility] with TAH,BSO, omentectomy, appendectomy, rectosigmoid resection and reanastamosis, argon beam coagulation of diaphragm with no residual disease left behind.  She had an uneventful postoperative course.  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    CT A/P 10/02/2019 IMPRESSION: 1. Interval hysterectomy and bilateral oophorectomy, omentectomy, and rectosigmoid resection for serous ovarian cancer. Small 14 mm fluid density structure in the left pelvis abutting the sigmoid colonic suture line has no surrounding inflammatory change or peripheral enhancement, and favors postoperative seroma. 2. Resection of previous omental nodularity. No evidence of new or recurrent disease. 3. Previous prominent appendix is  not seen, question interval appendectomy. 4. Moderate volume of stool in the left colon and rectum, can be seen with constipation. No bowel obstruction or bowel inflammation. 5. Hepatic steatosis.  After her optimal debulking surgery she had 3 more cycles of carboplatin paclitaxel chemotherapy for a total of 6 treatments.  Last chemotherapy given on 11/17/2019.  She appeared to have progressive disease based on rising CA125 and possible nodularity on imaging.   CT A/P 12/01/2019 Lungs/Pleura: Right lower lobe nodules demonstrate continued improvement, largest measuring 4 mm on image 106/3. Additional scattered small nodules bilaterally are unchanged. There is a small perifissural nodule along the minor fissure on image 73/3.   Stable postsurgical changes within the anterior abdominal wall. Low-density fluid collection to the left of the rectal anastomosis measures 2.2 cm on image 107/2, previously 1.4 cm. This demonstrates no definite solid components or surrounding inflammation. There is increased peritoneal nodularity superior to the transverse colon and adjacent to the pancreatic tail, most notable on sagittal images 78, 97 and 134 of series 6, suspicious for progressive peritoneal carcinomatosis. Stable mild nodular thickening in the subhepatic space, also best seen on the sagittal images.   IMPRESSION: 1. Increased peritoneal nodularity superior to the transverse colon and adjacent to the pancreatic tail suspicious for progressive peritoneal carcinomatosis. Stable nodular thickening in the subhepatic space. 2. Fluid collection to the left of the rectal anastomosis has mildly enlarged, although demonstrates no solid components. This is indeterminate. No generalized ascites.  3. No other evidence of metastatic disease within the chest, abdomen or pelvis. 4. Further improvement in previously demonstrated small ground-glass pulmonary opacities within the right lower lobe, likely inflammatory.  Additional small pulmonary nodules are unchanged. 5. Large amount of stool throughout the colon consistent with constipation. 6. Aortic Atherosclerosis (ICD10-I70.0).  12/13/19 PET/CT Scan  1. Multiple hypermetabolic nodular and plaque-like foci of hypermetabolism throughout the peritoneal cavity with associated ill-defined regions of soft tissue attenuation on the CT images, compatible with metabolically active peritoneal carcinomatosis,  most prominent in the perihepatic space as detailed. 2. No hypermetabolism associated with the small cystic focus at the left lateral margin of the colorectal anastomosis, considered benign. 3. No hypermetabolic extraperitoneal metastatic disease.   She opted for treatment on the GY009 trial and was randomized to Doxil and bevacizumab +/- atezolizumab  12/27/2019 cycle #1 on Doxil 40 mg/m2 and bevacizumab 10 mg/kg (D1&15)  02/27/20: C3 Doxil 40 mg/m2 and bevacizumab 10 mg/kg (D1&15) + Neulasta  CT C/A/P with overall stable disease    03/26/20: C4 Doxil 40 mg/m2 and bevacizumab 10 mg/kg (D1&15) + Neulasta; CA 125 493  04/23/20: CT c/a/p: CT c/a/p 04/23/20: 1. Barely discernable peritoneal soft tissue implants are similar to prior, with persistent thickening/enhancement of the peritoneum. Small volume abdominopelvic ascites is increased. 2. New mild periportal edema is nonspecific. 3. Stable bilateral subcentimeter pulmonary nodules.   04/23/20: C5 Doxil 40 mg/m2 and bevacizumab 10 mg/kg (D1&15) + Neulasta; CA 125: 468 05/21/20: C6 Doxil 40 mg/m2 and bevacizumab 10 mg/kg (D1&15) + Neulasta; CA 125: 352  CT c/a/p 06/18/20: Impression: 1. Large volume ascites with peritoneal thickening concerning for metastatic disease. Confirmation can be obtained with ascitic fluid analysis. 2. Indeterminant enlarging left supraclavicular lymph node, recommend attention on follow-up. 3. New right lower lobe pulmonary nodule. 4. Enlarging retroperitoneal  lymphadenopathy.  Recist read of the CT consistent with progression of disease given new pelvic adenopathy, increased ascites, and 5 mm right lower lobe nodule  CA125 results at [facility] 04/23/2020 468.5 (H) 05/21/2020 375.9 (H) 06/18/2020 352.6 (H)  We recommended switching chemotherapy to paclitaxel with bevacizumab.    Germline genetic testing negative.  HRD negative for somatic BRCA1/2 mutations. GIS negative.   Problem List: Patient Active Problem List   Diagnosis Date Noted  . Complete intestinal obstruction ([place])   . Metastatic malignant neoplasm ([place])   . Malnutrition of moderate degree 10/28/2020  . Small bowel obstruction due to adhesions ([place]) 10/28/2020  . Hospice care   . SBO (small bowel obstruction) ([place]) 10/27/2020  . 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 COVID-19 vaccine series declined 05/28/2020  . Peritoneal carcinomatosis ([place]) 12/19/2019  . Genetic testing 11/16/2019  . Family history of breast cancer   . Family history of lung cancer   . Postoperative pain 10/04/2019  . Other constipation 10/04/2019  . Peritoneal carcinoma ([place]) 06/23/2019  . Malignant neoplasm of ovary ([place]) 06/23/2019  . Abdominal pain 06/13/2019  . LLQ abdominal pain 06/13/2019  . Calcific bursitis of shoulder 01/29/2016  . Cough 10/15/2014  . Cervical pain (neck) 10/15/2014  . Situational anxiety 03/07/2014  . Goals of care, counseling/discussion 02/10/2013  . 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 Preventative health care 11/09/2011  . INSOMNIA, CHRONIC 08/21/2010  . PERSONAL HISTORY MALIGNANT NEOPLASM THYROID 08/21/2010  . Hypothyroidism 04/09/2008  . Hyperlipidemia 04/09/2008  . Sinusitis 04/09/2008    Past Medical History: Past Medical History:  Diagnosis Date  . Cancer ([facility])   . Family history of breast cancer   . Family history of lung cancer   . HYPERLIPIDEMIA 04/09/2008   Qualifier: Diagnosis of  By: Wynona Luna   .  HYPOTHYROIDISM 04/09/2008   Qualifier: Diagnosis of  By: Wynona Luna   . INSOMNIA, CHRONIC 08/21/2010   Qualifier: Diagnosis of  By: Wynona Luna   . PERSONAL HISTORY MALIGNANT NEOPLASM THYROID 08/21/2010   Qualifier: Diagnosis of  By: Wynona Luna     Past  Surgical History: Past Surgical History:  Procedure Laterality Date  . IR IMAGING GUIDED PORT INSERTION  07/18/2019  . THYROIDECTOMY, PARTIAL      Family History: Family History  Problem Relation Age of Onset  . Arthritis Mother   . Hyperlipidemia Mother   . Hyperlipidemia Father   . Diabetes Father   . Hypertension Father   . Heart disease Father 57       CAD  . Lung cancer Father   . Breast cancer Maternal Grandmother        in 35s  . Cancer Maternal Aunt        unk type  . Cancer Maternal Uncle        unk type  . Cancer Paternal Uncle        unk type, d. 49s  . Skin cancer Daughter        basal cell     Social History: Social History   Socioeconomic History  . Marital status: Married    Spouse name: Not on file  . Number of children: Not on file  . Years of education: Not on file  . Highest education level: Not on file  Occupational History  . Not on file  Tobacco Use  . Smoking status: Never Smoker  . Smokeless tobacco: Never Used  Vaping Use  . Vaping Use: Never used  Substance and Sexual Activity  . Alcohol use: Not Currently  . Drug use: No  . Sexual activity: Yes  Other Topics Concern  . 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 Moderna Sars-Covid-2 Vaccination 12/08/2019  . Td 10/06/2006   Allergies: No Known Allergies  Current Medications: Current Outpatient Medications  Medication Sig Dispense Refill  . ALPRAZolam (XANAX) 0.5 MG tablet Take 1 tablet (0.5 mg total) by mouth 2 (two) times daily as needed for anxiety. 60 tablet 2  . amitriptyline (ELAVIL) 50 MG tablet Take 1 tablet (50 mg total) by mouth at bedtime. 90 tablet 0  . Artificial Tear Solution (20/20 ARTIFICIAL TEARS OP) Apply 2 drops to eye daily as needed (dry eyes).    Marland Kitchen dexamethasone (DECADRON) 0.5 MG/5ML solution Take 40 mLs (4 mg total) by mouth 2 (two) times daily. 500 mL 4  . fentaNYL (DURAGESIC) 12 MCG/HR Place 1 patch onto the skin every 3 (three) days. 10 patch 0  . glycopyrrolate (ROBINUL) 1 MG tablet Take 1 tablet (1 mg total) by mouth 3 (three) times daily. 90 tablet 2  . haloperidol (HALDOL) 2 MG/ML solution Take 0.5-1 mLs (1-2 mg total) by mouth every 4 (four) hours as needed (for nausea or vomiting). 120 mL 0  . MELATONIN PO Take 5 mg by mouth daily as needed (sleep).    . metoCLOPramide (REGLAN) 5 MG/5ML solution TAKE 5 ML THREE TIMES DAILY. INCREASE TO 10 ML IF TOLERATING. DISCONTINUE IF ABDOMINAL CRAMPING. 240 mL 1  . Morphine Sulfate (MORPHINE CONCENTRATE) 10 mg / 0.5 ml concentrated solution Take 0.25 mLs (5 mg total) by mouth every 2 (  two) hours as needed for severe pain. 30 mL 0  . ondansetron (ZOFRAN) 8 MG tablet One pill every 8 hours as needed for nausea/vomitting. 40 tablet 1  . SYNTHROID 75 MCG tablet Take 1 tablet (75 mcg total) by mouth daily before breakfast. 30 tablet 0   No current facility-administered medications for this visit.   Review of Systems General:  She is feeling stronger than ever Skin: no complaints Eyes: no complaints HEENT: no complaints Breasts: no complaints Pulmonary: no complaints Cardiac: no complaints Gastrointestinal: decreased bloating and ascites; constipation but she is  able to have bowel movements Genitourinary/Sexual: no complaints Ob/Gyn: no complaints Musculoskeletal: back pain Hematology: no complaints Skin: concern for bed sores Neurologic/Psych: numbness & tingling associated with pain but not limited ADLs.   Objective:  Physical Examination:  BP 140/73   Pulse 75   Temp (!) 96 F (35.6 C)   Resp 20   Wt 111 lb (50.3 kg)   SpO2 100%   BMI 17.92 kg/m     ECOG Performance Status: 1 - Symptomatic but completely ambulatory GENERAL: Patient is thin but well appearing female in no acute distress HEENT:  Sclera clear. Anicteric NODES:  Negative axillary, supraclavicular, inguinal lymph node survery LUNGS:  Clear to auscultation bilaterally.   HEART:  Regular rate and rhythm.  ABDOMEN:  Firm, nontender, distended with ascites; intra-abdominal nodules palpated which could be stool burden versus disease.    No hernias, incisions well healed.  EXTREMITIES:  No peripheral edema. Atraumatic. No cyanosis SKIN:  Clear with no obvious rashes or skin changes. Sacral decubitus ulcer with skin breakdown <1 cm area. The wound is not confluent but the total area involved is ~4 cm.   NEURO:  Nonfocal. Well oriented.  Appropriate affect.  Pelvic: chaperoned by RN EGBUS: no lesions Cervix: absent Vagina: no lesions, no discharge or bleeding, vaginal cuff healing Uterus: absent Adnexa: absent BME: no palpable masses but limited by stool burden Rectovaginal: confirmatory   Radiologic Imaging: As noted per history.  CBC    Component Value Date/Time   WBC 8.5 11/06/2020 0535   RBC 3.71 (L) 11/06/2020 0535   HGB 11.1 (L) 11/06/2020 0535   HCT 33.9 (L) 11/06/2020 0535   PLT 204 11/06/2020 0535   MCV 91.4 11/06/2020 0535   MCH 29.9 11/06/2020 0535   MCHC 32.7 11/06/2020 0535   RDW 16.1 (H) 11/06/2020 0535   LYMPHSABS 1.2 10/23/2020 0857   MONOABS 0.3 10/23/2020 0857   EOSABS 0.1 10/23/2020 0857   BASOSABS 0.0 10/23/2020 0857   CMP Latest Ref Rng  & Units 11/06/2020 11/05/2020 11/04/2020  Glucose 70 - 99 mg/dL 200(H) 66(L) 106(H)  BUN 8 - 23 mg/dL 15 21 21   Creatinine 0.44 - 1.00 mg/dL 0.61 0.77 1.03(H)  Sodium 135 - 145 mmol/L 133(L) 137 134(L)  Potassium 3.5 - 5.1 mmol/L 4.6 4.7 3.6  Chloride 98 - 111 mmol/L 100 103 96(L)  CO2 22 - 32 mmol/L 24 24 26   Calcium 8.9 - 10.3 mg/dL 8.3(L) 8.0(L) 8.8(L)  Total Protein 6.5 - 8.1 g/dL 6.0(L) - 6.7  Total Bilirubin 0.3 - 1.2 mg/dL 1.1 - 1.3(H)  Alkaline Phos 38 - 126 U/L 77 - 82  AST 15 - 41 U/L 16 - 25  ALT 0 - 44 U/L 13 - 17   11/06/20 PO4  3.5 Mg 1.8 Albumin 2.9    Assessment:  Amanda Pena is a 65 y.o. female diagnosed with high grade serous cancer of ovarian/tubal origin.  Thought initially to have lung metastases and treated with three cycles of neoadjuvant chemotherapy. Subsequent  CT suggested the lung findings may not have been lung mets.  CA125 regressed to normal and CT scan 1/21 showed minimal residual disease.  Underwent interval debulking 2/23 and had residual disease that required laparotomy with TAH/BSO, omentectomy, debulking argon beam coagulation of right diaphragm, appendectomy and rectosigmoid resection and anastamosis.   No residual disease left at the end of surgery. S/p 3 additional cycles of chemotherapy with evidence of rising CA125 and progression with platinum resistant high grade ovarian cancer.  PET CT completed 12/13/19 with evidence of carcinomatosis. 12/27/19 initiated treatment on GY009: Doxil and bevacizumab.  Progressed after cycle #6 followed by 5 cycles paclitaxel and bevacizumab with evidence or radiologic and clinical progression.   pSBO, resolved with conservative management, continues on steroid and Reglan. GI symptoms may in part have been due to gastrointestinal viral illness  Hypoalbuminemia, continue nutritional supplements.   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    Germline MyRisk testing negative for mutations.  Somatic testing with [employer] somatic mutations for  BRCA1/2 negative. HRD - GIS negative.  Medical co-morbidities complicating care: not significant Plan:   Problem List Items Addressed This Visit      Endocrine   Malignant neoplasm of ovary ([place]) - Primary     We had a discussion regarding her goals of care. She is very interested in other treatment options. She may be eligible for the [place] study comparing mirvetuximab with [facility] therapy. This trial is open at [facility] and Dr. Theressa Millard is the PI. I was able to speak with him today and will send her note. He will ask his CRC, Amanda Pena, to reach out to Ms. Amanda Pena. If she is not a candidate for the trial she may be able to be treated with gemcitabine.  Another option in the future may be the Zentalis trial with a WEE1 inhibitor but I do not know how long that will take to open.   I did not repeat labs today as those would be needed to be obtained per [facility] and screening for the study.   She will continue to follow up with Dr. Grayland Ormond for her other oncologic care; port flushes   Wound care instructions provided for the stage 2 decubitus ulcer.   Amanda Au, NP  The patient's diagnosis and alternatives were discussed with her and her accompanying family members.  All questions were answered to their satisfaction.  A total of >40 minutes were spent with the patient/family today; >50% was spent in education, counseling and coordination of care for ovarian cancer. I personally had a face to face interaction and evaluated the patient jointly with the NP, Amanda Pena.  I have reviewed her history and available records and have performed the physical exam as I documented above.  I have discussed care options with the patient and her daughter.  I agree with the above documentation, assessment and plan which was fully formulated by me.  Counseling was completed by me.   Amanda Pena  Gaetana Michaelis, MD

## 2020-11-27 NOTE — Progress Notes (Signed)
Mepilex Border Flex applied to sacral pressure sore. Area appears to be healing and is drainage free. Instructed to leave in place for 7 days or changed earlier if soiled. Dressing can be peeled back for assessment and then resealed. Provided some extra dressings to apply until completely healed.

## 2020-12-02 ENCOUNTER — Telehealth: Payer: Self-pay | Admitting: Nurse Practitioner

## 2020-12-02 NOTE — Telephone Encounter (Signed)
Called patient to follow up regarding monthly dose of lanreotide for MBO. Voicemail left.

## 2020-12-03 ENCOUNTER — Telehealth: Payer: Self-pay | Admitting: Oncology

## 2020-12-03 ENCOUNTER — Telehealth: Payer: Self-pay | Admitting: Nurse Practitioner

## 2020-12-03 DIAGNOSIS — C569 Malignant neoplasm of unspecified ovary: Secondary | ICD-10-CM

## 2020-12-03 NOTE — Telephone Encounter (Signed)
Spoke to patient. Uncertain about continuing lanreotide due to cost. She was not eligible for clinical trial in [place]. She is leaning towards gemcitabine. Will get her appt with Dr. Grayland Ormond to discuss. Will check labs at that time (cbc, cmp, ca 125).

## 2020-12-03 NOTE — Telephone Encounter (Signed)
Left pt VM with 5/25 scheduled appt. Pt informed to arrive @ 9:30 for labs and Md visit. Asked to call to confirm appt received.

## 2020-12-04 NOTE — Progress Notes (Signed)
Nutrition  Received the following email from patient this am.    Good morning this is Amanda Pena.   I have talked to you a couple of times about my diet and food choices and am feeling great.  I had a physical and the doctor felt I was backed up with stool.  I have been having small deposites all through the day.  The doctor suggested stool softners which I tried (didn't) change a thing.  I don't think I am suppose to have prune juice.  Can you give suggestions?   Prune juice would not be recommended with patient's condition.  Encourage patient to increase fluids as able. RD spoke with Dr Grayland Ormond and he recommended to continue stool softner and add miralax as needed to promote bowel movement.   Email response sent back to patient with above recommendations. Encouraged patient to contact clinic with further questions or concerns.   [name] B. Zenia Resides, [place], [place] Registered Dietitian [phone] (mobile)

## 2020-12-05 NOTE — Progress Notes (Signed)
[place]  [phone] [phone]) [phone]  ID: Amanda Pena OB: Nov 16, 1955  MR#: [id number]  JQB#:[id number]  Patient Care Team: Cassandria Anger, MD as PCP - General (Internal Medicine) Arvella Nigh, MD as Consulting Physician (Obstetrics and Gynecology) Clent Jacks, RN as Oncology Nurse Navigator  CHIEF COMPLAINT: Recurrent platinum resistant ovarian cancer.  INTERVAL HISTORY: Patient returns to clinic today for further evaluation and discussion of possibly reinitiating treatment.  Patient enrolled in hospice approximately 1 month ago, but her performance status is changed and she no longer has symptoms of malignant obstruction.  She discontinued hospice and now feels nearly back to her baseline.  She continues to have a chronic peripheral neuropathy that is unchanged.  She otherwise feels well.  She has no other neurologic complaints.  She denies any recent fevers or illnesses. She has a good appetite denies weight loss.  She has no chest pain, shortness of breath, cough, or hemoptysis.  She denies any abdominal pain.  She denies any nausea, vomiting, constipation, or diarrhea.  She has no urinary complaints.  Patient offers no further specific complaints today.  REVIEW OF SYSTEMS:   Review of Systems  Constitutional: Negative.  Negative for fever, malaise/fatigue and weight loss.  Respiratory: Negative.  Negative for cough, hemoptysis and shortness of breath.   Cardiovascular: Negative.  Negative for chest pain and leg swelling.  Gastrointestinal: Negative.  Negative for abdominal pain and nausea.  Genitourinary: Negative.  Negative for dysuria.  Musculoskeletal: Negative.  Negative for back pain.  Skin: Negative.  Negative for rash.  Neurological: Positive for sensory change. Negative for dizziness, focal weakness, weakness and headaches.  Psychiatric/Behavioral: Negative.  The patient is not nervous/anxious.     As per HPI.  Otherwise, a complete review of systems is negative.  PAST MEDICAL HISTORY: Past Medical History:  Diagnosis Date  . Cancer ([place])   . Family history of breast cancer   . Family history of lung cancer   . HYPERLIPIDEMIA 04/09/2008   Qualifier: Diagnosis of  By: Wynona Luna   . HYPOTHYROIDISM 04/09/2008   Qualifier: Diagnosis of  By: Wynona Luna   . INSOMNIA, CHRONIC 08/21/2010   Qualifier: Diagnosis of  By: Wynona Luna   . PERSONAL HISTORY MALIGNANT NEOPLASM THYROID 08/21/2010   Qualifier: Diagnosis of  By: Wynona Luna     PAST SURGICAL HISTORY: Past Surgical History:  Procedure Laterality Date  . IR IMAGING GUIDED PORT INSERTION  07/18/2019  . THYROIDECTOMY, PARTIAL      FAMILY HISTORY: Family History  Problem Relation Age of Onset  . Arthritis Mother   . Hyperlipidemia Mother   . Hyperlipidemia Father   . Diabetes Father   . Hypertension Father   . Heart disease Father 62       CAD  . Lung cancer Father   . Breast cancer Maternal Grandmother        in 5s  . Cancer Maternal Aunt        unk type  . Cancer Maternal Uncle        unk type  . Cancer Paternal Uncle        unk type, d. 42s  . Skin cancer Daughter        basal cell     ADVANCED DIRECTIVES (Y/N):  N  HEALTH MAINTENANCE: Social History   Tobacco Use  . Smoking status: Never Smoker  . Smokeless tobacco: Never Used  Vaping Use  .  Vaping Use: Never used  Substance Use Topics  . Alcohol use: Not Currently  . Drug use: No     Colonoscopy:  PAP:  Bone density:  Lipid panel:  No Known Allergies  Current Outpatient Medications  Medication Sig Dispense Refill  . ALPRAZolam (XANAX) 0.5 MG tablet Take 1 tablet (0.5 mg total) by mouth 2 (two) times daily as needed for anxiety. 60 tablet 2  . Artificial Tear Solution (20/20 ARTIFICIAL TEARS OP) Apply 2 drops to eye daily as needed (dry eyes).    Marland Kitchen dexamethasone (DECADRON) 0.5 MG/5ML solution Take 40 mLs (4 mg total) by mouth 2  (two) times daily. 500 mL 4  . fentaNYL (DURAGESIC) 12 MCG/HR Place 1 patch onto the skin every 3 (three) days. 10 patch 0  . gabapentin (NEURONTIN) 300 MG capsule Take 1 capsule (300 mg total) by mouth 2 (two) times daily. 60 capsule 1  . MELATONIN PO Take 5 mg by mouth daily as needed (sleep).    . metoCLOPramide (REGLAN) 5 MG/5ML solution TAKE 5 ML by mouth THREE TIMES DAILY. INCREASE TO 10 ML IF TOLERATING. DISCONTINUE IF ABDOMINAL CRAMPING. 480 mL 3  . SYNTHROID 75 MCG tablet Take 1 tablet (75 mcg total) by mouth daily before breakfast. 30 tablet 0  . amitriptyline (ELAVIL) 50 MG tablet Take 1 tablet (50 mg total) by mouth at bedtime. (Patient not taking: Reported on 12/11/2020) 90 tablet 0  . glycopyrrolate (ROBINUL) 1 MG tablet Take 1 tablet (1 mg total) by mouth 3 (three) times daily. 90 tablet 2  . haloperidol (HALDOL) 2 MG/ML solution Take 0.5-1 mLs (1-2 mg total) by mouth every 4 (four) hours as needed (for nausea or vomiting). (Patient not taking: Reported on 12/11/2020) 120 mL 0  . Morphine Sulfate (MORPHINE CONCENTRATE) 10 mg / 0.5 ml concentrated solution Take 0.25 mLs (5 mg total) by mouth every 2 (two) hours as needed for severe pain. (Patient not taking: Reported on 12/11/2020) 30 mL 0  . ondansetron (ZOFRAN) 8 MG tablet One pill every 8 hours as needed for nausea/vomitting. (Patient not taking: Reported on 12/11/2020) 40 tablet 1   No current facility-administered medications for this visit.    OBJECTIVE: Vitals:   12/11/20 0949  BP: 122/84  Pulse: 79  Resp: 17  Temp: 99.4 F (37.4 C)  SpO2: 100%     Body mass index is 18.66 kg/m.    ECOG FS:0 - Asymptomatic   General: Well-developed, well-nourished, no acute distress. Eyes: Pink conjunctiva, anicteric sclera. HEENT: Normocephalic, moist mucous membranes. Lungs: No audible wheezing or coughing. Heart: Regular rate and rhythm. Abdomen: Soft, nontender, no obvious distention. Musculoskeletal: No edema, cyanosis, or  clubbing. Neuro: Alert, answering all questions appropriately. Cranial nerves grossly intact. Skin: No rashes or petechiae noted. Psych: Normal affect.  LAB RESULTS:  Lab Results  Component Value Date   NA 130 (L) 12/11/2020   K 3.9 12/11/2020   CL 93 (L) 12/11/2020   CO2 26 12/11/2020   GLUCOSE 118 (H) 12/11/2020   BUN 26 (H) 12/11/2020   CREATININE 0.57 12/11/2020   CALCIUM 8.4 (L) 12/11/2020   PROT 7.2 12/11/2020   ALBUMIN 2.6 (L) 12/11/2020   AST 89 (H) 12/11/2020   ALT 197 (H) 12/11/2020   ALKPHOS 562 (H) 12/11/2020   BILITOT 1.4 (H) 12/11/2020   GFRNONAA >60 12/11/2020   GFRAA >60 01/29/2020    Lab Results  Component Value Date   WBC 9.5 12/11/2020   NEUTROABS 7.6 12/11/2020  HGB 11.4 (L) 12/11/2020   HCT 36.0 12/11/2020   MCV 93.5 12/11/2020   PLT 212 12/11/2020     STUDIES: No results found.  ASSESSMENT:  Recurrent platinum resistant ovarian cancer.  PLAN:   1.  Recurrent platinum resistant ovarian cancer: Patient's CA-125 at [facility] prior to initiating treatment was 352.6.  Patient last received treatment with Taxol and Avastin on October 23, 2020.  Since that time patient's tumor marker has increased to 565.0.  Her most recent imaging with CT scan on November 04, 2020 revealed progression of disease.  Previously patient has been treated with surgery, carboplatinum and Taxol, liposomal doxorubicin with Avastin plus or minus atezolizumab on clinical trial GY009, and most recently Taxol plus Avastin.  Patient is no longer on hospice care and is considering reinitiating treatment with single agent gemcitabine.  Plan to give treatment on days 1 and 8 with day 15 off over 21-day cycle.  Will get a PET scan prior to initiating treatment.  Patient states she does not wish to restart treatment immediately, therefore will return to clinic in 4 weeks for further evaluation and discussion of reinitiating treatment. 2.  Neuropathy: Chronic and unchanged.  Continue  gabapentin as prescribed.  Acupuncture offered no help.  Appreciate neuro oncology input. 3.  Anemia: Chronic and unchanged.  Patient's hemoglobin is 11.4 today. 4.  Insomnia: Chronic and unchanged.  Continue Ambien as needed. 5.  Abdominal bloating: Patient does not complain of this today.  Paracentesis on September 20, 2020 removed 2.6 L of fluid. 6.  Hyponatremia: Patient's sodium levels have trended down to 130, monitor.  Patient expressed understanding and was in agreement with this plan. She also understands that She can call clinic at any time with any questions, concerns, or complaints.   Cancer Staging Malignant neoplasm of ovary ([place]) Staging form: Ovary, Fallopian Tube, and Primary Peritoneal Carcinoma, AJCC 8th Edition - Clinical: FIGO Stage IVA (pM1a) - Signed by Lloyd Huger, MD on 07/17/2020   Lloyd Huger, MD   12/12/2020 8:40 AM

## 2020-12-08 IMAGING — CT CT BIOPSY
1 of 3 series · 13 of 32 positions shown, 19 images · non-contrast
Comparison: none

INDICATION: Omental mass.

[Series 7: i-spiral 5.0 b30f · axial · 0.66mm/px · z∈[-394,-279]mm · 13 of 39 slices shown, 19 images]
[im 3/39  soft-tissue]
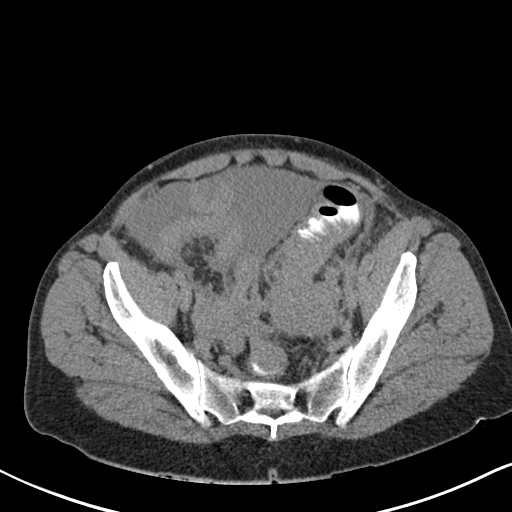
[im 3/39  bone]
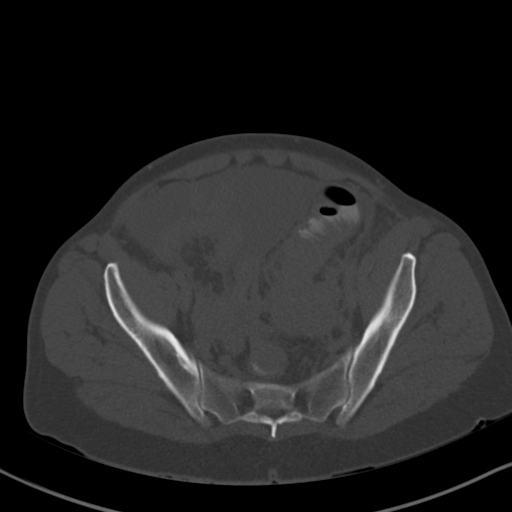
[im 6/39  soft-tissue]
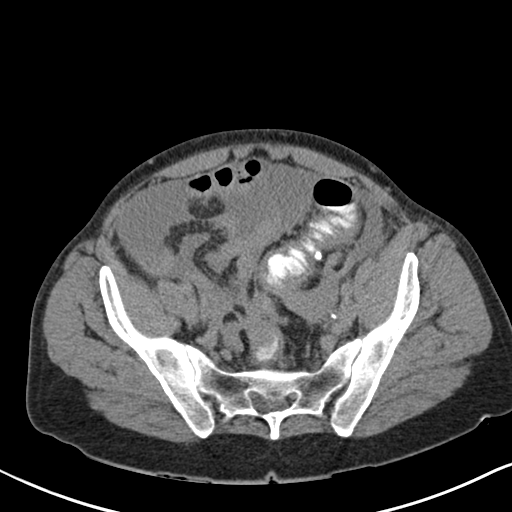
[im 8/39  soft-tissue]
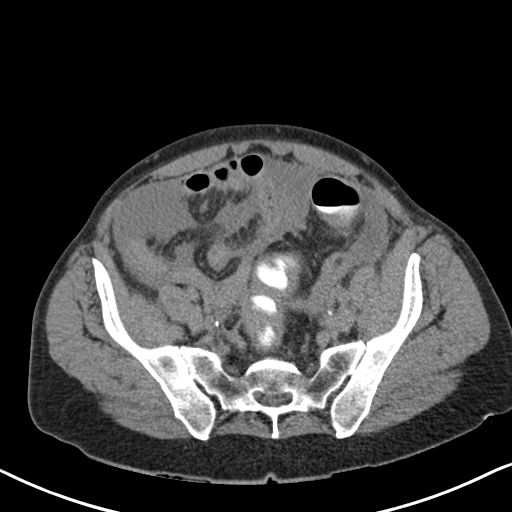
[im 11/39  soft-tissue]
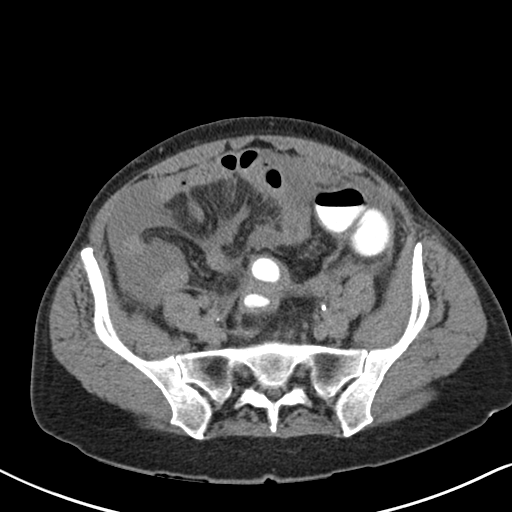
[im 13/39  soft-tissue]
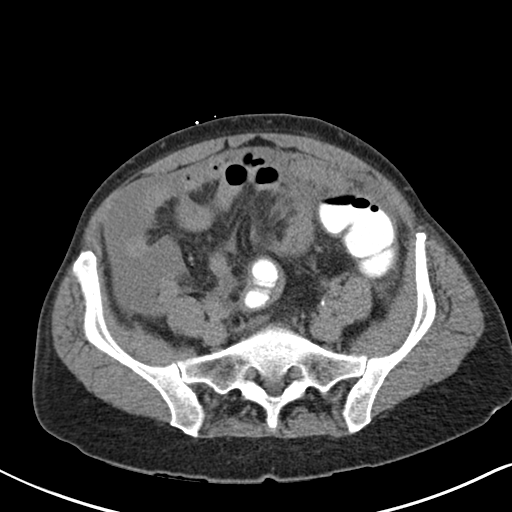
[im 16/39  soft-tissue]
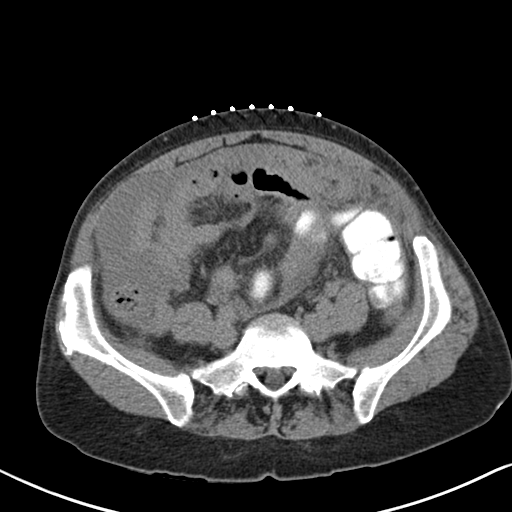
[im 21/39  soft-tissue]
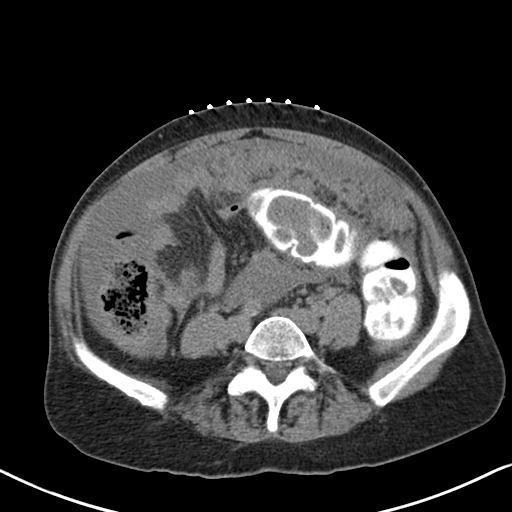
[im 23/39  soft-tissue]
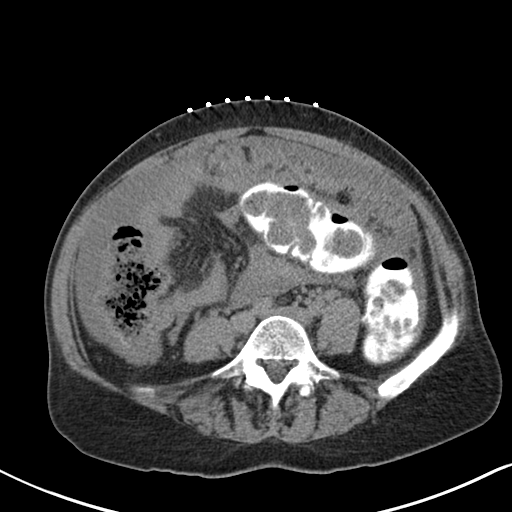
[im 26/39  soft-tissue]
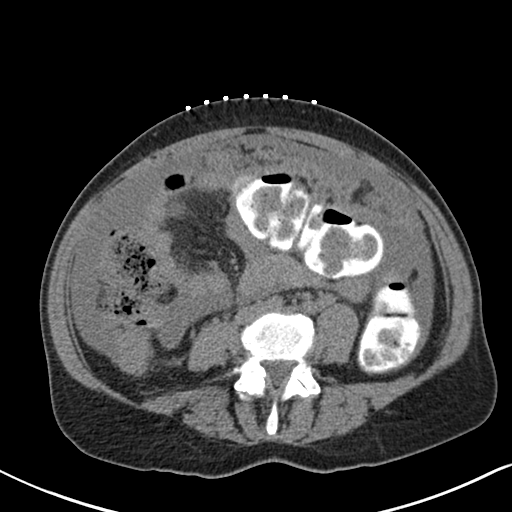
[im 26/39  bone]
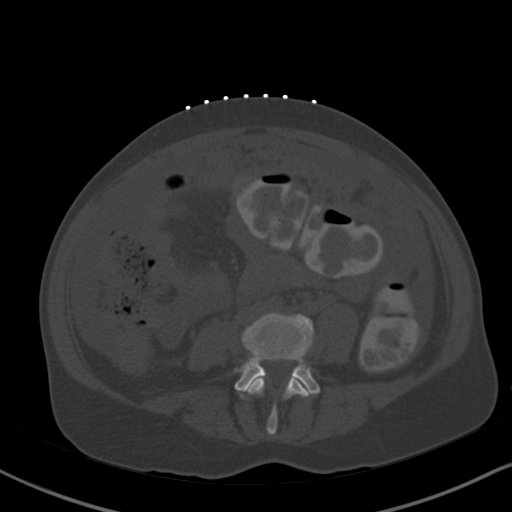
[im 28/39  soft-tissue]
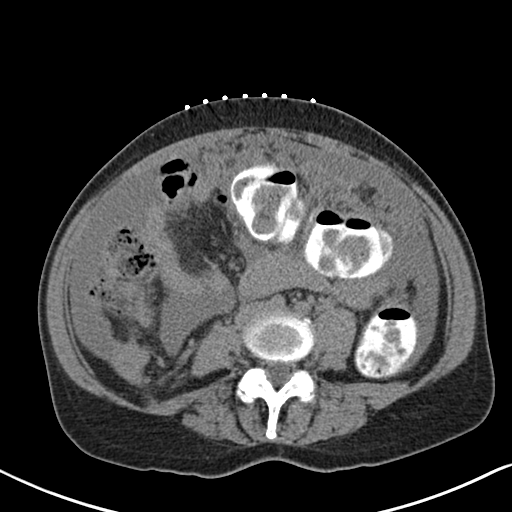
[im 28/39  lung]
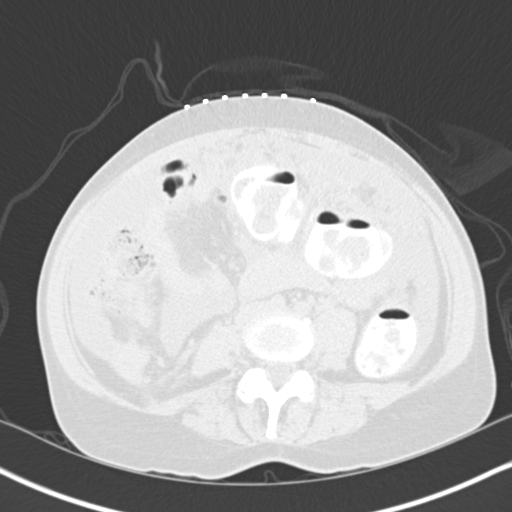
[im 31/39  soft-tissue]
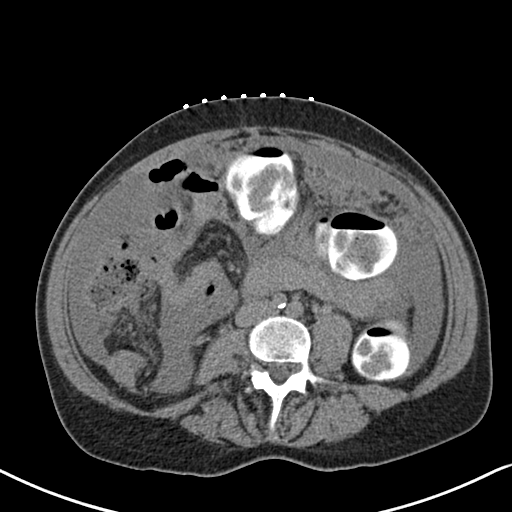
[im 31/39  lung]
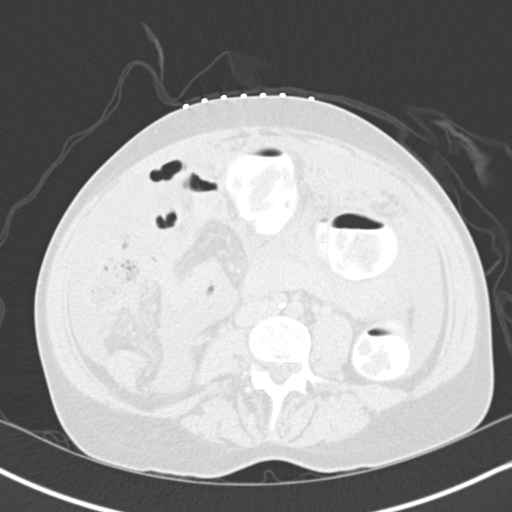
[im 33/39  soft-tissue]
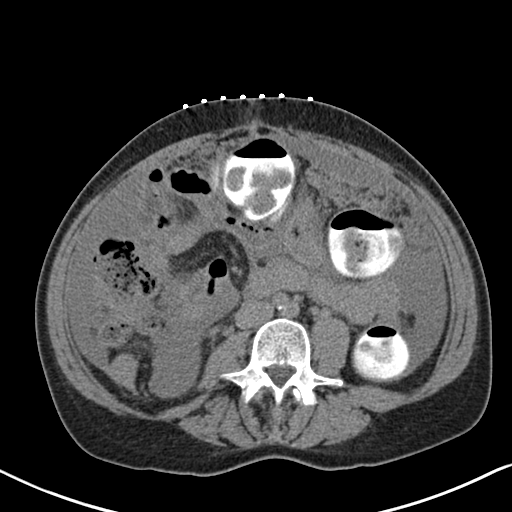
[im 33/39  lung]
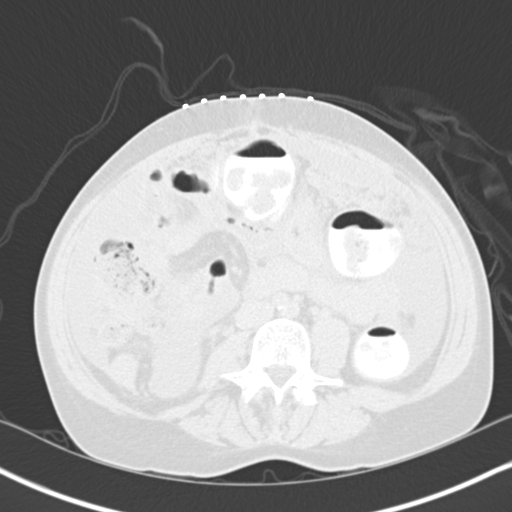
[im 36/39  soft-tissue]
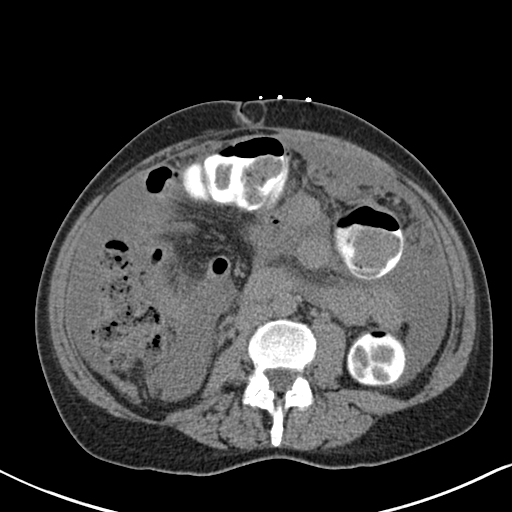
[im 36/39  lung]
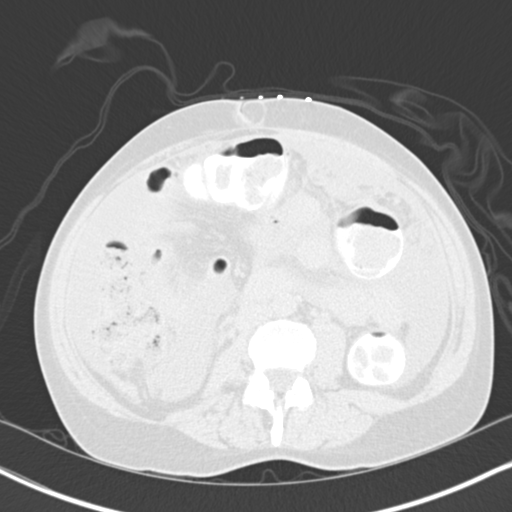

[13 of 32 positions shown; findings below may reference images not displayed]

EXAM:
CT-DIRECTED BIOPSY

MEDICATIONS:
None.

ANESTHESIA/SEDATION:
Moderate (conscious) sedation was employed during this procedure. A
total of Versed 3 mg and Fentanyl 50 mcg was administered
intravenously.

Moderate Sedation Time: 15 minutes. The patient's level of
consciousness and vital signs were monitored continuously by
radiology nursing throughout the procedure under my direct
supervision.

FLUOROSCOPY TIME:  Fluoroscopy Time: 0 minutes 0 seconds (0 mGy).

COMPLICATIONS:
None immediate.

PROCEDURE:
After discussing the risks and benefits of this procedure the
patient informed consent was obtained. Rectal/colonic contrast
administered. Abdomen was sterilely prepped and draped. Following
local anesthesia with 1% lidocaine CT-directed omental biopsy was
performed and 2 separate 23 mm 18 gauge core samples obtained. There
no complications.
IMPRESSION: Successful CT-directed omental mass biopsy.

## 2020-12-11 ENCOUNTER — Inpatient Hospital Stay: Payer: Self-pay

## 2020-12-11 ENCOUNTER — Encounter: Payer: Self-pay | Admitting: Oncology

## 2020-12-11 ENCOUNTER — Inpatient Hospital Stay (HOSPITAL_BASED_OUTPATIENT_CLINIC_OR_DEPARTMENT_OTHER): Payer: Self-pay | Admitting: Oncology

## 2020-12-11 ENCOUNTER — Other Ambulatory Visit: Payer: Self-pay

## 2020-12-11 VITALS — BP 122/84 | HR 79 | Temp 99.4°F | Resp 17 | Ht 66.0 in | Wt 115.6 lb

## 2020-12-11 DIAGNOSIS — C786 Secondary malignant neoplasm of retroperitoneum and peritoneum: Secondary | ICD-10-CM

## 2020-12-11 DIAGNOSIS — C569 Malignant neoplasm of unspecified ovary: Secondary | ICD-10-CM

## 2020-12-11 DIAGNOSIS — C482 Malignant neoplasm of peritoneum, unspecified: Secondary | ICD-10-CM

## 2020-12-11 LAB — CBC WITH DIFFERENTIAL/PLATELET
Abs Immature Granulocytes: 0.47 10*3/uL — ABNORMAL HIGH (ref 0.00–0.07)
Basophils Absolute: 0 10*3/uL (ref 0.0–0.1)
Basophils Relative: 0 %
Eosinophils Absolute: 0 10*3/uL (ref 0.0–0.5)
Eosinophils Relative: 0 %
HCT: 36 % (ref 36.0–46.0)
Hemoglobin: 11.4 g/dL — ABNORMAL LOW (ref 12.0–15.0)
Immature Granulocytes: 5 %
Lymphocytes Relative: 9 %
Lymphs Abs: 0.8 10*3/uL (ref 0.7–4.0)
MCH: 29.6 pg (ref 26.0–34.0)
MCHC: 31.7 g/dL (ref 30.0–36.0)
MCV: 93.5 fL (ref 80.0–100.0)
Monocytes Absolute: 0.5 10*3/uL (ref 0.1–1.0)
Monocytes Relative: 6 %
Neutro Abs: 7.6 10*3/uL (ref 1.7–7.7)
Neutrophils Relative %: 80 %
Platelets: 212 10*3/uL (ref 150–400)
RBC: 3.85 MIL/uL — ABNORMAL LOW (ref 3.87–5.11)
RDW: 17.2 % — ABNORMAL HIGH (ref 11.5–15.5)
WBC: 9.5 10*3/uL (ref 4.0–10.5)
nRBC: 0 % (ref 0.0–0.2)

## 2020-12-11 LAB — COMPREHENSIVE METABOLIC PANEL
ALT: 197 U/L — ABNORMAL HIGH (ref 0–44)
AST: 89 U/L — ABNORMAL HIGH (ref 15–41)
Albumin: 2.6 g/dL — ABNORMAL LOW (ref 3.5–5.0)
Alkaline Phosphatase: 562 U/L — ABNORMAL HIGH (ref 38–126)
Anion gap: 11 (ref 5–15)
BUN: 26 mg/dL — ABNORMAL HIGH (ref 8–23)
CO2: 26 mmol/L (ref 22–32)
Calcium: 8.4 mg/dL — ABNORMAL LOW (ref 8.9–10.3)
Chloride: 93 mmol/L — ABNORMAL LOW (ref 98–111)
Creatinine, Ser: 0.57 mg/dL (ref 0.44–1.00)
GFR, Estimated: >60 mL/min (ref >60)
Glucose, Bld: 118 mg/dL — ABNORMAL HIGH (ref 70–99)
Potassium: 3.9 mmol/L (ref 3.5–5.1)
Sodium: 130 mmol/L — ABNORMAL LOW (ref 135–145)
Total Bilirubin: 1.4 mg/dL — ABNORMAL HIGH (ref 0.3–1.2)
Total Protein: 7.2 g/dL (ref 6.5–8.1)

## 2020-12-11 LAB — URINALYSIS, DIPSTICK ONLY
Bilirubin Urine: NEGATIVE
Glucose, UA: NEGATIVE mg/dL
Hgb urine dipstick: NEGATIVE
Ketones, ur: NEGATIVE mg/dL
Leukocytes,Ua: NEGATIVE
Nitrite: NEGATIVE
Protein, ur: NEGATIVE mg/dL
Specific Gravity, Urine: 1.018 (ref 1.005–1.030)
pH: 5 (ref 5.0–8.0)

## 2020-12-11 MED ORDER — GABAPENTIN 300 MG PO CAPS: 300.0000 mg | ORAL_CAPSULE | Freq: Two times a day (BID) | ORAL | 1 refills | Status: DC

## 2020-12-12 ENCOUNTER — Encounter: Payer: Self-pay | Admitting: Oncology

## 2020-12-12 LAB — CA 125: Cancer Antigen (CA) 125: 565 U/mL — ABNORMAL HIGH (ref 0.0–38.1)

## 2020-12-18 ENCOUNTER — Encounter: Payer: Self-pay | Admitting: Oncology

## 2020-12-18 ENCOUNTER — Inpatient Hospital Stay: Payer: Self-pay

## 2020-12-20 ENCOUNTER — Emergency Department: Payer: Self-pay

## 2020-12-20 ENCOUNTER — Other Ambulatory Visit: Payer: Self-pay

## 2020-12-20 ENCOUNTER — Inpatient Hospital Stay (HOSPITAL_BASED_OUTPATIENT_CLINIC_OR_DEPARTMENT_OTHER): Payer: Self-pay | Admitting: Hospice and Palliative Medicine

## 2020-12-20 ENCOUNTER — Inpatient Hospital Stay
Admission: EM | Admit: 2020-12-20 | Discharge: 2020-12-21 | DRG: 445 | Disposition: A | Discharge status: Other Acute Inpatient Hospital | Payer: Self-pay | Attending: Emergency Medicine | Admitting: Emergency Medicine

## 2020-12-20 ENCOUNTER — Inpatient Hospital Stay: Payer: Self-pay | Attending: Hospice and Palliative Medicine

## 2020-12-20 ENCOUNTER — Telehealth: Payer: Self-pay | Admitting: *Deleted

## 2020-12-20 VITALS — BP 117/75 | HR 89 | Temp 97.2°F | Resp 18 | Wt 114.4 lb

## 2020-12-20 DIAGNOSIS — D649 Anemia, unspecified: Secondary | ICD-10-CM | POA: Insufficient documentation

## 2020-12-20 DIAGNOSIS — G4700 Insomnia, unspecified: Secondary | ICD-10-CM | POA: Insufficient documentation

## 2020-12-20 DIAGNOSIS — E871 Hypo-osmolality and hyponatremia: Secondary | ICD-10-CM | POA: Insufficient documentation

## 2020-12-20 DIAGNOSIS — Z8543 Personal history of malignant neoplasm of ovary: Secondary | ICD-10-CM

## 2020-12-20 DIAGNOSIS — K838 Other specified diseases of biliary tract: Secondary | ICD-10-CM

## 2020-12-20 DIAGNOSIS — Z20822 Contact with and (suspected) exposure to covid-19: Secondary | ICD-10-CM | POA: Diagnosis present

## 2020-12-20 DIAGNOSIS — R17 Unspecified jaundice: Secondary | ICD-10-CM

## 2020-12-20 DIAGNOSIS — C569 Malignant neoplasm of unspecified ovary: Secondary | ICD-10-CM

## 2020-12-20 DIAGNOSIS — R509 Fever, unspecified: Secondary | ICD-10-CM

## 2020-12-20 DIAGNOSIS — C787 Secondary malignant neoplasm of liver and intrahepatic bile duct: Secondary | ICD-10-CM | POA: Diagnosis present

## 2020-12-20 DIAGNOSIS — K8309 Other cholangitis: Principal | ICD-10-CM | POA: Diagnosis present

## 2020-12-20 DIAGNOSIS — C785 Secondary malignant neoplasm of large intestine and rectum: Secondary | ICD-10-CM | POA: Diagnosis present

## 2020-12-20 DIAGNOSIS — Z85850 Personal history of malignant neoplasm of thyroid: Secondary | ICD-10-CM

## 2020-12-20 DIAGNOSIS — Z79899 Other long term (current) drug therapy: Secondary | ICD-10-CM | POA: Insufficient documentation

## 2020-12-20 DIAGNOSIS — G629 Polyneuropathy, unspecified: Secondary | ICD-10-CM | POA: Insufficient documentation

## 2020-12-20 DIAGNOSIS — K7290 Hepatic failure, unspecified without coma: Secondary | ICD-10-CM | POA: Insufficient documentation

## 2020-12-20 DIAGNOSIS — C786 Secondary malignant neoplasm of retroperitoneum and peritoneum: Secondary | ICD-10-CM | POA: Diagnosis present

## 2020-12-20 LAB — COMPREHENSIVE METABOLIC PANEL
ALT: 202 U/L — ABNORMAL HIGH (ref 0–44)
AST: 178 U/L — ABNORMAL HIGH (ref 15–41)
Albumin: 1.9 g/dL — ABNORMAL LOW (ref 3.5–5.0)
Alkaline Phosphatase: 1338 U/L — ABNORMAL HIGH (ref 38–126)
Anion gap: 14 (ref 5–15)
BUN: 30 mg/dL — ABNORMAL HIGH (ref 8–23)
CO2: 22 mmol/L (ref 22–32)
Calcium: 8.4 mg/dL — ABNORMAL LOW (ref 8.9–10.3)
Chloride: 94 mmol/L — ABNORMAL LOW (ref 98–111)
Creatinine, Ser: 0.65 mg/dL (ref 0.44–1.00)
GFR, Estimated: >60 mL/min (ref >60)
Glucose, Bld: 177 mg/dL — ABNORMAL HIGH (ref 70–99)
Potassium: 3.5 mmol/L (ref 3.5–5.1)
Sodium: 130 mmol/L — ABNORMAL LOW (ref 135–145)
Total Bilirubin: 9 mg/dL — ABNORMAL HIGH (ref 0.3–1.2)
Total Protein: 7.1 g/dL (ref 6.5–8.1)

## 2020-12-20 LAB — CBC
HCT: 31.7 % — ABNORMAL LOW (ref 36.0–46.0)
Hemoglobin: 10.2 g/dL — ABNORMAL LOW (ref 12.0–15.0)
MCH: 29.4 pg (ref 26.0–34.0)
MCHC: 32.2 g/dL (ref 30.0–36.0)
MCV: 91.4 fL (ref 80.0–100.0)
Platelets: 292 10*3/uL (ref 150–400)
RBC: 3.47 MIL/uL — ABNORMAL LOW (ref 3.87–5.11)
RDW: 18.1 % — ABNORMAL HIGH (ref 11.5–15.5)
WBC: 11 10*3/uL — ABNORMAL HIGH (ref 4.0–10.5)
nRBC: 0.2 % (ref 0.0–0.2)

## 2020-12-20 LAB — RESP PANEL BY RT-PCR (FLU A&B, COVID) ARPGX2
Influenza A by PCR: NEGATIVE
Influenza B by PCR: NEGATIVE
SARS Coronavirus 2 by RT PCR: NEGATIVE

## 2020-12-20 MED ORDER — LORAZEPAM 2 MG/ML IJ SOLN
0.5000 mg | Freq: Once | INTRAMUSCULAR | Status: AC
  Administered 2020-12-20: 0.5 mg via INTRAVENOUS
  Filled 2020-12-20: qty 1

## 2020-12-20 MED ORDER — LACTATED RINGERS IV BOLUS
1000.0000 mL | Freq: Once | INTRAVENOUS | Status: AC
  Administered 2020-12-21: 1000 mL via INTRAVENOUS

## 2020-12-20 MED ORDER — PIPERACILLIN-TAZOBACTAM 3.375 G IVPB 30 MIN
3.3750 g | Freq: Once | INTRAVENOUS | Status: AC
  Administered 2020-12-20: 3.375 g via INTRAVENOUS
  Filled 2020-12-20: qty 50

## 2020-12-20 MED ORDER — GADOBUTROL 1 MMOL/ML IV SOLN
5.0000 mL | Freq: Once | INTRAVENOUS | Status: AC | PRN
  Administered 2020-12-20: 7.5 mL via INTRAVENOUS

## 2020-12-20 NOTE — ED Notes (Signed)
Patient is resting comfortably. 

## 2020-12-20 NOTE — ED Triage Notes (Signed)
Pt sent over from the cancer center. [facility], pt currently with Ovarian cancer and presented today Jaundice. Pt sent over for possible liver failure.

## 2020-12-20 NOTE — ED Notes (Signed)
Patient transported to MRI 

## 2020-12-20 NOTE — ED Provider Notes (Signed)
[facility] Emergency Department Provider Note   ____________________________________________   I have reviewed the triage vital signs and the nursing notes.   HISTORY  Chief Complaint Elevated liver enzymes   History limited by: Not Limited   HPI Amanda Pena is a 65 y.o. female who presents to the emergency department today sent from cancer center because of concerns for elevated liver enzymes and possible biliary obstruction.  Patient has a history of metastatic cancer.  She states over the past week she started feeling worse.  She endorses fatigue.  Couple of days ago family member started noticing some yellowing around her eyes skin.  Blood work performed at oncology center today was concerning for elevation of bilirubin alk phos AST and ALT.  Patient denies any pain.  She does state that she has had intermittent fever over the past week.  Denies any cough or shortness of breath.  Denies any urinary pain or burning.  Records reviewed. Per medical record review patient has a history of metastatic ovarian cancer.  Past Medical History:  Diagnosis Date  . Cancer ([place])   . Family history of breast cancer   . Family history of lung cancer   . HYPERLIPIDEMIA 04/09/2008   Qualifier: Diagnosis of  By: Wynona Luna   . HYPOTHYROIDISM 04/09/2008   Qualifier: Diagnosis of  By: Wynona Luna   . INSOMNIA, CHRONIC 08/21/2010   Qualifier: Diagnosis of  By: Wynona Luna   . PERSONAL HISTORY MALIGNANT NEOPLASM THYROID 08/21/2010   Qualifier: Diagnosis of  By: Wynona Luna     Patient Active Problem List   Diagnosis Date Noted  . Complete intestinal obstruction ([place])   . Metastatic malignant neoplasm ([place])   . Malnutrition of moderate degree 10/28/2020  . Small bowel obstruction due to adhesions ([place]) 10/28/2020  . Hospice care   . SBO (small bowel obstruction) ([place]) 10/27/2020  . 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  COVID-19 vaccine series declined 05/28/2020  . Peritoneal carcinomatosis ([place]) 12/19/2019  . Genetic testing 11/16/2019  . Family history of breast cancer   . Family history of lung cancer   . Postoperative pain 10/04/2019  . Other constipation 10/04/2019  . Peritoneal carcinoma ([place]) 06/23/2019  . Malignant neoplasm of ovary ([place]) 06/23/2019  . Abdominal pain 06/13/2019  . LLQ abdominal pain 06/13/2019  . Calcific bursitis of shoulder 01/29/2016  . Cough 10/15/2014  . Cervical pain (neck) 10/15/2014  . Situational anxiety 03/07/2014  . Goals of care, counseling/discussion 02/10/2013  . 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 Preventative health care 11/09/2011  . INSOMNIA, CHRONIC 08/21/2010  . PERSONAL HISTORY MALIGNANT NEOPLASM THYROID 08/21/2010  . Hypothyroidism 04/09/2008  . Hyperlipidemia 04/09/2008  . Sinusitis 04/09/2008    Past Surgical History:  Procedure Laterality Date  . IR IMAGING GUIDED PORT INSERTION  07/18/2019  . THYROIDECTOMY, PARTIAL      Prior to Admission medications   Medication Sig Start Date End Date Taking? Authorizing Provider  ALPRAZolam Duanne Moron) 0.5 MG tablet Take 1 tablet (0.5 mg total) by mouth 2 (two) times daily as needed for anxiety. 03/14/20   Plotnikov, [name], MD  amitriptyline (ELAVIL) 50 MG tablet Take 1 tablet (50 mg total) by mouth at bedtime. Patient not taking: No sig reported 11/19/20 02/17/21  Ventura Sellers, MD  Artificial Tear Solution (20/20 ARTIFICIAL TEARS OP) Apply 2 drops to eye daily as needed (dry eyes).  Plotnikov, [name], MD  dexamethasone (DECADRON) 0.5 MG/5ML solution Take 40 mLs (4 mg total) by mouth 2 (two) times daily. 11/27/20   Verlon Au, NP  fentaNYL (DURAGESIC) 12 MCG/HR Place 1 patch onto the skin every 3 (three) days. Patient not taking: Reported on 12/20/2020 11/07/20   Verlon Au, NP  gabapentin (NEURONTIN) 300 MG capsule Take 1 capsule (300 mg total) by mouth 2 (two) times daily.  12/11/20   Lloyd Huger, MD  glycopyrrolate (ROBINUL) 1 MG tablet Take 1 tablet (1 mg total) by mouth 3 (three) times daily. 11/07/20   Verlon Au, NP  haloperidol (HALDOL) 2 MG/ML solution Take 0.5-1 mLs (1-2 mg total) by mouth every 4 (four) hours as needed (for nausea or vomiting). Patient not taking: No sig reported 11/07/20   Verlon Au, NP  MELATONIN PO Take 5 mg by mouth daily as needed (sleep).    Plotnikov, [name], MD  metoCLOPramide (REGLAN) 5 MG/5ML solution TAKE 5 ML by mouth THREE TIMES DAILY. INCREASE TO 10 ML IF TOLERATING. DISCONTINUE IF ABDOMINAL CRAMPING. 11/27/20   Verlon Au, NP  Morphine Sulfate (MORPHINE CONCENTRATE) 10 mg / 0.5 ml concentrated solution Take 0.25 mLs (5 mg total) by mouth every 2 (two) hours as needed for severe pain. Patient not taking: No sig reported 11/06/20   Max Sane, MD  ondansetron (ZOFRAN) 8 MG tablet One pill every 8 hours as needed for nausea/vomitting. Patient not taking: No sig reported 06/23/19   Cammie Sickle, MD  SYNTHROID 75 MCG tablet Take 1 tablet (75 mcg total) by mouth daily before breakfast. 09/09/20   Plotnikov, [name], MD  rosuvastatin (CRESTOR) 5 MG tablet Take 1 tablet (5 mg total) by mouth daily. 05/17/19 05/17/19  Plotnikov, [name], MD    Allergies Patient has no known allergies.  Family History  Problem Relation Age of Onset  . Arthritis Mother   . Hyperlipidemia Mother   . Hyperlipidemia Father   . Diabetes Father   . Hypertension Father   . Heart disease Father 10       CAD  . Lung cancer Father   . Breast cancer Maternal Grandmother        in 35s  . Cancer Maternal Aunt        unk type  . Cancer Maternal Uncle        unk type  . Cancer Paternal Uncle        unk type, d. 28s  . Skin cancer Daughter        basal cell     Social History Social History   Tobacco Use  . Smoking status: Never Smoker  . Smokeless tobacco: Never Used  Vaping Use  . Vaping Use: Never used   Substance Use Topics  . Alcohol use: Not Currently  . Drug use: No    Review of Systems Constitutional: Positive for weakness.  Eyes: No visual changes. Yellowing around eyes.  ENT: No sore throat. Cardiovascular: Denies chest pain. Respiratory: Denies shortness of breath. Gastrointestinal: No abdominal pain.  No nausea, no vomiting.  No diarrhea.   Genitourinary: Negative for dysuria. Positive for darkening urine.  Musculoskeletal: Negative for back pain. Skin: Negative for rash. Neurological: Negative for headaches, focal weakness or numbness.  ____________________________________________   PHYSICAL EXAM:  VITAL SIGNS: ED Triage Vitals  Enc Vitals Group     BP 12/20/20 1704 118/77     Pulse Rate 12/20/20 1704 96  Resp 12/20/20 1704 20     Temp 12/20/20 1704 (!) 97.5 F (36.4 C)     Temp Source 12/20/20 1704 Oral     SpO2 12/20/20 1704 100 %     Weight 12/20/20 1707 251 lb 5.2 oz (114 kg)     Height 12/20/20 1707 _0  (1.676 m)     Head Circumference --      Peak Flow --      Pain Score 12/20/20 1707 0   Constitutional: Alert and oriented.  Eyes: Scleral icterus ENT      Head: Normocephalic and atraumatic.      Nose: No congestion/rhinnorhea.      Mouth/Throat: Mucous membranes are moist.      Neck: No stridor. Hematological/Lymphatic/Immunilogical: No cervical lymphadenopathy. Cardiovascular: Normal rate, regular rhythm.  No murmurs, rubs, or gallops.  Respiratory: Normal respiratory effort without tachypnea nor retractions. Breath sounds are clear and equal bilaterally. No wheezes/rales/rhonchi. Gastrointestinal: Soft and non tender. No rebound. No guarding.  Genitourinary: Deferred Musculoskeletal: Normal range of motion in all extremities. No lower extremity edema. Neurologic:  Normal speech and language. No gross focal neurologic deficits are appreciated.  Skin:  Skin is warm, dry and intact. No rash noted. Psychiatric: Mood and affect are normal.  Speech and behavior are normal. Patient exhibits appropriate insight and judgment.  ____________________________________________    LABS (pertinent positives/negatives)  Covid negative Lab work from oncology clinic reviewed  ____________________________________________   EKG  None  ____________________________________________    RADIOLOGY  CT abd/pel IMPRESSION:  Marked increase in size of large low-attenuation liver masses,  consistent with metastatic disease.    Stable partially calcified mild retroperitoneal lymphadenopathy,  consistent with metastatic disease.    Large amount of ascites, without significant change.    New small right pleural effusion and right basilar atelectasis.    Resolution of small bowel obstruction since prior study.   MRI MRCP IMPRESSION:  Marked interval progression of disease with enlargement of the  dominant hepatic metastases.    Interval development of multifocal intrahepatic biliary ductal  dilation with areas of a periportal edema in keeping with  cholangitis best appreciated within the hepatic dome. The central  ducts in the region of the biliary hilum appear obliterated, though  adjacent mass is not identified and there is no significant  associated mass effect from the known hepatic metastases suggesting  that this represents an intrinsic inflammatory process, such as a  secondary, drug induced sclerosing cholangitis.    Large volume ascites, similar to prior examination. Small right  pleural effusion.   ____________________________________________   PROCEDURES  Procedures  ____________________________________________   INITIAL IMPRESSION / ASSESSMENT AND PLAN / ED COURSE  Pertinent labs & imaging results that were available during my care of the patient were reviewed by me and considered in my medical decision making (see chart for details).   Patient presented to the emergency department today because of  concerns for jaundice and elevated bilirubin that was found that oncology clinic today.  Patient has history of metastatic ovarian cancer.  Initially CT scan was obtained which showed enlargement of hepatic lesions.  MRCP was then obtained which was concerning for biliary duct dilatation. Additionally patient did start having fever here. Antibiotics was ordered.  I discussed with Dr. Rush Landmark with GI at [facility] for potential ERCP with stent placement. At this time he did not feel that given imaging that ERCP would be successful or benificial. Recommended contacting IR. Will page out to IR.  ____________________________________________   FINAL CLINICAL IMPRESSION(S) / ED DIAGNOSES  Final diagnoses:  Jaundice  Fever, unspecified fever cause     Note: This dictation was prepared with Dragon dictation. Any transcriptional errors that result from this process are unintentional     Nance Pear, MD 12/21/20 [phone]

## 2020-12-20 NOTE — Progress Notes (Signed)
Pt here for LE edema and jaundice. Pt states that she has had a fever on and off for about 1 week. Face and  sclera of eyes appear yellow. States that her daughter first noticed color change a couple of days ago. Denies any pain, nausea, vomiting, or diarrhea. States that abdominal distention is slightly worse from baseline.

## 2020-12-20 NOTE — Progress Notes (Signed)
Symptom Management [place]  [phone] [phone]  Patient Care Team: Plotnikov, [name], MD as PCP - General (Internal Medicine) Arvella Nigh, MD as Consulting Physician (Obstetrics and Gynecology) Clent Jacks, RN as Oncology Nurse Navigator   Name of the patient: Amanda Pena  [id number]  [birth year]   Date of visit: 12/20/20  Reason for Consult: Jaylee Freeze is a 65 y.o. 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 PMH also notable for CIPN, history of thyroid cancer status post thyroidectomy, anxiety, depression, insomnia, and hyperlipidemia.   Patient is followed locally by [facility]. She has most recently been treated withthird line Taxol/Avastin.  Patient was hospitalized4/10/22 to 4/14/2022with intractable nausea, vomiting and abdominal pain. CT of the abdomen and pelvis was consistent with SBO and progressive liver metastasis and worsening peritoneal disease. Patient's symptoms improved with conservative management including use of gastric decompression via NGT and octreotide/dexamethasone. Patient was able to tolerate a liquid diet at time of discharge but then nausea and vomiting recurred two days after discharge. Patient was again readmitted 11/04/2020 to 11/07/2020 with recurrent SBO, presumably of malignant etiology. Patient was again treated conservatively with octreotide, dexamethasone, and metoclopramide.  She was discharged home with hospice but had significant improvement and essentially returned to her baseline.  Patient later revoked hospice services.    Patient was most recently seen by Dr. Grayland Ormond on 12/11/2020 and given her overall improvement, there was  a plan to reinitiate treatment with single agent gemcitabine.   Patient presents to [facility] on 12/20/2020 with 1 week of fevers in 24 hours of jaundice.  T-max 101 today.  Patient denies nausea, vomiting, diarrhea, constipation, abdominal pain, or worsening distention.  She feels fairly asymptomatic.  Denies any neurologic complaints. Denies recent fevers or illnesses. Denies any easy bleeding or bruising. Reports good appetite and denies weight loss. Denies chest pain. Denies any nausea, vomiting, constipation, or diarrhea. Denies urinary complaints. Patient offers no further specific complaints today.  PAST MEDICAL HISTORY: Past Medical History:  Diagnosis Date  . Cancer ([place])   . Family history of breast cancer   . Family history of lung cancer   . HYPERLIPIDEMIA 04/09/2008   Qualifier: Diagnosis of  By: Wynona Luna   . HYPOTHYROIDISM 04/09/2008   Qualifier: Diagnosis of  By: Wynona Luna   . INSOMNIA, CHRONIC 08/21/2010   Qualifier: Diagnosis of  By: Wynona Luna   . PERSONAL HISTORY MALIGNANT NEOPLASM THYROID 08/21/2010   Qualifier: Diagnosis of  By: Wynona Luna     PAST SURGICAL HISTORY:  Past Surgical History:  Procedure Laterality Date  . IR IMAGING GUIDED PORT INSERTION  07/18/2019  . THYROIDECTOMY, PARTIAL      HEMATOLOGY/ONCOLOGY HISTORY:  Oncology History Overview Note  #November 2020-omental caking/peritoneal carcinomatosis; bilateral adnexal masses 4-5 cm in size; multiple lung nodules;   # dec 8th 2020-omental biopsy; positive for high-grade serous adenocarcinoma; GYN origin.  CT scan chest-bilateral lung nodules; 3.8 cm soft tissue mass adjacent to GE junction  # DEC 11TH 2020-CARBO-Taxol [chemo consent] x 3 cycles; February 2021-improvement of the peritoneal carcinomatosis; lung lesions.. 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  Partial colectomy  #  #Genetic counseling-s/p NEG myRiskMyriad.   #  Thyroid cancer at 26y s/p thyroidectomy [no RAIU]  # NGS/MOLECULAR TESTS: My risk myriad negative; HRD negative; NGS-P  # PALLIATIVE CARE EVALUATION:P  # PAIN MANAGEMENT: P   DIAGNOSIS: Bilateral ovarian cancer/tube/peritoneal  STAGE: IV   ;  GOALS: Control  CURRENT/MOST RECENT THERAPY : Carbotaxol [C]    Peritoneal carcinoma ([place])  06/23/2019 Initial Diagnosis   Peritoneal carcinoma ([place])   06/30/2019 - 11/17/2019 Chemotherapy   The patient had dexamethasone (DECADRON) 4 MG tablet, 8 mg, Oral, Daily, 1 of 1 cycle, Start date: --, End date: -- palonosetron (ALOXI) injection 0.25 mg, 0.25 mg, Intravenous,  Once, 6 of 6 cycles Administration: 0.25 mg (06/30/2019), 0.25 mg (07/24/2019), 0.25 mg (08/14/2019), 0.25 mg (10/06/2019), 0.25 mg (10/27/2019), 0.25 mg (11/17/2019) pegfilgrastim (NEULASTA ONPRO KIT) injection 6 mg, 6 mg, Subcutaneous, Once, 1 of 1 cycle Administration: 6 mg (11/17/2019) CARBOplatin (PARAPLATIN) 570 mg in sodium chloride 0.9 % 250 mL chemo infusion, 570 mg (100 % of original dose 567 mg), Intravenous,  Once, 6 of 6 cycles Dose modification:   (original dose 567 mg, Cycle 1) Administration: 570 mg (06/30/2019), 570 mg (07/24/2019), 570 mg (08/14/2019), 520 mg (10/27/2019), 520 mg (11/17/2019) fosaprepitant (EMEND) 150 mg in sodium chloride 0.9 % 145 mL IVPB, 150 mg, Intravenous,  Once, 1 of 1 cycle PACLitaxel (TAXOL) 288 mg in sodium chloride 0.9 % 250 mL chemo infusion (> $RemoveBef'80mg'YcBrEVMSZL$ /m2), 175 mg/m2 = 288 mg, Intravenous,  Once, 6 of 6 cycles Administration: 288 mg (06/30/2019), 288 mg (07/24/2019), 288 mg (08/14/2019), 288 mg (10/06/2019), 288 mg (10/27/2019), 288 mg (11/17/2019) fosaprepitant (EMEND) 150 mg, dexamethasone (DECADRON) 12 mg in sodium chloride 0.9 % 145 mL IVPB, , Intravenous,  Once, 5 of 5 cycles Administration:  (07/24/2019),  (08/14/2019),  (10/06/2019),  (10/27/2019),  (11/17/2019)  for chemotherapy treatment.     Genetic Testing    Negative genetic testing. No pathogenic variants identified on the Myriad MyRisk+HRD. The report date is 10/02/2019.  The [facility] gene panel offered by [employer] includes sequencing and deletion/duplication testing of the following 35 genes: APC, ATM, AXIN2, BARD1, BMPR1A, BRCA1, BRCA2, BRIP1, CHD1, CDK4, CDKN2A, CHEK2, EPCAM (large rearrangement only), HOXB13, GALNT12, MLH1, MSH2, MSH3, MSH6, MUTYH, NBN, NTHL1, PALB2, PMS2, PTEN, RAD51C, RAD51D, RNF43, RPS20, SMAD4, STK11, and TP53. Sequencing was performed for select regions of POLE and POLD1, and large rearrangement analysis was performed for select regions of GREM1.    12/19/2019 - 12/19/2019 Chemotherapy   The patient had DOXOrubicin HCL LIPOSOMAL (DOXIL) 64 mg in dextrose 5 % 250 mL chemo infusion, 40 mg/m2, Intravenous,  Once, 0 of 6 cycles bevacizumab-bvzr (ZIRABEV) 500 mg in sodium chloride 0.9 % 100 mL chemo infusion, 10 mg/kg, Intravenous,  Once, 0 of 6 cycles  for chemotherapy treatment.    07/17/2020 -  Chemotherapy    Patient is on Treatment Plan: OVARIAN PACLITAXEL  E3,3,43,56 + BEVACIZUMAB D1,15 Q28D      Malignant neoplasm of ovary ([place])  06/23/2019 Initial Diagnosis   Malignant neoplasm of ovary ([place])   12/19/2019 - 12/19/2019 Chemotherapy   The patient had DOXOrubicin HCL LIPOSOMAL (DOXIL) 64 mg in dextrose 5 % 250 mL chemo infusion, 40 mg/m2, Intravenous,  Once, 0 of 6 cycles bevacizumab-bvzr (ZIRABEV) 500 mg in sodium chloride 0.9 % 100 mL chemo infusion, 10 mg/kg, Intravenous,  Once, 0 of 6 cycles  for chemotherapy treatment.    07/17/2020 -  Chemotherapy    Patient is on Treatment Plan: OVARIAN PACLITAXEL  Y6,1,68,37 + BEVACIZUMAB D1,15 Q28D  07/17/2020 Cancer Staging   Staging form: Ovary, Fallopian Tube, and Primary Peritoneal Carcinoma, AJCC 8th Edition - Clinical: FIGO Stage IVA (pM1a) - Signed by Lloyd Huger, MD on 07/17/2020   Peritoneal carcinomatosis ([place])  12/19/2019 - 12/19/2019  Chemotherapy   The patient had DOXOrubicin HCL LIPOSOMAL (DOXIL) 64 mg in dextrose 5 % 250 mL chemo infusion, 40 mg/m2, Intravenous,  Once, 0 of 6 cycles bevacizumab-bvzr (ZIRABEV) 500 mg in sodium chloride 0.9 % 100 mL chemo infusion, 10 mg/kg, Intravenous,  Once, 0 of 6 cycles  for chemotherapy treatment.    12/19/2019 Initial Diagnosis   Peritoneal carcinomatosis ([place])   07/17/2020 -  Chemotherapy   The patient had PACLitaxel (TAXOL) 126 mg in sodium chloride 0.9 % 250 mL chemo infusion (</= $RemoveBefor'80mg'mMDskkZGfMhz$ /m2), 80 mg/m2 = 126 mg, Intravenous,  Once, 0 of 4 cycles bevacizumab-bvzr (ZIRABEV) 500 mg in sodium chloride 0.9 % 100 mL chemo infusion, 10 mg/kg = 500 mg, Intravenous,  Once, 0 of 4 cycles  for chemotherapy treatment.    07/17/2020 -  Chemotherapy    Patient is on Treatment Plan: OVARIAN PACLITAXEL  V2,0,23,34 + BEVACIZUMAB D1,15 Q28D        ALLERGIES:  has No Known Allergies.  MEDICATIONS:  Current Outpatient Medications  Medication Sig Dispense Refill  . ALPRAZolam (XANAX) 0.5 MG tablet Take 1 tablet (0.5 mg total) by mouth 2 (two) times daily as needed for anxiety. 60 tablet 2  . amitriptyline (ELAVIL) 50 MG tablet Take 1 tablet (50 mg total) by mouth at bedtime. (Patient not taking: Reported on 12/11/2020) 90 tablet 0  . Artificial Tear Solution (20/20 ARTIFICIAL TEARS OP) Apply 2 drops to eye daily as needed (dry eyes).    Marland Kitchen dexamethasone (DECADRON) 0.5 MG/5ML solution Take 40 mLs (4 mg total) by mouth 2 (two) times daily. 500 mL 4  . fentaNYL (DURAGESIC) 12 MCG/HR Place 1 patch onto the skin every 3 (three) days. 10 patch 0  . gabapentin (NEURONTIN) 300 MG capsule Take 1 capsule (300 mg total) by mouth 2 (two) times daily. 60 capsule 1  . glycopyrrolate (ROBINUL) 1 MG tablet Take 1 tablet (1 mg total) by mouth 3 (three) times daily. 90 tablet 2  . haloperidol (HALDOL) 2 MG/ML solution Take 0.5-1 mLs (1-2 mg total) by mouth every 4 (four) hours as needed (for nausea or vomiting).  (Patient not taking: Reported on 12/11/2020) 120 mL 0  . MELATONIN PO Take 5 mg by mouth daily as needed (sleep).    . metoCLOPramide (REGLAN) 5 MG/5ML solution TAKE 5 ML by mouth THREE TIMES DAILY. INCREASE TO 10 ML IF TOLERATING. DISCONTINUE IF ABDOMINAL CRAMPING. 480 mL 3  . Morphine Sulfate (MORPHINE CONCENTRATE) 10 mg / 0.5 ml concentrated solution Take 0.25 mLs (5 mg total) by mouth every 2 (two) hours as needed for severe pain. (Patient not taking: Reported on 12/11/2020) 30 mL 0  . ondansetron (ZOFRAN) 8 MG tablet One pill every 8 hours as needed for nausea/vomitting. (Patient not taking: Reported on 12/11/2020) 40 tablet 1  . SYNTHROID 75 MCG tablet Take 1 tablet (75 mcg total) by mouth daily before breakfast. 30 tablet 0   No current facility-administered medications for this visit.    VITAL SIGNS: There were no vitals taken for this visit. There were no vitals filed for this visit.  Estimated body mass index is 18.66 kg/m as calculated from the following:   Height as of 12/11/20: $RemoveBef'5\' 6"'hfokMaieNl$  (1.676 m).   Weight as of  12/11/20: 115 lb 9.6 oz (52.4 kg).  LABS: CBC:    Component Value Date/Time   WBC 11.0 (H) 12/20/2020 1428   HGB 10.2 (L) 12/20/2020 1428   HCT 31.7 (L) 12/20/2020 1428   PLT 292 12/20/2020 1428   MCV 91.4 12/20/2020 1428   NEUTROABS 7.6 12/11/2020 0930   LYMPHSABS 0.8 12/11/2020 0930   MONOABS 0.5 12/11/2020 0930   EOSABS 0.0 12/11/2020 0930   BASOSABS 0.0 12/11/2020 0930   Comprehensive Metabolic Panel:    Component Value Date/Time   NA 130 (L) 12/20/2020 1428   K 3.5 12/20/2020 1428   CL 94 (L) 12/20/2020 1428   CO2 22 12/20/2020 1428   BUN 30 (H) 12/20/2020 1428   CREATININE 0.65 12/20/2020 1428   CREATININE 0.78 03/08/2014 0822   GLUCOSE 177 (H) 12/20/2020 1428   CALCIUM 8.4 (L) 12/20/2020 1428   AST 178 (H) 12/20/2020 1428   ALT 202 (H) 12/20/2020 1428   ALKPHOS 1,338 (H) 12/20/2020 1428   BILITOT 9.0 (H) 12/20/2020 1428   PROT 7.1 12/20/2020 1428    ALBUMIN 1.9 (L) 12/20/2020 1428    RADIOGRAPHIC STUDIES: No results found.  PERFORMANCE STATUS (ECOG) : 1 - Symptomatic but completely ambulatory  Review of Systems Unless otherwise noted, a complete review of systems is negative.  Physical Exam General: NAD Cardiovascular: regular rate and rhythm Pulmonary: clear ant fields Abdomen: distended, nontender, + bowel sounds GU: no suprapubic tenderness Extremities: trace BLE edema, no joint deformities Skin: no rashes Neurological: Weakness but otherwise nonfocal  Assessment and Plan- Patient is a 65 y.o. female stage IV ovarian cancer with recent progression noted on CT who presents to [facility] today for evaluation of fevers and jaundice.   Jaundice -suspect obstruction secondary to progression of her cancer as demonstrated on CT of abdomen and pelvis on 5/18.  Total bilirubin has increased significantly in the past week from 1.4 to 9.  She also has rising transaminases and alk phos.  Additionally, given fever and leukocytosis, highly recommended transfer to ER for work-up with probable hospitalization.  Patient reluctantly agreed to ER and plans to transport herself.  Report called to ER.  Case and plan discussed with Dr. Grayland Ormond who also suggested that patient transfer to the ER for evaluation and management.  Patient expressed understanding and was in agreement with this plan. She also understands that She can call clinic at any time with any questions, concerns, or complaints.   Thank you for allowing me to participate in the care of this very pleasant patient.   Time Total: 30 minutes  Visit consisted of counseling and education dealing with the complex and emotionally intense issues of symptom management and palliative care in the setting of serious and potentially life-threatening illness.Greater than 50%  of this time was spent counseling and coordinating care related to the above assessment and plan.  Signed by: Altha Harm, PhD, NP-C

## 2020-12-20 NOTE — Telephone Encounter (Signed)
Patient called reporting that she has run fever off an on for the past week and now her concern is that her face has turned orange and she would like to be evaluated today.

## 2020-12-20 NOTE — Telephone Encounter (Signed)
Per [employer], he can see pt in [facility] today. Will need labs and possible imaging. Phoned patient and informed of appointment times for this afternoon. Verbalized understanding.

## 2020-12-20 NOTE — ED Notes (Signed)
Patient transported to CT 

## 2020-12-20 NOTE — ED Notes (Signed)
Patient is resting comfortably. Received report from [place], RN

## 2020-12-21 DIAGNOSIS — K8309 Other cholangitis: Secondary | ICD-10-CM | POA: Diagnosis present

## 2020-12-21 LAB — CBC WITH DIFFERENTIAL/PLATELET
Abs Immature Granulocytes: 0.46 10*3/uL — ABNORMAL HIGH (ref 0.00–0.07)
Basophils Absolute: 0 10*3/uL (ref 0.0–0.1)
Basophils Relative: 0 %
Eosinophils Absolute: 0 10*3/uL (ref 0.0–0.5)
Eosinophils Relative: 0 %
HCT: 26.4 % — ABNORMAL LOW (ref 36.0–46.0)
Hemoglobin: 8.5 g/dL — ABNORMAL LOW (ref 12.0–15.0)
Immature Granulocytes: 4 %
Lymphocytes Relative: 10 %
Lymphs Abs: 1 10*3/uL (ref 0.7–4.0)
MCH: 28.9 pg (ref 26.0–34.0)
MCHC: 32.2 g/dL (ref 30.0–36.0)
MCV: 89.8 fL (ref 80.0–100.0)
Monocytes Absolute: 0.5 10*3/uL (ref 0.1–1.0)
Monocytes Relative: 5 %
Neutro Abs: 8.4 10*3/uL — ABNORMAL HIGH (ref 1.7–7.7)
Neutrophils Relative %: 81 %
Platelets: 247 10*3/uL (ref 150–400)
RBC: 2.94 MIL/uL — ABNORMAL LOW (ref 3.87–5.11)
RDW: 17.9 % — ABNORMAL HIGH (ref 11.5–15.5)
WBC: 10.5 10*3/uL (ref 4.0–10.5)
nRBC: 0.4 % — ABNORMAL HIGH (ref 0.0–0.2)

## 2020-12-21 LAB — COMPREHENSIVE METABOLIC PANEL
ALT: 153 U/L — ABNORMAL HIGH (ref 0–44)
AST: 152 U/L — ABNORMAL HIGH (ref 15–41)
Albumin: 1.5 g/dL — ABNORMAL LOW (ref 3.5–5.0)
Alkaline Phosphatase: 1146 U/L — ABNORMAL HIGH (ref 38–126)
Anion gap: 8 (ref 5–15)
BUN: 26 mg/dL — ABNORMAL HIGH (ref 8–23)
CO2: 24 mmol/L (ref 22–32)
Calcium: 8 mg/dL — ABNORMAL LOW (ref 8.9–10.3)
Chloride: 100 mmol/L (ref 98–111)
Creatinine, Ser: 0.41 mg/dL — ABNORMAL LOW (ref 0.44–1.00)
GFR, Estimated: >60 mL/min (ref >60)
Glucose, Bld: 94 mg/dL (ref 70–99)
Potassium: 3.5 mmol/L (ref 3.5–5.1)
Sodium: 132 mmol/L — ABNORMAL LOW (ref 135–145)
Total Bilirubin: 8.2 mg/dL — ABNORMAL HIGH (ref 0.3–1.2)
Total Protein: 5.6 g/dL — ABNORMAL LOW (ref 6.5–8.1)

## 2020-12-21 LAB — AMMONIA: Ammonia: 35 umol/L (ref 9–35)

## 2020-12-21 LAB — PROTIME-INR
INR: 1.2 (ref 0.8–1.2)
Prothrombin Time: 15.4 seconds — ABNORMAL HIGH (ref 11.4–15.2)

## 2020-12-21 LAB — LACTIC ACID, PLASMA: Lactic Acid, Venous: 1.1 mmol/L (ref 0.5–1.9)

## 2020-12-21 LAB — LIPASE, BLOOD: Lipase: 25 U/L (ref 11–51)

## 2020-12-21 MED ORDER — LEVOTHYROXINE SODIUM 50 MCG PO TABS
75.0000 ug | ORAL_TABLET | Freq: Every day | ORAL | Status: DC
  Administered 2020-12-21: 75 ug via ORAL
  Filled 2020-12-21: qty 2

## 2020-12-21 MED ORDER — DEXAMETHASONE 1 MG/ML PO CONC
4.0000 mg | Freq: Two times a day (BID) | ORAL | Status: DC
  Administered 2020-12-21 (×2): 4 mg via ORAL
  Filled 2020-12-21 (×3): qty 4

## 2020-12-21 MED ORDER — PIPERACILLIN-TAZOBACTAM 3.375 G IVPB
3.3750 g | Freq: Three times a day (TID) | INTRAVENOUS | Status: DC
  Administered 2020-12-21: 3.375 g via INTRAVENOUS
  Filled 2020-12-21: qty 50

## 2020-12-21 MED ORDER — DEXAMETHASONE 0.5 MG/5ML PO SOLN
4.0000 mg | Freq: Two times a day (BID) | ORAL | Status: DC
  Filled 2020-12-21: qty 40

## 2020-12-21 MED ORDER — PIPERACILLIN-TAZOBACTAM 3.375 G IVPB 30 MIN
3.3750 g | Freq: Four times a day (QID) | INTRAVENOUS | Status: DC
  Administered 2020-12-21 (×2): 3.375 g via INTRAVENOUS
  Filled 2020-12-21 (×2): qty 50

## 2020-12-21 MED ORDER — GABAPENTIN 300 MG PO CAPS
300.0000 mg | ORAL_CAPSULE | Freq: Every day | ORAL | Status: DC
  Administered 2020-12-21: 300 mg via ORAL
  Filled 2020-12-21: qty 1

## 2020-12-21 MED ORDER — METOCLOPRAMIDE HCL 5 MG/5ML PO SOLN
5.0000 mg | Freq: Three times a day (TID) | ORAL | Status: DC | PRN
  Administered 2020-12-21 (×2): 5 mg via ORAL
  Filled 2020-12-21 (×6): qty 5

## 2020-12-21 MED ORDER — LACTATED RINGERS IV BOLUS
1000.0000 mL | Freq: Once | INTRAVENOUS | Status: AC
  Administered 2020-12-21: 1000 mL via INTRAVENOUS

## 2020-12-21 MED ORDER — ALPRAZOLAM 0.5 MG PO TABS
0.5000 mg | ORAL_TABLET | Freq: Two times a day (BID) | ORAL | Status: DC | PRN
  Administered 2020-12-21: 0.5 mg via ORAL
  Filled 2020-12-21: qty 1

## 2020-12-21 MED ORDER — IBUPROFEN 600 MG PO TABS
600.0000 mg | ORAL_TABLET | Freq: Once | ORAL | Status: AC
  Administered 2020-12-21: 600 mg via ORAL
  Filled 2020-12-21: qty 1

## 2020-12-21 NOTE — ED Notes (Signed)
ED provider spoke with accepting MD at [facility] and patient can eat but must be NPO after midnight. Orders Placed appropriately.

## 2020-12-21 NOTE — Progress Notes (Addendum)
HOSPITAL MEDICINE BRIEF ACCEPTANCE NOTE  65 year old female with past medical history of metastatic ovarian cancer with metastatic disease to the liver, omentum and colon (Dx 06/2020 follows with Dr. Grayland Ormond), anemia, insomnia, previous history of thyroid cancer status post thyroidectomy who presents to [facility] emergency department with fever and jaundice.  Patient has been experiencing a 1 week history of intermittent fevers.  In the past 1 to 2 days patient had developed associated jaundice.  Patient was following up on 6/3 in the hospice symptom management clinic and was noted to be quite jaundiced.  Work-up in that clinic revealed a markedly elevated bilirubin of 9 with markedly elevated alkaline phosphatase.  They contacted patient's oncologist Dr. Grayland Ormond who recommended going to the emergency department.  Upon evaluation in the emergency department patient confirmed to have alkaline phosphatase of 1146, total bilirubin of 8.2.  MRCP was performed revealing marked interval progression of patient's tumor burden with enlargement of the hepatic metastases in particular.  This had resulted in intrahepatic biliary ductal dilatation with periportal edema concerning for cholangitis.  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  ER provider disussed case with multiple providers including Dr. Rush Landmark with GI, Dr. Vernard Gambles with IR and Dr. Megan Salon with GI at [facility].  Overall consensus was that it was unclear whether biliary stenting would help evaluate this patient's symptoms considering her extensive disease.  Regardless, patient is likely suffering from cholangitis due to the presence of ongoing fevers and jaundice in the setting of biliary obstruction.  Patient is been placed on intravenous Zosyn.  Due to lack of availability of a gastroenterologist that can perform ERCP  throughout the weekend, it has been requested that the patient be transferred to [facility] for further management.  Tentative plan is for patient to be transferred to [facility] for continued intravenous antibiotics with GI and possibly even IR consultation.  Palliative care consultation should also be pursued as overall prognosis is quite poor.  Vernelle Emerald  MD [facility]

## 2020-12-21 NOTE — ED Notes (Signed)
Patient is resting comfortably. Call light in reach. Fall precautions in place.  

## 2020-12-21 NOTE — ED Notes (Signed)
Pt accepted to [facility] per [place], call [phone] for report.

## 2020-12-21 NOTE — ED Notes (Signed)
Arnel to PowerShare images to [facility]

## 2020-12-21 NOTE — Progress Notes (Signed)
Patient came to [facility] ED for abd pain and fever, but found of have cholangitis with positive MRCP study. IV ABX started and GI service at [facility] was contacted. This morning, patient has been stable with no fever and her LFTs appear to somewhat trending down, accepted to Tele. NPO after midnight as per GI.

## 2020-12-21 NOTE — ED Notes (Signed)
Patient is resting comfortably. 

## 2020-12-21 NOTE — ED Notes (Signed)
Per Roselyn Reef at [facility], pt "still on the list"

## 2020-12-21 NOTE — ED Notes (Signed)
Patient is resting comfortably. No acute distress noted at this time.

## 2020-12-21 NOTE — ED Notes (Signed)
ED Provider at bedside updating pt on POC.

## 2020-12-21 NOTE — ED Provider Notes (Addendum)
-----------------------------------------   12:44 AM on 12/21/2020 -----------------------------------------  Spoke with Dr. Vernard Gambles from IR who reviewed patient's images and does not feel IR would be able to intervene with percutaneous drain or stents.  Will discuss with GI at tertiary care center to see if they might be able to provide any intervention which we helpful to this patient.  Have updated patient and she prefers I call [facility].   ----------------------------------------- 1:20 AM on 12/21/2020 -----------------------------------------  Have power-shared patient's images to [facility]. Spoke with [facility] GI Dr. Megan Salon who thinks it would be reasonable to transfer patient to see if they may be able to offer intervention but unclear if there would be a procedural intervention possible.  Unfortunately, [facility] is at capacity.  Will call [facility].   ----------------------------------------- 1:23 AM on 12/21/2020 -----------------------------------------  I am told all 3 of [facility] hospitals are at capacity.  Will call [facility] back to place patient on their wait list.  ----------------------------------------- 1:36 AM on 12/21/2020 -----------------------------------------  [facility] tells me there is no official wait list.  Patient will need to be admitted at our facility and to try again for transfer after shift change.   ----------------------------------------- 2:07 AM on 12/21/2020 -----------------------------------------  Spoke with Dr. Sidney Ace from hospitalist services who requests patient be transferred to [facility] as there is no interventional GI on for our facility on the weekends.  Will call [facility] for transfer.   ----------------------------------------- 3:27 AM on 12/21/2020 -----------------------------------------  Patient accepted by Dr. Cyd Silence from [facility] hospitalist service.  [facility] confirms me there are no beds overnight, possibly after shift change.  Will order q6hr IV Zosyn.  Keep patient  n.p.o. in case of procedure later today.  Patient updated and agreeable with plan of care.  It has been over 12 hours since patient last had lab work; will repeat at this time.  Also given her fever and tachycardia, will add blood cultures and lactic acid.   ----------------------------------------- 6:04 AM on 12/21/2020 -----------------------------------------  Lactic acid negative.  LFTs slightly improved from what they were at oncology clinic.  Hemoglobin mildly decreased, may be dilutional as there is no evidence of GI bleed.  Patient resting.   ----------------------------------------- 6:54 AM on 12/21/2020 -----------------------------------------  Patient remains in the ED pending bed availability to transfer to [facility] today.  Care will be transferred to the oncoming provider at shift change.   CRITICAL CARE Performed by: Paulette Blanch   Total critical care time: 45 minutes  Critical care time was exclusive of separately billable procedures and treating other patients.  Critical care was necessary to treat or prevent imminent or life-threatening deterioration.  Critical care was time spent personally by me on the following activities: development of treatment plan with patient and/or surrogate as well as nursing, discussions with consultants, evaluation of patient's response to treatment, examination of patient, obtaining history from patient or surrogate, ordering and performing treatments and interventions, ordering and review of laboratory studies, ordering and review of radiographic studies, pulse oximetry and re-evaluation of patient's condition. Paulette Blanch, MD 12/21/20 [id number]    Paulette Blanch, MD 12/21/20 [phone]

## 2020-12-21 NOTE — ED Notes (Signed)
Patient is resting comfortably. Pt given food and fluids per request. Call light in reach. Fall precautions in place. Patients daughter at bedside.

## 2020-12-21 NOTE — ED Provider Notes (Signed)
-----------------------------------------   1:22 PM on 12/21/2020 -----------------------------------------  The patient remains hemodynamically stable.  She has been hungry but has been n.p.o. today due to the possibility of a procedure.  However, she does not yet have a bed at [facility].  I discussed the case with Dr. Roosevelt Locks the accepting hospitalist as well as with APP Janett Billow from the GI service.  Janett Billow advises that they are not planning to do an ERCP or other procedure today, so we can go ahead and feed the patient and have her be n.p.o. after midnight again if she is still here.   Arta Silence, MD 12/21/20 1323

## 2020-12-21 NOTE — ED Notes (Signed)
Patient refuses to keep on pulse ox or blood pressure cuff

## 2020-12-21 NOTE — ED Notes (Signed)
[facility] to transport to [facility].

## 2020-12-22 ENCOUNTER — Inpatient Hospital Stay (HOSPITAL_COMMUNITY)
Admission: EM | Admit: 2020-12-22 | Discharge: 2020-12-23 | DRG: 445 | Disposition: A | Discharge status: Home / Self Care | Payer: MEDICAID | Source: Other Acute Inpatient Hospital | Attending: Internal Medicine | Admitting: Internal Medicine

## 2020-12-22 ENCOUNTER — Other Ambulatory Visit: Payer: Self-pay

## 2020-12-22 ENCOUNTER — Encounter (HOSPITAL_COMMUNITY): Payer: Self-pay | Admitting: Internal Medicine

## 2020-12-22 DIAGNOSIS — E785 Hyperlipidemia, unspecified: Secondary | ICD-10-CM | POA: Diagnosis present

## 2020-12-22 DIAGNOSIS — C785 Secondary malignant neoplasm of large intestine and rectum: Secondary | ICD-10-CM | POA: Diagnosis present

## 2020-12-22 DIAGNOSIS — R188 Other ascites: Secondary | ICD-10-CM | POA: Diagnosis present

## 2020-12-22 DIAGNOSIS — Z808 Family history of malignant neoplasm of other organs or systems: Secondary | ICD-10-CM

## 2020-12-22 DIAGNOSIS — Z803 Family history of malignant neoplasm of breast: Secondary | ICD-10-CM

## 2020-12-22 DIAGNOSIS — K7689 Other specified diseases of liver: Secondary | ICD-10-CM

## 2020-12-22 DIAGNOSIS — E871 Hypo-osmolality and hyponatremia: Secondary | ICD-10-CM | POA: Diagnosis present

## 2020-12-22 DIAGNOSIS — R64 Cachexia: Secondary | ICD-10-CM | POA: Diagnosis present

## 2020-12-22 DIAGNOSIS — K8309 Other cholangitis: Principal | ICD-10-CM | POA: Diagnosis present

## 2020-12-22 DIAGNOSIS — Z515 Encounter for palliative care: Secondary | ICD-10-CM

## 2020-12-22 DIAGNOSIS — E890 Postprocedural hypothyroidism: Secondary | ICD-10-CM | POA: Diagnosis present

## 2020-12-22 DIAGNOSIS — R7989 Other specified abnormal findings of blood chemistry: Secondary | ICD-10-CM

## 2020-12-22 DIAGNOSIS — D649 Anemia, unspecified: Secondary | ICD-10-CM | POA: Diagnosis present

## 2020-12-22 DIAGNOSIS — Z83438 Family history of other disorder of lipoprotein metabolism and other lipidemia: Secondary | ICD-10-CM

## 2020-12-22 DIAGNOSIS — Z8543 Personal history of malignant neoplasm of ovary: Secondary | ICD-10-CM

## 2020-12-22 DIAGNOSIS — R932 Abnormal findings on diagnostic imaging of liver and biliary tract: Secondary | ICD-10-CM

## 2020-12-22 DIAGNOSIS — C786 Secondary malignant neoplasm of retroperitoneum and peritoneum: Secondary | ICD-10-CM | POA: Diagnosis present

## 2020-12-22 DIAGNOSIS — Z79899 Other long term (current) drug therapy: Secondary | ICD-10-CM

## 2020-12-22 DIAGNOSIS — D638 Anemia in other chronic diseases classified elsewhere: Secondary | ICD-10-CM | POA: Diagnosis present

## 2020-12-22 DIAGNOSIS — E039 Hypothyroidism, unspecified: Secondary | ICD-10-CM | POA: Diagnosis present

## 2020-12-22 DIAGNOSIS — Z85850 Personal history of malignant neoplasm of thyroid: Secondary | ICD-10-CM

## 2020-12-22 DIAGNOSIS — C787 Secondary malignant neoplasm of liver and intrahepatic bile duct: Secondary | ICD-10-CM

## 2020-12-22 DIAGNOSIS — Z66 Do not resuscitate: Secondary | ICD-10-CM

## 2020-12-22 DIAGNOSIS — Z79890 Hormone replacement therapy (postmenopausal): Secondary | ICD-10-CM

## 2020-12-22 DIAGNOSIS — Z801 Family history of malignant neoplasm of trachea, bronchus and lung: Secondary | ICD-10-CM

## 2020-12-22 DIAGNOSIS — Z8261 Family history of arthritis: Secondary | ICD-10-CM

## 2020-12-22 DIAGNOSIS — Z833 Family history of diabetes mellitus: Secondary | ICD-10-CM

## 2020-12-22 DIAGNOSIS — C569 Malignant neoplasm of unspecified ovary: Secondary | ICD-10-CM | POA: Diagnosis present

## 2020-12-22 DIAGNOSIS — Z8249 Family history of ischemic heart disease and other diseases of the circulatory system: Secondary | ICD-10-CM

## 2020-12-22 LAB — CBC
HCT: 22.9 % — ABNORMAL LOW (ref 36.0–46.0)
HCT: 26.1 % — ABNORMAL LOW (ref 36.0–46.0)
Hemoglobin: 7.2 g/dL — ABNORMAL LOW (ref 12.0–15.0)
Hemoglobin: 8.4 g/dL — ABNORMAL LOW (ref 12.0–15.0)
MCH: 29.4 pg (ref 26.0–34.0)
MCH: 29.2 pg (ref 26.0–34.0)
MCHC: 31.4 g/dL (ref 30.0–36.0)
MCHC: 32.2 g/dL (ref 30.0–36.0)
MCV: 93.5 fL (ref 80.0–100.0)
MCV: 90.6 fL (ref 80.0–100.0)
Platelets: 260 10*3/uL (ref 150–400)
Platelets: 275 10*3/uL (ref 150–400)
RBC: 2.45 MIL/uL — ABNORMAL LOW (ref 3.87–5.11)
RBC: 2.88 MIL/uL — ABNORMAL LOW (ref 3.87–5.11)
RDW: 18.1 % — ABNORMAL HIGH (ref 11.5–15.5)
RDW: 18.1 % — ABNORMAL HIGH (ref 11.5–15.5)
WBC: 7 10*3/uL (ref 4.0–10.5)
WBC: 9.6 10*3/uL (ref 4.0–10.5)
nRBC: 0 % (ref 0.0–0.2)
nRBC: 0.5 % — ABNORMAL HIGH (ref 0.0–0.2)

## 2020-12-22 LAB — COMPREHENSIVE METABOLIC PANEL
ALT: 144 U/L — ABNORMAL HIGH (ref 0–44)
AST: 144 U/L — ABNORMAL HIGH (ref 15–41)
Albumin: 1.2 g/dL — ABNORMAL LOW (ref 3.5–5.0)
Alkaline Phosphatase: 1095 U/L — ABNORMAL HIGH (ref 38–126)
Anion gap: 11 (ref 5–15)
BUN: 20 mg/dL (ref 8–23)
CO2: 23 mmol/L (ref 22–32)
Calcium: 7.8 mg/dL — ABNORMAL LOW (ref 8.9–10.3)
Chloride: 98 mmol/L (ref 98–111)
Creatinine, Ser: 0.71 mg/dL (ref 0.44–1.00)
GFR, Estimated: >60 mL/min (ref >60)
Glucose, Bld: 182 mg/dL — ABNORMAL HIGH (ref 70–99)
Potassium: 3.6 mmol/L (ref 3.5–5.1)
Sodium: 132 mmol/L — ABNORMAL LOW (ref 135–145)
Total Bilirubin: 8.2 mg/dL — ABNORMAL HIGH (ref 0.3–1.2)
Total Protein: 4.8 g/dL — ABNORMAL LOW (ref 6.5–8.1)

## 2020-12-22 LAB — ABO/RH: ABO/RH(D): A POS

## 2020-12-22 LAB — TYPE AND SCREEN
ABO/RH(D): A POS
Antibody Screen: NEGATIVE

## 2020-12-22 MED ORDER — SODIUM CHLORIDE 0.9% FLUSH
10.0000 mL | Freq: Two times a day (BID) | INTRAVENOUS | Status: DC
Start: 2020-12-22 — End: 2020-12-23
  Administered 2020-12-22 – 2020-12-23 (×2): 10 mL

## 2020-12-22 MED ORDER — ALPRAZOLAM 0.5 MG PO TABS
0.5000 mg | ORAL_TABLET | Freq: Two times a day (BID) | ORAL | Status: DC | PRN
  Administered 2020-12-22: 0.5 mg via ORAL
  Filled 2020-12-22: qty 1

## 2020-12-22 MED ORDER — CHLORHEXIDINE GLUCONATE CLOTH 2 % EX PADS
6.0000 | MEDICATED_PAD | Freq: Every day | CUTANEOUS | Status: DC
  Administered 2020-12-22: 6 via TOPICAL

## 2020-12-22 MED ORDER — LEVOTHYROXINE SODIUM 75 MCG PO TABS
75.0000 ug | ORAL_TABLET | Freq: Every day | ORAL | Status: DC
  Administered 2020-12-23: 75 ug via ORAL
  Filled 2020-12-22: qty 1

## 2020-12-22 MED ORDER — ONDANSETRON HCL 4 MG PO TABS: 4.0000 mg | ORAL_TABLET | Freq: Four times a day (QID) | ORAL | Status: DC | PRN

## 2020-12-22 MED ORDER — HYDROMORPHONE HCL 1 MG/ML IJ SOLN: 0.5000 mg | INTRAMUSCULAR | Status: DC | PRN

## 2020-12-22 MED ORDER — DEXAMETHASONE 0.5 MG/5ML PO SOLN
4.0000 mg | Freq: Two times a day (BID) | ORAL | Status: DC
  Filled 2020-12-22 (×2): qty 40

## 2020-12-22 MED ORDER — GABAPENTIN 300 MG PO CAPS
300.0000 mg | ORAL_CAPSULE | Freq: Every day | ORAL | Status: DC
  Administered 2020-12-22: 300 mg via ORAL
  Filled 2020-12-22: qty 1

## 2020-12-22 MED ORDER — ONDANSETRON HCL 4 MG/2ML IJ SOLN: 4.0000 mg | Freq: Four times a day (QID) | INTRAMUSCULAR | Status: DC | PRN

## 2020-12-22 MED ORDER — PIPERACILLIN-TAZOBACTAM 3.375 G IVPB
3.3750 g | Freq: Three times a day (TID) | INTRAVENOUS | Status: DC
  Administered 2020-12-22 – 2020-12-23 (×4): 3.375 g via INTRAVENOUS
  Filled 2020-12-22 (×4): qty 50

## 2020-12-22 MED ORDER — METOCLOPRAMIDE HCL 5 MG/5ML PO SOLN
5.0000 mg | Freq: Three times a day (TID) | ORAL | Status: DC
  Administered 2020-12-22 – 2020-12-23 (×2): 5 mg via ORAL
  Filled 2020-12-22 (×4): qty 10

## 2020-12-22 MED ORDER — IBUPROFEN 400 MG PO TABS
400.0000 mg | ORAL_TABLET | Freq: Three times a day (TID) | ORAL | Status: DC | PRN
  Administered 2020-12-22: 400 mg via ORAL
  Filled 2020-12-22: qty 1

## 2020-12-22 MED ORDER — SODIUM CHLORIDE 0.9% FLUSH: 10.0000 mL | INTRAVENOUS | Status: DC | PRN

## 2020-12-22 MED ORDER — DEXTROSE IN LACTATED RINGERS 5 % IV SOLN: INTRAVENOUS | Status: DC

## 2020-12-22 MED ORDER — DEXAMETHASONE 1 MG/ML PO CONC
4.0000 mg | Freq: Two times a day (BID) | ORAL | Status: DC
  Administered 2020-12-22 – 2020-12-23 (×3): 4 mg via ORAL
  Filled 2020-12-22 (×4): qty 4

## 2020-12-22 NOTE — Progress Notes (Signed)
Interventional Radiology Progress Note  Imaging, labs and history reviewed.  I just had a bedside conversation with the patient and her daughter. I explained that there is no role for percutaneous biliary drainage as the MRI/MRCP does not show any current significant biliary ductal dilatation by my review. There is periportal edema that may implicate component of cholangitis.  Main problem is significant worsening of hepatic metastatic disease with roughly quadrupling of the posterior right lobe mass and more than 6-8 times increase in volume of a huge right lobe mass extending into left lobe compared to a CT on 11/04/20. The masses do not appear to be causing significant mass effect on central or extrahepatic bile ducts, however.   Even if she were to develop a more obstructive pattern, she would be considered high risk for any percutaneous biliary procedure given the volume of hepatic metastatic disease and large amount of ascites in peritoneal cavity.   Amanda Pena. Kathlene Cote, M.D Pager:  [phone]

## 2020-12-22 NOTE — Progress Notes (Signed)
PROGRESS NOTE   Amanda Pena  GBT:[id number]    DOB: 06/22/1956    DOA: 12/22/2020  PCP: Cassandria Anger, MD   I have briefly reviewed patients previous medical records in [facility].  Chief complaint: Fever and jaundice.  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 Grayland Ormond in [facility] and at [facility], anemia, prior history of thyroid cancer, post thyroidectomy hypothyroidism, hyperlipidemia, initially presented to [facility] ED with 1 week history of intermittent high fevers and jaundice.  While following up in the hospital symptom management clinic on 6/3, she was noted to be quite jaundiced where work-up revealed markedly elevated bilirubin of 9 and markedly elevated alkaline phosphatase. Dr. Grayland Ormond, oncology recommended ED visit.  In the ED, alkaline phosphatase 1146, total bilirubin 8.2, MRCP showed marked interval progression of patient's tumor burden with enlargement of hepatic metastasis resulting in intrahepatic biliary ductal dilatation with periportal edema concerning for cholangitis.  EDP discussed with multiple providers including [facility], IR and GI at [facility].  She was started on empiric IV Zosyn for suspected cholangitis.  Due to lack of GI services at [facility] to perform ERCP on the weekend, she was transferred to [facility] for possible ERCP versus IR drainage.  [facility] evaluated, no evidence for biliary obstruction by imaging, thus no role for ERCP.  IR evaluated and indicated no role for percutaneous biliary drainage since MRI/MRCP does not show any current significant biliary ductal dilatation.  Palliative care medicine consulted for ongoing goals of care (also recommended by her palliative care team at [facility]).   Assessment & Plan:  Principal Problem:   Acute cholangitis Active Problems:   Hypothyroidism   Hyperlipidemia    Malignant neoplasm of ovary (HCC)   Anemia  Acute cholangitis: As evidenced by high fevers, worsening jaundice and cholestasis from enlarging hepatic metastasis.  Blood cultures x2: Negative to date.  IV Zosyn 6/3 >.  Ongoing low-grade fevers, could be due to cholangitis versus tumor related.  Continue IV antibiotics until tomorrow, then consider discussing with ID regarding choice of oral antibiotics for discharge.  Patient would really like to discharge home  Ovarian cancer metastatic to liver, omentum and colon: Significant worsening of hepatic metastatic disease. [facility] evaluated, no evidence for biliary obstruction by imaging, thus no role for ERCP.  IR evaluated and indicated no role for percutaneous biliary drainage since MRI/MRCP does not show any current significant biliary ductal dilatation.  Palliative care medicine consulted for ongoing goals of care (also recommended by her palliative care team at [facility]).  Impending liver failure.  Extremely poor prognosis.  Resumed soft diet.  Resumed prior home meds including scheduled Reglan and dexamethasone per her request.  Outpatient follow-up with primary oncologist.  Hypothyroidism: Continue levothyroxine.  Hyponatremia:?  Due to SIADH versus hypervolemic hyponatremia.  Mild and stable.  Could consider low-dose Lasix if patient agreeable.  Will discuss tomorrow.  DC IV fluids due to volume overload/ascites and leg edema.  Acute on chronic anemia: Hemoglobin dropped from 10.2 on admission to 7.2 in the absence of overt bleeding.  Some of this could be dilutional.  Follow-up CBC later this evening and in a.m.  Consider transfusion if hemoglobin 7 g or less if patient wishes to pursue aggressive interventions.   There is no height or weight on file to calculate BMI.    DVT prophylaxis: SCDs Start: 12/22/20 0030  Code Status: DNR Family Communication: SCDs Disposition:  Status is: Inpatient  Remains inpatient appropriate  because:Inpatient level of care appropriate due to severity of illness   Dispo: The patient is from: Home              Anticipated d/c is to: Home              Patient currently is not medically stable to d/c.   Difficult to place patient No        Consultants:   [facility] Interventional radiology  Procedures:   None  Antimicrobials:    Anti-infectives (From admission, onward)   Start     Dose/Rate Route Frequency Ordered Stop   12/22/20 0600  piperacillin-tazobactam (ZOSYN) IVPB 3.375 g        3.375 g 12.5 mL/hr over 240 Minutes Intravenous Every 8 hours 12/22/20 0048          Subjective:  Denies complaints.  Hungry.  Has good appetite.  No high fevers recently since hospital admission.  Legs feel heavy/swelling, not relieved by compression stockings at home.  S/p paracentesis several weeks ago but fluid quickly returned but no recent worsening/chronic.  No dyspnea.  No bleeding reported  Objective:   Vitals:   12/22/20 0439 12/22/20 0840 12/22/20 0952 12/22/20 1451  BP: 98/64  120/76 (!) 145/83  Pulse: 73  78 97  Resp: 17  18 18   Temp: 98 F (36.7 C) 97.7 F (36.5 C) (!) 97.3 F (36.3 C) 100 F (37.8 C)  TempSrc:  Oral Oral Oral  SpO2: 97%  99% 97%    General exam: Young female, moderately built and nourished sitting up comfortably in bed.  Scleral and skin icterus. Respiratory system: Clear to auscultation. Respiratory effort normal. Cardiovascular system: S1 & S2 heard, RRR. No JVD, murmurs, rubs, gallops or clicks.  1+ pitting bilateral leg edema. Gastrointestinal system: Abdomen is moderately distended, soft and nontender. No organomegaly or masses felt. Normal bowel sounds heard. Central nervous system: Alert and oriented. No focal neurological deficits.  No asterixis. Extremities: Symmetric 5 x 5 power. Skin: No rashes, lesions or ulcers Psychiatry: Judgement and insight appear normal. Mood & affect appropriate.     Data Reviewed:   I have  personally reviewed following labs and imaging studies   CBC: Recent Labs  Lab 12/20/20 1428 12/21/20 0351 12/22/20 0320  WBC 11.0* 10.5 7.0  NEUTROABS  --  8.4*  --   HGB 10.2* 8.5* 7.2*  HCT 31.7* 26.4* 22.9*  MCV 91.4 89.8 93.5  PLT 292 247 878    Basic Metabolic Panel: Recent Labs  Lab 12/20/20 1428 12/21/20 0351 12/22/20 0320  NA 130* 132* 132*  K 3.5 3.5 3.6  CL 94* 100 98  CO2 22 24 23   GLUCOSE 177* 94 182*  BUN 30* 26* 20  CREATININE 0.65 0.41* 0.71  CALCIUM 8.4* 8.0* 7.8*    Liver Function Tests: Recent Labs  Lab 12/20/20 1428 12/21/20 0351 12/22/20 0320  AST 178* 152* 144*  ALT 202* 153* 144*  ALKPHOS 1,338* 1,146* 1,095*  BILITOT 9.0* 8.2* 8.2*  PROT 7.1 5.6* 4.8*  ALBUMIN 1.9* 1.5* 1.2*    CBG: No results for input(s): GLUCAP in the last 168 hours.  Microbiology Studies:   Recent Results (from the past 240 hour(s))  Resp Panel by RT-PCR (Flu A&B, Covid) Nasopharyngeal Swab     Status: None   Collection Time: 12/20/20  7:32 PM   Specimen: Nasopharyngeal Swab; Nasopharyngeal(NP) swabs  in vial transport medium  Result Value Ref Range Status   SARS Coronavirus 2 by RT PCR NEGATIVE NEGATIVE Final    Comment: (NOTE) SARS-CoV-2 target nucleic acids are NOT DETECTED.  The SARS-CoV-2 RNA is generally detectable in upper respiratory specimens during the acute phase of infection. The lowest concentration of SARS-CoV-2 viral copies this assay can detect is 138 copies/mL. A negative result does not preclude SARS-Cov-2 infection and should not be used as the sole basis for treatment or other patient management decisions. A negative result may occur with  improper specimen collection/handling, submission of specimen other than nasopharyngeal swab, presence of viral mutation(s) within the areas targeted by this assay, and inadequate number of viral copies(<138 copies/mL). A negative result must be combined with clinical observations, patient history,  and epidemiological information. The expected result is Negative.  Fact Sheet for Patients:  EntrepreneurPulse.com.au  Fact Sheet for Healthcare Providers:  IncredibleEmployment.be  This test is no t yet approved or cleared by the [place] FDA and  has been authorized for detection and/or diagnosis of SARS-CoV-2 by FDA under an Emergency Use Authorization (EUA). This EUA will remain  in effect (meaning this test can be used) for the duration of the COVID-19 declaration under Section 564(b)(1) of the Act, 21 U.S.C.section 360bbb-3(b)(1), unless the authorization is terminated  or revoked sooner.       Influenza A by PCR NEGATIVE NEGATIVE Final   Influenza B by PCR NEGATIVE NEGATIVE Final    Comment: (NOTE) The Xpert Xpress SARS-CoV-2/FLU/RSV plus assay is intended as an aid in the diagnosis of influenza from Nasopharyngeal swab specimens and should not be used as a sole basis for treatment. Nasal washings and aspirates are unacceptable for Xpert Xpress SARS-CoV-2/FLU/RSV testing.  Fact Sheet for Patients: EntrepreneurPulse.com.au  Fact Sheet for Healthcare Providers: IncredibleEmployment.be  This test is not yet approved or cleared by the [place] FDA and has been authorized for detection and/or diagnosis of SARS-CoV-2 by FDA under an Emergency Use Authorization (EUA). This EUA will remain in effect (meaning this test can be used) for the duration of the COVID-19 declaration under Section 564(b)(1) of the Act, 21 U.S.C. section 360bbb-3(b)(1), unless the authorization is terminated or revoked.  Performed at [facility], [address] [place], [place] [address]   Culture, blood (routine x 2)     Status: None (Preliminary result)   Collection Time: 12/21/20  3:51 AM   Specimen: BLOOD  Result Value Ref Range Status   Specimen Description BLOOD RIGHT HAND  Final   Special Requests    Final    BOTTLES DRAWN AEROBIC AND ANAEROBIC Blood Culture adequate volume   Culture   Final    NO GROWTH 1 DAY Performed at [facility], [address], [place], [place] [address]    Report Status PENDING  Incomplete  Culture, blood (routine x 2)     Status: None (Preliminary result)   Collection Time: 12/21/20  3:57 AM   Specimen: BLOOD  Result Value Ref Range Status   Specimen Description BLOOD BLOOD RIGHT WRIST  Final   Special Requests   Final    BOTTLES DRAWN AEROBIC AND ANAEROBIC Blood Culture adequate volume   Culture   Final    NO GROWTH 1 DAY Performed at [facility], [address], [place],  [address]    Report Status PENDING  Incomplete     Radiology Studies:  CT ABDOMEN PELVIS WO CONTRAST  Result Date: 12/20/2020 CLINICAL DATA:  New onset jaundice.  Ovarian carcinoma. EXAM: CT ABDOMEN AND PELVIS WITHOUT CONTRAST TECHNIQUE: Multidetector CT imaging of the abdomen and pelvis was performed following the standard protocol without IV contrast. COMPARISON:  11/04/2020 FINDINGS: Lower chest: New small right pleural effusion and right basilar atelectasis. Hepatobiliary: Increased size large low-attenuation masses, largest in the left lobe measuring 11.8 x 7.9 cm, compared to 4.5 x 3.6 cm previously. Pancreas: No mass or inflammatory process visualized on this unenhanced exam. Spleen:  Within normal limits in size. Adrenals/Urinary tract: 2 mm left renal calculus again noted. No evidence of ureteral calculi or hydronephrosis. Unremarkable unopacified urinary bladder. Stomach/Bowel: There has been resolution of small bowel obstruction since previous study. No focal inflammatory process identified. Vascular/Lymphatic: Mild retroperitoneal lymphadenopathy with calcifications again seen mainly in the aorto-caval region, without significant change. Index lymph node measures 10 mm on image 48/2, without change. 1.6 cm low-attenuation lesion with peripheral  calcification again seen in the left perirectal region, without change. No evidence of abdominal aortic aneurysm. Aortic atherosclerotic calcification noted. Reproductive: Prior hysterectomy noted. Large amount of ascites shows no significant change. Other:  None. Musculoskeletal:  No suspicious bone lesions identified. IMPRESSION: Marked increase in size of large low-attenuation liver masses, consistent with metastatic disease. Stable partially calcified mild retroperitoneal lymphadenopathy, consistent with metastatic disease. Large amount of ascites, without significant change. New small right pleural effusion and right basilar atelectasis. Resolution of small bowel obstruction since prior study. Electronically Signed   By: Marlaine Hind M.D.   On: 12/20/2020 18:42   MR 3D Recon At Scanner  Result Date: 12/20/2020 CLINICAL DATA:  New onset jaundice and fevers, ovarian carcinoma. EXAM: MRI ABDOMEN WITHOUT AND WITH CONTRAST (INCLUDING MRCP) TECHNIQUE: Multiplanar multisequence MR imaging of the abdomen was performed both before and after the administration of intravenous contrast. Heavily T2-weighted images of the biliary and pancreatic ducts were obtained, and three-dimensional MRCP images were rendered by post processing. CONTRAST:  7.65mL GADAVIST GADOBUTROL 1 MMOL/ML IV SOLN COMPARISON:  CT 12/20/2020, 11/04/2020 FINDINGS: Lower chest: Small right pleural effusion noted with associated mild right basilar atelectasis. Hepatobiliary: The dominant hepatic metastases noted on remote prior examination have significantly enlarged, as noted on most recent CT exam. Dominant mass within the left hepatic lobe measures 12.1 x 7.5 cm. Dominant mass within the right hepatic lobe protrudes through the liver capsule measuring 7.3 x 5.2 cm. There has developed progressive intrahepatic biliary ductal dilation though the point of obstruction appears to be at the liver hilum, separate from the hepatic metastases. There is  cicatricial change with marked narrowing of the central portal venous structures as well. The central common duct is obliterated, though is decompressed in its mid and distal segment. 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 There is, additionally, superimposed periportal edema, best appreciated within segment 8 of the liver, suggesting changes of superimposed inflammation/cholangitis. The gallbladder is mildly distended. Cholelithiasis again noted. Pancreas:  Unremarkable Spleen:  Unremarkable Adrenals/Urinary Tract: Exophytic hepatics mass demonstrates mass effect upon the right kidney. The kidneys are otherwise unremarkable. The adrenal glands are unremarkable. Stomach/Bowel: Large volume ascites again noted. No evidence of obstruction. Vascular/Lymphatic:  No pathologic adenopathy identified. Other:  None Musculoskeletal: No lytic or blastic bone lesions identified. IMPRESSION: Marked interval progression of disease with enlargement of the dominant hepatic metastases. Interval development of multifocal intrahepatic biliary ductal dilation with areas of a periportal edema in keeping with cholangitis best appreciated within the hepatic dome.  The central ducts in the region of the biliary hilum appear obliterated, though adjacent mass is not identified and there is no significant associated mass effect from the known hepatic metastases suggesting that this represents an intrinsic inflammatory process, such as a secondary, drug induced sclerosing cholangitis. Large volume ascites, similar to prior examination. Small right pleural effusion. Electronically Signed   By: Fidela Salisbury MD   On: 12/20/2020 22:55   MR ABDOMEN MRCP W WO CONTAST  Result Date: 12/20/2020 CLINICAL DATA:  New onset jaundice and fevers, ovarian carcinoma. EXAM: MRI ABDOMEN WITHOUT AND WITH CONTRAST (INCLUDING MRCP) TECHNIQUE: Multiplanar multisequence MR  imaging of the abdomen was performed both before and after the administration of intravenous contrast. Heavily T2-weighted images of the biliary and pancreatic ducts were obtained, and three-dimensional MRCP images were rendered by post processing. CONTRAST:  7.37mL GADAVIST GADOBUTROL 1 MMOL/ML IV SOLN COMPARISON:  CT 12/20/2020, 11/04/2020 FINDINGS: Lower chest: Small right pleural effusion noted with associated mild right basilar atelectasis. Hepatobiliary: The dominant hepatic metastases noted on remote prior examination have significantly enlarged, as noted on most recent CT exam. Dominant mass within the left hepatic lobe measures 12.1 x 7.5 cm. Dominant mass within the right hepatic lobe protrudes through the liver capsule measuring 7.3 x 5.2 cm. There has developed progressive intrahepatic biliary ductal dilation though the point of obstruction appears to be at the liver hilum, separate from the hepatic metastases. There is cicatricial change with marked narrowing of the central portal venous structures as well. The central common duct is obliterated, though is decompressed in its mid and distal segment. 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 There is, additionally, superimposed periportal edema, best appreciated within segment 8 of the liver, suggesting changes of superimposed inflammation/cholangitis. The gallbladder is mildly distended. Cholelithiasis again noted. Pancreas:  Unremarkable Spleen:  Unremarkable Adrenals/Urinary Tract: Exophytic hepatics mass demonstrates mass effect upon the right kidney. The kidneys are otherwise unremarkable. The adrenal glands are unremarkable. Stomach/Bowel: Large volume ascites again noted. No evidence of obstruction. Vascular/Lymphatic:  No pathologic adenopathy identified. Other:  None Musculoskeletal: No lytic or blastic bone lesions identified. IMPRESSION: Marked interval progression  of disease with enlargement of the dominant hepatic metastases. Interval development of multifocal intrahepatic biliary ductal dilation with areas of a periportal edema in keeping with cholangitis best appreciated within the hepatic dome. The central ducts in the region of the biliary hilum appear obliterated, though adjacent mass is not identified and there is no significant associated mass effect from the known hepatic metastases suggesting that this represents an intrinsic inflammatory process, such as a secondary, drug induced sclerosing cholangitis. Large volume ascites, similar to prior examination. Small right pleural effusion. Electronically Signed   By: Fidela Salisbury MD   On: 12/20/2020 22:55     Scheduled Meds:   . Chlorhexidine Gluconate Cloth  6 each Topical Daily  . dexamethasone  4 mg Oral Q12H  . gabapentin  300 mg Oral QHS  . [START ON 12/23/2020] levothyroxine  75 mcg Oral QAC breakfast  . metoCLOPramide  5 mg Oral TID AC  . sodium chloride flush  10-40 mL Intracatheter Q12H    Continuous Infusions:   . dextrose 5% lactated ringers 100 mL/hr at 12/22/20 1138  . piperacillin-tazobactam (ZOSYN)  IV 3.375 g (12/22/20 1353)     LOS: 0 days     Vernell Leep, MD, [place], [facility]. [facility]    To contact the attending  provider between 7A-7P or the covering provider during after hours 7P-7A, please log into the web site [email or web address] and access using universal [place] password for that web site. If you do not have the password, please call the hospital operator.  12/22/2020, 3:18 PM

## 2020-12-22 NOTE — Progress Notes (Signed)
IR consulted by Azucena Freed, PA-C for possible image-guided biliary drain placement.  Case/images have been reviewed by both Dr. Kathlene Cote and Dr. Vernard Gambles who state no role for biliary drainage at this time- patient without evidence of biliary obstruction on MR, has massive liver masses and ascites. Unfortunately recommend palliative care consult at this time. No plans for IR intervention- will delete order. Janett Billow, PA-C made aware.  IR available in future if needed.   Bea Graff Shyniece Scripter, PA-C 12/22/2020, 11:22 AM

## 2020-12-22 NOTE — Progress Notes (Signed)
Patient arrived to 6N09 from [facility] ED via [place]. Alert and oriented x4. Vital signs stable. Denies c/o pain. Oriented to room and remote. Admitting MD paged for admission orders.

## 2020-12-22 NOTE — H&P (Signed)
History and Physical   Amanda Pena FHL:[id number] DOB: 1955/10/19 DOA: 12/22/2020  Referring MD/NP/PA: Dr. Eula Listen, [place] [facility]  PCP: Cassandria Anger, MD   Outpatient Specialists: Dr. Grayland Ormond, oncology  Patient coming from: [place] [facility] emergency room  Chief Complaint: Abdominal pain  HPI: Amanda Pena is a 65 y.o. 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  Patient being followed by hospice care at this point.  She had worsening bilirubin on labs yesterday up to 9.  Dr. Grayland Ormond was consulted who recommended patient go to the ER.  In the ER she was noted to have markedly elevated LFTs.  Alkaline phosphatase as high as 1146.  The total bilirubin is at 8.2.  Symptoms consistent with obstructive jaundice.  Patient had MRCP performed that showed intrahepatic biliary ductal dilatation and worsening hepatic metastasis.  There is periportal edema concerning for cholangitis.  Also possibility of drug-induced sclerosing cholangitis was considered.  Gastroenterology was consulted at [facility] as well as [place].  Consensus is patient may require ERCP with possible biliary stenting for symptom control.  Patient therefore transferred to: To have that done as there is no GI available at this site.  Patient has arrived now and is being admitted to our service...  ED Course: Temperature is 102.9 blood pressure 147/93, pulse 120 respirate 20 oxygen sat 93% on room air.  White count 10.5 hemoglobin 8.5 platelets 247.  Sodium is 132 potassium 3.5 chloride 100 CO2 24 BUN 26 creatinine 0.41 calcium 8.0.  PT 15.4 INR 1.2.  COVID-19 and viral acid negative.  MRCP  result as described above.  Blood cultures obtained and patient is transferred here for possible ERCP with stenting.  Review of Systems: As per HPI otherwise 10 point review of systems negative.    Past Medical History:  Diagnosis Date  . Cancer ([place])   . Family history of breast cancer   . Family history of lung cancer   . HYPERLIPIDEMIA 04/09/2008   Qualifier: Diagnosis of  By: Wynona Luna   . HYPOTHYROIDISM 04/09/2008   Qualifier: Diagnosis of  By: Wynona Luna   . INSOMNIA, CHRONIC 08/21/2010   Qualifier: Diagnosis of  By: Wynona Luna   . 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 IR IMAGING GUIDED PORT INSERTION  07/18/2019  . THYROIDECTOMY, PARTIAL       reports that she has never smoked. She has never used smokeless tobacco. She reports previous alcohol use. She reports that she does not use drugs.  No Known Allergies  Family History  Problem Relation Age of Onset  . Arthritis Mother   . Hyperlipidemia Mother   . Hyperlipidemia Father   . Diabetes Father   . Hypertension Father   . Heart disease Father 5       CAD  . Lung cancer Father   . Breast cancer Maternal Grandmother        in 92s  . Cancer Maternal Aunt        unk type  . Cancer Maternal Uncle  unk type  . Cancer Paternal Uncle        unk type, d. 37s  . Skin cancer Daughter        basal cell      Prior to Admission medications   Medication Sig Start Date End Date Taking? Authorizing Provider  ALPRAZolam Duanne Moron) 0.5 MG tablet Take 1 tablet (0.5 mg total) by mouth 2 (two) times daily as needed for anxiety. 03/14/20   Plotnikov, [name], MD  dexamethasone (DECADRON) 0.5 MG/5ML solution Take 40 mLs (4 mg total) by mouth 2 (two) times daily. 11/27/20   Verlon Au, NP  gabapentin (NEURONTIN) 300 MG capsule Take 1 capsule (300 mg total) by mouth 2 (two) times  daily. Patient taking differently: Take 300 mg by mouth at bedtime. 12/11/20   Lloyd Huger, MD  metoCLOPramide (REGLAN) 5 MG/5ML solution TAKE 5 ML by mouth THREE TIMES DAILY. INCREASE TO 10 ML IF TOLERATING. DISCONTINUE IF ABDOMINAL CRAMPING. 11/27/20   Verlon Au, NP  SYNTHROID 75 MCG tablet Take 1 tablet (75 mcg total) by mouth daily before breakfast. 09/09/20   Plotnikov, [name], MD  rosuvastatin (CRESTOR) 5 MG tablet Take 1 tablet (5 mg total) by mouth daily. 05/17/19 05/17/19  Plotnikov, [name], MD    Physical Exam: There were no vitals filed for this visit.    Constitutional: Chronically ill looking, cachectic, no distress There were no vitals filed for this visit. Eyes: PERRL, lids and conjunctivae normal ENMT: Mucous membranes are moist. Posterior pharynx clear of any exudate or lesions.Normal dentition.  Neck: normal, supple, no masses, no thyromegaly Respiratory: Coarse breath sounds bilaterally with R[place]  no wheezing, no crackles. Normal respiratory effort. No accessory muscle use.  Cardiovascular: Sinus tachycardia, no murmurs / rubs / gallops. No extremity edema. 2+ pedal pulses. No carotid bruits.  Abdomen: Distended, mildly tender, possible organomegaly bowel sounds positive.  Musculoskeletal: no clubbing / cyanosis. No joint deformity upper and lower extremities. Good ROM, no contractures. Normal muscle tone.  Skin: no rashes, lesions, ulcers. No induration Neurologic: CN 2-12 grossly intact. Sensation intact, DTR normal. Strength 5/5 in all 4.  Psychiatric: Normal judgment and insight. Alert and oriented x 3.  Depressed mood.     Labs on Admission: I have personally reviewed following labs and imaging studies  CBC: Recent Labs  Lab 12/20/20 1428 12/21/20 0351  WBC 11.0* 10.5  NEUTROABS  --  8.4*  HGB 10.2* 8.5*  HCT 31.7* 26.4*  MCV 91.4 89.8  PLT 292 109   Basic Metabolic Panel: Recent Labs  Lab 12/20/20 1428 12/21/20 0351  NA 130*  132*  K 3.5 3.5  CL 94* 100  CO2 22 24  GLUCOSE 177* 94  BUN 30* 26*  CREATININE 0.65 0.41*  CALCIUM 8.4* 8.0*   GFR: Estimated Creatinine Clearance: 93.5 mL/min (A) (by C-G formula based on SCr of 0.41 mg/dL (L)). Liver Function Tests: Recent Labs  Lab 12/20/20 1428 12/21/20 0351  AST 178* 152*  ALT 202* 153*  ALKPHOS 1,338* 1,146*  BILITOT 9.0* 8.2*  PROT 7.1 5.6*  ALBUMIN 1.9* 1.5*   Recent Labs  Lab 12/21/20 0351  LIPASE 25   Recent Labs  Lab 12/21/20 0351  AMMONIA 35   Coagulation Profile: Recent Labs  Lab 12/21/20 0023  INR 1.2   Cardiac Enzymes: No results for input(s): CKTOTAL, CKMB, CKMBINDEX, TROPONINI in the last 168 hours. BNP (last 3 results) No results for input(s): PROBNP in the last 8760 hours. HbA1C:  No results for input(s): HGBA1C in the last 72 hours. CBG: No results for input(s): GLUCAP in the last 168 hours. Lipid Profile: No results for input(s): CHOL, HDL, LDLCALC, TRIG, CHOLHDL, LDLDIRECT in the last 72 hours. Thyroid Function Tests: No results for input(s): TSH, T4TOTAL, FREET4, T3FREE, THYROIDAB in the last 72 hours. Anemia Panel: No results for input(s): VITAMINB12, FOLATE, FERRITIN, TIBC, IRON, RETICCTPCT in the last 72 hours. Urine analysis:    Component Value Date/Time   COLORURINE AMBER (A) 12/11/2020 0930   APPEARANCEUR CLEAR (A) 12/11/2020 0930   LABSPEC 1.018 12/11/2020 0930   PHURINE 5.0 12/11/2020 0930   GLUCOSEU NEGATIVE 12/11/2020 0930   GLUCOSEU NEGATIVE 06/13/2019 1517   HGBUR NEGATIVE 12/11/2020 0930   BILIRUBINUR NEGATIVE 12/11/2020 0930   KETONESUR NEGATIVE 12/11/2020 0930   PROTEINUR NEGATIVE 12/11/2020 0930   UROBILINOGEN 1.0 06/13/2019 1517   NITRITE NEGATIVE 12/11/2020 0930   LEUKOCYTESUR NEGATIVE 12/11/2020 0930   Sepsis Labs: @LABRCNTIP (procalcitonin:4,lacticidven:4) ) Recent Results (from the past 240 hour(s))  Resp Panel by RT-PCR (Flu A&B, Covid) Nasopharyngeal Swab     Status: None    Collection Time: 12/20/20  7:32 PM   Specimen: Nasopharyngeal Swab; Nasopharyngeal(NP) swabs in vial transport medium  Result Value Ref Range Status   SARS Coronavirus 2 by RT PCR NEGATIVE NEGATIVE Final    Comment: (NOTE) SARS-CoV-2 target nucleic acids are NOT DETECTED.  The SARS-CoV-2 RNA is generally detectable in upper respiratory specimens during the acute phase of infection. The lowest concentration of SARS-CoV-2 viral copies this assay can detect is 138 copies/mL. A negative result does not preclude SARS-Cov-2 infection and should not be used as the sole basis for treatment or other patient management decisions. A negative result may occur with  improper specimen collection/handling, submission of specimen other than nasopharyngeal swab, presence of viral mutation(s) within the areas targeted by this assay, and inadequate number of viral copies(<138 copies/mL). A negative result must be combined with clinical observations, patient history, and epidemiological information. The expected result is Negative.  Fact Sheet for Patients:  EntrepreneurPulse.com.au  Fact Sheet for Healthcare Providers:  IncredibleEmployment.be  This test is no t yet approved or cleared by the [place] FDA and  has been authorized for detection and/or diagnosis of SARS-CoV-2 by FDA under an Emergency Use Authorization (EUA). This EUA will remain  in effect (meaning this test can be used) for the duration of the COVID-19 declaration under Section 564(b)(1) of the Act, 21 U.S.C.section 360bbb-3(b)(1), unless the authorization is terminated  or revoked sooner.       Influenza A by PCR NEGATIVE NEGATIVE Final   Influenza B by PCR NEGATIVE NEGATIVE Final    Comment: (NOTE) The Xpert Xpress SARS-CoV-2/FLU/RSV plus assay is intended as an aid in the diagnosis of influenza from Nasopharyngeal swab specimens and should not be used as a sole basis for treatment.  Nasal washings and aspirates are unacceptable for Xpert Xpress SARS-CoV-2/FLU/RSV testing.  Fact Sheet for Patients: EntrepreneurPulse.com.au  Fact Sheet for Healthcare Providers: IncredibleEmployment.be  This test is not yet approved or cleared by the [place] FDA and has been authorized for detection and/or diagnosis of SARS-CoV-2 by FDA under an Emergency Use Authorization (EUA). This EUA will remain in effect (meaning this test can be used) for the duration of the COVID-19 declaration under Section 564(b)(1) of the Act, 21 U.S.C. section 360bbb-3(b)(1), unless the authorization is terminated or revoked.  Performed at [facility], [address] [place], [place] [address]  Culture, blood (routine x 2)     Status: None (Preliminary result)   Collection Time: 12/21/20  3:51 AM   Specimen: BLOOD  Result Value Ref Range Status   Specimen Description BLOOD RIGHT HAND  Final   Special Requests   Final    BOTTLES DRAWN AEROBIC AND ANAEROBIC Blood Culture adequate volume   Culture   Final    NO GROWTH < 12 HOURS Performed at [facility], [address], [place], [place] [address]    Report Status PENDING  Incomplete  Culture, blood (routine x 2)     Status: None (Preliminary result)   Collection Time: 12/21/20  3:57 AM   Specimen: BLOOD  Result Value Ref Range Status   Specimen Description BLOOD BLOOD RIGHT WRIST  Final   Special Requests   Final    BOTTLES DRAWN AEROBIC AND ANAEROBIC Blood Culture adequate volume   Culture   Final    NO GROWTH < 12 HOURS Performed at [facility], [address] [place], [place] [address]    Report Status PENDING  Incomplete     Radiological Exams on Admission: CT ABDOMEN PELVIS WO CONTRAST  Result Date: 12/20/2020 CLINICAL DATA:  New onset jaundice.  Ovarian carcinoma. EXAM: CT ABDOMEN AND PELVIS WITHOUT CONTRAST TECHNIQUE: Multidetector CT imaging of the abdomen  and pelvis was performed following the standard protocol without IV contrast. COMPARISON:  11/04/2020 FINDINGS: Lower chest: New small right pleural effusion and right basilar atelectasis. Hepatobiliary: Increased size large low-attenuation masses, largest in the left lobe measuring 11.8 x 7.9 cm, compared to 4.5 x 3.6 cm previously. Pancreas: No mass or inflammatory process visualized on this unenhanced exam. Spleen:  Within normal limits in size. Adrenals/Urinary tract: 2 mm left renal calculus again noted. No evidence of ureteral calculi or hydronephrosis. Unremarkable unopacified urinary bladder. Stomach/Bowel: There has been resolution of small bowel obstruction since previous study. No focal inflammatory process identified. Vascular/Lymphatic: Mild retroperitoneal lymphadenopathy with calcifications again seen mainly in the aorto-caval region, without significant change. Index lymph node measures 10 mm on image 48/2, without change. 1.6 cm low-attenuation lesion with peripheral calcification again seen in the left perirectal region, without change. No evidence of abdominal aortic aneurysm. Aortic atherosclerotic calcification noted. Reproductive: Prior hysterectomy noted. Large amount of ascites shows no significant change. Other:  None. Musculoskeletal:  No suspicious bone lesions identified. IMPRESSION: Marked increase in size of large low-attenuation liver masses, consistent with metastatic disease. Stable partially calcified mild retroperitoneal lymphadenopathy, consistent with metastatic disease. Large amount of ascites, without significant change. New small right pleural effusion and right basilar atelectasis. Resolution of small bowel obstruction since prior study. Electronically Signed   By: Marlaine Hind M.D.   On: 12/20/2020 18:42   MR 3D Recon At Scanner  Result Date: 12/20/2020 CLINICAL DATA:  New onset jaundice and fevers, ovarian carcinoma. EXAM: MRI ABDOMEN WITHOUT AND WITH CONTRAST (INCLUDING  MRCP) TECHNIQUE: Multiplanar multisequence MR imaging of the abdomen was performed both before and after the administration of intravenous contrast. Heavily T2-weighted images of the biliary and pancreatic ducts were obtained, and three-dimensional MRCP images were rendered by post processing. CONTRAST:  7.33mL GADAVIST GADOBUTROL 1 MMOL/ML IV SOLN COMPARISON:  CT 12/20/2020, 11/04/2020 FINDINGS: Lower chest: Small right pleural effusion noted with associated mild right basilar atelectasis. Hepatobiliary: The dominant hepatic metastases noted on remote prior examination have significantly enlarged, as noted on most recent CT exam. Dominant mass within the left hepatic lobe measures 12.1 x 7.5 cm.  Dominant mass within the right hepatic lobe protrudes through the liver capsule measuring 7.3 x 5.2 cm. There has developed progressive intrahepatic biliary ductal dilation though the point of obstruction appears to be at the liver hilum, separate from the hepatic metastases. There is cicatricial change with marked narrowing of the central portal venous structures as well. The central common duct is obliterated, though is decompressed in its mid and distal segment. 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 There is, additionally, superimposed periportal edema, best appreciated within segment 8 of the liver, suggesting changes of superimposed inflammation/cholangitis. The gallbladder is mildly distended. Cholelithiasis again noted. Pancreas:  Unremarkable Spleen:  Unremarkable Adrenals/Urinary Tract: Exophytic hepatics mass demonstrates mass effect upon the right kidney. The kidneys are otherwise unremarkable. The adrenal glands are unremarkable. Stomach/Bowel: Large volume ascites again noted. No evidence of obstruction. Vascular/Lymphatic:  No pathologic adenopathy identified. Other:  None Musculoskeletal: No lytic or blastic bone lesions  identified. IMPRESSION: Marked interval progression of disease with enlargement of the dominant hepatic metastases. Interval development of multifocal intrahepatic biliary ductal dilation with areas of a periportal edema in keeping with cholangitis best appreciated within the hepatic dome. The central ducts in the region of the biliary hilum appear obliterated, though adjacent mass is not identified and there is no significant associated mass effect from the known hepatic metastases suggesting that this represents an intrinsic inflammatory process, such as a secondary, drug induced sclerosing cholangitis. Large volume ascites, similar to prior examination. Small right pleural effusion. Electronically Signed   By: Fidela Salisbury MD   On: 12/20/2020 22:55   MR ABDOMEN MRCP W WO CONTAST  Result Date: 12/20/2020 CLINICAL DATA:  New onset jaundice and fevers, ovarian carcinoma. EXAM: MRI ABDOMEN WITHOUT AND WITH CONTRAST (INCLUDING MRCP) TECHNIQUE: Multiplanar multisequence MR imaging of the abdomen was performed both before and after the administration of intravenous contrast. Heavily T2-weighted images of the biliary and pancreatic ducts were obtained, and three-dimensional MRCP images were rendered by post processing. CONTRAST:  7.66mL GADAVIST GADOBUTROL 1 MMOL/ML IV SOLN COMPARISON:  CT 12/20/2020, 11/04/2020 FINDINGS: Lower chest: Small right pleural effusion noted with associated mild right basilar atelectasis. Hepatobiliary: The dominant hepatic metastases noted on remote prior examination have significantly enlarged, as noted on most recent CT exam. Dominant mass within the left hepatic lobe measures 12.1 x 7.5 cm. Dominant mass within the right hepatic lobe protrudes through the liver capsule measuring 7.3 x 5.2 cm. There has developed progressive intrahepatic biliary ductal dilation though the point of obstruction appears to be at the liver hilum, separate from the hepatic metastases. There is cicatricial  change with marked narrowing of the central portal venous structures as well. The central common duct is obliterated, though is decompressed in its mid and distal segment. 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 There is, additionally, superimposed periportal edema, best appreciated within segment 8 of the liver, suggesting changes of superimposed inflammation/cholangitis. The gallbladder is mildly distended. Cholelithiasis again noted. Pancreas:  Unremarkable Spleen:  Unremarkable Adrenals/Urinary Tract: Exophytic hepatics mass demonstrates mass effect upon the right kidney. The kidneys are otherwise unremarkable. The adrenal glands are unremarkable. Stomach/Bowel: Large volume ascites again noted. No evidence of obstruction. Vascular/Lymphatic:  No pathologic adenopathy identified. Other:  None Musculoskeletal: No lytic or blastic bone lesions identified. IMPRESSION: Marked interval progression of disease with enlargement of the dominant hepatic metastases. Interval development of multifocal  intrahepatic biliary ductal dilation with areas of a periportal edema in keeping with cholangitis best appreciated within the hepatic dome. The central ducts in the region of the biliary hilum appear obliterated, though adjacent mass is not identified and there is no significant associated mass effect from the known hepatic metastases suggesting that this represents an intrinsic inflammatory process, such as a secondary, drug induced sclerosing cholangitis. Large volume ascites, similar to prior examination. Small right pleural effusion. Electronically Signed   By: Fidela Salisbury MD   On: 12/20/2020 22:55      Assessment/Plan Principal Problem:   Acute cholangitis Active Problems:   Hypothyroidism   Hyperlipidemia   Malignant neoplasm of ovary ([place])   Anemia     #1 acute cholangitis: Differentials include drug-induced sclerosing  cholangitis.  Patient was admitted.  IV Zosyn initiated.  Pain control.  Supportive care.  GI consult for ERCP in the morning.  Will keep patient n.p.o.  Hydrate patient.  #2 malignant ovarian cancer: Patient having significant pain.  At this point she has advanced cancer.  Palliative care with hospice recommended.  Following up with oncologist.  Will defer to oncology.  #3 hypothyroidism: Continue levothyroxine  #4 hyperlipidemia: Hold any statins especially with LFTs elevation at this point.  #5 anemia of chronic disease: Continue to monitor H&H.  No transfusion required at this point.  #6 hyponatremia: Probably due to third spacing and liver involvement.  Hydrate and monitor.   DVT prophylaxis: SCD Code Status: DNR Family Communication: No family at bedside Disposition Plan: To be determined Consults called: Dr Allison Quarry  Admission status: Inpatient  Severity of Illness: The appropriate patient status for this patient is INPATIENT. Inpatient status is judged to be reasonable and necessary in order to provide the required intensity of service to ensure the patient's safety. The patient's presenting symptoms, physical exam findings, and initial radiographic and laboratory data in the context of their chronic comorbidities is felt to place them at high risk for further clinical deterioration. Furthermore, it is not anticipated that the patient will be medically stable for discharge from the hospital within 2 midnights of admission. The following factors support the patient status of inpatient.   " The patient's presenting symptoms include abdominal pain and worsening jaundice. " The worrisome physical exam findings include significant jaundice cachexia. " The initial radiographic and laboratory data are worrisome because of elevated LFTs. " The chronic co-morbidities include metastatic ovarian cancer.   * I certify that at the point of admission it is my clinical judgment that the  patient will require inpatient hospital care spanning beyond 2 midnights from the point of admission due to high intensity of service, high risk for further deterioration and high frequency of surveillance required.Barbette Merino MD [facility] Pager [phone]  If 7PM-7AM, please contact night-coverage [email or web address] Password [facility]  12/22/2020, 12:33 AM

## 2020-12-22 NOTE — Consult Note (Signed)
Consultation Note Date: 12/22/2020   Patient Name: Amanda Pena  DOB: 26-Oct-1955  MRN: [id number]  Age / Sex: 65 y.o., female  PCP: Plotnikov, [name], MD Referring Physician: Modena Jansky, MD  Reason for Consultation: Establishing goals of care and Psychosocial/spiritual support  HPI/Patient Profile: 65 y.o. 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    Need for ERCP with biliary stenting and consideration and patient was transferred to [facility].  MRCP shows marked progression of disease with enlarging hepatic metastases.  There is intrahepatic biliary ductal dilation and findings suspicious for cholangitis.  Suspect also maybe a component of liver failure due to tumor burden.    Dr. Kathlene Cote at bedside this afternoon shared with patient and her daughter the main problem is significant worsening of hepatic metastatic disease with roughly quadrupling of the posterior right lobe mass and more than 6-8 times increase in volume of a huge right lobe mass extending into the left lobe compared to a CT on 11/04/2020.  The masses do not appear to be causing significant mass-effect on central or extrahepatic bile ducts.  However even if she were to develop a more obstructive pattern she would be considered high risk for any percutaneous biliary procedure given the volume of hepatic metastatic disease and large amount of ascites and peritoneal cavity.  Patient and family face treatment option decisions, advanced directive decisions and anticipatory care  needs.   Clinical Assessment and Goals of Care:  This NP Wadie Lessen reviewed medical records, received report from team, assessed the patient and then meet at the patient's bedside along with her daughter/Morgan Danford to discuss diagnosis, prognosis, GOC, EOL wishes disposition and options.   Concept of Palliative Care was introduced as specialized medical care for people and their families living with serious illness.  If focuses on providing relief from the symptoms and stress of a serious illness.  The goal is to improve quality of life for both the patient and the family.  Patient is familiar with palliative medicine, she has worked with Billey Chang NP at [facility] center.  Created space and opportunity for patient  and family to explore thoughts and feelings regarding current medical situation.  Patient verbalizes confusion and frustration around her transfer here to [facility] when actually they do not have anything more to offer than  if she had stayed at [facility].  She also verbalizes that along her disease trajectory she has been told that she is dying and "only has a few weeks to live" and then bounces back.  Patient had hospice services in the past but currently has revoked those benefits.  It is difficult grasping the seriousness of her current medical situation.  Patient has  multiple healthcare providers at various locations including [place], [facility] and [facility]..  It will be important to coordinate with her treatment team any decisions moving forward and transitions of care.  Patient understands the difference between a aggressive medical intervention path  and a palliative comfort care path.  At this time patient verbalizes her desire to explore viable treatment options to treat her cancer.  She hopes to speak with Dr. Finnegan/oncology at [facility] and [employer]  NP before making decisions regarding transitions of care.    I spoke with Billey Chang  NP by telephone.  He plans to speak with Dr. Maryjane Hurter in the morning along with Beckey Rutter GYN/ONC  NP in hopes of coordinating care in the next 24 to 48 hours  Questions and concerns addressed.    Patient  encouraged to call with questions or concerns.    PMT will continue to support holistically.    No noted documented H POA or advanced care planning documents.  Will discuss with patient tomorrow   Patient's healthcare has been managed by " [facility]"   SUMMARY OF RECOMMENDATIONS    Code Status/Advance Care Planning:  DNR   Symptom Management:   Pain: Dilaudid 0.5-1 mg IV every 2 hrs prn  Anxiety; Xanax 0.5 mg po bid prn   Palliative Prophylaxis:   Bowel Regimen, Delirium Protocol, Frequent Pain Assessment and Oral Care  Additional Recommendations (Limitations, Scope, Preferences):  Full Scope Treatment  Psycho-social/Spiritual:   Additional Recommendations: Education on Hospice  Prognosis:   < 3 months-likely, will depend on desire for life prolonging measures.  Discharge Planning:  Home with home health versus hospice     Primary Diagnoses: Present on Admission: . Malignant neoplasm of ovary ([place]) . Acute cholangitis . Hyperlipidemia . Hypothyroidism . Anemia   I have reviewed the medical record, interviewed the patient and family, and examined the patient. The following aspects are pertinent.  Past Medical History:  Diagnosis Date  . Cancer ([place])   . Family history of breast cancer   . Family history of lung cancer   . HYPERLIPIDEMIA 04/09/2008   Qualifier: Diagnosis of  By: Wynona Luna   . HYPOTHYROIDISM 04/09/2008   Qualifier: Diagnosis of  By: Wynona Luna   . INSOMNIA, CHRONIC 08/21/2010   Qualifier: Diagnosis of  By: Wynona Luna   . PERSONAL HISTORY MALIGNANT NEOPLASM THYROID 08/21/2010   Qualifier: Diagnosis of  By: Wynona Luna    Social History   Socioeconomic History  . Marital status: Married    Spouse  name: Not on file  . Number of children: Not on file  . Years of education: Not on file  . Highest education level: Not on file  Occupational History  . Not on file  Tobacco Use  . Smoking status: Never Smoker  . Smokeless tobacco: Never Used  Vaping Use  . Vaping Use: Never used  Substance and Sexual Activity  . Alcohol use: Not Currently  . Drug use: No  . Sexual activity: Yes  Other Topics Concern  . 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 Arthritis Mother   . Hyperlipidemia Mother   . Hyperlipidemia Father   . Diabetes Father   . Hypertension Father   . Heart disease Father 33       CAD  . Lung cancer Father   . Breast cancer Maternal Grandmother        in 29s  . Cancer Maternal Aunt  unk type  . Cancer Maternal Uncle        unk type  . Cancer Paternal Uncle        unk type, d. 75s  . Skin cancer Daughter        basal cell    Scheduled Meds: . Chlorhexidine Gluconate Cloth  6 each Topical Daily  . dexamethasone  4 mg Oral BID  . gabapentin  300 mg Oral QHS  . [START ON 12/23/2020] levothyroxine  75 mcg Oral QAC breakfast  . metoCLOPramide  5 mg Oral TID AC  . sodium chloride flush  10-40 mL Intracatheter Q12H   Continuous Infusions: . dextrose 5% lactated ringers 100 mL/hr at 12/22/20 1138  . piperacillin-tazobactam (ZOSYN)  IV 3.375 g (12/22/20 3244)   PRN Meds:.ALPRAZolam, HYDROmorphone (DILAUDID) injection, ondansetron **OR** ondansetron (ZOFRAN) IV, sodium chloride flush Medications Prior to Admission:  Prior to Admission medications    Medication Sig Start Date End Date Taking? Authorizing Provider  ALPRAZolam Duanne Moron) 0.5 MG tablet Take 1 tablet (0.5 mg total) by mouth 2 (two) times daily as needed for anxiety. 03/14/20   Plotnikov, [name], MD  dexamethasone (DECADRON) 0.5 MG/5ML solution Take 40 mLs (4 mg total) by mouth 2 (two) times daily. 11/27/20   Verlon Au, NP  gabapentin (NEURONTIN) 300 MG capsule Take 1 capsule (300 mg total) by mouth 2 (two) times daily. Patient taking differently: Take 300 mg by mouth at bedtime. 12/11/20   Lloyd Huger, MD  metoCLOPramide (REGLAN) 5 MG/5ML solution TAKE 5 ML by mouth THREE TIMES DAILY. INCREASE TO 10 ML IF TOLERATING. DISCONTINUE IF ABDOMINAL CRAMPING. 11/27/20   Verlon Au, NP  SYNTHROID 75 MCG tablet Take 1 tablet (75 mcg total) by mouth daily before breakfast. 09/09/20   Plotnikov, [name], MD  rosuvastatin (CRESTOR) 5 MG tablet Take 1 tablet (5 mg total) by mouth daily. 05/17/19 05/17/19  Plotnikov, [name], MD   No Known Allergies Review of Systems  Skin: Positive for color change.  Neurological: Positive for weakness.    Physical Exam Constitutional:      Appearance: She is underweight. She is ill-appearing.  Cardiovascular:     Rate and Rhythm: Normal rate.  Pulmonary:     Effort: Pulmonary effort is normal.  Abdominal:     General: There is distension.  Skin:    Coloration: Skin is jaundiced.  Neurological:     Mental Status: She is alert and oriented to person, place, and time.     Vital Signs: BP 120/76 (BP Location: Left Arm)   Pulse 78   Temp (!) 97.3 F (36.3 C) (Oral)   Resp 18   SpO2 99%  Pain Scale: 0-10   Pain Score: 0-No pain   SpO2: SpO2: 99 % O2 Device:SpO2: 99 % O2 Flow Rate: .   IO: Intake/output summary:   Intake/Output Summary (Last 24 hours) at 12/22/2020 1349 Last data filed at 12/22/2020 1000 Gross per 24 hour  Intake 10 ml  Output --  Net 10 ml    LBM: Last BM Date: 12/21/20 Baseline Weight:   Most  recent weight:       Palliative Assessment/Data: 50 % currently   Discussed with Dr Kathlene Cote and Dr. Algis Liming  Time In: 1300 Time Out: 1410 Time Total: 70 minutes Greater than 50%  of this time was spent counseling and coordinating care related to the above assessment and plan.  Signed by: Wadie Lessen, NP   Please contact Palliative Medicine  Team phone at [phone] for questions and concerns.  For individual provider: See Shea Evans

## 2020-12-22 NOTE — Progress Notes (Signed)
Pharmacy Antibiotic Note  Amanda Pena is a 65 y.o. female admitted on 12/22/2020 with cholangitis.  Pharmacy has been consulted for Zosyn dosing. Transfer from [facility]. WBC 10.5. Renal function good.   Plan: Zosyn 3.375G IV q8h to be infused over 4 hours Trend WBC, temp, renal function  F/U infectious work-up     Temp (24hrs), Avg:98.2 F (36.8 C), Min:97.5 F (36.4 C), Max:99.2 F (37.3 C)  Recent Labs  Lab 12/20/20 1428 12/21/20 0351  WBC 11.0* 10.5  CREATININE 0.65 0.41*  LATICACIDVEN  --  1.1    Estimated Creatinine Clearance: 93.5 mL/min (A) (by C-G formula based on SCr of 0.41 mg/dL (L)).    No Known Allergies  Narda Bonds, PharmD, BCPS Clinical Pharmacist Phone: [phone]

## 2020-12-22 NOTE — Plan of Care (Signed)
  Problem: Clinical Measurements: Goal: Ability to maintain clinical measurements within normal limits will improve Outcome: Progressing Goal: Will remain free from infection Outcome: Progressing Goal: Diagnostic test results will improve Outcome: Progressing Goal: Cardiovascular complication will be avoided Outcome: Progressing   

## 2020-12-22 NOTE — Plan of Care (Signed)
  Problem: Education: Goal: Knowledge of General Education information will improve Description: Including pain rating scale, medication(s)/side effects and non-pharmacologic comfort measures Outcome: Progressing   Problem: Health Behavior/Discharge Planning: Goal: Ability to manage health-related needs will improve Outcome: Progressing   Problem: Clinical Measurements: Goal: Ability to maintain clinical measurements within normal limits will improve Outcome: Progressing Goal: Respiratory complications will improve Outcome: Progressing Goal: Cardiovascular complication will be avoided Outcome: Progressing   Problem: Activity: Goal: Risk for activity intolerance will decrease Outcome: Progressing   Problem: Coping: Goal: Level of anxiety will decrease Outcome: Progressing   Problem: Pain Managment: Goal: General experience of comfort will improve Outcome: Progressing   Problem: Safety: Goal: Ability to remain free from injury will improve Outcome: Progressing   Problem: Skin Integrity: Goal: Risk for impaired skin integrity will decrease Outcome: Progressing

## 2020-12-22 NOTE — Plan of Care (Signed)
  Problem: Education: Goal: Knowledge of General Education information will improve Description Including pain rating scale, medication(s)/side effects and non-pharmacologic comfort measures Outcome: Progressing   

## 2020-12-22 NOTE — Consult Note (Addendum)
Referring Provider:  Dr. Algis Liming, [facility] Primary Care Physician:  Cassandria Anger, MD Primary Gastroenterologist:  Dr. Deatra Ina  Reason for Consultation:  Elevated LFTs and biliary obstruction  HPI: Amanda Pena is a 65 y.o. 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  She is NOT having abdominal pain.  Patient being followed by hospice care at this point.  She had elevated LFTs on labs and Dr. Grayland Ormond, her oncologist, was consulted who recommended patient go to the ER.  In the ER she was noted to have markedly elevated LFTs.  Alkaline phosphatase as high as 1146.  The total bilirubin is at 9.0.  Symptoms consistent with obstructive jaundice.  Patient had MRCP performed that showed intrahepatic biliary ductal dilatation and worsening hepatic metastasis.  There is periportal edema concerning for cholangitis.  She is on Zosyn.  She was transferred to [facility] hospital for GI or IR intervention.   Past Medical History:  Diagnosis Date  . Cancer ([place])   . Family history of breast cancer   . Family history of lung cancer   . HYPERLIPIDEMIA 04/09/2008   Qualifier: Diagnosis of  By: Wynona Luna   . HYPOTHYROIDISM 04/09/2008   Qualifier: Diagnosis of  By: Wynona Luna   . INSOMNIA, CHRONIC 08/21/2010   Qualifier: Diagnosis of  By: Wynona Luna   . 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 IR IMAGING GUIDED PORT INSERTION  07/18/2019  . THYROIDECTOMY, PARTIAL      Prior to Admission medications   Medication Sig Start Date End Date  Taking? Authorizing Provider  ALPRAZolam Duanne Moron) 0.5 MG tablet Take 1 tablet (0.5 mg total) by mouth 2 (two) times daily as needed for anxiety. 03/14/20   Plotnikov, [name], MD  dexamethasone (DECADRON) 0.5 MG/5ML solution Take 40 mLs (4 mg total) by mouth 2 (two) times daily. 11/27/20   Verlon Au, NP  gabapentin (NEURONTIN) 300 MG capsule Take 1 capsule (300 mg total) by mouth 2 (two) times daily. Patient taking differently: Take 300 mg by mouth at bedtime. 12/11/20   Lloyd Huger, MD  metoCLOPramide (REGLAN) 5 MG/5ML solution TAKE 5 ML by mouth THREE TIMES DAILY. INCREASE TO 10 ML IF TOLERATING. DISCONTINUE IF ABDOMINAL CRAMPING. 11/27/20   Verlon Au, NP  SYNTHROID 75 MCG tablet Take 1 tablet (75 mcg total) by mouth daily before breakfast. 09/09/20   Plotnikov, [name], MD  rosuvastatin (CRESTOR) 5 MG tablet Take 1 tablet (5 mg total) by mouth daily. 05/17/19 05/17/19  Plotnikov, [name], MD    Current Facility-Administered Medications  Medication Dose Route Frequency Provider Last Rate Last Admin  . Chlorhexidine Gluconate Cloth 2 % PADS 6 each  6 each Topical Daily Garba, Mohammad L, MD      . dextrose 5 % in lactated ringers infusion   Intravenous Continuous Elwyn Reach, MD 100 mL/hr at 12/22/20 0116 New Bag at 12/22/20 0116  . HYDROmorphone (DILAUDID) injection 0.5-1 mg  0.5-1 mg Intravenous Q2H PRN Elwyn Reach, MD      . ondansetron [facility])  tablet 4 mg  4 mg Oral Q6H PRN Elwyn Reach, MD       Or  . ondansetron (ZOFRAN) injection 4 mg  4 mg Intravenous Q6H PRN Gala Romney L, MD      . piperacillin-tazobactam (ZOSYN) IVPB 3.375 g  3.375 g Intravenous Q8H Erenest Blank, RPH 12.5 mL/hr at 12/22/20 0614 3.375 g at 12/22/20 0614  . sodium chloride flush (NS) 0.9 % injection 10-40 mL  10-40 mL Intracatheter Q12H Garba, Mohammad L, MD      . sodium chloride flush (NS) 0.9 % injection 10-40 mL  10-40 mL Intracatheter PRN Elwyn Reach, MD         Allergies as of 12/21/2020  . (No Known Allergies)    Family History  Problem Relation Age of Onset  . Arthritis Mother   . Hyperlipidemia Mother   . Hyperlipidemia Father   . Diabetes Father   . Hypertension Father   . Heart disease Father 84       CAD  . Lung cancer Father   . Breast cancer Maternal Grandmother        in 42s  . Cancer Maternal Aunt        unk type  . Cancer Maternal Uncle        unk type  . Cancer Paternal Uncle        unk type, d. 3s  . Skin cancer Daughter        basal cell     Social History   Socioeconomic History  . Marital status: Married    Spouse name: Not on file  . Number of children: Not on file  . Years of education: Not on file  . Highest education level: Not on file  Occupational History  . Not on file  Tobacco Use  . Smoking status: Never Smoker  . Smokeless tobacco: Never Used  Vaping Use  . Vaping Use: Never used  Substance and Sexual Activity  . Alcohol use: Not Currently  . Drug use: No  . Sexual activity: Yes  Other Topics Concern  . 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  Physical Exam: Vital signs in last 24 hours: Temp:  [97.5 F (36.4 C)-98.5 F (36.9 C)] 98 F (36.7 C) (06/05 0439) Pulse Rate:  [69-90] 73 (06/05 0439) Resp:  [17-18] 17 (06/05 0439) BP: (98-116)/(64-88) 98/64 (06/05 0439) SpO2:  [97 %-100 %] 97 % (06/05 0439) Last BM Date: 12/21/20 General:  Alert, appears well other than being jaundice, pleasant and  cooperative in NAD Head:  Normocephalic and atraumatic. Eyes:  Scleral icterus noted. Ears:  Normal auditory acuity. Mouth:  No deformity or lesions.   Lungs:  Clear throughout to auscultation.  No wheezes, crackles, or rhonchi.  Heart:  Regular rate and rhythm; no murmurs, clicks, rubs, or gallops. Abdomen:  Soft, distended with ascites fluid.  BS present.  Non-tender. Msk:  Symmetrical without gross deformities. Pulses:  Normal pulses noted. Extremities:  1-2+ pitting edema noted in B/L LEs. Neurologic:  Alert and oriented x4;  grossly normal neurologically. Skin:  Intact without significant lesions or rashes. Psych:  Alert and cooperative. Normal mood and affect.  Lab Results: Recent Labs    12/20/20 1428 12/21/20 0351 12/22/20 0320  WBC 11.0* 10.5 7.0  HGB 10.2* 8.5* 7.2*  HCT 31.7* 26.4* 22.9*  PLT 292 247 260   BMET Recent Labs    12/20/20 1428 12/21/20 0351 12/22/20 0320  NA 130* 132* 132*  K 3.5 3.5 3.6  CL 94* 100 98  CO2 22 24 23   GLUCOSE 177* 94 182*  BUN 30* 26* 20  CREATININE 0.65 0.41* 0.71  CALCIUM 8.4* 8.0* 7.8*   LFT Recent Labs    12/22/20 0320  PROT 4.8*  ALBUMIN 1.2*  AST 144*  ALT 144*  ALKPHOS 1,095*  BILITOT 8.2*   PT/INR Recent Labs    12/21/20 0023  LABPROT 15.4*  INR 1.2   Studies/Results: CT ABDOMEN PELVIS WO CONTRAST  Result Date: 12/20/2020 CLINICAL DATA:  New onset jaundice.  Ovarian carcinoma. EXAM: CT ABDOMEN AND PELVIS WITHOUT CONTRAST TECHNIQUE: Multidetector CT imaging of the abdomen and pelvis was performed following the standard protocol without IV contrast. COMPARISON:  11/04/2020 FINDINGS: Lower chest: New small right pleural effusion and right basilar atelectasis. Hepatobiliary: Increased size large low-attenuation masses, largest in the left lobe measuring 11.8 x 7.9 cm, compared to 4.5 x 3.6 cm previously. Pancreas: No mass or inflammatory process visualized on this unenhanced exam. Spleen:  Within normal limits in  size. Adrenals/Urinary tract: 2 mm left renal calculus again noted. No evidence of ureteral calculi or hydronephrosis. Unremarkable unopacified urinary bladder. Stomach/Bowel: There has been resolution of small bowel obstruction since previous study. No focal inflammatory process identified. Vascular/Lymphatic: Mild retroperitoneal lymphadenopathy with calcifications again seen mainly in the aorto-caval region, without significant change. Index lymph node measures 10 mm on image 48/2, without change. 1.6 cm low-attenuation lesion with peripheral calcification again seen in the left perirectal region, without change. No evidence of abdominal aortic aneurysm. Aortic atherosclerotic calcification noted. Reproductive: Prior hysterectomy noted. Large amount of ascites shows no significant change. Other:  None. Musculoskeletal:  No suspicious bone lesions identified. IMPRESSION: Marked increase in size of large low-attenuation liver masses, consistent with metastatic disease. Stable partially calcified mild retroperitoneal lymphadenopathy, consistent with metastatic disease. Large amount of ascites, without significant change. New small right pleural effusion and right basilar atelectasis. Resolution of small bowel obstruction since prior study. Electronically Signed   By: Marlaine Hind M.D.   On: 12/20/2020 18:42   MR 3D Recon At Scanner  Result Date: 12/20/2020 CLINICAL DATA:  New onset jaundice and fevers, ovarian carcinoma. EXAM: MRI ABDOMEN WITHOUT AND WITH CONTRAST (INCLUDING MRCP) TECHNIQUE: Multiplanar multisequence MR imaging of the abdomen was performed both before and after the administration of intravenous contrast. Heavily T2-weighted images of the biliary and pancreatic ducts were obtained, and three-dimensional MRCP images were rendered by post processing. CONTRAST:  7.29mL GADAVIST GADOBUTROL 1 MMOL/ML IV SOLN COMPARISON:  CT 12/20/2020, 11/04/2020 FINDINGS: Lower chest: Small right pleural effusion noted  with associated mild right basilar atelectasis. Hepatobiliary: The dominant hepatic metastases noted on remote prior examination have significantly enlarged, as noted on most recent CT exam. Dominant mass within the left hepatic lobe measures 12.1 x 7.5 cm. Dominant mass within the right hepatic lobe protrudes through the liver capsule measuring 7.3 x 5.2 cm. There has developed progressive intrahepatic biliary ductal dilation though the  point of obstruction appears to be at the liver hilum, separate from the hepatic metastases. There is cicatricial change with marked narrowing of the central portal venous structures as well. The central common duct is obliterated, though is decompressed in its mid and distal segment. 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 There is, additionally, superimposed periportal edema, best appreciated within segment 8 of the liver, suggesting changes of superimposed inflammation/cholangitis. The gallbladder is mildly distended. Cholelithiasis again noted. Pancreas:  Unremarkable Spleen:  Unremarkable Adrenals/Urinary Tract: Exophytic hepatics mass demonstrates mass effect upon the right kidney. The kidneys are otherwise unremarkable. The adrenal glands are unremarkable. Stomach/Bowel: Large volume ascites again noted. No evidence of obstruction. Vascular/Lymphatic:  No pathologic adenopathy identified. Other:  None Musculoskeletal: No lytic or blastic bone lesions identified. IMPRESSION: Marked interval progression of disease with enlargement of the dominant hepatic metastases. Interval development of multifocal intrahepatic biliary ductal dilation with areas of a periportal edema in keeping with cholangitis best appreciated within the hepatic dome. The central ducts in the region of the biliary hilum appear obliterated, though adjacent mass is not identified and there is no significant associated mass  effect from the known hepatic metastases suggesting that this represents an intrinsic inflammatory process, such as a secondary, drug induced sclerosing cholangitis. Large volume ascites, similar to prior examination. Small right pleural effusion. Electronically Signed   By: Fidela Salisbury MD   On: 12/20/2020 22:55   MR ABDOMEN MRCP W WO CONTAST  Result Date: 12/20/2020 CLINICAL DATA:  New onset jaundice and fevers, ovarian carcinoma. EXAM: MRI ABDOMEN WITHOUT AND WITH CONTRAST (INCLUDING MRCP) TECHNIQUE: Multiplanar multisequence MR imaging of the abdomen was performed both before and after the administration of intravenous contrast. Heavily T2-weighted images of the biliary and pancreatic ducts were obtained, and three-dimensional MRCP images were rendered by post processing. CONTRAST:  7.44mL GADAVIST GADOBUTROL 1 MMOL/ML IV SOLN COMPARISON:  CT 12/20/2020, 11/04/2020 FINDINGS: Lower chest: Small right pleural effusion noted with associated mild right basilar atelectasis. Hepatobiliary: The dominant hepatic metastases noted on remote prior examination have significantly enlarged, as noted on most recent CT exam. Dominant mass within the left hepatic lobe measures 12.1 x 7.5 cm. Dominant mass within the right hepatic lobe protrudes through the liver capsule measuring 7.3 x 5.2 cm. There has developed progressive intrahepatic biliary ductal dilation though the point of obstruction appears to be at the liver hilum, separate from the hepatic metastases. There is cicatricial change with marked narrowing of the central portal venous structures as well. The central common duct is obliterated, though is decompressed in its mid and distal segment. 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 There is, additionally, superimposed periportal edema, best appreciated within segment 8 of the liver, suggesting changes of superimposed  inflammation/cholangitis. The gallbladder is mildly distended. Cholelithiasis again noted. Pancreas:  Unremarkable Spleen:  Unremarkable Adrenals/Urinary Tract: Exophytic hepatics mass demonstrates mass effect upon the right kidney. The kidneys are otherwise unremarkable. The adrenal glands are unremarkable. Stomach/Bowel: Large volume ascites again noted. No evidence of obstruction. Vascular/Lymphatic:  No pathologic adenopathy identified. Other:  None Musculoskeletal: No lytic or blastic bone lesions identified. IMPRESSION: Marked interval progression of disease with enlargement of the dominant hepatic metastases. Interval development of multifocal intrahepatic biliary ductal dilation with areas of a periportal edema in keeping with cholangitis best appreciated within the hepatic dome. The central ducts in the region of  the biliary hilum appear obliterated, though adjacent mass is not identified and there is no significant associated mass effect from the known hepatic metastases suggesting that this represents an intrinsic inflammatory process, such as a secondary, drug induced sclerosing cholangitis. Large volume ascites, similar to prior examination. Small right pleural effusion. Electronically Signed   By: Fidela Salisbury MD   On: 12/20/2020 22:55   IMPRESSION:  *65 year old female with metastatic ovarian cancer to the liver and omentum who is presenting with intermittent fevers, weakness, and worsening jaundice.  LFTs significantly elevated compared to one month ago.  MRCP shows marked progression of disease with enlarging hepatic metastases.  There is intrahepatic biliary ductal dilation and findings suspicious for cholangitis.  Suspect also maybe a component of liver failure due to tumor burden.  She is currently on hospice care.  PLAN: -Continue abx (on Zosyn). -Consult IR for consideration of drain. -No role for ERCP in this situation with current MRCP findings as her biliary dilation is all  intrahepatic. -Would make oncology aware that she is here. -Will allow a soft diet for now then NPO after midnight, anticipating that IR will not perform procedure today. -Ok for lasix prn for LE edema per the hospitalist team.   Laban Emperor. Zehr  12/22/2020, 9:25 AM  GI ATTENDING  History, laboratories, x-rays reviewed.  Agree with comprehensive consultation note as outlined above.  Unfortunate 65 year old woman with metastatic ovarian cancer.  She now appears to have large volume metastatic disease replacing the liver with associated liver test abnormalities and likely impending liver failure.  We are asked to see regarding possible biliary obstruction and the need for ERCP for decompression.  There is no evidence for biliary obstruction by imaging, thus no role for ERCP.  She was evaluated by IR as well who does not find any intrahepatic obstructive areas for drainage.  The prognosis is extremely poor.  Please notify oncology as a courtesy.  Agree with palliative care assessment.  We will sign off.  Docia Chuck. Geri Seminole., M.D. [facility] Division of Gastroenterology

## 2020-12-23 ENCOUNTER — Other Ambulatory Visit (HOSPITAL_COMMUNITY): Payer: Self-pay

## 2020-12-23 DIAGNOSIS — C569 Malignant neoplasm of unspecified ovary: Secondary | ICD-10-CM

## 2020-12-23 LAB — COMPREHENSIVE METABOLIC PANEL
ALT: 186 U/L — ABNORMAL HIGH (ref 0–44)
AST: 190 U/L — ABNORMAL HIGH (ref 15–41)
Albumin: 1.1 g/dL — ABNORMAL LOW (ref 3.5–5.0)
Alkaline Phosphatase: 1314 U/L — ABNORMAL HIGH (ref 38–126)
Anion gap: 9 (ref 5–15)
BUN: 22 mg/dL (ref 8–23)
CO2: 25 mmol/L (ref 22–32)
Calcium: 7.9 mg/dL — ABNORMAL LOW (ref 8.9–10.3)
Chloride: 98 mmol/L (ref 98–111)
Creatinine, Ser: 0.79 mg/dL (ref 0.44–1.00)
GFR, Estimated: >60 mL/min (ref >60)
Glucose, Bld: 147 mg/dL — ABNORMAL HIGH (ref 70–99)
Potassium: 3.8 mmol/L (ref 3.5–5.1)
Sodium: 132 mmol/L — ABNORMAL LOW (ref 135–145)
Total Bilirubin: 8.6 mg/dL — ABNORMAL HIGH (ref 0.3–1.2)
Total Protein: 4.8 g/dL — ABNORMAL LOW (ref 6.5–8.1)

## 2020-12-23 LAB — CBC
HCT: 24.5 % — ABNORMAL LOW (ref 36.0–46.0)
Hemoglobin: 7.7 g/dL — ABNORMAL LOW (ref 12.0–15.0)
MCH: 29.3 pg (ref 26.0–34.0)
MCHC: 31.4 g/dL (ref 30.0–36.0)
MCV: 93.2 fL (ref 80.0–100.0)
Platelets: 241 10*3/uL (ref 150–400)
RBC: 2.63 MIL/uL — ABNORMAL LOW (ref 3.87–5.11)
RDW: 18.4 % — ABNORMAL HIGH (ref 11.5–15.5)
WBC: 6.7 10*3/uL (ref 4.0–10.5)
nRBC: 0.3 % — ABNORMAL HIGH (ref 0.0–0.2)

## 2020-12-23 MED ORDER — GABAPENTIN 300 MG PO CAPS: 300.0000 mg | ORAL_CAPSULE | Freq: Every day | ORAL | Status: AC

## 2020-12-23 MED ORDER — HEPARIN SOD (PORK) LOCK FLUSH 100 UNIT/ML IV SOLN
500.0000 [IU] | INTRAVENOUS | Status: AC | PRN
  Administered 2020-12-23: 500 [IU]
  Filled 2020-12-23: qty 5

## 2020-12-23 MED ORDER — FUROSEMIDE 20 MG PO TABS
20.0000 mg | ORAL_TABLET | Freq: Every day | ORAL | 0 refills | Status: AC
  Filled 2020-12-23: qty 30, 30d supply, fill #0

## 2020-12-23 MED ORDER — METOCLOPRAMIDE HCL 5 MG/5ML PO SOLN: 5.0000 mg | Freq: Three times a day (TID) | ORAL | Status: AC | PRN

## 2020-12-23 MED ORDER — AMOXICILLIN-POT CLAVULANATE 875-125 MG PO TABS
1.0000 | ORAL_TABLET | Freq: Two times a day (BID) | ORAL | 0 refills | Status: AC
  Filled 2020-12-23: qty 10, 5d supply, fill #0

## 2020-12-23 MED ORDER — IBUPROFEN 200 MG PO TABS: 200.0000 mg | ORAL_TABLET | Freq: Three times a day (TID) | ORAL | Status: AC | PRN

## 2020-12-23 NOTE — Discharge Instructions (Signed)

## 2020-12-23 NOTE — Discharge Summary (Signed)
Physician Discharge Summary  Amanda Pena OFB:[id number] DOB: [birth year]  PCP: Cassandria Anger, MD  Admitted from: Home Discharged to: Home  Admit date: 12/22/2020 Discharge date: 12/23/2020  Recommendations for Outpatient Follow-up:    Follow-up Information    Plotnikov, Amanda Lacks, MD. Schedule an appointment as soon as possible for a visit.   Specialty: Internal Medicine Contact information: [place] [address] [phone]              As communicated with the [facility] Oncology team, patient is scheduled to see Dr. Grayland Ormond, Dr. Theora Gianotti (GYN Oncology) and Altha Harm, NP with palliative care team on 12/25/2020 morning.   Home Health: None    Equipment/Devices: None    Discharge Condition: Stable but overall prognosis is very poor.   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 Grayland Ormond in [facility] and at [facility], anemia, prior history of thyroid cancer, post thyroidectomy hypothyroidism, hyperlipidemia, initially presented to [facility] ED with 1 week history of intermittent high fevers and jaundice.  While following up in the hospital symptom management clinic on 6/3, she was noted to be quite jaundiced where work-up revealed markedly elevated bilirubin of 9 and markedly elevated alkaline phosphatase. Dr. Grayland Ormond, oncology recommended ED visit.  In the ED, alkaline phosphatase 1146, total bilirubin 8.2, MRCP showed marked interval progression of patient's tumor burden  with enlargement of hepatic metastasis resulting in intrahepatic biliary ductal dilatation with periportal edema concerning for cholangitis.  EDP discussed with multiple providers including [facility], IR and GI at [facility].  She was started on empiric IV Zosyn for suspected cholangitis.  Due to lack of GI services at [facility] to perform ERCP on the weekend, she was transferred to [facility] for possible ERCP versus IR drainage.  [facility] evaluated, no evidence for biliary obstruction by imaging, thus no role for ERCP.  IR evaluated and indicated no role for percutaneous biliary drainage since MRI/MRCP does not show any current significant biliary ductal dilatation.  Palliative care medicine consulted for ongoing goals of care (also recommended by her palliative care team at [facility]).   Assessment & Plan:   Acute cholangitis: As evidenced by high fevers, worsening jaundice and cholestasis from enlarging hepatic metastasis.  Blood cultures x2: Negative to date.  IV Zosyn 6/3 >.  Ongoing low-grade fevers, could be due to cholangitis versus tumor related.  Patient really wishes to discharge home so she can follow-up with her medical oncology team at [facility].  Apart from intermittent chills and low-grade fevers, she has no other complaints.  She has a great appetite-attributing it to steroids and no abdominal pain.  I reviewed her case with infectious disease MD on-call and as per recommendations will discharge on Augmentin x5 days to complete course.  Given severe LFT abnormalities from extensive hepatic meta stasis, advised patient to avoid acetaminophen for fever, may use low-dose ibuprofen for limited time.  She verbalizes understanding.  Labs i.e.  CBC and CMP can be followed by the [facility] oncology team as deemed necessary.  Ovarian cancer metastatic to liver, omentum and colon: Significant worsening of hepatic metastatic disease. [facility] evaluated, no evidence for biliary obstruction by imaging, thus no role for ERCP.  IR  evaluated and indicated no role for percutaneous biliary drainage since MRI/MRCP does not show any current significant biliary ductal dilatation.  Palliative care medicine consulted for ongoing goals of care (also recommended by her palliative care team at [facility]).  Impending liver failure.  Extremely poor prognosis.  Resumed soft diet and tolerating same.  Resumed prior home meds including scheduled Reglan and dexamethasone per her request.  Outpatient follow-up with primary oncologist.  As stated above, I communicated with her [facility] medical oncology and palliative care team.  They have cleared her for discharge from their standpoint and are scheduled to see her on 6/8 morning including medical and GYN oncology and palliative care medicine.  They indicate that she is hospice appropriate but do not think that she will be agreeable until she sees them in clinic.  Started low-dose Lasix 20 Mg daily for bilateral lower extremity edema which can be titrated as needed as outpatient.  Alkaline phosphatase, AST and ALT which had gone down mildly have worsened again to levels as they were on 6/3.  Hypothyroidism: Continue levothyroxine.  Hyponatremia:?  Due to SIADH versus hypervolemic hyponatremia.  Mild and stable.    Acute on chronic anemia: Hemoglobin dropped from 10.2 on admission to 7.2 in the absence of overt bleeding.  Some of this could be dilutional.    Hemoglobin fluctuating but stable.  Hb 7.2 > 8.4 > 7.7.  Follow-up labs with oncology as outpatient.      Consultants:   [facility] Interventional radiology  Procedures:   None   Discharge Instructions  Discharge Instructions    Call MD for:   Complete by: As directed    Worsening jaundice.   Call MD for:  difficulty breathing, headache or visual disturbances   Complete by: As directed    Call MD for:  extreme fatigue   Complete by: As directed    Call MD for:  persistant dizziness or light-headedness   Complete by: As directed     Call MD for:  persistant nausea and vomiting   Complete by: As directed    Call MD for:  severe uncontrolled pain   Complete by: As directed    Call MD for:  temperature >100.4   Complete by: As directed    Diet general   Complete by: As directed    Increase activity slowly   Complete by: As directed        Medication List    TAKE these medications   ALPRAZolam 0.5 MG tablet Commonly known as: XANAX Take 1 tablet (0.5 mg total) by mouth 2 (two) times daily as needed for anxiety.   amoxicillin-clavulanate 875-125 MG tablet Commonly known as: Augmentin Take 1 tablet by mouth 2 (two) times daily for 5 days.   dexamethasone 0.5 MG/5ML solution Commonly known as: DECADRON Take 40 mLs (4 mg total) by mouth 2 (two) times daily.   furosemide 20 MG tablet Commonly known as: Lasix Take 1 tablet (20 mg total) by mouth daily.   gabapentin 300 MG capsule Commonly known as: NEURONTIN Take 1 capsule (300 mg total) by mouth at bedtime.   ibuprofen 200 MG tablet Commonly known as: ADVIL Take 1-2 tablets (200-400 mg total) by mouth every 8 (  eight) hours as needed for fever. For 3-5 days.   metoCLOPramide 5 MG/5ML solution Commonly known as: REGLAN Take 5 mLs (5 mg total) by mouth 3 (three) times daily as needed for nausea or vomiting.   Synthroid 75 MCG tablet Generic drug: levothyroxine Take 1 tablet (75 mcg total) by mouth daily before breakfast.      No Known Allergies    Procedures/Studies: CT ABDOMEN PELVIS WO CONTRAST  Result Date: 12/20/2020 CLINICAL DATA:  New onset jaundice.  Ovarian carcinoma. EXAM: CT ABDOMEN AND PELVIS WITHOUT CONTRAST TECHNIQUE: Multidetector CT imaging of the abdomen and pelvis was performed following the standard protocol without IV contrast. COMPARISON:  11/04/2020 FINDINGS: Lower chest: New small right pleural effusion and right basilar atelectasis. Hepatobiliary: Increased size large low-attenuation masses, largest in the left lobe measuring  11.8 x 7.9 cm, compared to 4.5 x 3.6 cm previously. Pancreas: No mass or inflammatory process visualized on this unenhanced exam. Spleen:  Within normal limits in size. Adrenals/Urinary tract: 2 mm left renal calculus again noted. No evidence of ureteral calculi or hydronephrosis. Unremarkable unopacified urinary bladder. Stomach/Bowel: There has been resolution of small bowel obstruction since previous study. No focal inflammatory process identified. Vascular/Lymphatic: Mild retroperitoneal lymphadenopathy with calcifications again seen mainly in the aorto-caval region, without significant change. Index lymph node measures 10 mm on image 48/2, without change. 1.6 cm low-attenuation lesion with peripheral calcification again seen in the left perirectal region, without change. No evidence of abdominal aortic aneurysm. Aortic atherosclerotic calcification noted. Reproductive: Prior hysterectomy noted. Large amount of ascites shows no significant change. Other:  None. Musculoskeletal:  No suspicious bone lesions identified. IMPRESSION: Marked increase in size of large low-attenuation liver masses, consistent with metastatic disease. Stable partially calcified mild retroperitoneal lymphadenopathy, consistent with metastatic disease. Large amount of ascites, without significant change. New small right pleural effusion and right basilar atelectasis. Resolution of small bowel obstruction since prior study. Electronically Signed   By: Marlaine Hind M.D.   On: 12/20/2020 18:42   MR 3D Recon At Scanner  Result Date: 12/20/2020 CLINICAL DATA:  New onset jaundice and fevers, ovarian carcinoma. EXAM: MRI ABDOMEN WITHOUT AND WITH CONTRAST (INCLUDING MRCP) TECHNIQUE: Multiplanar multisequence MR imaging of the abdomen was performed both before and after the administration of intravenous contrast. Heavily T2-weighted images of the biliary and pancreatic ducts were obtained, and three-dimensional MRCP images were rendered by post  processing. CONTRAST:  7.53mL GADAVIST GADOBUTROL 1 MMOL/ML IV SOLN COMPARISON:  CT 12/20/2020, 11/04/2020 FINDINGS: Lower chest: Small right pleural effusion noted with associated mild right basilar atelectasis. Hepatobiliary: The dominant hepatic metastases noted on remote prior examination have significantly enlarged, as noted on most recent CT exam. Dominant mass within the left hepatic lobe measures 12.1 x 7.5 cm. Dominant mass within the right hepatic lobe protrudes through the liver capsule measuring 7.3 x 5.2 cm. There has developed progressive intrahepatic biliary ductal dilation though the point of obstruction appears to be at the liver hilum, separate from the hepatic metastases. There is cicatricial change with marked narrowing of the central portal venous structures as well. The central common duct is obliterated, though is decompressed in its mid and distal segment. 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 There is, additionally, superimposed periportal edema, best appreciated within segment 8 of the liver, suggesting changes of superimposed inflammation/cholangitis. The gallbladder is mildly distended. Cholelithiasis again noted. Pancreas:  Unremarkable Spleen:  Unremarkable Adrenals/Urinary Tract: Exophytic hepatics mass  demonstrates mass effect upon the right kidney. The kidneys are otherwise unremarkable. The adrenal glands are unremarkable. Stomach/Bowel: Large volume ascites again noted. No evidence of obstruction. Vascular/Lymphatic:  No pathologic adenopathy identified. Other:  None Musculoskeletal: No lytic or blastic bone lesions identified. IMPRESSION: Marked interval progression of disease with enlargement of the dominant hepatic metastases. Interval development of multifocal intrahepatic biliary ductal dilation with areas of a periportal edema in keeping with cholangitis best appreciated within the hepatic  dome. The central ducts in the region of the biliary hilum appear obliterated, though adjacent mass is not identified and there is no significant associated mass effect from the known hepatic metastases suggesting that this represents an intrinsic inflammatory process, such as a secondary, drug induced sclerosing cholangitis. Large volume ascites, similar to prior examination. Small right pleural effusion. Electronically Signed   By: Fidela Salisbury MD   On: 12/20/2020 22:55   MR ABDOMEN MRCP W WO CONTAST  Result Date: 12/20/2020 CLINICAL DATA:  New onset jaundice and fevers, ovarian carcinoma. EXAM: MRI ABDOMEN WITHOUT AND WITH CONTRAST (INCLUDING MRCP) TECHNIQUE: Multiplanar multisequence MR imaging of the abdomen was performed both before and after the administration of intravenous contrast. Heavily T2-weighted images of the biliary and pancreatic ducts were obtained, and three-dimensional MRCP images were rendered by post processing. CONTRAST:  7.85mL GADAVIST GADOBUTROL 1 MMOL/ML IV SOLN COMPARISON:  CT 12/20/2020, 11/04/2020 FINDINGS: Lower chest: Small right pleural effusion noted with associated mild right basilar atelectasis. Hepatobiliary: The dominant hepatic metastases noted on remote prior examination have significantly enlarged, as noted on most recent CT exam. Dominant mass within the left hepatic lobe measures 12.1 x 7.5 cm. Dominant mass within the right hepatic lobe protrudes through the liver capsule measuring 7.3 x 5.2 cm. There has developed progressive intrahepatic biliary ductal dilation though the point of obstruction appears to be at the liver hilum, separate from the hepatic metastases. There is cicatricial change with marked narrowing of the central portal venous structures as well. The central common duct is obliterated, though is decompressed in its mid and distal segment. 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 There is, additionally, superimposed periportal edema, best appreciated within segment 8 of the liver, suggesting changes of superimposed inflammation/cholangitis. The gallbladder is mildly distended. Cholelithiasis again noted. Pancreas:  Unremarkable Spleen:  Unremarkable Adrenals/Urinary Tract: Exophytic hepatics mass demonstrates mass effect upon the right kidney. The kidneys are otherwise unremarkable. The adrenal glands are unremarkable. Stomach/Bowel: Large volume ascites again noted. No evidence of obstruction. Vascular/Lymphatic:  No pathologic adenopathy identified. Other:  None Musculoskeletal: No lytic or blastic bone lesions identified. IMPRESSION: Marked interval progression of disease with enlargement of the dominant hepatic metastases. Interval development of multifocal intrahepatic biliary ductal dilation with areas of a periportal edema in keeping with cholangitis best appreciated within the hepatic dome. The central ducts in the region of the biliary hilum appear obliterated, though adjacent mass is not identified and there is no significant associated mass effect from the known hepatic metastases suggesting that this represents an intrinsic inflammatory process, such as a secondary, drug induced sclerosing cholangitis. Large volume ascites, similar to prior examination. Small right pleural effusion. Electronically Signed   By: Fidela Salisbury MD   On: 12/20/2020 22:55      Subjective: Has great appetite, attributes it to steroids and Reglan started at [facility].  No abdominal pain.  Worried about bilateral leg edema/swelling and heaviness and is asking for some  medications for same.  Very anxious to be discharged today so she can follow-up with her oncology team and palliative team at [facility].  I offered to speak with her family/daughter to update care but she kindly declined this offer.  Discharge Exam:  Vitals:   12/22/20 1759 12/22/20 2019 12/23/20 0036 12/23/20 0517  BP: 117/78  102/66 99/71 (!) 95/56  Pulse: (!) 106 87 63 60  Resp: 18 17 18 18   Temp: 99.6 F (37.6 C) 98.4 F (36.9 C) (!) 97.5 F (36.4 C) 98 F (36.7 C)  TempSrc: Oral Oral    SpO2: 97% 99% 97% 97%    General exam: Young female, moderately built and nourished sitting up comfortably in bed.  Scleral and skin icterus. Respiratory system: Clear to auscultation. Respiratory effort normal. Cardiovascular system: S1 & S2 heard, RRR. No JVD, murmurs, rubs, gallops or clicks.  1+ pitting bilateral leg edema. Gastrointestinal system: Abdomen is moderately distended, soft and nontender. No organomegaly or masses felt. Normal bowel sounds heard. Ascites+ Central nervous system: Alert and oriented. No focal neurological deficits.  No asterixis. Extremities: Symmetric 5 x 5 power. Skin: No rashes, lesions or ulcers Psychiatry: Judgement and insight appear normal. Mood & affect appropriate.     The results of significant diagnostics from this hospitalization (including imaging, microbiology, ancillary and laboratory) are listed below for reference.     Microbiology: Recent Results (from the past 240 hour(s))  Resp Panel by RT-PCR (Flu A&B, Covid) Nasopharyngeal Swab     Status: None   Collection Time: 12/20/20  7:32 PM   Specimen: Nasopharyngeal Swab; Nasopharyngeal(NP) swabs in vial transport medium  Result Value Ref Range Status   SARS Coronavirus 2 by RT PCR NEGATIVE NEGATIVE Final    Comment: (NOTE) SARS-CoV-2 target nucleic acids are NOT DETECTED.  The SARS-CoV-2 RNA is generally detectable in upper respiratory specimens during the acute phase of infection. The lowest concentration of SARS-CoV-2 viral copies this assay can detect is 138 copies/mL. A negative result does not preclude SARS-Cov-2 infection and should not be used as the sole basis for treatment or other patient management decisions. A negative result may occur with  improper specimen collection/handling, submission of specimen  other than nasopharyngeal swab, presence of viral mutation(s) within the areas targeted by this assay, and inadequate number of viral copies(<138 copies/mL). A negative result must be combined with clinical observations, patient history, and epidemiological information. The expected result is Negative.  Fact Sheet for Patients:  EntrepreneurPulse.com.au  Fact Sheet for Healthcare Providers:  IncredibleEmployment.be  This test is no t yet approved or cleared by the [place] FDA and  has been authorized for detection and/or diagnosis of SARS-CoV-2 by FDA under an Emergency Use Authorization (EUA). This EUA will remain  in effect (meaning this test can be used) for the duration of the COVID-19 declaration under Section 564(b)(1) of the Act, 21 U.S.C.section 360bbb-3(b)(1), unless the authorization is terminated  or revoked sooner.       Influenza A by PCR NEGATIVE NEGATIVE Final   Influenza B by PCR NEGATIVE NEGATIVE Final    Comment: (NOTE) The Xpert Xpress SARS-CoV-2/FLU/RSV plus assay is intended as an aid in the diagnosis of influenza from Nasopharyngeal swab specimens and should not be used as a sole basis for treatment. Nasal washings and aspirates are unacceptable for Xpert Xpress SARS-CoV-2/FLU/RSV testing.  Fact Sheet for Patients: EntrepreneurPulse.com.au  Fact Sheet for Healthcare Providers: IncredibleEmployment.be  This test is not yet approved or cleared by the [place]  States FDA and has been authorized for detection and/or diagnosis of SARS-CoV-2 by FDA under an Emergency Use Authorization (EUA). This EUA will remain in effect (meaning this test can be used) for the duration of the COVID-19 declaration under Section 564(b)(1) of the Act, 21 U.S.C. section 360bbb-3(b)(1), unless the authorization is terminated or revoked.  Performed at [facility], [address]  [place], [place] [address]   Culture, blood (routine x 2)     Status: None (Preliminary result)   Collection Time: 12/21/20  3:51 AM   Specimen: BLOOD  Result Value Ref Range Status   Specimen Description BLOOD RIGHT HAND  Final   Special Requests   Final    BOTTLES DRAWN AEROBIC AND ANAEROBIC Blood Culture adequate volume   Culture   Final    NO GROWTH 2 DAYS Performed at [facility], [address] [place], [place] [address]    Report Status PENDING  Incomplete  Culture, blood (routine x 2)     Status: None (Preliminary result)   Collection Time: 12/21/20  3:57 AM   Specimen: BLOOD  Result Value Ref Range Status   Specimen Description BLOOD BLOOD RIGHT WRIST  Final   Special Requests   Final    BOTTLES DRAWN AEROBIC AND ANAEROBIC Blood Culture adequate volume   Culture   Final    NO GROWTH 2 DAYS Performed at [facility], [address] [place], [place] [address]    Report Status PENDING  Incomplete     Labs: CBC: Recent Labs  Lab 12/20/20 1428 12/21/20 0351 12/22/20 0320 12/22/20 1653 12/23/20 0337  WBC 11.0* 10.5 7.0 9.6 6.7  NEUTROABS  --  8.4*  --   --   --   HGB 10.2* 8.5* 7.2* 8.4* 7.7*  HCT 31.7* 26.4* 22.9* 26.1* 24.5*  MCV 91.4 89.8 93.5 90.6 93.2  PLT 292 247 260 275 716    Basic Metabolic Panel: Recent Labs  Lab 12/20/20 1428 12/21/20 0351 12/22/20 0320 12/23/20 0337  NA 130* 132* 132* 132*  K 3.5 3.5 3.6 3.8  CL 94* 100 98 98  CO2 22 24 23 25   GLUCOSE 177* 94 182* 147*  BUN 30* 26* 20 22  CREATININE 0.65 0.41* 0.71 0.79  CALCIUM 8.4* 8.0* 7.8* 7.9*    Liver Function Tests: Recent Labs  Lab 12/20/20 1428 12/21/20 0351 12/22/20 0320 12/23/20 0337  AST 178* 152* 144* 190*  ALT 202* 153* 144* 186*  ALKPHOS 1,338* 1,146* 1,095* 1,314*  BILITOT 9.0* 8.2* 8.2* 8.6*  PROT 7.1 5.6* 4.8* 4.8*  ALBUMIN 1.9* 1.5* 1.2* 1.1*      Time coordinating discharge: 25 minutes  SIGNED:  Vernell Leep, MD, FACP,  [facility]. [facility]  To contact the attending provider between 7A-7P or the covering provider during after hours 7P-7A, please log into the web site [email or web address] and access using universal [place] password for that web site. If you do not have the password, please call the hospital operator.

## 2020-12-25 ENCOUNTER — Inpatient Hospital Stay: Payer: Self-pay

## 2020-12-25 ENCOUNTER — Encounter: Payer: Self-pay | Admitting: Oncology

## 2020-12-25 ENCOUNTER — Inpatient Hospital Stay (HOSPITAL_BASED_OUTPATIENT_CLINIC_OR_DEPARTMENT_OTHER): Payer: Self-pay | Admitting: Oncology

## 2020-12-25 ENCOUNTER — Inpatient Hospital Stay (HOSPITAL_BASED_OUTPATIENT_CLINIC_OR_DEPARTMENT_OTHER): Payer: Self-pay | Admitting: Hospice and Palliative Medicine

## 2020-12-25 VITALS — BP 106/72 | HR 94 | Temp 98.2°F | Resp 20 | Wt 113.0 lb

## 2020-12-25 DIAGNOSIS — Z515 Encounter for palliative care: Secondary | ICD-10-CM

## 2020-12-25 DIAGNOSIS — C569 Malignant neoplasm of unspecified ovary: Secondary | ICD-10-CM

## 2020-12-25 DIAGNOSIS — R17 Unspecified jaundice: Secondary | ICD-10-CM

## 2020-12-25 NOTE — Progress Notes (Signed)
[place]  [phone] [phone]  ID: Amanda Pena OB: 26-Sep-1955  MR#: [id number]  TFT#:[id number]  Patient Care Team: Cassandria Anger, MD as PCP - General (Internal Medicine) Arvella Nigh, MD as Consulting Physician (Obstetrics and Gynecology) Clent Jacks, RN as Oncology Nurse Navigator  CHIEF COMPLAINT: Recurrent platinum resistant ovarian cancer.  INTERVAL HISTORY: Patient returns to clinic today for hospital follow-up and discussion of reinitiating hospice.  The past 1 to 2 weeks patient became acutely jaundiced and was found to have bilirubin greater than 8 as worsening liver enzymes suggestive of hepatic failure.  Subsequent imaging revealed significant progression of disease.  She continues to have a chronic peripheral neuropathy that is unchanged.  She has no other neurologic complaints.  She denies any recent fevers or illnesses. She has a fair appetite denies weight loss.  She has no chest pain, shortness of breath, cough, or hemoptysis.  She denies any abdominal pain.  She denies any nausea, vomiting, constipation, or diarrhea.  She has no urinary complaints.  Patient offers no further specific complaints today.  REVIEW OF SYSTEMS:   Review of Systems  Constitutional: Positive for malaise/fatigue. Negative for fever and weight loss.  Respiratory: Negative.  Negative for cough, hemoptysis and shortness of breath.   Cardiovascular: Negative.  Negative for chest pain and leg swelling.  Gastrointestinal: Negative.  Negative for abdominal pain and nausea.  Genitourinary: Negative.  Negative for dysuria.  Musculoskeletal: Negative.  Negative for back pain.  Skin: Negative.  Negative for rash.  Neurological: Positive for sensory change and weakness. Negative for dizziness, focal weakness and headaches.  Psychiatric/Behavioral: Negative.  The patient is not nervous/anxious.     As per HPI. Otherwise, a complete review of  systems is negative.  PAST MEDICAL HISTORY: Past Medical History:  Diagnosis Date  . Cancer ([place])   . Family history of breast cancer   . Family history of lung cancer   . HYPERLIPIDEMIA 04/09/2008   Qualifier: Diagnosis of  By: Wynona Luna   . HYPOTHYROIDISM 04/09/2008   Qualifier: Diagnosis of  By: Wynona Luna   . INSOMNIA, CHRONIC 08/21/2010   Qualifier: Diagnosis of  By: Wynona Luna   . PERSONAL HISTORY MALIGNANT NEOPLASM THYROID 08/21/2010   Qualifier: Diagnosis of  By: Wynona Luna     PAST SURGICAL HISTORY: Past Surgical History:  Procedure Laterality Date  . IR IMAGING GUIDED PORT INSERTION  07/18/2019  . THYROIDECTOMY, PARTIAL      FAMILY HISTORY: Family History  Problem Relation Age of Onset  . Arthritis Mother   . Hyperlipidemia Mother   . Hyperlipidemia Father   . Diabetes Father   . Hypertension Father   . Heart disease Father 51       CAD  . Lung cancer Father   . Breast cancer Maternal Grandmother        in 49s  . Cancer Maternal Aunt        unk type  . Cancer Maternal Uncle        unk type  . Cancer Paternal Uncle        unk type, d. 8s  . Skin cancer Daughter        basal cell     ADVANCED DIRECTIVES (Y/N):  N  HEALTH MAINTENANCE: Social History   Tobacco Use  . Smoking status: Never Smoker  . Smokeless tobacco: Never Used  Vaping Use  . Vaping Use: Never  used  Substance Use Topics  . Alcohol use: Not Currently  . Drug use: No     Colonoscopy:  PAP:  Bone density:  Lipid panel:  No Known Allergies  Current Outpatient Medications  Medication Sig Dispense Refill  . ALPRAZolam (XANAX) 0.5 MG tablet Take 1 tablet (0.5 mg total) by mouth 2 (two) times daily as needed for anxiety. 60 tablet 2  . amoxicillin-clavulanate (AUGMENTIN) 875-125 MG tablet Take 1 tablet by mouth 2 (two) times daily for 5 days. 10 tablet 0  . dexamethasone (DECADRON) 0.5 MG/5ML solution Take 40 mLs (4 mg total) by mouth 2 (two) times daily.  500 mL 4  . furosemide (LASIX) 20 MG tablet Take 1 tablet (20 mg total) by mouth daily. 30 tablet 0  . gabapentin (NEURONTIN) 300 MG capsule Take 1 capsule (300 mg total) by mouth at bedtime.    Marland Kitchen ibuprofen (ADVIL) 200 MG tablet Take 1-2 tablets (200-400 mg total) by mouth every 8 (eight) hours as needed for fever. For 3-5 days.    . metoCLOPramide (REGLAN) 5 MG/5ML solution Take 5 mLs (5 mg total) by mouth 3 (three) times daily as needed for nausea or vomiting.    Marland Kitchen SYNTHROID 75 MCG tablet Take 1 tablet (75 mcg total) by mouth daily before breakfast. 30 tablet 0   No current facility-administered medications for this visit.    OBJECTIVE: Vitals:   12/25/20 0947  BP: 106/72  Pulse: 94  Resp: 20  Temp: 98.2 F (36.8 C)  SpO2: 97%     Body mass index is 18.24 kg/m.    ECOG FS:0 - Asymptomatic   General: Well-developed, well-nourished, no acute distress.  Sitting in a wheelchair. Eyes: Icteric sclera. HEENT: Normocephalic, moist mucous membranes. Lungs: No audible wheezing or coughing. Heart: Regular rate and rhythm. Abdomen: Soft, nontender, no obvious distention. Musculoskeletal: No edema, cyanosis, or clubbing. Neuro: Alert, answering all questions appropriately. Cranial nerves grossly intact. Skin: Jaundice.  Psych: Normal affect.  LAB RESULTS:  Lab Results  Component Value Date   NA 132 (L) 12/23/2020   K 3.8 12/23/2020   CL 98 12/23/2020   CO2 25 12/23/2020   GLUCOSE 147 (H) 12/23/2020   BUN 22 12/23/2020   CREATININE 0.79 12/23/2020   CALCIUM 7.9 (L) 12/23/2020   PROT 4.8 (L) 12/23/2020   ALBUMIN 1.1 (L) 12/23/2020   AST 190 (H) 12/23/2020   ALT 186 (H) 12/23/2020   ALKPHOS 1,314 (H) 12/23/2020   BILITOT 8.6 (H) 12/23/2020   GFRNONAA >60 12/23/2020   GFRAA >60 01/29/2020    Lab Results  Component Value Date   WBC 6.7 12/23/2020   NEUTROABS 8.4 (H) 12/21/2020   HGB 7.7 (L) 12/23/2020   HCT 24.5 (L) 12/23/2020   MCV 93.2 12/23/2020   PLT 241 12/23/2020      STUDIES: CT ABDOMEN PELVIS WO CONTRAST  Result Date: 12/20/2020 CLINICAL DATA:  New onset jaundice.  Ovarian carcinoma. EXAM: CT ABDOMEN AND PELVIS WITHOUT CONTRAST TECHNIQUE: Multidetector CT imaging of the abdomen and pelvis was performed following the standard protocol without IV contrast. COMPARISON:  11/04/2020 FINDINGS: Lower chest: New small right pleural effusion and right basilar atelectasis. Hepatobiliary: Increased size large low-attenuation masses, largest in the left lobe measuring 11.8 x 7.9 cm, compared to 4.5 x 3.6 cm previously. Pancreas: No mass or inflammatory process visualized on this unenhanced exam. Spleen:  Within normal limits in size. Adrenals/Urinary tract: 2 mm left renal calculus again noted. No evidence of ureteral calculi or  hydronephrosis. Unremarkable unopacified urinary bladder. Stomach/Bowel: There has been resolution of small bowel obstruction since previous study. No focal inflammatory process identified. Vascular/Lymphatic: Mild retroperitoneal lymphadenopathy with calcifications again seen mainly in the aorto-caval region, without significant change. Index lymph node measures 10 mm on image 48/2, without change. 1.6 cm low-attenuation lesion with peripheral calcification again seen in the left perirectal region, without change. No evidence of abdominal aortic aneurysm. Aortic atherosclerotic calcification noted. Reproductive: Prior hysterectomy noted. Large amount of ascites shows no significant change. Other:  None. Musculoskeletal:  No suspicious bone lesions identified. IMPRESSION: Marked increase in size of large low-attenuation liver masses, consistent with metastatic disease. Stable partially calcified mild retroperitoneal lymphadenopathy, consistent with metastatic disease. Large amount of ascites, without significant change. New small right pleural effusion and right basilar atelectasis. Resolution of small bowel obstruction since prior study. Electronically  Signed   By: Marlaine Hind M.D.   On: 12/20/2020 18:42   MR 3D Recon At Scanner  Result Date: 12/20/2020 CLINICAL DATA:  New onset jaundice and fevers, ovarian carcinoma. EXAM: MRI ABDOMEN WITHOUT AND WITH CONTRAST (INCLUDING MRCP) TECHNIQUE: Multiplanar multisequence MR imaging of the abdomen was performed both before and after the administration of intravenous contrast. Heavily T2-weighted images of the biliary and pancreatic ducts were obtained, and three-dimensional MRCP images were rendered by post processing. CONTRAST:  7.75mL GADAVIST GADOBUTROL 1 MMOL/ML IV SOLN COMPARISON:  CT 12/20/2020, 11/04/2020 FINDINGS: Lower chest: Small right pleural effusion noted with associated mild right basilar atelectasis. Hepatobiliary: The dominant hepatic metastases noted on remote prior examination have significantly enlarged, as noted on most recent CT exam. Dominant mass within the left hepatic lobe measures 12.1 x 7.5 cm. Dominant mass within the right hepatic lobe protrudes through the liver capsule measuring 7.3 x 5.2 cm. There has developed progressive intrahepatic biliary ductal dilation though the point of obstruction appears to be at the liver hilum, separate from the hepatic metastases. There is cicatricial change with marked narrowing of the central portal venous structures as well. The central common duct is obliterated, though is decompressed in its mid and distal segment. 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 There is, additionally, superimposed periportal edema, best appreciated within segment 8 of the liver, suggesting changes of superimposed inflammation/cholangitis. The gallbladder is mildly distended. Cholelithiasis again noted. Pancreas:  Unremarkable Spleen:  Unremarkable Adrenals/Urinary Tract: Exophytic hepatics mass demonstrates mass effect upon the right kidney. The kidneys are otherwise unremarkable. The adrenal  glands are unremarkable. Stomach/Bowel: Large volume ascites again noted. No evidence of obstruction. Vascular/Lymphatic:  No pathologic adenopathy identified. Other:  None Musculoskeletal: No lytic or blastic bone lesions identified. IMPRESSION: Marked interval progression of disease with enlargement of the dominant hepatic metastases. Interval development of multifocal intrahepatic biliary ductal dilation with areas of a periportal edema in keeping with cholangitis best appreciated within the hepatic dome. The central ducts in the region of the biliary hilum appear obliterated, though adjacent mass is not identified and there is no significant associated mass effect from the known hepatic metastases suggesting that this represents an intrinsic inflammatory process, such as a secondary, drug induced sclerosing cholangitis. Large volume ascites, similar to prior examination. Small right pleural effusion. Electronically Signed   By: Fidela Salisbury MD   On: 12/20/2020 22:55   MR ABDOMEN MRCP W WO CONTAST  Result Date: 12/20/2020 CLINICAL DATA:  New onset jaundice and fevers, ovarian carcinoma. EXAM: MRI ABDOMEN WITHOUT AND WITH CONTRAST (INCLUDING  MRCP) TECHNIQUE: Multiplanar multisequence MR imaging of the abdomen was performed both before and after the administration of intravenous contrast. Heavily T2-weighted images of the biliary and pancreatic ducts were obtained, and three-dimensional MRCP images were rendered by post processing. CONTRAST:  7.36mL GADAVIST GADOBUTROL 1 MMOL/ML IV SOLN COMPARISON:  CT 12/20/2020, 11/04/2020 FINDINGS: Lower chest: Small right pleural effusion noted with associated mild right basilar atelectasis. Hepatobiliary: The dominant hepatic metastases noted on remote prior examination have significantly enlarged, as noted on most recent CT exam. Dominant mass within the left hepatic lobe measures 12.1 x 7.5 cm. Dominant mass within the right hepatic lobe protrudes through the liver  capsule measuring 7.3 x 5.2 cm. There has developed progressive intrahepatic biliary ductal dilation though the point of obstruction appears to be at the liver hilum, separate from the hepatic metastases. There is cicatricial change with marked narrowing of the central portal venous structures as well. The central common duct is obliterated, though is decompressed in its mid and distal segment. 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 There is, additionally, superimposed periportal edema, best appreciated within segment 8 of the liver, suggesting changes of superimposed inflammation/cholangitis. The gallbladder is mildly distended. Cholelithiasis again noted. Pancreas:  Unremarkable Spleen:  Unremarkable Adrenals/Urinary Tract: Exophytic hepatics mass demonstrates mass effect upon the right kidney. The kidneys are otherwise unremarkable. The adrenal glands are unremarkable. Stomach/Bowel: Large volume ascites again noted. No evidence of obstruction. Vascular/Lymphatic:  No pathologic adenopathy identified. Other:  None Musculoskeletal: No lytic or blastic bone lesions identified. IMPRESSION: Marked interval progression of disease with enlargement of the dominant hepatic metastases. Interval development of multifocal intrahepatic biliary ductal dilation with areas of a periportal edema in keeping with cholangitis best appreciated within the hepatic dome. The central ducts in the region of the biliary hilum appear obliterated, though adjacent mass is not identified and there is no significant associated mass effect from the known hepatic metastases suggesting that this represents an intrinsic inflammatory process, such as a secondary, drug induced sclerosing cholangitis. Large volume ascites, similar to prior examination. Small right pleural effusion. Electronically Signed   By: Fidela Salisbury MD   On: 12/20/2020 22:55    ASSESSMENT:   Recurrent platinum resistant ovarian cancer.  PLAN:   1.  Recurrent platinum resistant ovarian cancer: Patient's CA-125 at [facility] prior to initiating treatment was 352.6.  Patient last received treatment with Taxol and Avastin on October 23, 2020.  Since that time patient's tumor marker has increased to 565.0.  Her most recent imaging with MRI on December 20, 2020 reviewed independently and reported as above with significant progression of disease.  Previously patient has been treated with surgery, carboplatinum and Taxol, liposomal doxorubicin with Avastin plus or minus atezolizumab on clinical trial GY009, and most recently Taxol plus Avastin.  Patient expressed understanding that given her hepatic failure, there are no further treatment options and has agreed to [facility].  No further follow-up has been scheduled.  Appreciate palliative care input.  2.  Neuropathy: Chronic and unchanged.  Continue gabapentin as prescribed.  Acupuncture offered no help.  Appreciate neuro oncology input. 3.  Anemia: Hemoglobin declining and now decreased to 7.7. 4.  Insomnia: Chronic and unchanged.  5.  Abdominal bloating: Secondary to progression of disease.  No further paracentesis are planned.  Paracentesis on September 20, 2020 removed 2.6 L of fluid. 6.  Hyponatremia: Chronic and unchanged.  Patient sodium 132. 7.  Hepatic  failure: Secondary to progression of disease.  Hospice as above.  Patient expressed understanding and was in agreement with this plan. She also understands that She can call clinic at any time with any questions, concerns, or complaints.   Cancer Staging Malignant neoplasm of ovary ([place]) Staging form: Ovary, Fallopian Tube, and Primary Peritoneal Carcinoma, AJCC 8th Edition - Clinical: FIGO Stage IVA (pM1a) - Signed by Lloyd Huger, MD on 07/17/2020   Lloyd Huger, MD   12/25/2020 1:45 PM

## 2020-12-25 NOTE — Progress Notes (Signed)
Amanda Pena  [phone] [phone]   Name: Amanda Pena Date: 12/25/2020 MRN: [id number]  DOB: May 16, 1956  Patient Care Team: Cassandria Anger, MD as PCP - General (Internal Medicine) Arvella Nigh, MD as Consulting Physician (Obstetrics and Gynecology) Clent Jacks, RN as Oncology Nurse Navigator    REASON FOR CONSULTATION: Amanda Pena is a 65 y.o. 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 PMH also notable for CIPN, history of thyroid cancer status post thyroidectomy, anxiety, depression, insomnia, and hyperlipidemia.   Patient is followed locally by [facility]. She has most recently been treated withthird line Taxol/Avastin.  Patient was hospitalized4/10/22 to 4/14/2022with intractable nausea, vomiting and abdominal pain. CT of the abdomen and pelvis was consistent with SBO and progressive liver metastasis and worsening peritoneal disease. Patient's symptoms improved with conservative management including use of gastric decompression via NGT and octreotide/dexamethasone. Patient was able to tolerate a liquid diet at time of discharge but then nausea and vomiting recurredtwodays after discharge. Patient was again readmitted 11/04/2020 to 11/07/2020 with recurrent SBO, presumably of malignant etiology. Patient was again treated conservatively with octreotide, dexamethasone, and metoclopramide.  She was discharged home with hospice but had significant improvement and essentially returned to her baseline.  Patient later revoked hospice services.    Patient was seen by Dr. Grayland Ormond on 12/11/2020 and given her overall improvement, there was a plan  to reinitiate treatment with single agent gemcitabine.   Patient was hospitalized 12/20/2020-12/23/2020 with jaundice and acute cholangitis.  Imaging revealed significant tumor progression in the liver.  Unfortunately, patient was not felt to have any interventional options either by GI or IR.  She was started on antibiotics and discharged home with plan for outpatient oncology/palliative care follow-up.  SOCIAL HISTORY:     reports that she has never smoked. She has never used smokeless tobacco. She reports previous alcohol use. She reports that she does not use drugs.  Patient is married and lives at home with her husband.  She has a daughter.  Patient is active and still plays tennis socially.  ADVANCE DIRECTIVES:  Not on file  CODE STATUS: DNR  PAST MEDICAL HISTORY: Past Medical History:  Diagnosis Date  . Cancer ([place])   . Family history of breast cancer   . Family history of lung cancer   . HYPERLIPIDEMIA 04/09/2008   Qualifier: Diagnosis of  By: Wynona Luna   . HYPOTHYROIDISM 04/09/2008   Qualifier: Diagnosis of  By: Wynona Luna   . INSOMNIA, CHRONIC 08/21/2010   Qualifier: Diagnosis of  By: Wynona Luna   . PERSONAL HISTORY MALIGNANT NEOPLASM THYROID 08/21/2010   Qualifier: Diagnosis of  By: Wynona Luna     PAST SURGICAL HISTORY:  Past Surgical History:  Procedure Laterality Date  . IR IMAGING GUIDED PORT INSERTION  07/18/2019  . THYROIDECTOMY, PARTIAL      HEMATOLOGY/ONCOLOGY HISTORY:  Oncology History Overview Note  #November 2020-omental caking/peritoneal carcinomatosis; bilateral adnexal masses 4-5 cm in size; multiple lung nodules;   # dec 8th 2020-omental biopsy; positive for high-grade serous adenocarcinoma; GYN origin.  CT scan chest-bilateral lung nodules; 3.8 cm soft tissue mass adjacent to GE junction  # DEC 11TH 2020-CARBO-Taxol [chemo consent] x 3 cycles; February  2021-improvement of the peritoneal carcinomatosis; lung lesions.. 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  Partial colectomy  # #Genetic counseling-s/p NEG myRiskMyriad.   # Thyroid cancer at 70y s/p thyroidectomy [no RAIU]  # NGS/MOLECULAR TESTS: My risk myriad negative; HRD negative; NGS-P  # PALLIATIVE CARE EVALUATION:P  # PAIN MANAGEMENT: P   DIAGNOSIS: Bilateral ovarian cancer/tube/peritoneal  STAGE: IV   ;  GOALS: Control  CURRENT/MOST RECENT THERAPY : Carbotaxol [C]    Peritoneal carcinoma ([place])  06/23/2019 Initial Diagnosis   Peritoneal carcinoma ([facility])   06/30/2019 - 11/17/2019 Chemotherapy   The patient had dexamethasone (DECADRON) 4 MG tablet, 8 mg, Oral, Daily, 1 of 1 cycle, Start date: --, End date: -- palonosetron (ALOXI) injection 0.25 mg, 0.25 mg, Intravenous,  Once, 6 of 6 cycles Administration: 0.25 mg (06/30/2019), 0.25 mg (07/24/2019), 0.25 mg (08/14/2019), 0.25 mg (10/06/2019), 0.25 mg (10/27/2019), 0.25 mg (11/17/2019) pegfilgrastim (NEULASTA ONPRO KIT) injection 6 mg, 6 mg, Subcutaneous, Once, 1 of 1 cycle Administration: 6 mg (11/17/2019) CARBOplatin (PARAPLATIN) 570 mg in sodium chloride 0.9 % 250 mL chemo infusion, 570 mg (100 % of original dose 567 mg), Intravenous,  Once, 6 of 6 cycles Dose modification:   (original dose 567 mg, Cycle 1) Administration: 570 mg (06/30/2019), 570 mg (07/24/2019), 570 mg (08/14/2019), 520 mg (10/27/2019), 520 mg (11/17/2019) fosaprepitant (EMEND) 150 mg in sodium chloride 0.9 % 145 mL IVPB, 150 mg, Intravenous,  Once, 1 of 1 cycle PACLitaxel (TAXOL) 288 mg in sodium chloride 0.9 % 250 mL chemo infusion (> 63m/m2), 175 mg/m2 = 288 mg, Intravenous,  Once, 6 of 6 cycles Administration: 288 mg (06/30/2019), 288 mg (07/24/2019), 288 mg (08/14/2019), 288 mg (10/06/2019), 288 mg (10/27/2019), 288 mg (11/17/2019) fosaprepitant (EMEND) 150 mg, dexamethasone (DECADRON) 12 mg in sodium chloride 0.9 % 145 mL IVPB, , Intravenous,   Once, 5 of 5 cycles Administration:  (07/24/2019),  (08/14/2019),  (10/06/2019),  (10/27/2019),  (11/17/2019)  for chemotherapy treatment.     Genetic Testing   Negative genetic testing. No pathogenic variants identified on the [employer] MyRisk+HRD. The report date is 10/02/2019.  The [facility] panel offered by [employer] sequencing and deletion/duplication testing of the following 35 genes: APC, ATM, AXIN2, BARD1, BMPR1A, BRCA1, BRCA2, BRIP1, CHD1, CDK4, CDKN2A, CHEK2, EPCAM (large rearrangement only), HOXB13, GALNT12, MLH1, MSH2, MSH3, MSH6, MUTYH, NBN, NTHL1, PALB2, PMS2, PTEN, RAD51C, RAD51D, RNF43, RPS20, SMAD4, STK11, and TP53. Sequencing was performed for select regions of POLE and POLD1, and large rearrangement analysis was performed for select regions of GREM1.    12/19/2019 - 12/19/2019 Chemotherapy   The patient had DOXOrubicin HCL LIPOSOMAL (DOXIL) 64 mg in dextrose 5 % 250 mL chemo infusion, 40 mg/m2, Intravenous,  Once, 0 of 6 cycles bevacizumab-bvzr (ZIRABEV) 500 mg in sodium chloride 0.9 % 100 mL chemo infusion, 10 mg/kg, Intravenous,  Once, 0 of 6 cycles  for chemotherapy treatment.    07/17/2020 -  Chemotherapy    Patient is on Treatment Plan: OVARIAN PACLITAXEL  DU1,3,24,40+ BEVACIZUMAB D1,15 Q28D      Malignant neoplasm of ovary ([facility]  06/23/2019 Initial Diagnosis   Malignant neoplasm of ovary ([facility]   12/19/2019 - 12/19/2019 Chemotherapy   The patient had DOXOrubicin HCL LIPOSOMAL (DOXIL) 64 mg in dextrose 5 % 250 mL chemo infusion, 40 mg/m2, Intravenous,  Once, 0 of 6 cycles bevacizumab-bvzr (ZIRABEV) 500 mg in sodium chloride 0.9 % 100 mL chemo infusion, 10 mg/kg, Intravenous,  Once, 0 of 6 cycles  for chemotherapy treatment.    07/17/2020 -  Chemotherapy    Patient is on Treatment Plan: OVARIAN PACLITAXEL  T3,4,28,76 + BEVACIZUMAB D1,15 Q28D      07/17/2020 Cancer Staging   Staging form: Ovary, Fallopian Tube, and Primary Peritoneal Carcinoma, AJCC  8th Edition - Clinical: FIGO Stage IVA (pM1a) - Signed by Lloyd Huger, MD on 07/17/2020   Peritoneal carcinomatosis ([place])  12/19/2019 - 12/19/2019 Chemotherapy   The patient had DOXOrubicin HCL LIPOSOMAL (DOXIL) 64 mg in dextrose 5 % 250 mL chemo infusion, 40 mg/m2, Intravenous,  Once, 0 of 6 cycles bevacizumab-bvzr (ZIRABEV) 500 mg in sodium chloride 0.9 % 100 mL chemo infusion, 10 mg/kg, Intravenous,  Once, 0 of 6 cycles  for chemotherapy treatment.    12/19/2019 Initial Diagnosis   Peritoneal carcinomatosis ([place])   07/17/2020 -  Chemotherapy   The patient had PACLitaxel (TAXOL) 126 mg in sodium chloride 0.9 % 250 mL chemo infusion (</= 61m/m2), 80 mg/m2 = 126 mg, Intravenous,  Once, 0 of 4 cycles bevacizumab-bvzr (ZIRABEV) 500 mg in sodium chloride 0.9 % 100 mL chemo infusion, 10 mg/kg = 500 mg, Intravenous,  Once, 0 of 4 cycles  for chemotherapy treatment.    07/17/2020 -  Chemotherapy    Patient is on Treatment Plan: OVARIAN PACLITAXEL  DO1,1,57,26+ BEVACIZUMAB D1,15 Q28D        ALLERGIES:  has No Known Allergies.  MEDICATIONS:  Current Outpatient Medications  Medication Sig Dispense Refill  . ALPRAZolam (XANAX) 0.5 MG tablet Take 1 tablet (0.5 mg total) by mouth 2 (two) times daily as needed for anxiety. 60 tablet 2  . amoxicillin-clavulanate (AUGMENTIN) 875-125 MG tablet Take 1 tablet by mouth 2 (two) times daily for 5 days. 10 tablet 0  . dexamethasone (DECADRON) 0.5 MG/5ML solution Take 40 mLs (4 mg total) by mouth 2 (two) times daily. 500 mL 4  . furosemide (LASIX) 20 MG tablet Take 1 tablet (20 mg total) by mouth daily. 30 tablet 0  . gabapentin (NEURONTIN) 300 MG capsule Take 1 capsule (300 mg total) by mouth at bedtime.    .Marland Kitchenibuprofen (ADVIL) 200 MG tablet Take 1-2 tablets (200-400 mg total) by mouth every 8 (eight) hours as needed for fever. For 3-5 days.    . metoCLOPramide (REGLAN) 5 MG/5ML solution Take 5 mLs (5 mg total) by mouth 3 (three) times daily as  needed for nausea or vomiting.    .Marland KitchenSYNTHROID 75 MCG tablet Take 1 tablet (75 mcg total) by mouth daily before breakfast. 30 tablet 0   No current facility-administered medications for this visit.    VITAL SIGNS: There were no vitals taken for this visit. There were no vitals filed for this visit.  Estimated body mass index is 18.24 kg/m as calculated from the following:   Height as of 12/20/20: _0  (1.676 m).   Weight as of an earlier encounter on 12/25/20: 113 lb (51.3 kg).  LABS: CBC:    Component Value Date/Time   WBC 6.7 12/23/2020 0337   HGB 7.7 (L) 12/23/2020 0337   HCT 24.5 (L) 12/23/2020 0337   PLT 241 12/23/2020 0337   MCV 93.2 12/23/2020 0337   NEUTROABS 8.4 (H) 12/21/2020 0351   LYMPHSABS 1.0 12/21/2020 0351   MONOABS 0.5 12/21/2020 0351   EOSABS 0.0 12/21/2020 0351   BASOSABS 0.0 12/21/2020 0351   Comprehensive Metabolic Panel:    Component Value Date/Time   NA 132 (L) 12/23/2020 02035  K 3.8 12/23/2020 0337   CL 98 12/23/2020 0337   CO2 25 12/23/2020 0337   BUN 22 12/23/2020 0337   CREATININE 0.79 12/23/2020 0337   CREATININE 0.78 03/08/2014 0822   GLUCOSE 147 (H) 12/23/2020 0337   CALCIUM 7.9 (L) 12/23/2020 0337   AST 190 (H) 12/23/2020 0337   ALT 186 (H) 12/23/2020 0337   ALKPHOS 1,314 (H) 12/23/2020 0337   BILITOT 8.6 (H) 12/23/2020 0337   PROT 4.8 (L) 12/23/2020 0337   ALBUMIN 1.1 (L) 12/23/2020 0337    RADIOGRAPHIC STUDIES: CT ABDOMEN PELVIS WO CONTRAST  Result Date: 12/20/2020 CLINICAL DATA:  New onset jaundice.  Ovarian carcinoma. EXAM: CT ABDOMEN AND PELVIS WITHOUT CONTRAST TECHNIQUE: Multidetector CT imaging of the abdomen and pelvis was performed following the standard protocol without IV contrast. COMPARISON:  11/04/2020 FINDINGS: Lower chest: New small right pleural effusion and right basilar atelectasis. Hepatobiliary: Increased size large low-attenuation masses, largest in the left lobe measuring 11.8 x 7.9 cm, compared to 4.5 x 3.6 cm  previously. Pancreas: No mass or inflammatory process visualized on this unenhanced exam. Spleen:  Within normal limits in size. Adrenals/Urinary tract: 2 mm left renal calculus again noted. No evidence of ureteral calculi or hydronephrosis. Unremarkable unopacified urinary bladder. Stomach/Bowel: There has been resolution of small bowel obstruction since previous study. No focal inflammatory process identified. Vascular/Lymphatic: Mild retroperitoneal lymphadenopathy with calcifications again seen mainly in the aorto-caval region, without significant change. Index lymph node measures 10 mm on image 48/2, without change. 1.6 cm low-attenuation lesion with peripheral calcification again seen in the left perirectal region, without change. No evidence of abdominal aortic aneurysm. Aortic atherosclerotic calcification noted. Reproductive: Prior hysterectomy noted. Large amount of ascites shows no significant change. Other:  None. Musculoskeletal:  No suspicious bone lesions identified. IMPRESSION: Marked increase in size of large low-attenuation liver masses, consistent with metastatic disease. Stable partially calcified mild retroperitoneal lymphadenopathy, consistent with metastatic disease. Large amount of ascites, without significant change. New small right pleural effusion and right basilar atelectasis. Resolution of small bowel obstruction since prior study. Electronically Signed   By: Marlaine Hind M.D.   On: 12/20/2020 18:42   MR 3D Recon At Scanner  Result Date: 12/20/2020 CLINICAL DATA:  New onset jaundice and fevers, ovarian carcinoma. EXAM: MRI ABDOMEN WITHOUT AND WITH CONTRAST (INCLUDING MRCP) TECHNIQUE: Multiplanar multisequence MR imaging of the abdomen was performed both before and after the administration of intravenous contrast. Heavily T2-weighted images of the biliary and pancreatic ducts were obtained, and three-dimensional MRCP images were rendered by post processing. CONTRAST:  7.43m GADAVIST  GADOBUTROL 1 MMOL/ML IV SOLN COMPARISON:  CT 12/20/2020, 11/04/2020 FINDINGS: Lower chest: Small right pleural effusion noted with associated mild right basilar atelectasis. Hepatobiliary: The dominant hepatic metastases noted on remote prior examination have significantly enlarged, as noted on most recent CT exam. Dominant mass within the left hepatic lobe measures 12.1 x 7.5 cm. Dominant mass within the right hepatic lobe protrudes through the liver capsule measuring 7.3 x 5.2 cm. There has developed progressive intrahepatic biliary ductal dilation though the point of obstruction appears to be at the liver hilum, separate from the hepatic metastases. There is cicatricial change with marked narrowing of the central portal venous structures as well. The central common duct is obliterated, though is decompressed in its mid and distal segment. 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 There is, additionally, superimposed periportal edema, best appreciated within segment 8 of  the liver, suggesting changes of superimposed inflammation/cholangitis. The gallbladder is mildly distended. Cholelithiasis again noted. Pancreas:  Unremarkable Spleen:  Unremarkable Adrenals/Urinary Tract: Exophytic hepatics mass demonstrates mass effect upon the right kidney. The kidneys are otherwise unremarkable. The adrenal glands are unremarkable. Stomach/Bowel: Large volume ascites again noted. No evidence of obstruction. Vascular/Lymphatic:  No pathologic adenopathy identified. Other:  None Musculoskeletal: No lytic or blastic bone lesions identified. IMPRESSION: Marked interval progression of disease with enlargement of the dominant hepatic metastases. Interval development of multifocal intrahepatic biliary ductal dilation with areas of a periportal edema in keeping with cholangitis best appreciated within the hepatic dome. The central ducts in the region of  the biliary hilum appear obliterated, though adjacent mass is not identified and there is no significant associated mass effect from the known hepatic metastases suggesting that this represents an intrinsic inflammatory process, such as a secondary, drug induced sclerosing cholangitis. Large volume ascites, similar to prior examination. Small right pleural effusion. Electronically Signed   By: Fidela Salisbury MD   On: 12/20/2020 22:55   MR ABDOMEN MRCP W WO CONTAST  Result Date: 12/20/2020 CLINICAL DATA:  New onset jaundice and fevers, ovarian carcinoma. EXAM: MRI ABDOMEN WITHOUT AND WITH CONTRAST (INCLUDING MRCP) TECHNIQUE: Multiplanar multisequence MR imaging of the abdomen was performed both before and after the administration of intravenous contrast. Heavily T2-weighted images of the biliary and pancreatic ducts were obtained, and three-dimensional MRCP images were rendered by post processing. CONTRAST:  7.102m GADAVIST GADOBUTROL 1 MMOL/ML IV SOLN COMPARISON:  CT 12/20/2020, 11/04/2020 FINDINGS: Lower chest: Small right pleural effusion noted with associated mild right basilar atelectasis. Hepatobiliary: The dominant hepatic metastases noted on remote prior examination have significantly enlarged, as noted on most recent CT exam. Dominant mass within the left hepatic lobe measures 12.1 x 7.5 cm. Dominant mass within the right hepatic lobe protrudes through the liver capsule measuring 7.3 x 5.2 cm. There has developed progressive intrahepatic biliary ductal dilation though the point of obstruction appears to be at the liver hilum, separate from the hepatic metastases. There is cicatricial change with marked narrowing of the central portal venous structures as well. The central common duct is obliterated, though is decompressed in its mid and distal segment. 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 There is, additionally,  superimposed periportal edema, best appreciated within segment 8 of the liver, suggesting changes of superimposed inflammation/cholangitis. The gallbladder is mildly distended. Cholelithiasis again noted. Pancreas:  Unremarkable Spleen:  Unremarkable Adrenals/Urinary Tract: Exophytic hepatics mass demonstrates mass effect upon the right kidney. The kidneys are otherwise unremarkable. The adrenal glands are unremarkable. Stomach/Bowel: Large volume ascites again noted. No evidence of obstruction. Vascular/Lymphatic:  No pathologic adenopathy identified. Other:  None Musculoskeletal: No lytic or blastic bone lesions identified. IMPRESSION: Marked interval progression of disease with enlargement of the dominant hepatic metastases. Interval development of multifocal intrahepatic biliary ductal dilation with areas of a periportal edema in keeping with cholangitis best appreciated within the hepatic dome. The central ducts in the region of the biliary hilum appear obliterated, though adjacent mass is not identified and there is no significant associated mass effect from the known hepatic metastases suggesting that this represents an intrinsic inflammatory process, such as a secondary, drug induced sclerosing cholangitis. Large volume ascites, similar to prior examination. Small right pleural effusion. Electronically Signed   By: AFidela SalisburyMD   On: 12/20/2020 22:55    PERFORMANCE STATUS (ECOG) : 1 -  Symptomatic but completely ambulatory  Review of Systems Unless otherwise noted, a complete review of systems is negative.  Physical Exam General: NAD Pulmonary: Unlabored Skin: no rashes Neurological: Grossly nonfocal  IMPRESSION: Patient and daughter seen following their visit with Dr. Grayland Ormond.  Patient is not felt to have any viable treatment options at this point for her cancer.  Patient says that she recognizes that she is nearing end-of-life.  Her goal is to focus on comfort and quality of life at  home.  We talked extensively about the option of hospice and both patient and daughter verbalized agreement with their involvement. I have talked with hospice referral center and gave an order for hospice services at home. I have also talked several times with the hospice Chief Medical Officer regarding patient's care and the specifics of her previously being billed given her insurance status.  I have received assurances that hospice is willing to provide her care at home despite her being uninsured.  Fortunately, patient does not endorse to significant of a symptom burden today.  She has some bilateral lower extremity edema and was started on furosemide 20 mg daily in the hospital.  Okay to increase Lasix to 40 mg daily.  However, it is possible that her edema is not going to be responsive to diuretics as likely secondary to her hepatic dysfunction +/- steroids. Would conservatively diurese her given risk of dehydration.  Discussed with Dr. Grayland Ormond.  PLAN: -Best supportive care -Referral for hospice services at home -DNR/DNI -RTC as needed  Case and plan discussed with Dr. Grayland Ormond  Patient expressed understanding and was in agreement with this plan. She also understands that She can call the clinic at any time with any questions, concerns, or complaints.     Time Total: 25 minutes  Visit consisted of counseling and education dealing with the complex and emotionally intense issues of symptom management and palliative care in the setting of serious and potentially life-threatening illness.Greater than 50%  of this time was spent counseling and coordinating care related to the above assessment and plan.  Signed by: Altha Harm, PhD, NP-C

## 2020-12-26 LAB — CULTURE, BLOOD (ROUTINE X 2)
Culture: NO GROWTH
Culture: NO GROWTH
Special Requests: ADEQUATE
Special Requests: ADEQUATE

## 2020-12-27 ENCOUNTER — Telehealth: Payer: Self-pay | Admitting: Hospice and Palliative Medicine

## 2020-12-27 NOTE — Telephone Encounter (Signed)
I called and spoke with patient. She reports increased fatigue but denies other changes. Hospice started following her this week at home and she says she is comfortable with the plan. No issues with meds nor need for refills. I encouraged her to call hospice or [place] with any changes/concerns.

## 2021-01-08 ENCOUNTER — Other Ambulatory Visit: Payer: Self-pay

## 2021-01-09 ENCOUNTER — Ambulatory Visit: Payer: Self-pay | Admitting: Oncology

## 2021-01-17 DEATH — deceased

## 2021-01-29 ENCOUNTER — Inpatient Hospital Stay: Payer: Self-pay

## 2021-03-12 ENCOUNTER — Inpatient Hospital Stay: Payer: Self-pay

## 2021-04-23 ENCOUNTER — Inpatient Hospital Stay: Payer: Self-pay

## 2021-06-04 ENCOUNTER — Inpatient Hospital Stay: Payer: Self-pay

## 2021-07-16 ENCOUNTER — Inpatient Hospital Stay: Payer: Self-pay

## 2021-09-12 NOTE — Telephone Encounter (Signed)
Signing encounter see previous note on 09/17/20

## 2022-04-02 ENCOUNTER — Other Ambulatory Visit (HOSPITAL_BASED_OUTPATIENT_CLINIC_OR_DEPARTMENT_OTHER): Payer: Self-pay
